# Patient Record
Sex: Male | Born: 1950 | Race: Black or African American | Hispanic: No | Smoking: Former smoker
Health system: Southern US, Community
[De-identification: ages and names within clinical notes are randomized; demographics above are authoritative.]

## PROBLEM LIST (undated history)

## (undated) DIAGNOSIS — M6282 Rhabdomyolysis: Secondary | ICD-10-CM

## (undated) DIAGNOSIS — S069X9A Unspecified intracranial injury with loss of consciousness of unspecified duration, initial encounter: Secondary | ICD-10-CM

## (undated) DIAGNOSIS — R55 Syncope and collapse: Secondary | ICD-10-CM

## (undated) DIAGNOSIS — I5189 Other ill-defined heart diseases: Secondary | ICD-10-CM

## (undated) DIAGNOSIS — I214 Non-ST elevation (NSTEMI) myocardial infarction: Secondary | ICD-10-CM

## (undated) DIAGNOSIS — S069XAA Unspecified intracranial injury with loss of consciousness status unknown, initial encounter: Secondary | ICD-10-CM

## (undated) DIAGNOSIS — E119 Type 2 diabetes mellitus without complications: Secondary | ICD-10-CM

## (undated) DIAGNOSIS — R569 Unspecified convulsions: Secondary | ICD-10-CM

## (undated) HISTORY — PX: CRANIOTOMY: SHX93

---

## 1998-06-08 ENCOUNTER — Emergency Department (HOSPITAL_COMMUNITY): Admission: EM | Admit: 1998-06-08 | Discharge: 1998-06-08 | Payer: Self-pay | Admitting: Emergency Medicine

## 1998-06-08 ENCOUNTER — Encounter: Payer: Self-pay | Admitting: Emergency Medicine

## 1998-08-02 ENCOUNTER — Encounter: Payer: Self-pay | Admitting: Emergency Medicine

## 1998-08-02 ENCOUNTER — Emergency Department (HOSPITAL_COMMUNITY): Admission: EM | Admit: 1998-08-02 | Discharge: 1998-08-02 | Payer: Self-pay | Admitting: Emergency Medicine

## 1998-09-12 ENCOUNTER — Emergency Department (HOSPITAL_COMMUNITY): Admission: EM | Admit: 1998-09-12 | Discharge: 1998-09-12 | Payer: Self-pay | Admitting: Emergency Medicine

## 1998-11-22 ENCOUNTER — Inpatient Hospital Stay (HOSPITAL_COMMUNITY): Admission: EM | Admit: 1998-11-22 | Discharge: 1999-01-15 | Payer: Self-pay | Admitting: Emergency Medicine

## 1998-11-22 ENCOUNTER — Encounter: Payer: Self-pay | Admitting: Emergency Medicine

## 1998-11-23 ENCOUNTER — Encounter: Payer: Self-pay | Admitting: Neurosurgery

## 1998-11-24 ENCOUNTER — Encounter: Payer: Self-pay | Admitting: Pulmonary Disease

## 1998-11-24 ENCOUNTER — Encounter: Payer: Self-pay | Admitting: Neurosurgery

## 1998-11-25 ENCOUNTER — Encounter: Payer: Self-pay | Admitting: Pulmonary Disease

## 1998-11-26 ENCOUNTER — Encounter: Payer: Self-pay | Admitting: Neurosurgery

## 1998-11-28 ENCOUNTER — Encounter: Payer: Self-pay | Admitting: Neurosurgery

## 1998-12-02 ENCOUNTER — Encounter: Payer: Self-pay | Admitting: Neurosurgery

## 1998-12-04 ENCOUNTER — Encounter: Payer: Self-pay | Admitting: Neurosurgery

## 1998-12-04 ENCOUNTER — Encounter: Payer: Self-pay | Admitting: Neurological Surgery

## 1998-12-04 ENCOUNTER — Encounter: Payer: Self-pay | Admitting: Pulmonary Disease

## 1998-12-05 ENCOUNTER — Encounter: Payer: Self-pay | Admitting: Pulmonary Disease

## 1998-12-05 ENCOUNTER — Encounter: Payer: Self-pay | Admitting: Neurosurgery

## 1998-12-06 ENCOUNTER — Encounter: Payer: Self-pay | Admitting: Critical Care Medicine

## 1998-12-07 ENCOUNTER — Encounter: Payer: Self-pay | Admitting: Critical Care Medicine

## 1998-12-08 ENCOUNTER — Encounter: Payer: Self-pay | Admitting: Neurosurgery

## 1998-12-09 ENCOUNTER — Encounter: Payer: Self-pay | Admitting: Pulmonary Disease

## 1998-12-11 ENCOUNTER — Encounter: Payer: Self-pay | Admitting: Critical Care Medicine

## 1998-12-17 ENCOUNTER — Encounter: Payer: Self-pay | Admitting: Neurosurgery

## 1998-12-25 ENCOUNTER — Encounter: Payer: Self-pay | Admitting: General Surgery

## 1999-01-04 ENCOUNTER — Encounter: Payer: Self-pay | Admitting: General Surgery

## 1999-01-08 ENCOUNTER — Encounter: Payer: Self-pay | Admitting: Neurosurgery

## 1999-01-10 ENCOUNTER — Encounter: Payer: Self-pay | Admitting: Neurosurgery

## 1999-01-15 ENCOUNTER — Inpatient Hospital Stay (HOSPITAL_COMMUNITY)
Admission: RE | Admit: 1999-01-15 | Discharge: 1999-03-05 | Payer: Self-pay | Admitting: Physical Medicine and Rehabilitation

## 1999-01-16 ENCOUNTER — Encounter: Payer: Self-pay | Admitting: Physical Medicine and Rehabilitation

## 1999-02-10 ENCOUNTER — Encounter: Payer: Self-pay | Admitting: Physical Medicine and Rehabilitation

## 1999-03-06 ENCOUNTER — Encounter
Admission: RE | Admit: 1999-03-06 | Discharge: 1999-05-02 | Payer: Self-pay | Admitting: Physical Medicine and Rehabilitation

## 1999-03-25 ENCOUNTER — Ambulatory Visit (HOSPITAL_COMMUNITY): Admission: RE | Admit: 1999-03-25 | Discharge: 1999-03-25 | Payer: Self-pay | Admitting: Family Medicine

## 1999-04-12 ENCOUNTER — Emergency Department (HOSPITAL_COMMUNITY): Admission: EM | Admit: 1999-04-12 | Discharge: 1999-04-12 | Payer: Self-pay | Admitting: Emergency Medicine

## 1999-05-01 ENCOUNTER — Encounter: Admission: RE | Admit: 1999-05-01 | Discharge: 1999-05-01 | Payer: Self-pay | Admitting: Neurosurgery

## 1999-05-11 ENCOUNTER — Encounter: Payer: Self-pay | Admitting: Emergency Medicine

## 1999-05-11 ENCOUNTER — Inpatient Hospital Stay (HOSPITAL_COMMUNITY): Admission: EM | Admit: 1999-05-11 | Discharge: 1999-05-14 | Payer: Self-pay | Admitting: Emergency Medicine

## 1999-05-12 ENCOUNTER — Encounter: Payer: Self-pay | Admitting: Family Medicine

## 1999-05-15 ENCOUNTER — Encounter: Admission: RE | Admit: 1999-05-15 | Discharge: 1999-05-15 | Payer: Self-pay | Admitting: Family Medicine

## 1999-06-15 ENCOUNTER — Encounter: Payer: Self-pay | Admitting: Emergency Medicine

## 1999-06-15 ENCOUNTER — Observation Stay (HOSPITAL_COMMUNITY): Admission: EM | Admit: 1999-06-15 | Discharge: 1999-06-16 | Payer: Self-pay | Admitting: Emergency Medicine

## 1999-07-02 ENCOUNTER — Encounter: Payer: Self-pay | Admitting: Family Medicine

## 1999-07-02 ENCOUNTER — Ambulatory Visit (HOSPITAL_COMMUNITY): Admission: RE | Admit: 1999-07-02 | Discharge: 1999-07-02 | Payer: Self-pay | Admitting: Family Medicine

## 2000-03-01 ENCOUNTER — Emergency Department (HOSPITAL_COMMUNITY): Admission: EM | Admit: 2000-03-01 | Discharge: 2000-03-01 | Payer: Self-pay | Admitting: Emergency Medicine

## 2000-03-01 ENCOUNTER — Encounter: Payer: Self-pay | Admitting: Emergency Medicine

## 2000-12-09 ENCOUNTER — Encounter: Payer: Self-pay | Admitting: Emergency Medicine

## 2000-12-09 ENCOUNTER — Emergency Department (HOSPITAL_COMMUNITY): Admission: EM | Admit: 2000-12-09 | Discharge: 2000-12-09 | Payer: Self-pay | Admitting: Emergency Medicine

## 2001-02-26 ENCOUNTER — Inpatient Hospital Stay (HOSPITAL_COMMUNITY): Admission: EM | Admit: 2001-02-26 | Discharge: 2001-03-09 | Payer: Self-pay | Admitting: Emergency Medicine

## 2001-03-03 ENCOUNTER — Encounter: Payer: Self-pay | Admitting: Nephrology

## 2001-03-07 ENCOUNTER — Encounter: Payer: Self-pay | Admitting: Nephrology

## 2001-05-18 ENCOUNTER — Inpatient Hospital Stay (HOSPITAL_COMMUNITY): Admission: EM | Admit: 2001-05-18 | Discharge: 2001-05-21 | Payer: Self-pay | Admitting: Emergency Medicine

## 2001-05-18 ENCOUNTER — Encounter: Payer: Self-pay | Admitting: Pediatrics

## 2001-05-18 ENCOUNTER — Encounter: Payer: Self-pay | Admitting: Emergency Medicine

## 2001-06-21 ENCOUNTER — Emergency Department (HOSPITAL_COMMUNITY): Admission: EM | Admit: 2001-06-21 | Discharge: 2001-06-21 | Payer: Self-pay

## 2001-08-03 ENCOUNTER — Inpatient Hospital Stay (HOSPITAL_COMMUNITY): Admission: EM | Admit: 2001-08-03 | Discharge: 2001-08-11 | Payer: Self-pay | Admitting: *Deleted

## 2001-08-03 ENCOUNTER — Encounter: Payer: Self-pay | Admitting: *Deleted

## 2001-08-04 ENCOUNTER — Encounter: Payer: Self-pay | Admitting: Neurology

## 2001-08-05 ENCOUNTER — Encounter: Payer: Self-pay | Admitting: Neurology

## 2001-08-09 ENCOUNTER — Encounter: Payer: Self-pay | Admitting: Nephrology

## 2001-09-20 ENCOUNTER — Emergency Department (HOSPITAL_COMMUNITY): Admission: EM | Admit: 2001-09-20 | Discharge: 2001-09-21 | Payer: Self-pay | Admitting: Emergency Medicine

## 2001-09-27 ENCOUNTER — Encounter: Payer: Self-pay | Admitting: Emergency Medicine

## 2001-09-27 ENCOUNTER — Emergency Department (HOSPITAL_COMMUNITY): Admission: EM | Admit: 2001-09-27 | Discharge: 2001-09-27 | Payer: Self-pay | Admitting: Emergency Medicine

## 2002-04-05 ENCOUNTER — Inpatient Hospital Stay (HOSPITAL_COMMUNITY): Admission: EM | Admit: 2002-04-05 | Discharge: 2002-04-12 | Payer: Self-pay | Admitting: Emergency Medicine

## 2002-04-05 ENCOUNTER — Encounter: Payer: Self-pay | Admitting: Emergency Medicine

## 2002-04-06 ENCOUNTER — Encounter: Payer: Self-pay | Admitting: Nephrology

## 2003-02-18 ENCOUNTER — Emergency Department (HOSPITAL_COMMUNITY): Admission: EM | Admit: 2003-02-18 | Discharge: 2003-02-18 | Payer: Self-pay | Admitting: Emergency Medicine

## 2003-02-18 ENCOUNTER — Encounter: Payer: Self-pay | Admitting: Emergency Medicine

## 2005-10-09 ENCOUNTER — Emergency Department (HOSPITAL_COMMUNITY): Admission: EM | Admit: 2005-10-09 | Discharge: 2005-10-09 | Payer: Self-pay | Admitting: Emergency Medicine

## 2005-12-31 ENCOUNTER — Emergency Department (HOSPITAL_COMMUNITY): Admission: EM | Admit: 2005-12-31 | Discharge: 2005-12-31 | Payer: Self-pay | Admitting: Emergency Medicine

## 2006-06-23 ENCOUNTER — Emergency Department (HOSPITAL_COMMUNITY): Admission: EM | Admit: 2006-06-23 | Discharge: 2006-06-24 | Payer: Self-pay | Admitting: Emergency Medicine

## 2006-07-01 ENCOUNTER — Ambulatory Visit (HOSPITAL_BASED_OUTPATIENT_CLINIC_OR_DEPARTMENT_OTHER): Admission: RE | Admit: 2006-07-01 | Discharge: 2006-07-01 | Payer: Self-pay | Admitting: Orthopedic Surgery

## 2006-09-30 ENCOUNTER — Encounter: Admission: RE | Admit: 2006-09-30 | Discharge: 2006-12-16 | Payer: Self-pay | Admitting: Orthopedic Surgery

## 2007-03-04 ENCOUNTER — Inpatient Hospital Stay (HOSPITAL_COMMUNITY): Admission: EM | Admit: 2007-03-04 | Discharge: 2007-03-07 | Payer: Self-pay | Admitting: Emergency Medicine

## 2007-03-04 ENCOUNTER — Ambulatory Visit: Payer: Self-pay | Admitting: Family Medicine

## 2007-07-05 ENCOUNTER — Ambulatory Visit (HOSPITAL_COMMUNITY): Admission: RE | Admit: 2007-07-05 | Discharge: 2007-07-05 | Payer: Self-pay | Admitting: Oral Surgery

## 2008-03-01 ENCOUNTER — Emergency Department (HOSPITAL_COMMUNITY): Admission: EM | Admit: 2008-03-01 | Discharge: 2008-03-01 | Payer: Self-pay | Admitting: Family Medicine

## 2008-06-04 ENCOUNTER — Ambulatory Visit: Payer: Self-pay | Admitting: Internal Medicine

## 2008-06-04 ENCOUNTER — Inpatient Hospital Stay (HOSPITAL_COMMUNITY): Admission: EM | Admit: 2008-06-04 | Discharge: 2008-06-06 | Payer: Self-pay | Admitting: Emergency Medicine

## 2008-06-10 ENCOUNTER — Observation Stay (HOSPITAL_COMMUNITY): Admission: EM | Admit: 2008-06-10 | Discharge: 2008-06-11 | Payer: Self-pay | Admitting: Emergency Medicine

## 2008-06-10 ENCOUNTER — Ambulatory Visit: Payer: Self-pay | Admitting: Internal Medicine

## 2008-06-26 ENCOUNTER — Inpatient Hospital Stay (HOSPITAL_COMMUNITY): Admission: EM | Admit: 2008-06-26 | Discharge: 2008-07-03 | Payer: Self-pay | Admitting: Emergency Medicine

## 2008-12-06 ENCOUNTER — Emergency Department (HOSPITAL_COMMUNITY): Admission: EM | Admit: 2008-12-06 | Discharge: 2008-12-06 | Payer: Self-pay | Admitting: Emergency Medicine

## 2009-05-11 IMAGING — CR DG CHEST 1V PORT
1 series · 1 of 1 positions shown · non-contrast
Comparison: 06/27/2008

CLINICAL DATA: PICC placement

PORTABLE CHEST - 1 VIEW

[view not recorded]
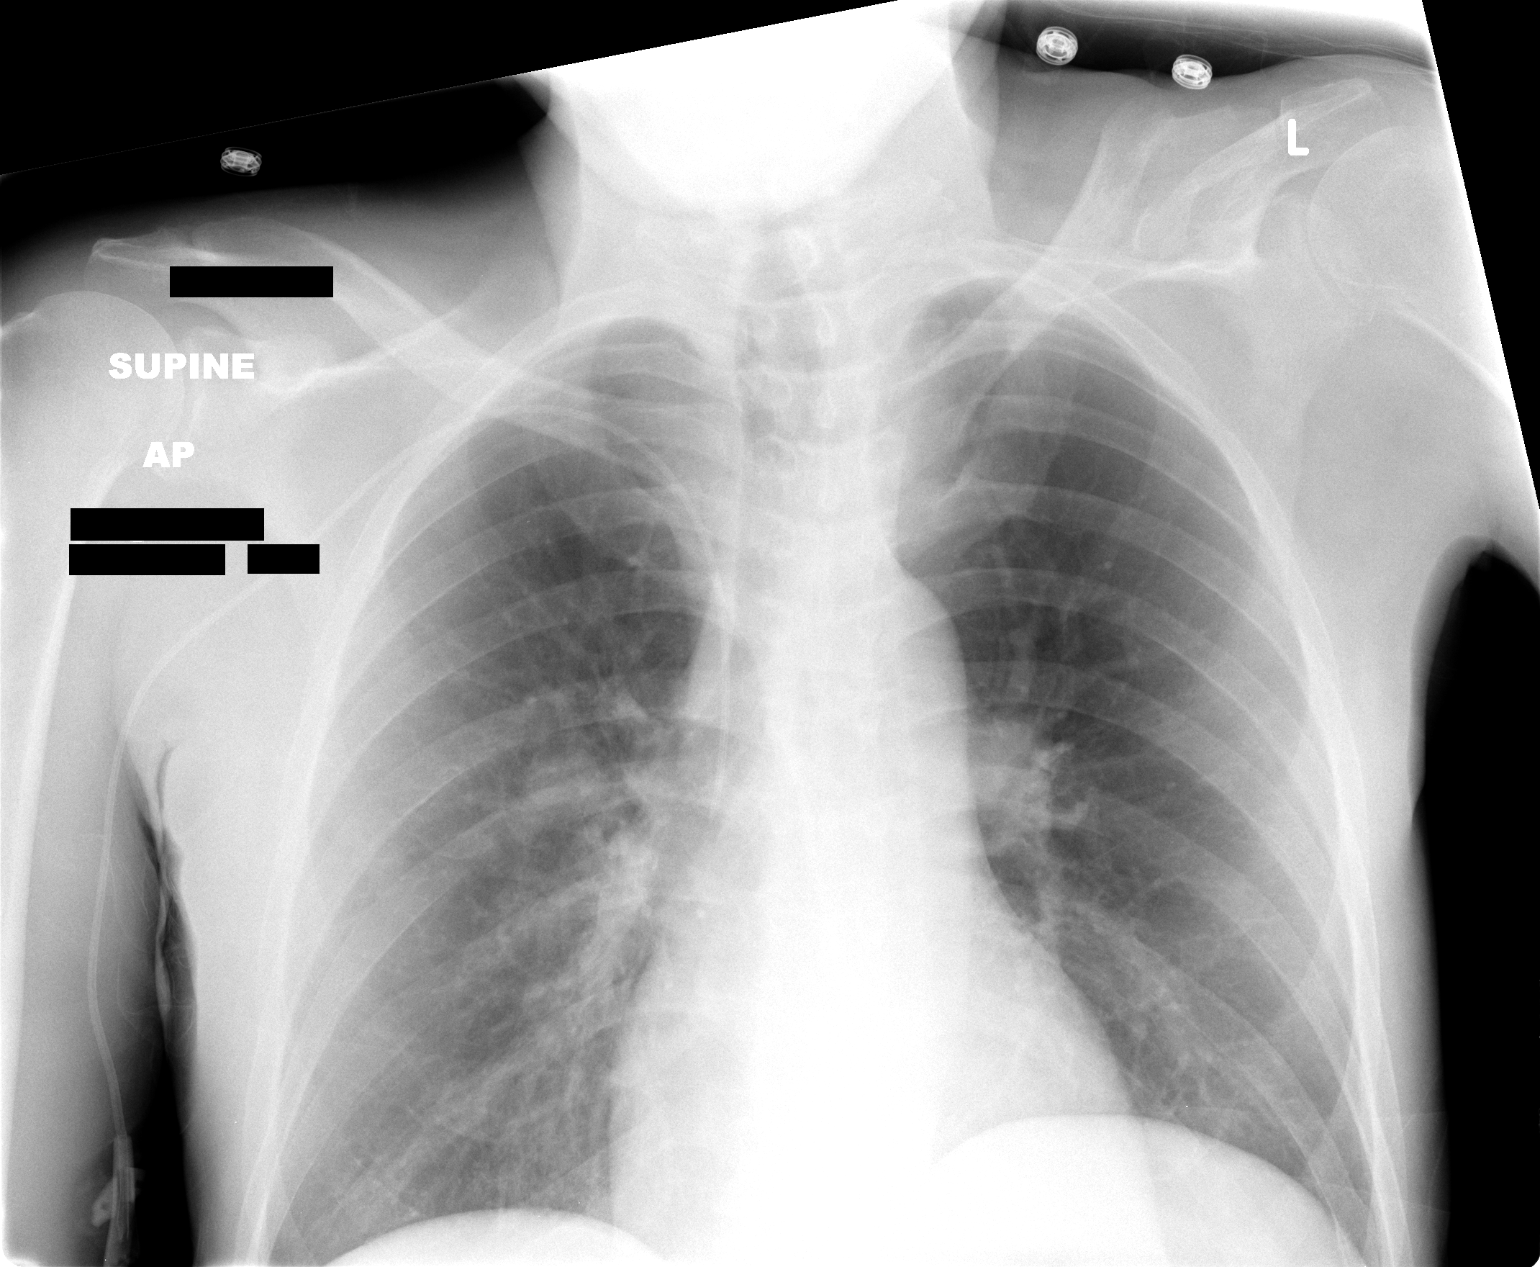

[1 of 1 positions shown; findings below may reference images not displayed]

FINDINGS: The right extremity PICC tip is in the SVC in its
midportion.  Lungs are clear.  Heart is normal in size.
IMPRESSION: Right PICC tip in the SVC.

## 2009-05-11 IMAGING — RF DG FLUORO GUIDE NDL PLC/BX
1 series · 1 of 1 positions shown · non-contrast
Comparison: none

CLINICAL DATA: Seizures

DIAGNOSTIC LUMBAR PUNCTURE UNDER FLUOROSCOPIC GUIDANCE
TECHNIQUE: Informed consent was obtained from the patient prior to
the procedure, including potential complications of headache,
allergy, and pain.   With the patient prone, the lower back was
prepped with Betadine.  1% Lidocaine was used for local anesthesia.
Lumbar puncture was performed at the L3-L4 level using a 20 gauge
needle with return of clear CSF with an opening pressure of 15 cm
water.   9 cc of CSF were obtained for laboratory studies.  The
patient tolerated the procedure well and there were no apparent
complications. Fluoro time equal 0.7 minutes

[Series 1: run · 1 of 1 slices shown]
[im 1/1]
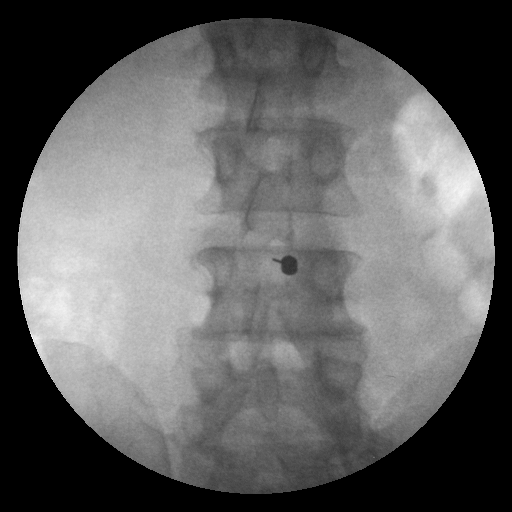

[1 of 1 positions shown; findings below may reference images not displayed]

IMPRESSION: Successful lumbar puncture.

## 2009-05-12 IMAGING — CR DG CHEST 2V
2 series · 2 of 2 positions shown · non-contrast
Comparison: 06/28/2008

CLINICAL DATA: 56-year-old male seizure activity, abnormal physical
exam, evaluate for pneumonia

CHEST - 2 VIEW

[w chest lat]
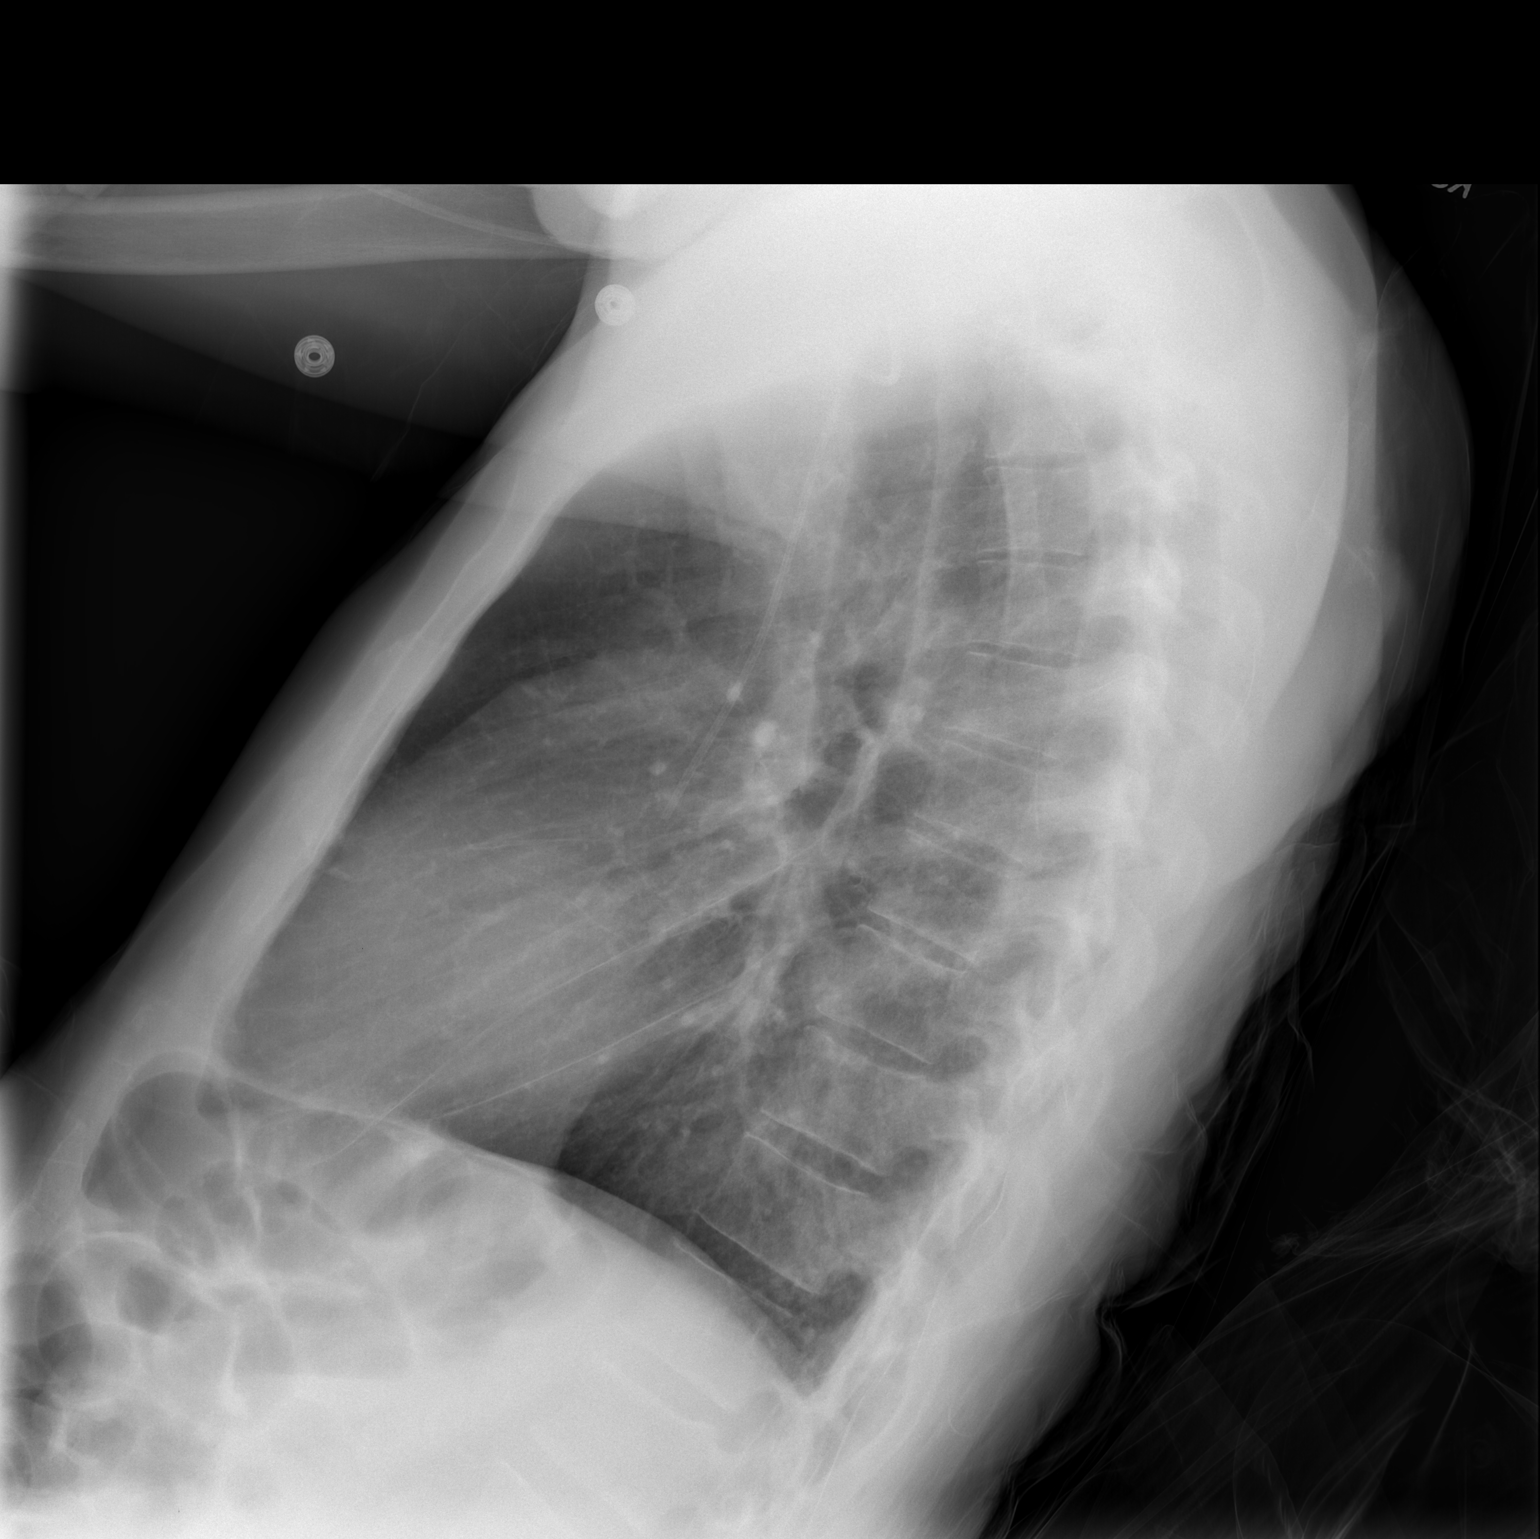

[view not recorded]
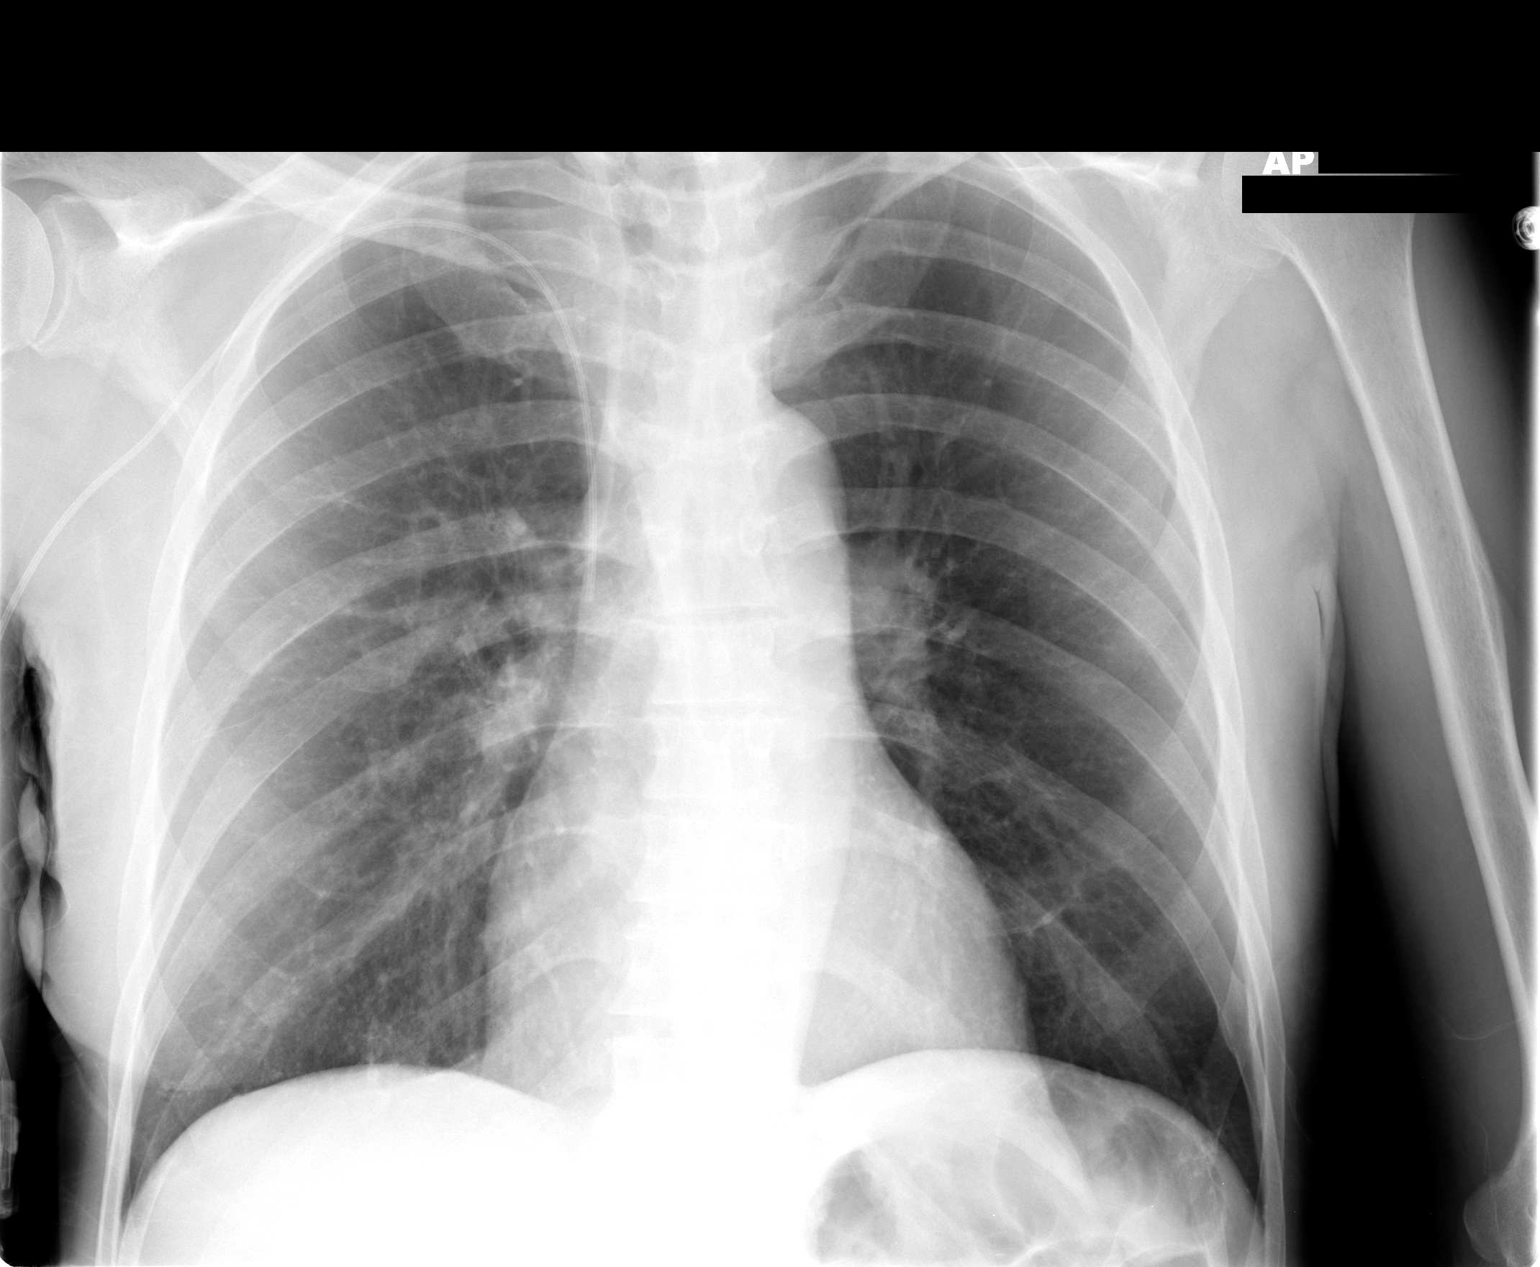

[2 of 2 positions shown; findings below may reference images not displayed]

FINDINGS: Right PICC line tip in the SVC.  Normal heart size and
vascularity.  Negative for focal pneumonia, edema, effusion or
pneumothorax.  Trachea is midline.  Healed left clavicle fracture
with deformity.
IMPRESSION: Stable exam.  No acute chest process.

## 2009-06-01 ENCOUNTER — Inpatient Hospital Stay (HOSPITAL_COMMUNITY): Admission: EM | Admit: 2009-06-01 | Discharge: 2009-06-03 | Payer: Self-pay | Admitting: Emergency Medicine

## 2009-06-05 ENCOUNTER — Inpatient Hospital Stay (HOSPITAL_COMMUNITY): Admission: AD | Admit: 2009-06-05 | Discharge: 2009-06-07 | Payer: Self-pay | Admitting: Internal Medicine

## 2009-06-05 ENCOUNTER — Encounter (HOSPITAL_COMMUNITY): Payer: Self-pay | Admitting: Emergency Medicine

## 2009-06-06 ENCOUNTER — Encounter (INDEPENDENT_AMBULATORY_CARE_PROVIDER_SITE_OTHER): Payer: Self-pay | Admitting: Family Medicine

## 2009-09-22 ENCOUNTER — Inpatient Hospital Stay (HOSPITAL_COMMUNITY): Admission: EM | Admit: 2009-09-22 | Discharge: 2009-09-27 | Payer: Self-pay | Admitting: Emergency Medicine

## 2009-11-16 ENCOUNTER — Inpatient Hospital Stay (HOSPITAL_COMMUNITY): Admission: EM | Admit: 2009-11-16 | Discharge: 2009-11-19 | Payer: Self-pay | Admitting: Emergency Medicine

## 2010-04-20 ENCOUNTER — Inpatient Hospital Stay (HOSPITAL_COMMUNITY): Admission: EM | Admit: 2010-04-20 | Discharge: 2010-04-23 | Payer: Self-pay | Admitting: Emergency Medicine

## 2010-10-02 LAB — URINE MICROSCOPIC-ADD ON

## 2010-10-02 LAB — POCT CARDIAC MARKERS
Myoglobin, poc: 32.1 ng/mL (ref 12–200)
Troponin i, poc: <0.05 ng/mL (ref 0.00–0.09)

## 2010-10-02 LAB — LIPID PANEL
Cholesterol: 102 mg/dL (ref 0–200)
HDL: 39 mg/dL — ABNORMAL LOW (ref >39)
LDL Cholesterol: 48 mg/dL (ref 0–99)
Total CHOL/HDL Ratio: 2.6 RATIO
Triglycerides: 74 mg/dL (ref <150)

## 2010-10-02 LAB — GLUCOSE, CAPILLARY
Glucose-Capillary: 102 mg/dL — ABNORMAL HIGH (ref 70–99)
Glucose-Capillary: 113 mg/dL — ABNORMAL HIGH (ref 70–99)
Glucose-Capillary: 131 mg/dL — ABNORMAL HIGH (ref 70–99)
Glucose-Capillary: 137 mg/dL — ABNORMAL HIGH (ref 70–99)
Glucose-Capillary: 139 mg/dL — ABNORMAL HIGH (ref 70–99)
Glucose-Capillary: 171 mg/dL — ABNORMAL HIGH (ref 70–99)
Glucose-Capillary: 177 mg/dL — ABNORMAL HIGH (ref 70–99)
Glucose-Capillary: 41 mg/dL — CL (ref 70–99)

## 2010-10-02 LAB — BASIC METABOLIC PANEL
BUN: 8 mg/dL (ref 6–23)
BUN: 9 mg/dL (ref 6–23)
CO2: 24 mEq/L (ref 19–32)
CO2: 27 mEq/L (ref 19–32)
CO2: 27 mEq/L (ref 19–32)
Calcium: 8.5 mg/dL (ref 8.4–10.5)
Calcium: 8.6 mg/dL (ref 8.4–10.5)
Chloride: 107 mEq/L (ref 96–112)
Chloride: 109 mEq/L (ref 96–112)
Creatinine, Ser: 0.77 mg/dL (ref 0.4–1.5)
GFR calc Af Amer: >60 mL/min (ref >60)
GFR calc non Af Amer: >60 mL/min (ref >60)
Glucose, Bld: 134 mg/dL — ABNORMAL HIGH (ref 70–99)
Glucose, Bld: 169 mg/dL — ABNORMAL HIGH (ref 70–99)
Glucose, Bld: 177 mg/dL — ABNORMAL HIGH (ref 70–99)
Sodium: 139 mEq/L (ref 135–145)

## 2010-10-02 LAB — CBC
HCT: 30.5 % — ABNORMAL LOW (ref 39.0–52.0)
HCT: 32.2 % — ABNORMAL LOW (ref 39.0–52.0)
HCT: 38.1 % — ABNORMAL LOW (ref 39.0–52.0)
Hemoglobin: 10.4 g/dL — ABNORMAL LOW (ref 13.0–17.0)
MCH: 29.5 pg (ref 26.0–34.0)
MCH: 29.7 pg (ref 26.0–34.0)
MCH: 29.8 pg (ref 26.0–34.0)
MCH: 29.8 pg (ref 26.0–34.0)
MCHC: 32.4 g/dL (ref 30.0–36.0)
MCHC: 33.4 g/dL (ref 30.0–36.0)
MCHC: 33.5 g/dL (ref 30.0–36.0)
MCV: 88.4 fL (ref 78.0–100.0)
MCV: 89.1 fL (ref 78.0–100.0)
MCV: 91.2 fL (ref 78.0–100.0)
Platelets: 468 10*3/uL — ABNORMAL HIGH (ref 150–400)
Platelets: 540 10*3/uL — ABNORMAL HIGH (ref 150–400)
RBC: 3.53 MIL/uL — ABNORMAL LOW (ref 4.22–5.81)
RDW: 13.9 % (ref 11.5–15.5)
RDW: 14.5 % (ref 11.5–15.5)
RDW: 14.5 % (ref 11.5–15.5)
WBC: 12.9 10*3/uL — ABNORMAL HIGH (ref 4.0–10.5)

## 2010-10-02 LAB — URINALYSIS, ROUTINE W REFLEX MICROSCOPIC
Bilirubin Urine: NEGATIVE
Ketones, ur: NEGATIVE mg/dL
Nitrite: NEGATIVE
Specific Gravity, Urine: 1.005 (ref 1.005–1.030)
pH: 6 (ref 5.0–8.0)

## 2010-10-02 LAB — CARBAMAZEPINE LEVEL, TOTAL: Carbamazepine Lvl: <2 ug/mL — ABNORMAL LOW (ref 4.0–12.0)

## 2010-10-02 LAB — TROPONIN I
Troponin I: 0.01 ng/mL (ref 0.00–0.06)
Troponin I: 0.01 ng/mL (ref 0.00–0.06)

## 2010-10-02 LAB — PHENYTOIN LEVEL, TOTAL: Phenytoin Lvl: 31.4 ug/mL (ref 10.0–20.0)

## 2010-10-02 LAB — RAPID URINE DRUG SCREEN, HOSP PERFORMED
Barbiturates: NOT DETECTED
Benzodiazepines: NOT DETECTED
Cocaine: NOT DETECTED
Opiates: NOT DETECTED

## 2010-10-02 LAB — CK TOTAL AND CKMB (NOT AT ARMC)
CK, MB: 0.7 ng/mL (ref 0.3–4.0)
CK, MB: 0.7 ng/mL (ref 0.3–4.0)
Total CK: 23 U/L (ref 7–232)
Total CK: 36 U/L (ref 7–232)

## 2010-10-02 LAB — COMPREHENSIVE METABOLIC PANEL
Albumin: 3.6 g/dL (ref 3.5–5.2)
Alkaline Phosphatase: 116 U/L (ref 39–117)
BUN: 6 mg/dL (ref 6–23)
Chloride: 104 mEq/L (ref 96–112)
Creatinine, Ser: 0.72 mg/dL (ref 0.4–1.5)
Glucose, Bld: 46 mg/dL — ABNORMAL LOW (ref 70–99)
Total Bilirubin: 0.3 mg/dL (ref 0.3–1.2)
Total Protein: 8.7 g/dL — ABNORMAL HIGH (ref 6.0–8.3)

## 2010-10-02 LAB — URINE CULTURE: Colony Count: >=100000

## 2010-10-02 LAB — PROTIME-INR: Prothrombin Time: 13.7 seconds (ref 11.6–15.2)

## 2010-10-02 LAB — CORTISOL: Cortisol, Plasma: 8.1 ug/dL

## 2010-10-07 LAB — BASIC METABOLIC PANEL
CO2: 31 mEq/L (ref 19–32)
Chloride: 106 mEq/L (ref 96–112)
GFR calc Af Amer: >60 mL/min (ref >60)
Glucose, Bld: 92 mg/dL (ref 70–99)
Potassium: 3.5 mEq/L (ref 3.5–5.1)
Sodium: 141 mEq/L (ref 135–145)

## 2010-10-07 LAB — DIFFERENTIAL
Basophils Absolute: 0 10*3/uL (ref 0.0–0.1)
Basophils Absolute: 0 10*3/uL (ref 0.0–0.1)
Basophils Relative: 1 % (ref 0–1)
Eosinophils Absolute: 0.3 10*3/uL (ref 0.0–0.7)
Eosinophils Relative: 3 % (ref 0–5)
Lymphocytes Relative: 30 % (ref 12–46)
Lymphs Abs: 1.8 10*3/uL (ref 0.7–4.0)
Monocytes Absolute: 0.3 10*3/uL (ref 0.1–1.0)
Monocytes Relative: 8 % (ref 3–12)
Neutro Abs: 2.2 10*3/uL (ref 1.7–7.7)
Neutrophils Relative %: 48 % (ref 43–77)

## 2010-10-07 LAB — URINALYSIS, ROUTINE W REFLEX MICROSCOPIC
Bilirubin Urine: NEGATIVE
Nitrite: NEGATIVE
Specific Gravity, Urine: 1.016 (ref 1.005–1.030)
Urobilinogen, UA: 0.2 mg/dL (ref 0.0–1.0)

## 2010-10-07 LAB — COMPREHENSIVE METABOLIC PANEL
ALT: 68 U/L — ABNORMAL HIGH (ref 0–53)
AST: 82 U/L — ABNORMAL HIGH (ref 0–37)
Albumin: 3 g/dL — ABNORMAL LOW (ref 3.5–5.2)
Alkaline Phosphatase: 108 U/L (ref 39–117)
BUN: 7 mg/dL (ref 6–23)
BUN: 8 mg/dL (ref 6–23)
CO2: 31 mEq/L (ref 19–32)
Calcium: 8.7 mg/dL (ref 8.4–10.5)
Chloride: 104 mEq/L (ref 96–112)
Creatinine, Ser: 0.64 mg/dL (ref 0.4–1.5)
GFR calc Af Amer: >60 mL/min (ref >60)
Glucose, Bld: 187 mg/dL — ABNORMAL HIGH (ref 70–99)
Potassium: 3.9 mEq/L (ref 3.5–5.1)
Sodium: 142 mEq/L (ref 135–145)
Total Bilirubin: 0.3 mg/dL (ref 0.3–1.2)
Total Protein: 6.2 g/dL (ref 6.0–8.3)
Total Protein: 7.1 g/dL (ref 6.0–8.3)

## 2010-10-07 LAB — POCT I-STAT, CHEM 8
BUN: 14 mg/dL (ref 6–23)
Chloride: 107 mEq/L (ref 96–112)
Glucose, Bld: 115 mg/dL — ABNORMAL HIGH (ref 70–99)
HCT: 45 % (ref 39.0–52.0)
Potassium: 4.3 mEq/L (ref 3.5–5.1)

## 2010-10-07 LAB — CBC
HCT: 35.7 % — ABNORMAL LOW (ref 39.0–52.0)
HCT: 39.6 % (ref 39.0–52.0)
HCT: 37.2 % — ABNORMAL LOW (ref 39.0–52.0)
Hemoglobin: 12.9 g/dL — ABNORMAL LOW (ref 13.0–17.0)
Hemoglobin: 12.1 g/dL — ABNORMAL LOW (ref 13.0–17.0)
MCV: 91.1 fL (ref 78.0–100.0)
RBC: 4.33 MIL/uL (ref 4.22–5.81)
RDW: 13.9 % (ref 11.5–15.5)
RDW: 14 % (ref 11.5–15.5)
RDW: 14 % (ref 11.5–15.5)
WBC: 4.6 10*3/uL (ref 4.0–10.5)

## 2010-10-07 LAB — GLUCOSE, CAPILLARY
Glucose-Capillary: 104 mg/dL — ABNORMAL HIGH (ref 70–99)
Glucose-Capillary: 106 mg/dL — ABNORMAL HIGH (ref 70–99)
Glucose-Capillary: 107 mg/dL — ABNORMAL HIGH (ref 70–99)
Glucose-Capillary: 110 mg/dL — ABNORMAL HIGH (ref 70–99)
Glucose-Capillary: 113 mg/dL — ABNORMAL HIGH (ref 70–99)
Glucose-Capillary: 122 mg/dL — ABNORMAL HIGH (ref 70–99)
Glucose-Capillary: 122 mg/dL — ABNORMAL HIGH (ref 70–99)
Glucose-Capillary: 89 mg/dL (ref 70–99)
Glucose-Capillary: 92 mg/dL (ref 70–99)
Glucose-Capillary: 143 mg/dL — ABNORMAL HIGH (ref 70–99)
Glucose-Capillary: 185 mg/dL — ABNORMAL HIGH (ref 70–99)

## 2010-10-07 LAB — RAPID URINE DRUG SCREEN, HOSP PERFORMED
Barbiturates: NOT DETECTED
Opiates: NOT DETECTED
Tetrahydrocannabinol: NOT DETECTED

## 2010-10-07 LAB — TSH: TSH: 1.66 u[IU]/mL (ref 0.350–4.500)

## 2010-10-07 LAB — HEMOGLOBIN A1C: Hgb A1c MFr Bld: 5.5 % (ref <5.7)

## 2010-10-07 LAB — MAGNESIUM: Magnesium: 2.2 mg/dL (ref 1.5–2.5)

## 2010-10-07 LAB — PHOSPHORUS: Phosphorus: 2.6 mg/dL (ref 2.3–4.6)

## 2010-10-13 LAB — GLUCOSE, CAPILLARY
Glucose-Capillary: 115 mg/dL — ABNORMAL HIGH (ref 70–99)
Glucose-Capillary: 119 mg/dL — ABNORMAL HIGH (ref 70–99)
Glucose-Capillary: 153 mg/dL — ABNORMAL HIGH (ref 70–99)
Glucose-Capillary: 187 mg/dL — ABNORMAL HIGH (ref 70–99)
Glucose-Capillary: 75 mg/dL (ref 70–99)
Glucose-Capillary: 78 mg/dL (ref 70–99)
Glucose-Capillary: 85 mg/dL (ref 70–99)
Glucose-Capillary: 96 mg/dL (ref 70–99)
Glucose-Capillary: 97 mg/dL (ref 70–99)
Glucose-Capillary: 99 mg/dL (ref 70–99)

## 2010-10-13 LAB — BASIC METABOLIC PANEL
CO2: 31 mEq/L (ref 19–32)
Calcium: 9.1 mg/dL (ref 8.4–10.5)
Creatinine, Ser: 0.61 mg/dL (ref 0.4–1.5)
GFR calc Af Amer: >60 mL/min (ref >60)

## 2010-10-13 LAB — CBC
Platelets: 140 10*3/uL — ABNORMAL LOW (ref 150–400)
RDW: 13.7 % (ref 11.5–15.5)

## 2010-10-13 LAB — POCT I-STAT, CHEM 8
Chloride: 105 mEq/L (ref 96–112)
HCT: 40 % (ref 39.0–52.0)
Potassium: 3.6 mEq/L (ref 3.5–5.1)

## 2010-10-22 LAB — COMPREHENSIVE METABOLIC PANEL
ALT: 89 U/L — ABNORMAL HIGH (ref 0–53)
ALT: 59 U/L — ABNORMAL HIGH (ref 0–53)
ALT: 77 U/L — ABNORMAL HIGH (ref 0–53)
AST: 80 U/L — ABNORMAL HIGH (ref 0–37)
AST: 44 U/L — ABNORMAL HIGH (ref 0–37)
Albumin: 3.4 g/dL — ABNORMAL LOW (ref 3.5–5.2)
Albumin: 3.5 g/dL (ref 3.5–5.2)
Albumin: 3.9 g/dL (ref 3.5–5.2)
Alkaline Phosphatase: 46 U/L (ref 39–117)
Alkaline Phosphatase: 46 U/L (ref 39–117)
Alkaline Phosphatase: 56 U/L (ref 39–117)
BUN: 7 mg/dL (ref 6–23)
BUN: 8 mg/dL (ref 6–23)
CO2: 18 mEq/L — ABNORMAL LOW (ref 19–32)
Calcium: 10.1 mg/dL (ref 8.4–10.5)
Chloride: 104 mEq/L (ref 96–112)
Chloride: 94 mEq/L — ABNORMAL LOW (ref 96–112)
Creatinine, Ser: 0.96 mg/dL (ref 0.4–1.5)
GFR calc Af Amer: >60 mL/min (ref >60)
GFR calc Af Amer: >60 mL/min (ref >60)
GFR calc non Af Amer: >60 mL/min (ref >60)
Potassium: 3.5 mEq/L (ref 3.5–5.1)
Potassium: 3.8 mEq/L (ref 3.5–5.1)
Potassium: 4.4 mEq/L (ref 3.5–5.1)
Sodium: 132 mEq/L — ABNORMAL LOW (ref 135–145)
Sodium: 136 mEq/L (ref 135–145)
Sodium: 127 mEq/L — ABNORMAL LOW (ref 135–145)
Total Bilirubin: 0.7 mg/dL (ref 0.3–1.2)
Total Bilirubin: 0.7 mg/dL (ref 0.3–1.2)
Total Protein: 8.3 g/dL (ref 6.0–8.3)
Total Protein: 6.8 g/dL (ref 6.0–8.3)

## 2010-10-22 LAB — DIFFERENTIAL
Eosinophils Absolute: 0 10*3/uL (ref 0.0–0.7)
Eosinophils Relative: 0 % (ref 0–5)
Eosinophils Relative: 7 % — ABNORMAL HIGH (ref 0–5)
Lymphocytes Relative: 7 % — ABNORMAL LOW (ref 12–46)
Lymphocytes Relative: 28 % (ref 12–46)
Lymphs Abs: 1.1 10*3/uL (ref 0.7–4.0)
Lymphs Abs: 1.7 10*3/uL (ref 0.7–4.0)
Monocytes Relative: 4 % (ref 3–12)
Monocytes Relative: 7 % (ref 3–12)
Neutro Abs: 3.6 10*3/uL (ref 1.7–7.7)
Neutrophils Relative %: 89 % — ABNORMAL HIGH (ref 43–77)

## 2010-10-22 LAB — URINALYSIS, ROUTINE W REFLEX MICROSCOPIC
Bilirubin Urine: NEGATIVE
Bilirubin Urine: NEGATIVE
Glucose, UA: NEGATIVE mg/dL
Hgb urine dipstick: NEGATIVE
Nitrite: NEGATIVE
Specific Gravity, Urine: 1.014 (ref 1.005–1.030)
Specific Gravity, Urine: 1.009 (ref 1.005–1.030)
Urobilinogen, UA: 1 mg/dL (ref 0.0–1.0)
pH: 7.5 (ref 5.0–8.0)
pH: 7 (ref 5.0–8.0)

## 2010-10-22 LAB — CBC
HCT: 38.3 % — ABNORMAL LOW (ref 39.0–52.0)
Hemoglobin: 11.4 g/dL — ABNORMAL LOW (ref 13.0–17.0)
Hemoglobin: 12.9 g/dL — ABNORMAL LOW (ref 13.0–17.0)
Platelets: 166 10*3/uL (ref 150–400)
Platelets: 145 10*3/uL — ABNORMAL LOW (ref 150–400)
RBC: 4.4 MIL/uL (ref 4.22–5.81)
RDW: 13.7 % (ref 11.5–15.5)
WBC: 14.7 10*3/uL — ABNORMAL HIGH (ref 4.0–10.5)
WBC: 6.2 10*3/uL (ref 4.0–10.5)

## 2010-10-22 LAB — GLUCOSE, CAPILLARY
Glucose-Capillary: 87 mg/dL (ref 70–99)
Glucose-Capillary: 95 mg/dL (ref 70–99)
Glucose-Capillary: 126 mg/dL — ABNORMAL HIGH (ref 70–99)
Glucose-Capillary: 143 mg/dL — ABNORMAL HIGH (ref 70–99)
Glucose-Capillary: 158 mg/dL — ABNORMAL HIGH (ref 70–99)
Glucose-Capillary: 182 mg/dL — ABNORMAL HIGH (ref 70–99)
Glucose-Capillary: 87 mg/dL (ref 70–99)
Glucose-Capillary: 92 mg/dL (ref 70–99)
Glucose-Capillary: 96 mg/dL (ref 70–99)

## 2010-10-22 LAB — TROPONIN I
Troponin I: 0.06 ng/mL (ref 0.00–0.06)
Troponin I: 0.04 ng/mL (ref 0.00–0.06)

## 2010-10-22 LAB — POCT I-STAT, CHEM 8
HCT: 43 % (ref 39.0–52.0)
Hemoglobin: 14.6 g/dL (ref 13.0–17.0)
Potassium: 4.7 mEq/L (ref 3.5–5.1)
Sodium: 123 mEq/L — ABNORMAL LOW (ref 135–145)
TCO2: 22 mmol/L (ref 0–100)

## 2010-10-22 LAB — BASIC METABOLIC PANEL
BUN: 6 mg/dL (ref 6–23)
BUN: 7 mg/dL (ref 6–23)
CO2: 26 mEq/L (ref 19–32)
Calcium: 9.1 mg/dL (ref 8.4–10.5)
Calcium: 9.1 mg/dL (ref 8.4–10.5)
Calcium: 9.3 mg/dL (ref 8.4–10.5)
Chloride: 100 mEq/L (ref 96–112)
Creatinine, Ser: 0.69 mg/dL (ref 0.4–1.5)
Creatinine, Ser: 0.75 mg/dL (ref 0.4–1.5)
GFR calc Af Amer: >60 mL/min (ref >60)
GFR calc non Af Amer: >60 mL/min (ref >60)
GFR calc non Af Amer: >60 mL/min (ref >60)
Glucose, Bld: 146 mg/dL — ABNORMAL HIGH (ref 70–99)
Glucose, Bld: 75 mg/dL (ref 70–99)
Potassium: 4.8 mEq/L (ref 3.5–5.1)
Sodium: 131 mEq/L — ABNORMAL LOW (ref 135–145)

## 2010-10-22 LAB — CK TOTAL AND CKMB (NOT AT ARMC)
CK, MB: 0.9 ng/mL (ref 0.3–4.0)
CK, MB: 0.7 ng/mL (ref 0.3–4.0)
CK, MB: 0.8 ng/mL (ref 0.3–4.0)
CK, MB: 1.5 ng/mL (ref 0.3–4.0)
Relative Index: 0.3 (ref 0.0–2.5)
Relative Index: 0.4 (ref 0.0–2.5)
Total CK: 107 U/L (ref 7–232)
Total CK: 209 U/L (ref 7–232)
Total CK: 144 U/L (ref 7–232)

## 2010-10-22 LAB — PHENYTOIN LEVEL, TOTAL: Phenytoin Lvl: 3.4 ug/mL — ABNORMAL LOW (ref 10.0–20.0)

## 2010-10-22 LAB — CARDIAC PANEL(CRET KIN+CKTOT+MB+TROPI)
CK, MB: 1.5 ng/mL (ref 0.3–4.0)
CK, MB: 2.7 ng/mL (ref 0.3–4.0)
Relative Index: 0.6 (ref 0.0–2.5)
Relative Index: 0.9 (ref 0.0–2.5)
Total CK: 260 U/L — ABNORMAL HIGH (ref 7–232)
Total CK: 305 U/L — ABNORMAL HIGH (ref 7–232)

## 2010-10-22 LAB — PHENYTOIN LEVEL, FREE AND TOTAL
Phenytoin, Free: 1.75 ug/mL (ref 1.00–2.00)
Phenytoin, Free: 1.65 ug/mL (ref 1.00–2.00)
Phenytoin, Total: 21 ug/mL — ABNORMAL HIGH (ref 10.0–20.0)

## 2010-10-22 LAB — RAPID URINE DRUG SCREEN, HOSP PERFORMED
Amphetamines: NOT DETECTED
Opiates: NOT DETECTED
Tetrahydrocannabinol: NOT DETECTED

## 2010-10-22 LAB — URINE CULTURE: Colony Count: 80000

## 2010-10-22 LAB — ETHANOL: Alcohol, Ethyl (B): <5 mg/dL (ref 0–10)

## 2010-10-22 LAB — HEMOGLOBIN A1C: Hgb A1c MFr Bld: 5.9 % (ref 4.6–6.1)

## 2010-10-22 LAB — CARBAMAZEPINE LEVEL, TOTAL: Carbamazepine Lvl: <2 ug/mL — ABNORMAL LOW (ref 4.0–12.0)

## 2010-10-28 LAB — COMPREHENSIVE METABOLIC PANEL
Alkaline Phosphatase: 53 U/L (ref 39–117)
BUN: 8 mg/dL (ref 6–23)
Calcium: 10.1 mg/dL (ref 8.4–10.5)
Creatinine, Ser: 0.84 mg/dL (ref 0.4–1.5)
Glucose, Bld: 92 mg/dL (ref 70–99)
Potassium: 3.9 mEq/L (ref 3.5–5.1)
Total Protein: 8.5 g/dL — ABNORMAL HIGH (ref 6.0–8.3)

## 2010-10-28 LAB — URINALYSIS, ROUTINE W REFLEX MICROSCOPIC
Glucose, UA: NEGATIVE mg/dL
Hgb urine dipstick: NEGATIVE
Ketones, ur: NEGATIVE mg/dL
Protein, ur: NEGATIVE mg/dL

## 2010-10-28 LAB — DIFFERENTIAL
Basophils Absolute: 0 10*3/uL (ref 0.0–0.1)
Basophils Relative: 0 % (ref 0–1)
Lymphocytes Relative: 20 % (ref 12–46)
Monocytes Relative: 4 % (ref 3–12)
Neutro Abs: 5.9 10*3/uL (ref 1.7–7.7)
Neutrophils Relative %: 74 % (ref 43–77)

## 2010-10-28 LAB — CBC
HCT: 44 % (ref 39.0–52.0)
Hemoglobin: 14.3 g/dL (ref 13.0–17.0)
MCHC: 32.6 g/dL (ref 30.0–36.0)
MCV: 87.3 fL (ref 78.0–100.0)
Platelets: 142 10*3/uL — ABNORMAL LOW (ref 150–400)
RDW: 13.2 % (ref 11.5–15.5)

## 2010-10-28 LAB — POCT CARDIAC MARKERS
CKMB, poc: <1 ng/mL — ABNORMAL LOW (ref 1.0–8.0)
Myoglobin, poc: 54.1 ng/mL (ref 12–200)

## 2010-12-02 NOTE — Discharge Summary (Signed)
NAME:  Wyatt Salas, Wyatt Salas             ACCOUNT NO.:  [id number]   MEDICAL RECORD NO.:  [id number]          PATIENT TYPE:  INP   LOCATION:  1403                         FACILITY:  [facility]   PHYSICIAN:  Hillery Aldo, M.D.   DATE OF BIRTH:  08/23/1951   DATE OF ADMISSION:  06/26/2008  DATE OF DISCHARGE:  07/03/2008                               DISCHARGE SUMMARY   [facility]YS[facility]   DISCHARGE DIAGNOSES:  1. Altered mental status secondary to seizure.  2. Breakthrough seizure.  3. Headache.  4. Hypertensive urgency.  5. Diabetes mellitus.  6. Gastroesophageal reflux disease.  7. History of hepatitis C.  8. Fever.  9. Enterococcal urinary tract infection.  10.Dyslipidemia  11.History of subdural hematoma.   MEDICATIONS:  1. Multivitamin daily.  2. Galantamine 16 mg daily.  3. Propranolol 20 mg b.i.d.  4. Colace 100 mg b.i.d.  5. Detrol LA 4 mg daily.  6. Simvastatin 20 mg daily.  7. Keppra 750 mg b.i.d.  8. Hydrochlorothiazide 25 mg daily.  9. Glucophage 500 mg b.i.d.   CONSULTATIONS:  Levert Feinstein, MD of Neurology.   BRIEF ADMISSION HISTORY OF PRESENT ILLNESS:  The patient is a 60-year-  old male with a past medical history of seizure disorder who was brought  to the hospital for evaluation of altered mental status after having  seizure episodes.  For the full details, please see the dictated report  done by Dr. Francene Boyers.   PROCEDURES/DIAGNOSTIC STUDIES:  1. Placement of an upper extremity peripherally inserted central      catheter by ultrasound completed on June 26, 2008.  2. CT scan of the head on June 26, 2008 showed no acute      intracranial abnormality.  Small air-fluid level with hyperdense      material in the right maxillary sinus, possibly representative of      blood products from trauma.  No definite fracture.  May also be      inspissated secretions.  Minimal secretions in left maxillary      sinus.  3. Chest x-ray on June 27, 2008 showed the PICC in place with tip      in the superior vena cava.  No pneumothorax.  No active or acute      cardiopulmonary process.  4. Lumbar puncture under fluoroscopy completed on June 28, 2008.      Opening pressure was 15 cm of water.  5. Chest x-ray on June 28, 2008 showed the right PICC in the SVC.      Lungs were clear.  6. Chest x-ray on June 29, 2008 was stable exam.  No acute chest      process.  7. EEG on June 27, 2008 showed an abnormal awake and sleep EEG.      Focal slowing involving the right hemispheric with sharp transients      at T4-F8 indicating the patient is prone to develop partial      seizure.   LABORATORY DATA:  Discharge laboratory values:  Blood cultures were  negative.  Sodium was 131, potassium 3.8, chloride 97, bicarb  30,  glucose 182, BUN 8, creatinine 0.73, total bilirubin 0.4, alkaline  phosphatase 58, AST 42, ALT 53, total protein 6.3, albumin 2.9, calcium  9.2.  White blood cell count was 7.2, hemoglobin 11.6, hematocrit 35.2,  platelets 240.  CSF cultures were negative.  CSF, VDRL and herpes  simplex viral studies were negative.   HOSPITAL COURSE:  1. Altered mental status secondary to breakthrough seizures:  The      patient has not had any active seizures during the course of his      hospital stay.  There was documented question of medical      noncompliance raised by the patient's home health nurse.  The      patient denied missing doses of his medicine as did his sister.      There was some misinformation communicated on admission in that the      patient indicated that he was taking Tegretol and Keppra for      seizure control.  In speaking with the patient's sister, however,      the patient indicated that he was taken off the Tegretol due to      rising LFTs and side effects.  The initial consultation with the      neurologist did recommend increasing the dose of Tegretol.      Accordingly, during the early part of  the patient's      hospitalization, he was on Tegretol mono therapy.  His Tegretol      level was checked and found to be supratherapeutic on June 29, 2008.  His dosage was subsequently adjusted and when plans were      made to discharge the patient over the weekend, the information      about his being taken off Tegretol and put on Keppra was      ascertained by the patient's sister.  The patient was put back on      Keppra and this was titrated up to a dose of 750 mg b.i.d.  At this      point, he has remained seizure-free throughout his hospital stay      and will be discharged on Keppra at 750 mg b.i.d.  If he      experiences further problems with breakthrough seizures on this      dosage, it can be further increased to 1000 mg b.i.d. as long as      the patient's renal function remained stable.  He should follow up      with the [facility] for ongoing dose titration and  followup.  1. Headaches:  The patient did have a lumbar puncture performed due to      his concurrent fever to rule out meningitis.  The patient's CSF      studies were negative and he had no evidence of meningitis or      meningismus on clinical exam.  His headaches were treated with      analgesics p.r.n.  2. Hypertensive urgency:  The patient did have hypertensive urgency on      initial presentation with blood pressures documented at 171/106 in      the emergency department.  The patient's blood pressure was      monitored closely and he was put on his usual medications in      addition to hydrochlorothiazide for blood pressure control.  His      blood pressure has remained well controlled on  this regimen.  3. Diabetes mellitus:  The patient has been diet controlled in the      outpatient setting.  His hemoglobin A1c is good at 5.7.  He did      have some documented blood glucose levels of the low-mid 200s and      therefore we elected to start him on metformin 500 mg b.i.d.  4. Gastroesophageal  reflux disease:  The patient was maintained on      proton pump inhibitor therapy and has been without symptoms.  5. History of hepatitis C:  The patient has no significant elevation      of his liver function studies.  6. Fever/enterococcal urinary tract infection:  The patient's fever      was felt to be secondary to his recent seizure in the setting of an      enterococcal urinary tract infection.  Because of initial concerns      of meningitis, he was empirically started on vancomycin and      Rocephin.  Once his urine cultures grew out enterococcal bacteria,      he had his antibiotics narrowed  based on the culture data.  His      CSF cultures were negative and vancomycin was subsequently      discontinued.  He has remained afebrile since treating his      enterococcal  urinary tract infection.  7. Dyslipidemia:  The patient was maintained on statin therapy.   DISPOSITION:  The patient is medically stable and will be discharged  home.  He is refusing skilled nursing home placement and his sister  indicates that she can provide 24-hour care.  We will set him up with  home health services at discharge.   Time spent coordinating care for discharge and discharge instructions  equal 35 minutes.      Hillery Aldo, M.D.  Electronically Signed     CR/MEDQ  D:  07/03/2008  T:  07/03/2008  Job:  [id number]

## 2010-12-02 NOTE — H&P (Signed)
NAME:  [name], [name]             ACCOUNT NO.:  [id number]   MEDICAL RECORD NO.:  [id number]          PATIENT TYPE:  EMS   LOCATION:  ED                           FACILITY[facility]  [facility]   PHYSICIAN:  Estelle Grumbles, MDDATE OF BIRTH:  08/23/1951   DATE OF ADMISSION:  06/26/2008  DATE OF DISCHARGE:                              HISTORY & PHYSICAL   REFERRING PHYSICIAN:  [facility].   CHIEF COMPLAINT:  Unresponsiveness with likely a seizure.   HISTORY OF PRESENT ILLNESS:  [name] is a 60 year old African  American male with a prior history of seizure, brought into the  emergency room for evaluation of altered mental status and seizure  episodes.  According to the EMS report, the patient was found at home  lying on a couch __________ next to the patient.  Patient had an episode  of seizure lasting approximately a few minutes and had some blood coming  from the patient's mouth.  The patient was initially completely  confused, combative.  During the transportation, his mental status  improved, and he became more cooperative.   In the emergency room, patient received 2 mg of IM Ativan.  Currently,  the patient is very sleepy, easily awakable, mostly mumbling the words.  He is able to answer simple questions but very difficult to obtain  history secondary to his sleepiness.  Patient states that he woke up  this morning and had headache.  Afterwards, he does not know what  happened.  The next thing he remembers was waking up in the emergency  room.  He denies having fever or having any falls or injuries.  History  is incomplete to all of his medications.  When asking him more questions  to get the complete history, he is getting agitated, became not  cooperative.   REVIEW OF SYSTEMS:  Patient denies having any pain, just wants to sleep.  Complaining of feeling cold and some headache, otherwise unable to  obtain complete review of systems.   PAST MEDICAL HISTORY:  According to the  previous reports, patient has  history of hypertension, diabetes.  Had a subdural hematoma secondary to  the fall.  He had gastroesophageal reflux disease, constipation,  hepatitis C, seizure disorder.   SOCIAL HISTORY:  He smokes 1-1/2 to 2 cigarettes per day for many years.  Unsure about the alcohol and drugs.   FAMILY HISTORY:  Mother deceased with coronary arterial disease.   HOME MEDICATIONS:  According to the previous ED record, patient was on:  1. Tegretol 200 mg 2 tablets 3 times a day.  2. Multivitamin 1 tablet daily.  3. Galantamine 60 mg daily.  4. Propranolol 20 mg twice daily.  5. Colace 100 mg twice daily.  6. __________ 12 mg daily.  7. Simvastatin 20 mg daily.   ALLERGIES:  None.   PHYSICAL EXAMINATION:  Patient is somnolent.  Not in any acute distress.  VITALS:  Temperature 98.7, blood pressure 129/78, pulse 36, respirations  18, O2 saturation 97% on oxygen.  Head is normocephalic and atraumatic.  Pupils are equal and reactive to  light and accommodation.  Oral  cavity:  Very dry.  NECK:  Supple.  No JVD.  CHEST:  Bilateral air entry.  No crackles, rales.  HEART:  S1 and S2.  Regular rate and rhythm.  No murmurs were noted.  ABDOMEN:  Soft.  Bowel sounds positive.  Nontender, nondistended.  No  guarding or rigidity.  EXTREMITIES:  No edema.  CNS:  No focal motor or neuro deficits.   LABS:  Sodium 134, potassium 3.7, chloride 102, bicarb 24, glucose 134,  BUN 3, creatinine 0.59, calcium 9.3, total protein 6.7, albumin 3.1, AST  39.  hemoglobin 12.5, hematocrit 38.1.  Carbamazepine level less than 2.   EKG:  Not available at this time.   IMPRESSION:  1. Breakthrough seizure with a history of prior seizure disorder.  2. History of non-insulin-dependent diabetes mellitus.  3. History of hypertension.  4. Hypercholesterolemia.  5. History of prior intracranial bleed.   PLAN:  It is difficult to say whether the breakthrough seizure is  secondary to medication  noncompliance versus other etiology.  We will  obtain the CT head.  We will obtain the magnesium and phosphorus levels,  urinalysis and chest x-ray to rule out infection.  His home medications  also need to be obtained.  I am not sure whether the patient was on  Keppra.  Patient had multiple recent ED evaluations; however, no  inpatient admissions were noted.  His home medications have to be  verified either with the patient or with his pharmacy.  A complete  history also needed to be obtained once the patient is completely awake.  We will continue the Ativan as needed for seizure episodes.  Will obtain  an EEG today.  Continue the Tegretol.  IV fluid hydration with thiamine  and folate.  Will obtain a urine drug screen and a serum alcohol level.  Patient might need neurological evaluation. Will ask for neuro watch  q.4h.  Follow up the Glucometer.  Will keep the patient n.p.o. at this  time, as the patient seems very somnolent, at risk for aspiration.  Will  resume the diet once the patient is completely awake.      Estelle Grumbles, MD  Electronically Signed     TP/MEDQ  D:  06/26/2008  T:  06/26/2008  Job:  [id number]

## 2010-12-02 NOTE — Discharge Summary (Signed)
NAME:  Wyatt Salas, Wyatt Salas             ACCOUNT NO.:  [id number]   MEDICAL RECORD NO.:  [id number]          PATIENT TYPE:  INP   LOCATION:  3729                         FACILITY:  [facility]   PHYSICIAN:  Pearlean Brownie, M.D.DATE OF BIRTH:  08/23/1951   DATE OF ADMISSION:  03/04/2007  DATE OF DISCHARGE:  03/07/2007                               DISCHARGE SUMMARY   [facility]   CONSULTATIONS:  None.   PROCEDURE:  None.   REASON FOR ADMISSION:  The patient is a 60 year old male with a history  of CVA with, according to past records, either a subdural hematoma or a  hemorrhagic CVA, not clear from the records. After this CVA had begun to  have seizures. The reason he was admitted on the 15th was a witness  tonic-clonic seizure. The patient was in a postictal state and it was  not clear if his condition after his postictal state was his baseline  mental status or if he was having altered mental status.   DISCHARGE DIAGNOSES:  Primary tonic-clonic seizure.   SECONDARY DIAGNOSES:  1. History of cerebrovascular accident.  2. Seizure disorder.  3. Hypertension.  4. Chronic headaches.  5. Diabetes.  6. Constipation.  7. History of hepatitis C.  8. Multiple fractures in the past.  9. History of alcohol and cocaine use.   DISCHARGE MEDICATIONS:  The patient was discharged on the following  medications:  1. Tegretol 200 mg 2-1/2 tablets t.i.d.  2. Docusate sodium 100 mg p.o. b.i.d.  3. Prozac 20 mg p.o. q.a.m.  4. Galantamine 16 mg p.o. q.a.m.  5. Glyburide 5 mg p.o. daily.  6. Lisinopril 20 mg 1/2-tablet p.o. daily.  7. Multivitamin p.o. daily.  8. Avandia 4 mg p.o. daily.  9. Detrol LA 4 mg p.o. daily.   LABORATORY DATA:  Laboratories for this hospitalization are included in  the hospital course by problem.   HOSPITAL COURSE:  #1.  Questionable altered mental status. The patient  came to the ER and was confused, not able to state the year  although he  was oriented to person and place. It was thought that he was most likely  experiencing a postictal state. The patient states he has memory  deficits at baseline. Throughout the course of his hospitalization, the  patient's mentation improved. He did become oriented to date. He now  feels that he is at his baseline status. A CT of the head was done and  was negative for any acute findings. The patient was knowledgeable about  his medications. He recognized some of the names of medicines but he did  not know all of his medicines or his dosages. A pharmacy student called  the [facility] hospital and got records of his medications and dosages that he  receives from them. The patient does have a home health nurse who comes  out 3 times a week who helps him with his medicines. We attempted to  contact her multiple times but these attempts were futile and we were  never able to contact her. We will encourage the patient [facility] or two.  #2.  Seizures. We placed the patient on Tegretol 400 t.i.d. during his  hospital stay. We discovered this morning that his dose is actually 500  t.i.d. His Tegretol level was therapeutic at 11.2 on admission. It is  not clear the reason for this breakthrough seizure. It appears that the  patient has been taking his Tegretol. The patient was hyponatremic when  he came to the hospital, perhaps this contributed to his seizure.  However past records indicate that this may be a baseline for him if he  has been hyponatremic in the past. Again, we asked the patient to have  [facility] to [facility] his seizure control.  #3.  Hyponatremia. On admission, the patient was hyponatremic with a  sodium of 126. 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 He received 1 liter of  normal saline in the ER and we continued to gently hydrate him. His  sodium  improved into the 130s, on the day of discharge it is 129. This  is another issue that should be followed up with his primary care  physician at the [place].  #4.  Diabetes. The patient we believe is on Avandia, Glyburide and  lisinopril at home. In the hospital, we left him on his lisinopril 10 mg  and put him on sensitive sliding scale insulin. His blood sugars were  well controlled with this regimen. We will discharge him home on what we  believe to be his home medicines of Avandia and Glyburide.  #5.  PT/OT. The patient does seem to be deconditioned. We had the  patient evaluated by physical therapy and occupational therapy, they  recommended home health PT and OT which has been ordered.  #6.  Headache. The patient has a history of chronic headaches. He was  given Tylenol and Motrin for these while in house.   CONDITION ON DISCHARGE:  The patient's condition at the time of  discharge stable pending test results at the time of discharge. In order  to evaluate his hyponatremia, we ordered a urine sodium, urine  osmolality, serum osmolality, serum sodium and a UA specific gravity.  These results are not available at the time of this dictation. However,  we will attempt to fax these results to the [place] when they come in.   DISPOSITION:  The patient is discharged home with his home health  regimen with our order for home health evaluation by PT/OT.   FOLLOWUP:  The patient is instructed to [facility].   FOLLOWUP ISSUES:  As mentioned previously, workup for hyponatremia  including urine sodium, urine osmolality, serum osmolality, serum sodium  and urine specific gravity. We will attempt to fax these results to the  [place] when we receive them. Otherwise his PCP should followup on the  hyponatremia workup.      Asher Muir, MD  Electronically Signed      Pearlean Brownie, M.D.  Electronically Signed    SO/MEDQ  D:  03/07/2007  T:   03/07/2007  Job:  [id number]   cc:   [facility] [place]

## 2010-12-02 NOTE — Procedures (Signed)
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  Tegretol level less than 2.   CURRENT MEDICATIONS:  Tegretol 400 mg t.i.d., Rocephin, Remeron,  NovoLog, nicotine patch, Protonix, Inderal, Zocor, Atrovent, vancomycin,  Tylenol, Ativan, Reglan, Zofran, OxyContin, and Ambien.   TECHNICAL COMMENT:  An 18-channel EEG was performed based on 10-20  international system, total recording time 23.4 minutes, the 17-channel  dedicated showed normal sinus rhythm of 72 beats per minute.   Upon awakening, the posterior background activity was asymmetric, right  side posterior background activity was mixed alpha and theta range  activity, left side is slower in the theta range 5-6 Hz.  Left side  anterior background activity in the 7-8 Hz, right side has a  dysarrhythmic mixed theta and delta activity.  There was no epileptiform  discharge recorded, however during the drowsiness, the patient was  noticed to have T4 and sharp transient spreading to F8.   Photic stimulation and hyperventilation was not performed.   The patient was able to achieve stage II sleep during the recording as  evident by drop of eye blinking motion artifact, sleep spindles.   CONCLUSION:  This is an abnormal awake and sleep EEG.  There is focal  slowing involving the right hemisphere with sharp transient at T4, F8  indicating that the patient prone to develop partial seizure.      Levert Feinstein, MD  Electronically Signed     YB:OFBP  D:  06/27/2008 21:52:50  T:  06/28/2008 01:35:00  Job #:  [id number]

## 2010-12-02 NOTE — H&P (Signed)
NAME:  Wyatt Salas, Wyatt Salas             ACCOUNT NO.:  [id number]   MEDICAL RECORD NO.:  [id number]          PATIENT TYPE:  INP   LOCATION:  3729                         FACILITY:  [facility]   PHYSICIAN:  Leighton Roach McDiarmid, M.D.DATE OF BIRTH:  08/23/1951   DATE OF ADMISSION:  03/04/2007  DATE OF DISCHARGE:                              HISTORY & PHYSICAL   [facility] [place]   CHIEF COMPLAINT:  Seizure and questionable altered mental status.   HISTORY OF PRESENTING ILLNESS:  The patient is a 60 year old male with a  past medical history of CVA with questionable hemorrhage or possible  subdural hematoma not clear from the records and seizures who presented  to [facility] for evaluation after a witnessed seizure.  The  patient reports that he was at home this morning.  He recalls some chest  pain that he thought was indigestion and when he took Rolaids, that  relieved the pain.  He states he was preparing to go to [facility]  where he receives vocational rehab.  He does not seem to remember much  after that other than that he fell.  Apparently the SCAT driver  witnessed the patient having a seizure and called 911.  The patient  recalls waking up in the ambulance and coming to the emergency room.  He  also states that during this episode, he urinated on himself.   REVIEW OF SYSTEMS:  Positive for heartburn, some baseline memory  problems, negative for fever, chills, alcohol use, nausea, vomiting,  diarrhea, sores, rashes or other skin problems.   PAST MEDICAL HISTORY:  Patient with a history of a CVA.  I am not clear  if it was a hemorrhagic CVA or a CVA plus a subdural hematoma.  Seizure  disorder.  Patient reports low blood pressures, chronic headaches,  diabetes, constipation, hepatitis C, multiple fractures in the past and  history of alcohol and cocaine use.   MEDICATIONS:  The list was obtained from the ER but the patient is not  able to name or  give doses for his medicines.  1. Tegretol.  2. Colace.  3. Prozac.  4. Reminyl.  5. Glyburide.  6. Lisinopril.  7. Avandia.  8. Detrol.  9. Multivitamins.   PAST SURGICAL HISTORY:  1. Open reduction and internal fixation of left ankle.  2. Craniotomy for past cerebrovascular accident that was either      hemorrhagic or accompanied by a subdural hematoma.   FAMILY MEDICAL HISTORY:  Mother bled to death.  Based on the patient's  description, it sounds like she had a GI bleed.  Father no significant  medical history.  The patient believes that his siblings are healthy and  that his kids are healthy, although he does not keep in touch with them.   SOCIAL HISTORY:  He lives alone in an apartment.  He has a home health  nurse 3 times a week.  Her name is Jasmine December, phone number [phone].  Home  health nurse helps him with his meds.  The patient reports that he has a  few friends  from the [facility] that check on him periodically.  He  reports that he quit alcohol approximately 10 years ago, quit tobacco  products only 10 years ago, has remote history of using elicit drugs.  The patient uses a wheelchair and a cane to ambulate.  He is single.   PHYSICAL EXAMINATION:  VITAL SIGNS:  Temperature 98.6, pulse 90,  respirations 16, blood pressure 160/92, oxygen saturation 97% on room  air.  GENERAL:  The patient is in no acute distress.  He is somewhat sleepy.  HEENT:  The patient is normocephalic, atraumatic.  Pupils are equally  round and reactive to light.  Extraocular movements are intact.  Oropharynx is clear.  Moist mucous membranes.  A mild submandibular  lymphadenopathy.  CARDIOVASCULAR:  Regular rate and rhythm, no murmurs, rubs or gallops.  PULMONARY:  The patient is clear to auscultation bilaterally.  No  increased work of breathing, no wheezes, rhonchi or rales.  ABDOMEN:  Soft, nontender, nondistended.  No organomegaly.  Positive  bowel sounds.  EXTREMITIES:  Good pulses  in the extremities.  Toenails are extremely  hypertrophic consistent with a fungal infection.  NEUROLOGIC:  The patient is cooperative.  He is oriented to person and  place but not to time.  He thinks either that it is either the 1950s or  1985.  Can recall only 1 out 3 objects.  When asked to spell world  backwards spelled blow.  Cranial nerves II through XII grossly intact.  Strength is 4 out of 5 on the right side, 3 out of 5 on the left.  The  patient states that this is his baseline strength ever since he had the  stroke.  No dysdiadochokinesis.  No asterixis.   LABORATORY:  Electrolytes:  Sodium 126, potassium 4.0, chloride 92,  bicarb 22, BUN 2, creatinine 0.66, glucose 139.  CBC:  White blood cells  8.2, hemoglobin 13.7, hematocrit 41.7, platelets 208,000.  Urine drug  screen was negative.  Alcohol level was less than 5.  Tegretol level was  11.2 which is within the normal range of 4.0 to 12.0.  Total bilirubin  was 0.8, alkaline phosphatase was 66, ALT was 56, AST was 59, total  protein 6.8, albumin 3.6, calcium 8.9.  one set of cardiac enzymes was  checked which showed a CK of 153, CK-MB of 1.4 and troponin of 0.03.   CT of the head showed a right MCA distribution encephalomalacia  consistent with previously described right MCA infarct.  No acute  intracranial findings.   ASSESSMENT:  The patient is a 60 year old male with history of  cerebrovascular accident and seizures admitted after having a witnessed  seizure, now with questionable altered mental status.   PLAN:  1. Possible altered mental status:  The patient is not able to      remember seizures or events immediately proceeding it.      Additionally he is not able to state the year.  Most likely, he is      experiencing his postictal state after having a witnessed tonic-      clonic seizure.  He also probably has some memory deficits at      baseline.  He states that he does have memory problems and this is       mentioned in his past medical record as well.  The patient reports      that he feels like he is close to his baseline status when he is  interviewed.  A new CVA is unlikely as the CT is negative and find      the left sided weakness on exam is apparently not new.  We will      continue to monitor the patient's mental status.  We attempted to      contact his home health nurse to obtain information about the      patient's baseline and his medications but were unable to get an      answer on her telephone.  We will try again tomorrow morning.  2. Seizures:  The patient is on Tegretol.  His level is therapeutic.      He does not know what his home dose is.  We started him on 400 mg      t.i.d. for now.  We will attempt to contact the home health nurse      for his dose.  3. Hyponatremia:  The patient's sodium is at 126.  Past records      indicate low sodium previously.  It is possible that he may have      some cirrhosis from his hepatitis C and past alcohol use and this      may be evident of that liver damage.  He received 1 liter of normal      saline in the ER.  We will give normal saline at 50 mL/hour tonight      and will recheck a basic metabolic rate in the morning.  It is      unlikely that the hyponatremia warrants a full workup at this      hospitalization.  4. Diabetes:  The patient is on Avandia and Glyburide and lisinopril      at home.  In the hospital, we will do sensitive sliding scale      insulin and continue lisinopril at 10 mg.  5. Chest pain:  The patient mentioned that he had 1 episode of brief      chest pain in the morning before he came to the hospital.  He      describes it as indigestion and states that it was completely      relieved by Rolaids.  But because of this, we did check 1 set of      cardiac enzymes.  CK, CK-MB and troponin were all negative.  No      further action is needed at this time.  6. Social:  It is not clear if the home situation is  completely safe      for this patient.  We ordered a social work consult to assess this.  7. Other:  We also ordered a PT and OT consult as the patient seemed      deconditioned.  8. Disposition:  The patient may be able to discharge home in the      morning if his mental status improves to what we believe to be his      baseline.      Asher Muir, MD  Electronically Signed      Leighton Roach McDiarmid, M.D.  Electronically Signed    SO/MEDQ  D:  03/05/2007  T:  03/05/2007  Job:  [id number]

## 2010-12-05 NOTE — Discharge Summary (Signed)
[place]  Patient:    Wyatt Salas                     MRN: [id number] Adm. Date:  [id number] Disc. Date: 06/16/99 Attending:  Tobin Chad Dictator:   Creed Copper. Cornelius Moras, M.D. CC:         Dr. Leveda Anna             Dr. Luciana Axe, [facility]                           Discharge Summary  DISCHARGE DIAGNOSES:  1. Seizure secondary to not taking antiseizure medication.  2. Chronic prolonged history of seizure disorder.  3. History of subdural hematoma, status post craniotomy May 2000.  4. History of [facility] spotted fever.  5. Alcohol and drug abuse.  6. Questionable psychiatric problems in the past.  7. History of tobacco abuse.  8. Gastritis diagnosed by esophagogastroduodenoscopy.  9. History of anemia with heme-positive stool. 10. History of hyperglycemia. 11. History of migraines. 12. History of gonorrhea, post treatment. 13. Disabled secondary to cognitive disabilities.  DISCHARGE MEDICATIONS:  1. Ultram 100 mg every six hours as needed for pain.  2. Tegretol 200 mg every morning, 300 mg every evening.  DISCHARGE INSTRUCTIONS:  No specific discharge instructions, except to return to the emergency room or call regular M.D. for any increase in seizure activity.  HISTORY OF PRESENT ILLNESS:  This is a 60 year old black male with a history of  seizure disorder, on Tegretol.  The patient reports that he skipped a few days f doses of Tegretol because he wanted to drink alcohol over the holiday weekend. He was at home in the kitchen when he began seizing.  He was brought to the emergency department and given Valium and loaded with Dilantin.  He was postictal for a prolonged period of time, and he briefly became hypotensive.  Head CT with no apparent disease.  Patient observed in the emergency department with nystagmus nd eye deviation to the right during seizure.  LABORATORY DATA:  At the time of admission, laboratories were  sodium 140, potassium 3.4, chloride 104, bicarbonate 27, BUN 8, creatinine 0.8, glucose 139. Hemoglobin 16.  Tegretol less than 5, ETOH less than 10.  Urine drug screen was negative.   CT scan showed old right chronic changes.  No acute disease.  HOSPITAL COURSE:  The patient was admitted for observation, with seizure precautions.  Did well overnight.  Was begun on his regular outpatient regimen f 200 mg in the morning, 300 mg at night of Tegretol.  After some discussion of whether or not we should start it lower dose, it was decided that since the patient had not been off his Tegretol very long, probably restarting at the regular dose would be fine as his liver enzymes would have increased activity and would likely metabolize the Tegretol at the same rate that it had been before.  The patient id not have any further seizure activity during hospitalization.  At the time of discharge, he is complaining of headache, which is a chronic problem for him. e does have his headache medications at home.  He is ready to leave.  The patient  states that he has refills available for all of his medications.  FOLLOW-UP:  Was attempted to be scheduled with [facility].  Unfortunately, they were not answering their telephone.  The patient is instructed to call  [facility] for an appointment later this week to check his Tegretol level. DD:  06/16/99 TD:  06/16/99 Job: [id number]

## 2010-12-05 NOTE — Discharge Summary (Signed)
NAME:  Wyatt Salas, Wyatt Salas NO.:  [id number]   MEDICAL RECORD NO.:  [id number]          PATIENT TYPE:  INP   LOCATION:  5501                         FACILITY:  [facility]   PHYSICIAN:  Manning Charity, MD     DATE OF BIRTH:  08/23/1951   DATE OF ADMISSION:  06/09/2008  DATE OF DISCHARGE:  06/11/2008                               DISCHARGE SUMMARY   ATTENDING PHYSICIAN:  Manning Charity, MD   DISCHARGE DIAGNOSES:  1. Recurrent seizure secondary to medication noncompliance.  2. Hyponatremia secondary to ADR, resolved.  3. Diabetes mellitus, type 2 with hemoglobin A1c of 5.4.  4. Hepatitis C.  5. Hypertension.  6. Hyperlipidemia.  7. Gastroesophageal reflux disease.  8. History of subdural hematoma in May 2009 secondary to traumatic      injury resulting in encephalomalacia.  9. History of cocaine abuse.  10.History of [facility] spotted fever.  11.History of tobacco abuse.  12.Gastritis diagnosed via esophagogastroduodenoscopy.  13.History of gonorrhea, status post treatment.   DISCHARGE MEDICATIONS:  1. Prozac 20 mg p.o. daily.  2. Lisinopril 10 mg p.o. daily.  3. Detrol 4 mg p.o. daily.  4. Galantamine 15 mg p.o. daily.  5. Keppra 500 mg p.o. b.i.d.  6. Simvastatin 40 mg p.o. daily.   The patient was instructed to discontinue his glyburide as well as his  Avandia upon discharge and told to follow up with his primary care  physician prior to restarting these medications.   DISPOSITION/FOLLOWUP:  The patient was discharged home in stable  condition with no more episodes of seizures.  He is to follow up with  his primary care physician, Dr. Jules Husbands on June 18, 2008, at 10 a.m.  At this time, the patient should be assessed for med compliance  especially his antiseizure medications.  He should have his blood sugars  followed up on and based on these, his Avandia and glyburide should be  restarted for his diabetes.  A BMET should also be check to assure  continued resolution of his hyponatremia.   PROCEDURE PERFORMED:  1. A CT of the head without contrast performed on June 09, 2008,      revealed a stable right-sided encephalomalacia and cerebellar      volume loss.  No evidence of acute intracranial abnormality was      noted.  2. A plain film of the left wrist performed on June 10, 2008,      revealed osteopenia without acute findings of fracture or      dislocation.   CONSULTATIONS:  None.   BRIEF ADMITTING HISTORY AND PHYSICAL:  Mr. Dudenhoeffer is a 60 year old male  with past medical history significant for seizure disorder, diabetes  mellitus, hypertension, and other chronic medical issues who was  recently discharged 3 days prior to current admission for seizure and  hyponatremia.  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  Therefore, he was switched over to  Keppra upon his previous discharge.  The patient was not taking his  Keppra at home because he had trouble with transportation and  financial  resources, although he states that he does have Medicaid.  His seizures  at that time were also thought to be secondary to his low-sodium level.  On this admission per report, the patient was witnessed to have 3  seizures by his caretaker during which he had bladder incontinence and  his eyes rolled to the back of his head.  Per patient, he reports no  fall or trauma to the head and he denies any headache, vision changes,  chest pain, shortness of breath, abdominal pain, fevers, chills, tongue  biting, dysuria, or cough.  The patient does, however, admit to some  left wrist pain after his seizure disorder.   PHYSICAL EXAMINATION:  VITAL SIGNS:  The patient had a temperature of  99.0 degrees, blood pressure 114/65, pulse of 84, respiratory rate of  14, oxygen saturation of 98% on room air.  GENERAL:  He was in no acute distress, drowsy, rough in the voice,  answers questions  appropriately, not very cooperative to physical exam.  HEENT:  Eye exam revealed pupils equal, round, react to light.  Extraocular motions intact, arcus.  ENT exam revealed moist mucous  membranes with oropharynx and oral cavity is clear.  NECK:  Supple with no lymphadenopathy or carotid bruits.  RESPIRATORY:  Decreased breath sounds bilaterally, but was clear to  auscultation in both lung fields.  CARDIOVASCULAR:  Regular rate and rhythm with no murmurs, rubs, or  gallops.  GI:  Positive bowel sounds and a soft and nondistended abdomen.  There  was some mild diffuse tenderness to palpation in the abdomen without any  rebound or guarding.  EXTREMITIES:  No clubbing, cyanosis, or edema.  The left wrist was  tender to palpation, but had full range of motion and good radial pulse.  SKIN:  Dry scaly skin with residual hyperpigmented spots over the palms.  MUSCULOSKELETAL:  The patient to be moving all four extremities, but  4+/5 strength throughout.  NEUROLOGIC:  The patient to be drowsy and oriented to person, state, and  hospital, but not year or age.  Cranial nerves II through XII were  intact except for he has slight left with lip droop.  Sensation was  intact throughout and finger-to-nose was intact bilaterally but labored.   LABORATORY DATA:  On admission, the patient had a white blood cell count  of 10.3, hemoglobin of 40.0, platelets 233, ANC of 6.7, MCV of 89.1.  Sodium 127, potassium 3.7, chloride 96, bicarb 22, BUN 11, creatinine  0.83, glucose 98, total bilirubin 0.7, alk phos 79, AST 67, ALT 90,  total protein 7.7, albumin 4.1, calcium 9.5.  The patient's CBG by EMS  was 86.  Serum Dilantin level was less than 2.5. serum Tegretol level  was less than 2.0, INR was 1.0.  PT was 13.6, PTT was 28.  Serum alcohol  was less than 5.  Creatinine kinase level was 107.  Serum osmolality was  268.   HOSPITAL COURSE BY PROBLEM:  1. Recurrent seizures in the setting of missed  medications.  On      admission, the patient was found to have been noncompliant with his      recent switch from carbamazepine to Keppra.  The patient had no      signs of infection, hypoglycemia, hypoxemia, or alcohol withdrawal      on admission.  The foci of seizures was most likely secondary to      his encephalomalacia and other causes including uremia, liver  failure, central nervous system tumor, encephalopathy, new      cerebrovascular accidents were all considered, but less likely      etiologies.  A CT of the head was performed, which showed no new      abnormalities.  The patient's cardiac enzymes were cycled and the      patient was found to not have any significant changes on EKG on      admission.  He was admitted to a regular bed and restarted on his      Keppra.  The patient did not have any further issues with seizure      activity throughout the rest of his hospital course and the day of      discharge, he was confirmed by his caretaker that the patient did      not receive his new Keppra medication as this was only recently      faxed over to the [facility] where he gets his primary medical      care from and it would take at least 10 days for the medication to      arrive at the patient's home.  Therefore, a Child psychotherapist consult      was obtained to assess the patient's options for requiring a short-      term course of the Keppra until he can receive them from the [facility] and home health nurse was arranged to help the patient      manage his medications at home prior to discharge as well.  2. Hyponatremia.  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  At that time, carbamazepine was discontinued and his      discharge sodium was 131 and during this admission, his sodium was      only found to be  slightly decreased at 127.  His low sodium was      thought unlikely to be the cause of his recurrent seizure, however.      The patient was fluid restricted for the remainder of his      hospitalization and by day of discharge, his sodium level had again      come back up to 131.  The patient should have a followup basic      metabolic profile performed by his primary care physician to assure      continued resolution of his hyponatremia.  3. Left wrist pain.  During admission, the patient was complaining of      some focal left wrist pain on exam and because of his reported      seizure activity, there was concern that there could be a fracture      or dislocation in the area.  However, a plain film was obtained and      no acute abnormalities were noted.  The patient had his pain      controlled with oral pain medications during his admission and by      the day of discharge, he had no further complaints of wrist pain.  4. Diabetes mellitus, type 2.  The patient's hemoglobin A1c was found      to be 5.4 on most recent admission.  Therefore, a repeat A1c  level      was not checked.  The patient was started on sliding scale insulin      and continued with his home Avandia and glyburide during this      admission.  The patient had CBGs ranging between 72 and 100 and 100      to 125.  However, on hospital day #2, the patient experienced a      hypoglycemic event with CBG of 42, which resolved with oral      glucose.  Therefore, upon discharge, the patient was instructed to      hold both of his glyburide and Avandia until he could follow up      with his primary care Diantha Paxson.  The patient was instructed to      check his blood sugars regularly and provide a copy of this      information to his PCP upon his followup appointment.  Furthermore,      he was instructed that if his blood sugars were consistently more      than 200, he should automatically start his Avandia 4 mg p.o. daily       prior to his followup appointment.   DISCHARGE LABORATORIES AND VITALS:  On the day of discharge, the patient  had temperature of 97.9, blood pressure of 115/70, pulse of 65,  respiratory rate of 12, sating 98% on room air.  His white blood cell  count was 7.2, hemoglobin 14.9, platelets 202.  Sodium 131, potassium  4.4, chloride 94, bicarb 26, glucose 72, BUN 5, creatinine 0.6, total  bilirubin 0.9, alk phos 69, AST 81, ALT 99, total protein 8.4, albumin  4.3, calcium 10.2.  Acute hepatitis panel showed hepatitis B surface  antigen to be negative, hepatitis B core antibody IgM to be  indeterminate, hepatitis A antibody IgM to be negative, and hepatitis C  antibody to be reactive.      Lucy Antigua, MD  Electronically Signed      Manning Charity, MD  Electronically Signed   RK/MEDQ  D:  08/29/2008  T:  08/30/2008  Job:  [id number]   cc:   Floyde Parkins
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
## 2010-12-05 NOTE — Discharge Summary (Signed)
[facility]  Patient:    Wyatt, Salas Visit Number: [id number] MRN: [id number]          Service Type: MED Location: [id number] Attending Physician:  Lynann Bologna Dictated by:   Jarome Matin, M.D. Admit Date:  08/03/2001 Discharge Date: 08/11/2001                             Discharge Summary  ADMITTING DIAGNOSIS:  Multiple seizure activity in a patient who is on medication.  HISTORY OF PRESENT ILLNESS:  [name] is a 60 year old patient who resides at [name]. [facility].  He has a history of seizure activity, and he had a seizure on the morning of admission.  He had up until that time been stable on Tegretol 250 mg three times a day.  The patient is a diabetic and takes Amaryl 4 mg a day, and the patient also has a diagnosis of hepatitis C.  He was in the house last year with seizure activity.  He also had subarachnoid bleed, and that is when he was started on Tegretol.  The patient had been on Lantus and Actos and Humulin R.  However, he has been fairly well-controlled on Amaryl.  He is also on Altace.  The patient was admitted.  PHYSICAL EXAMINATION:  VITAL SIGNS:  Physical exam revealed a blood pressure of 150/105, a pulse of 92, respirations 20.  GENERAL:  He was somewhat postictal when I first saw him in the emergency room, and he also had a seizure while he was being examined and was given IV Ativan.  The Tegretol was continued.  He gradually cleared and was conversant.  CHEST:  Clear to auscultation and percussion.  HEART:  He had a regular sinus rhythm.  ABDOMEN:  Soft, no masses, no organomegaly.  EXTREMITIES:  He could move all his extremities.  NEUROLOGIC:  Reflexes were bilateral and equal.  HOSPITAL COURSE:  Dr. Sandria Manly saw him in consultation for neurology, and Keppra 500 mg twice a day was added.  His Tegretol level was found to be very high, 15.6, and the Tegretol was held for a couple of  days.  The patient was found to have an old left clavicular fracture.  He also had an old fracture of his ribs, sometimes after a seizure he had fallen, and he had some evidence of left foot drop.  Because of the old fracture that was found, I asked Dr. Darrelyn Hillock to see him.  He saw him and stated that he would like to follow him up as an outpatient, and we got the mobility team to help with ambulation and will continue some PT after discharge at [facility].  We will also continue on a diabetic diet and will put him on the Tegretol 250 mg twice a day instead of three times a day.  Continue also on Keppra 500 mg twice a day. He was taking some Tylenol With Codeine for pain and Vioxx and Mobic 7.5 mg. He can use Mobic as an outpatient 7.5 mg daily.  Sorbitol is being used to help to keep his bowels regular.  DISCHARGE DISPOSITION:  We can go ahead and discharge him back to [facility] today, and he can see Dr. Darrelyn Hillock in about three weeks after discharge and I can see him in two weeks after discharge in the office.  ADDENDUM:  As far as his diabetes  is concerned, right now we are going to continue him on Amaryl 4 mg daily, and then we will take CBGs and see how his blood sugar is running, and we can decide to add another medication. Dictated by:   Jarome Matin, M.D. Attending Physician:  Lynann Bologna DD:  08/10/01 TD:  08/10/01 Job: [phone] [id number]

## 2010-12-05 NOTE — Consult Note (Signed)
[place]  Patient:    Wyatt Salas, Wyatt Salas Visit Number: [id number] MRN: [id number]          Service Type: MED Location: [phone] Attending Physician:  Wyatt Salas Dictated by:   Wyatt Salas, M.D. Admit Date:  08/03/2001   CC:         Wyatt Salas, M.D.   Consultation Report  PATIENT ADDRESS:  [address] [place], [place], [address].  DATE OF BIRTH:  August 23, 1949  CHIEF COMPLAINT:  This is one of multiple [facility] admissions for this 60 year old right-handed black divorced male who currently lives in assisted living at [place] [place].  He was admitted on August 03, 2001 from the emergency room following two seizures.  HISTORY OF PRESENT ILLNESS:  Wyatt Salas has a known history of seizures but exactly when they began is unclear.  He states "after he got out of the [employer]." He states that while living in other states - specifically [place] and [place] - he did have a history of seizures.  He had been on Dilantin, phenobarbital, and Tegretol in the past . In 2000 he had closed head injury, falling down stairs, with an associated seizure.  He had a right-sided subdural requiring craniotomy and prolonged hospitalization including PEG tube placement and tracheostomy.  He subsequently was rehabilitated at [facility] [place].  At that time he had a left hemiparesis and was able subsequently to walk with a walker and then a cane.  At his assisted living facility he has had admissions to [facility] in May 19, 2001 for re-evaluation of seizures.  At that time he had four generalized major motor seizures and a CT scan showed evidence of right temporal parietal encephalomalacia.  His carbamazepine level was low and it was presumed that his seizures were secondary to noncompliance with medication.  In the  past, in May 2002, he had been on Keppra as well as Tegretol.  He states that in the nursing home facility he requires some assistance in bathing, dressing, and taking care of his toilet needs.  He also requires assistance in cutting the meat in his tray.  He has not had any new neurologic symptoms but has a continued problem with migraine headaches.  He was in his usual state of health until the morning of admission when, without warning, he had a generalized major motor seizure lasting one to two minutes with capillary blood glucose of 125 on scene by the EMT service.  He had a second generalized major motor seizure at [facility] following his CT scan evaluation. He was admitted by Dr. Bascom Levels for further evaluation.  Laboratory studies revealed a sodium 135, potassium 4.4, chloride 99, CO2 content not obtained, BUN 9, creatinine 0.8, glucose 132.  Hemoglobin 17 and hematocrit 49.  His carbamazepine level was 7.0.  PHYSICAL EXAMINATION TODAY:  GENERAL:  Reveals a well-developed black male who is a very poor historian and gives vague, nondescript answers.  He indicates that he has never had a craniotomy in the past but it is noted that he has previous craniotomy to the right side.  VITAL SIGNS:  Blood pressure sitting in the right and left arm 120/80 without change going from the sitting to the standing position.  Heart rate was 64 and regular.  There were no bruits.  He was status post right frontotemporal surgery and  had a previous scar from presumed decubitus in his right parietal region.  NEUROLOGIC:  Mental status reveals he was alert.  He was oriented to person, place, year, and month.  He did not know the Economist, Warehouse manager, or [employer].  He did know the number of nickels in $1, $1.25, and #1.35.  He could remember 2/3 objects at five minutes.  He could follow commands.  He had no denial of the left side of his body or illness.  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  He had a left seventh dysarthria, tongue midline, uvula midline, gags present.  His extraocular movements were full and corneals were present. His disks were both seen and flat.  He had decreased left shoulder shrug.  He had evidence of a left hemiparesis with motor function at 5/5 on the right but 3-4/5 on the left.  He could open and close his left hand.  He could touch his thumb to his index finger.  He could stand on his toes, he could stand on his heels.  His sensory examination revealed altered sensation on the left as compared to the right with stocking distribution decreased pinprick, poor two-point discrimination in his hands.  He had increased deep tendon reflexes on the left as compared to the right, and upgoing plantar response on the left and downgoing on the right.  He had an unsteady gait with left hemiparesis and flexed tone of his left arm at the shoulder and at the elbow.  Further information reveals that the patient has complained of left shoulder pain.  IMPRESSION:  1. 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  2. History of seizures, code 345.10.  3. 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  4. History of cocaine use, code 305.62.  5. History of alcohol use, code 303.9.  6. Diabetes mellitus, code 250.60, with diabetic neuropathy, code 357.2.  7. Hypertension, code 796.2.  8. Hyperlipidemia, code 272.4.  9. Left hemiparesis, code 342.10. 10. Migraine headaches, code 346.10. 11. Hepatitis C, code 577.3.  PLAN:  The plan at this time is to obtain CBC, liver function tests, drug screen, MRI study, and EEG, and consider the possibility of addition of a second drug in view of his therapeutic carbamazepine level. Dictated by:   Wyatt Salas, M.D. Attending Physician:  Wyatt Salas DD:  08/04/01 TD:   08/06/01 Job: [id number]

## 2010-12-05 NOTE — Discharge Summary (Signed)
[place]. [facility]  Patient:    Wyatt Salas, Wyatt Salas Visit Number: [id number] MRN: [id number]          Service Type: MED Location: [phone] Attending Physician:  Mick Sell Dictated by:   Kelli Hope, M.D. Admit Date:  05/18/2001 Discharge Date: 05/21/2001                             Discharge Summary  DATE OF BIRTH:  [birth year]  ADMISSION DIAGNOSES: 1. Status epilepticus with history of seizures. 2. Suspected aspiration. 3. History of a closed head injury. 4. History of substance abuse. 5. Diabetes mellitus.  DISCHARGE DIAGNOSES: 1. Status epilepticus due to medication noncompliance, resolved. 2. History of previous head injury with resultant left hemiparesis and mild    gait disorder. 3. History of substance abuse. 4. Diabetes mellitus.  CONDITION ON DISCHARGE:  Good.  DIET:  Regular.  ACTIVITY:  Ambulate with walker.  DISCHARGE MEDICATIONS: 1. Carbamazepine 400 mg p.o. t.i.d. 2. Amaryl 4 mg p.o. q.d. 3. Altace 1.25 mg p.o. q.d. 4. Multivitamin p.o. q.d. 5. Colace 100 mg b.i.d. 6. Chlor-Con 20 mEq p.o. t.i.d. 7. Sorbitol 70% 15 cc q.d. 8. Insulin sliding scale.  STUDIES: 1. Chest x-ray was performed on 10/30 and 05/19/01, revealing good ET tube placement and mild bibasilar atelectasis. 2. Laboratory review:  CBC on admission showed white blood cell count 13.4, hemoglobin 12.7, hematocrit 39.4, with normal shift.  ______ level unobtainable.  ISTAT 8 remarkable for a decreased bicarbonate of 15, elevated glucose of 189.  Vitamin B12 level and folate on 05/20/01, normal.  HOSPITAL COURSE:  Please see admission H&P for full admission details.  Briefly, this is a 60 year old man who resides at [facility] who while at a church function suffered serial seizures with altered mental status upon presentation to the emergency room.  His medical history is remarkable for epilepsy and previous  subdural hematoma, and temporal lobe contusion.  He was brought to the emergency room severely encephalopathic, and was intubated for airway protection.  His seizures were controlled acutely with Dilantin, Ativan, and Versed.  He had no further seizures during his hospitalization.  He was initially admitted to the intensive care unit.  On 05/19/01, he was extubated, and was doing well afterwards.  Neurology consultation was obtained on 05/19/01, and the patient was transferred to the neurology service.  He was brought to the floor on 05/20/01, and continued to do well.  He ambulated without difficulty.  On the day of admission he gave a history that he normally obtains his medications from the [facility] and he had ordered his medications a couple of weeks ago, but they had not arrived yet.  Therefore, he had not had any Tegretol for about 5 days.  It was felt appropriate to place him back on his previous medication regimen.  Of note, he was initially on 450 mg of Tegretol t.i.d., but he was discharged on 400 mg of Tegretol t.i.d.  He was provided some extra medications through an indigent fund with the assistance of a Child psychotherapist.  He was felt stable for return to his assisted living facility, and was discharged on the afternoon of 05/21/01.  He was advised to call on Monday, 05/23/01, the [facility] to check on his medications and to follow up with them as scheduled. Dictated by:   Kelli Hope, M.D. Attending Physician:  Mick Sell  DD:  05/21/01 TD:  05/23/01 Job: [id number]

## 2010-12-05 NOTE — Op Note (Signed)
NAME:  Wyatt Salas, Wyatt Salas             ACCOUNT NO.:  [id number]   MEDICAL RECORD NO.:  [id number]          PATIENT TYPE:  AMB   LOCATION:  DSC                          FACILITY:  [facility]   PHYSICIAN:  Rodney A. Mortenson, M.D.DATE OF BIRTH:  08/23/1951   DATE OF PROCEDURE:  07/01/2006  DATE OF DISCHARGE:                               OPERATIVE REPORT   JUSTIFICATION:  A 60 year old male who fell three nights ago, injuring  his left ankle.  He was seen at [facility] and sustained a fracture  of the calcaneus.  He was placed in a posterior splint and noted to have  a fracture of the calcaneus with a total avulsion of the Achilles tendon  with proximal retraction.  He is now admitted for open reduction and  internal fixation.  Complications discussed preoperatively.  Questions  answered and encouraged.   JUSTIFICATION FOR OUTPATIENT SURGERY:  Minimal morbidity.   PREOPERATIVE DIAGNOSIS:  Fracture, left calcaneus with the proximal  retraction of Achilles tendon.   POSTOPERATIVE DIAGNOSIS:  Fracture, left calcaneus with the proximal  retraction of Achilles tendon.   OPERATION:  Open reduction, internal fixation using 6.5 screw to repair  the fracture of the calcaneus with an anatomic reduction.   SURGEON:  Lenard Galloway. Chaney Malling, M.D.   ANESTHESIA:  General.   PROCEDURE:  The patient placed on the operating table in supine position  with a pneumatic tourniquet about the left upper thigh.  The entire left  lower extremity prepped with DuraPrep and draped out in the usual  manner.  The leg was wrapped out with an Esmarch tourniquet, the  tourniquet was elevated.  The patient was done in a prone position.  An  incision made over the posterior aspect of the calcaneus, dissection  carried down to the calcaneus and the tendon sheath to the Achilles was  opened.  Blunt dissection was carried down to the main body of the  calcaneus.  A large avulsion fracture of the calcaneus was seen.   Loose  fragments of bone were removed.  The wound was irrigated with saline  solution.  Large blood clots were removed.  The large posterior superior  margin of the calcaneus could be put down in anatomic position quite  easily with the foot plantar flexed.  This got the Achilles tendon out  to an anatomical length.  A drill hole was placed through the large bony  fragment and a drill was placed in the calcaneus, and this was measured.  A 50 mm 6.5 mm screw and washer was then passed through a large  calcaneal fragment into the body of the calcaneus.  This brought the  large fragment into an anatomic position with excellent fixation.  The  Achilles tendon out to length and under normal tension.  X-ray was used  and an anatomic reduction was achieved and this screw extended from one  cortex to the distal cortex.  Excellent fixation was achieved.  The skin  was then closed with stainless steel staples.  A large bulky pressure  dressing and an anterior plaster splint with the foot in equinus was  applied and the patient returned to the recovery room in excellent  condition.  Technically his procedure went extremely well.   DRAINS:  None.   COMPLICATIONS:  None.   DISPOSITION:  1. Percocet for pain.  2. Totally nonweightbearing with walker.  3. Return to my office on Wednesday,           ______________________________  Lenard Galloway. Chaney Malling, M.D.     RAM/MEDQ  D:  07/01/2006  T:  07/01/2006  Job:  [id number]

## 2010-12-05 NOTE — Consult Note (Signed)
[place]. [facility]  Patient:    Wyatt Salas, Wyatt Salas Visit Number: [id number] MRN: [id number]          Service Type: MED Location: [phone] Attending Physician:  Lynann Bologna Dictated by:   Georges Lynch Darrelyn Hillock, M.D. Proc. Date: 08/09/01 Admit Date:  08/03/2001 Discharge Date: 08/11/2001   CC:         Jarome Matin, M.D.   Consultation Report  I was called to see Burhanuddin today.  He is 60 years old.  He has a seizure disorder.  He was seen by me today on August 09, 2001.  His chief complaint was pain in his left shoulder and pain in his left elbow.  He said he has fallen several times during the seizures that he has.  He had no other complaints from the orthopedic standpoint.  His past history is thoroughly documented on the chart by Dr. Bascom Levels and he does have a seizure disorder.  PHYSICAL EXAMINATION  EXTREMITIES:  Pain over the supraclavicular area on the left shoulder.  There are no masses, no tumors.  He had some pain with shoulder motion but his motion is complete.  In regards to his elbow he has pain over the olecranon. He is reluctant to completely flex and extend his elbow.  He has no signs of any skin breaks, no infections, etcetera.  LABORATORIES:  X-rays of the left elbow were normal.  There were no fractures. X-ray of his left shoulder shows an old fracture of his clavicle with a questionable non-union.  He had an MRI of that shoulder while in the hospital.  IMPRESSION: 1. 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 2. Contusion, left elbow.  TREATMENT:  He can have symptomatic treatment alone at this time and I will see him in the office as we discussed. Dictated by:   Georges Lynch Darrelyn Hillock, M.D. Attending Physician:  Lynann Bologna DD:  08/10/01 TD:  08/10/01 Job: [id number]

## 2010-12-05 NOTE — Discharge Summary (Signed)
[place]. [facility]  Patient:    LEBRON, NAUERT Visit Number: [id number] MRN: [id number]          Service Type: MED Location: [phone] Attending Physician:  Lynann Bologna Adm. Date:  02/25/2001 Disc. Date: 03/09/01                             Discharge Summary  ADMISSION DIAGNOSES:  Uncontrolled elevated blood sugar.  DISCHARGE DIAGNOSES:  New onset diabetic, status post subdural hematoma with craniotomy surgery, seizure disorder, hepatitis C.  HISTORY AND PHYSICAL:  This was an admission of this 60 year old African-American male.  He presented to the office earlier the month of admission and he had an increased blood sugar at that time.  Workup was being started for possible diabetes.  He has never been told before that he was diabetic and he was never treated for it.  Patient has a history of seizures that started after an accident in which he had a hematoma and had a craniotomy to evacuate.  Patient has been on Tegretol 200 mg two and a half b.i.d. and Keppra 500 mg b.i.d.  Patient is also on Pepcid 20 mg b.i.d. for some gastritis.  Patient was supposed to come back to the office and he showed up in the emergency room.  He had a blood sugar of 633.  He also was ketotic.  He was in diabetic ketoacidosis.  Patient was admitted.  PHYSICAL EXAMINATION  VITAL SIGNS:  Temperature 97.7, pulse 117, respirations 20, blood pressure 123/84.  GENERAL:  Patient was alert but he was obviously feeling sick and he stated his appetite was very poor.  HEENT:  Negative.  Sclera was muddy, but not yellow.  NECK:  Veins were flat.  CHEST:  Clear.  HEART:  Regular sinus rhythm to a sinus tachycardia.  ABDOMEN:  Soft.  No masses.  No organomegaly.  EXTREMITIES:  Moved all extremities.  Reflexes equal bilaterally.  Remainder of the examination was negative.  LABORATORIES:  Patient initially had a creatinine of 1.4 and his laboratories were  repeated and was 2.4.  His initial sodium was 131, potassium 5.2, chloride 98, glucose 633 (patient was acidotic), BUN 20, creatinine 2.5, calcium 9.3.  Moderate amount of acetone in his blood.  Patients urine had many bacteria.  Also had a moderate amount of bilirubin.  Glucose was greater than 1000.  He was put on some antibiotics after his urine was cultured; however, it came back insignificant growth.  Patients initial bilirubin was 10.  I am not sure whether that was because of his ketoacidosis or what.  His hemoglobin A1C was 11.5.  Cholesterol was 268.  ALT 48.  Amylase was normal. His initial total bilirubin was 10.2.  Gradually it came down to normal; 0.2 was the last one.  Potassium was 5.2, but came down as he was hydrated to 2.4. We gave him potassium and it came back up to normal.  Patient was given hepatitis workup and it came back positive for hepatitis C antibody.  HIV was done which was negative.  ID was asked to see the patient and it was decided he probably did not have active disease.  There seems to be some controversy how to approach the treatment of hepatitis C and since his HIV was negative, as his blood sugar comes under better control, decided we would probably discharge the patient back to [facility].  Sugars have been running in the 150-160 range.  Will probably have to fine tune his glucoses as an outpatient but we can do that.  He is on insulin.  Patient was put on Actos 15 mg tablet q.d. and Amaryl 4 mg q.d.  Also started him on Lantus insulin 10 units at night. His potassium kept running low so we put him on 20 mEq potassium t.i.d. and sorbitol one tablespoon q.d.  Patient was given some Dulcolax suppositories and his Tegretol was increased to t.i.d.  He is on Keppra 500 mg b.i.d. Patient was put on a Regular insulin sliding scale and used Darvocet for his headaches.  He periodically has headaches and while in the hospital one night he did have  some seizures and that is when we increased his Tegretol to 250 t.i.d.  He had some blood in his stool.  Barium enema was negative.  For several days we increased the potassium chloride to 20 mEq q.i.d. and then back down to t.i.d.  Patient started on diabetic teaching and will probably return on this regimen to [facility] and we will see him in two or three weeks in my office. Attending Physician:  Lynann Bologna DD:  03/08/01 TD:  03/08/01 Job: [id number]

## 2010-12-05 NOTE — Consult Note (Signed)
NAME:  Wyatt Salas, Wyatt Salas                       ACCOUNT NO.:  [id number]   MEDICAL RECORD NO.:  [id number]                   PATIENT TYPE:  INP   LOCATION:  4715                                 FACILITY:  [facility]   PHYSICIAN:  Deanna Artis. Sharene Skeans, M.D.           DATE OF BIRTH:  08/23/1951   DATE OF CONSULTATION:  DATE OF DISCHARGE:                                   CONSULTATION   CHIEF COMPLAINT:  Recurrent seizure.   HISTORY OF PRESENT ILLNESS:  The patient is an unfortunate 60 year old right-  handed divorced male who lives in assisted living at [facility] [place] in  [place], [place].  The patient was last seen by our service in  January 2003 when he was admitted to [facility] and seen  by Dr. Genene Churn. Love who noted that he had uncontrolled generalized  seizures.   Dr. Genene Churn. Love noted that the patient had evidence of left hemiparesis  with three to four over five strength on the left side and he could open and  close his left hand, touch his thumb with his index finger, stand on his  toes, and stand on his heels.  He had altered sensation on the left side in  comparison with the right and had evidence of a peripheral polyneuropathy.  He had a left reflex predominance and a left extensor plantar response.  His  gait was unsteady and revealed his left hemiparesis.   Workup at that time showed CT scan of the brain with right temporoparietal  encephalomalacia, advanced atrophy for the patient's age, MI of the brain,  showed chronic and remote encephalomalacia and atrophy involving the right  hemisphere, mainly the right temporal lobe without acute intracranial  changes.  MRA in the three shoulder view showed a remote fracture in the  distal left aspect of the left clavicle with osteoporotic changes.  Question  of a pathologic fracture in the distal clavicle.  Recommendations were made  to carry out MRI of the left shoulder which was not done.   The patient  also had lipid panel which showed an elevated cholesterol of  201, elevated LDL of 136.  Otherwise, subcategories and triglycerides were  normal.  Hemoglobin A1C was 6.4, also in the normal range.  Carbamazepine  rose from 7 on August 03, 2001 to 15.6 on August 08, 2001.  It got back on  down to 8.5 on August 10, 2001.   The patient was discharged on 1200 mg of Tegretol per day.  I do not know  that any further MRI scans were carried out.  The patient also had an EEG  ordered but I cannot find results in the chart.   The patient had also been on Keppra.  I cannot find evidence that Keppra was  actually restarted, although I know that it has been based on speaking with  the [facility].   The patient  was over at [facility] having a GI procedure when he had a  generalized tonoclonic seizure.  This lasted two to three minutes.  He was  transported back to [facility] and had a second witnessed  seizure at midnight that began with staring, deviated to the left side.  He  was not able to answer.  After a few seconds, he had a generalized  tonoclonic seizure lasting three minutes.   The patient was treated with Ativan.  He had significant hypertension  following the episode with blood pressure up to 199/113 and this gradually  dropped through the night.   In the morning, the patient had a visual hallucination of another woman in  the bed next to him in the room.  He is in a single bedroom.   The patient's Tegretol level on admission was 17.1.  This may very well have  been a peak level.  His level this morning was 10.6 which presents a top  level.  The patient was noted to have a sodium of 123, which has risen to  127.  This was a Tegretol effect that causes mild inappropriate secretion of  antidiuretic hormone.  The patient had mild elevation of his liver function  with an SGPT of 50.  He has a mild anemia but is stable.  Basically a normal  white count.   A  head CT scan showed no changes since January 2003.  He again showed  encephalomalacia in the right temporoparietal regions.   I was asked to see him to comment on the seizures and to make  recommendations for further workup and treatment.  Today, the patient  complains of sharp pains shooting from his left toe up to his left knee and  also pain that extends from his left shoulder down to his arm.  He says that  he is weaker on the left side than he has been before.  We know that he fell  because he has a chest wound on his right knee.  Whether or not he strained  himself during this time otherwise is unclear.  The patient said that he had  blurred vision and a headache before he had the seizure yesterday.  He think  that he hit the floor.   PAST MEDICAL HISTORY:  Past medical history is remarkable for noninsulin-  dependent diabetes mellitus, hepatitis C.  The subdural hematoma happened in  2000 when he had a closed head injury falling down the stairs.  He had a  prolonged hospitalization requiring percutaneous gastrostomy placement as  well as tracheostomy.  He was rehabilitated at the time with the  rehabilitation unit in [place], [place].   The patient had been on Keppra and Tegretol in May 2002.  The patient  required assistance in cutting meat in his tray.  He is able to bare weight  with some assistance on his legs.  He has been fairly independent at [facility] but receives assistance from the nurses in terms of getting him his  medication.  Hence, it is hard to understand why he became Tegretol toxic.  In addition to type 2 diabetes mellitus, it appears that the patient also  has gastroesophageal reflux disease, hypertension, headaches, and  constipation.   CURRENT MEDICATIONS:  1. According to [facility] include sliding scale insulin.  2. Ranitidine 150 mg b.i.d.  3. Togrems 1 q.d. 4. Hydrochlorothiazide 25 mg q.d.  5. Claritin 10 mg q.d.  6. Carbamazepine 300  mg  2-1/2 p.o. t.i.d.  7. Colace 100 mg b.i.d.  8. Glyburide 5 mg q.d.  9. Lisinopril 5 mg q.d.  10.      Generic Butalbital 1 q.i.d. p.r.n.  11.      Keppra 500 mg b.i.d.  The people at [facility] that Keppra had been     discontinued see above.  12.      Darvocet-N 100 with 1 q.4-6h. p.r.n.   REVIEW OF SYSTEMS:  Review of systems is otherwise negative except as noted  above.   FAMILY HISTORY:  There is no history of seizures.  Mother died from an  artery rupture to her heart in her 74s.  Father died in his 12s of unknown  causes.   SOCIAL HISTORY:  The patient lives at [facility].  He does not use  tobacco but smoked two packs per day for a number of years.  He says he  previously drank a case of beer per day and used cocaine a few years back  for five years.   The patient does not drive.  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   PHYSICAL EXAMINATION:  GENERAL:  Lethargic African American gentleman, right-  handed in no acute distress.  VITAL SIGNS:  Temperature 99.1, pulse 103, blood pressure 106/52,  respirations 18.  Pulse oximetry 94% on room air.  HEENT:  No signs of infection.  There is cerumen occluding his external  auditory canals.  Oropharynx is unremarkable.  Conjunctivae are negative.  NECK:  Supple.  Fairly full range of motion.  No bruits were noted.  The  patient had bilateral TMJ pain.  LUNGS:  Clear to auscultation.  HEART:  Tachycardia.  No murmur.  PULSES:  Normal.  ABDOMEN:  Soft and nontender.  Bowel sounds normal.  EXTREMITIES:  Well formed with what appears to be decreased tone on the left  side.  The patient says that it hurts when I squeeze the muscles in his left  arm and leg.  SKIN:  No lesions.  NEUROLOGIC:  Mental status shows the patient was awake.  He kept talking  about the medicine being here.  I think that he may be confused of the  location.  It was very hard to probe that because he has very little say.  He was able to name objects and  follow commands.  He was oriented to name,  year, and month.  He thought that he might be at [facility].  I reminded him  that he was at [facility].  Cranial nerves show round,m reactive pupils.  Normal fundi.  Visual fields  are full to double simultaneous stimuli and to counting fingers.  Extraocular movements full and conjugate.  OKN responses were poor.  The  patient has a left central 7th, midline tongue, and uvula.  Air conduction  greater than bone conduction bilaterally.  Motor examination shows normal  strength on the right.  On the left side, he can barely lift the left arm at  the level of the shoulder and can move his arm through the air from the  outstretched position to his nose and opposed his thumb to his index finger.  He would not formerly test her biceps or triceps, although he did slowly formerly when he reached from his nose to my fingers.  The right side  appears to be entirely normal though the right leg is also entirely normal.  The left leg has  three to four over five strength in the proximal and a  definite foot drop on the left distally with a tight heel cord.  Sensation  seems to be somewhat altered on the left side but it is not consistent.  The  patient had good stereognosis on the right hand, astereognosis on the left.  Deep tendon reflexes show a left-sided reflex predominance.  The patient had  right flexor and a left extensor plantar response.  I could not check his  gait.   IMPRESSION:  Recurrent partial seizures with secondary generalizations,  345.40, 345.10.  The last seizure that he can recall was back in January.  I  do not know if he truly recalls this with the problems that he has memory  and his mentation.  As a matter of fact, I determined that 1500 mg a day may  be too high a dose for him to tolerate. We are going to try 400 mg t.i.d.  and see what happens with his Tegretol levels.  We will also try restart his  Keppra.   The  patient needs a MRI scan in order to be certain that there no acute  process has occurred causing pain in the left side and also increased  weakness.  This is certainly not revealed on the CT scan and I suspect that  it will not on the MRI either.  I appreciate the opportunity to see him.  We  will watch the Tegretol level and give further advice after he sees the MRI  scan.  Dr. Porfirio Mylar Dohmeier is on call for the group this weekend.                                               Deanna Artis. Sharene Skeans, M.D.    [facility]  D:  04/06/2002  T:  04/09/2002  Job:  [id number]   cc:   Jarome Matin, M.D.  [address] [place]  [place] [address]  Fax: [phone]

## 2010-12-05 NOTE — Discharge Summary (Signed)
NAME:  Wyatt Salas, Wyatt Salas                       ACCOUNT NO.:  [id number]   MEDICAL RECORD NO.:  [id number]                   PATIENT TYPE:  INP   LOCATION:  4715                                 FACILITY:  [facility]   PHYSICIAN:  Jarome Matin, M.D.            DATE OF BIRTH:  08/23/1951   DATE OF ADMISSION:  04/05/2002  DATE OF DISCHARGE:  04/12/2002                                 DISCHARGE SUMMARY   ADMISSION DIAGNOSIS:  Seizure and headaches.   DISCHARGE DIAGNOSES:  1. Drug interaction and toxicity of mixing Darvon and Tegretol.  2. Seizure activity.  3. Hepatitis C.  4. Diabetes.  5. Tegretol toxicity secondary to Darvon interaction.   HISTORY AND PHYSICAL:  The patient is a 60 year old African-American male  who lives at [facility]. The patient apparently fell down and had  a seizure and EMT was called. He was brought into the emergency room at  [facility] where he was observed to have a seizure. The patient has had a  seizure in the past. He had also had a history of a subdural hematoma in the  past from trauma to his head. He had been treated with Tegretol and Keppra.  When brought into the emergency room they found that he had an elevated  Tegretol level of 17.1 mg/ml and the upper limits of therapeutic range is  12.0 mg/ml. The patient, as stated before, had a history of a CVA and  subdural hematoma and also had a history of a left clavicular fracture. He  is a hepatitis C patient. The patient has used cocaine in the past, however,  there is no evidence of him using it now.   PHYSICAL EXAMINATION:   VITAL SIGNS:  Temperature 98.7, pulse 106, respiratory rate 16, blood  pressure 141/93.   GENERAL:  Was very sluggish, but then he would talk once you aroused him. He  was not observed having seizures by myself, but he was observed by the EMT.   HEENT:  Pupils were sluggishly reactive.   CHEST:  Clear to auscultation and percussion. Regular sinus rhythm.   ABDOMEN:  Soft. No masses, no organomegaly, active bowel sounds.   GENITOURINARY:  Normal male genitalia.    HOSPITAL COURSE:  The patient's CT of the brain was performed. There were no  acute changes. The patient was checked and found to have a very high level  of the breakdown product of Tegretol. We subsequently found that Darvon  caused increased levels of Tegretol. That was not known and he had been  using Darvon for pain. This was found out by the neurology consult that we  did that.  Jarome Matin, M.D.    CEF/MEDQ  D:  04/27/2002  T:  04/27/2002  Job:  [id number]

## 2010-12-05 NOTE — Discharge Summary (Signed)
NAME:  Wyatt Salas, Wyatt Salas             ACCOUNT NO.:  [id number]   MEDICAL RECORD NO.:  [id number]          PATIENT TYPE:  INP   LOCATION:  3016                         FACILITY:  [facility]   PHYSICIAN:  Alvester Morin, M.D.  DATE OF BIRTH:  08/23/1951   DATE OF ADMISSION:  06/04/2008  DATE OF DISCHARGE:  06/06/2008                               DISCHARGE SUMMARY   DISCHARGE DIAGNOSES:  1. Recurrent seizures.  2. Hyponatremia.  3. Diabetes.  4. Hypertension.  5. History of hepatitis C.  6. Gastroesophageal reflux disease.  7. History of enterococcal urinary tract infection.  8. Hyperlipidemia.  9. History of subdural hematoma.  10.History of traumatic brain injury.   DISCHARGE MEDICATIONS:  1. Glyburide 5 mg 1 tablet p.o. daily.  2. Prozac 20 mg 1 tablet p.o. daily.  3. Lisinopril 10 mg 1 tablet p.o. daily.  4. Avandia 4 mg 1 tablet p.o. daily.  5. Detrol  4 mg 1 tablet p.o. daily.  6. Galantamine 16 mg 1 tablet p.o. daily.  7. Keppra 500 mg 1 tablet p.o. q.12 h.   CONDITION AT DISCHARGE:  The patient has responded well to treatment.   The patient will follow up with Dr. Jules Husbands at [facility] on June 18, 2008, at  10 a.m.   PROCEDURES:  1. CT of the head without contrast.  Impression:  A.  Status post right craniotomy.  B.  Stable encephalomalacia on the right upper lobe.  C.  Stable chronic microvascular white matter changes.  D.  No acute findings or interval change.  1. Chest X-Ray.  Impression:  No active disease.  1. MRI of the brain with and without contrast.  Impression:  No acute reversible process.  Extensive atrophy and  gliomatosis within the right hemisphere as described above.  Small  vessel changes elsewhere bilaterally.   HISTORY AND PHYSICAL/HISTORY OF PRESENT ILLNESS:  A 60 year old man with  past medical history of underlying traumatic brain injury and seizure  foci and multiple hospitalizations for seizures, and multiple injuries  to fall secondary to  seizures, comes to the hospital because of altered  mental status.  The patient was found by a friend at 6 a.m.  EMS was  called and reported the patient to be postictal.  Friend witnessed  seizure activity that was described as appearing to be tonic clonic.  The patient also experienced urinary incontinence and was obtunded by  protected airway.  There were no injuries or trauma.  No meds were given  in ED and there was no additional seizure activity.  The patient  complains of urinary frequency, but denies nausea, vomiting, diarrhea,  dysuria, weakness, dizziness, or fever.  The patient states he wakes up  2 times during the night to urinate.  The patient states he takes all  his medication as indicated.   PHYSICAL EXAMINATION:  VITAL SIGNS:  Temperature 97.9, blood pressure  188/116, pulse 81, respiratory rate 22, and oxygen saturation 100% on  room air.  GENERAL:  NAD, stuporous, drowsy, wakes to loud noise, gentle  stimulation, and shaking.  EYES:  PERRLA, cannot cooperate with EOMI.  ENT: Poor dentition.  HEAD:  Normocephalic and atraumatic.  NECK:  Supple, no JVD.  RESPIRATORY:  Scattered rhonchi.  Good air movement.  CARDIOVASCULAR:  3-6 systolic ejection murmur heard over apex.  S1 and  S2 regular.  GI:  Slight tender to palpation over epigastric/upper quadrants.  No  distention.  Bowel sounds positive.  No rebound.  EXTREMITIES:  No edema.  SKIN:  Very dry, scaly/poor grooming, no rashes.  LYMPH:  No lymphadenopathy.  MUSCULOSKELETAL:  Limited range of motion in left leg.  NEUROLOGICAL:  4/5 strength in all extremities, light touch sensation  altered on left side, left-sided facial droop noticed when asked to  smile, cannot focus, left hand with intrinsic muscle atrophy and left  arm with deformity.   ADMISSION LABORATORY STUDIES:  Sodium 120, potassium 4.6, chloride 87,  bicarb 24, BUN 6, creatinine 0.5, and glucose 131.  White blood cells  5.2, hemoglobin 13.9,  hematocrit 43, and platelets 206.  Bilirubin 0.5,  alkaline phosphatase 63, AST 59, ALT 63, protein 6.8, albumin 3.8, and  calcium 9.4.   HOSPITAL COURSE:  1. Recurrent seizures.  Most likely, this episode is secondary to      hyponatremia which was probably caused by supratherapeutic      Tegretol.  Tegretol was changed to Keppra because of increased      liver function tests.  The patient did not experience any further      seizure activity while hospitalized.  No signs of MI or injury in      EKG.  Cardiac enzymes were negative.  CT and MRI did not show any      acute abnormalities which ruled out CVA.  No signs of infection on      urinalysis or chest x-ray.  No leukocytosis and the patient was      afebrile throughout hospitalization.  UDS was negative.  HIV was      nonreactive.  Thyroid-stimulating hormone was within normal range.      Hemoglobin A1c was 5.4 which suggested the patient properly      controlling blood glucose levels.  The patient will be discharged      on Keppra.  The patient upon discharge was at baseline mental      status.  The patient will follow up with Dr. Jules Husbands at the [facility].  2. 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  The      patient was put on free water restriction and sodium increased      slowly over 24 hours from 120-130.  Due to adverse effects of      Tegretol, the patient will be changed to Keppra and will continue      this medication on an outpatient basis as well.  The patient's      sodium at discharge was 131.  The patient will follow up with Dr.      Jules Husbands for any further management.  3. Hypertension.  Initially upon admission, the patient's blood      pressure had been elevated.  On second day of admission, blood      pressure came down to 121/73 and remained stable throughout      hospitalization.  The patient will continue with his home dose of      medications on an outpatient basis and will follow up with primary       care Ravon Mcilhenny for further management.  4. Diabetes.  Throughout hospitalization, there was good  control of      blood glucose levels.  The patient will continue with home dose      medications upon discharge.  The patient will follow up with      primary care Myran Arcia for further management.   DISCHARGE VITAL SIGNS:  Temperature 97.9, blood pressure 125/83, pulse  63, respiratory rate 18, and oxygen saturation 98% on room air.   DISCHARGE LABORATORY STUDIES:  White blood cells 6.5, hemoglobin 14.6,  hematocrit 45.0, and platelets 198.  Sodium 131, potassium 4.4, chloride  96, bicarb 27, BUN 3, creatinine 0.73, and glucose 122.      Danne Harbor, MD  Electronically Signed      Alvester Morin, M.D.  Electronically Signed    RV/MEDQ  D:  08/09/2008  T:  08/10/2008  Job:  [id number]

## 2011-04-22 LAB — PROTIME-INR
INR: 1
Prothrombin Time: 13.6

## 2011-04-22 LAB — CBC
HCT: 43
HCT: 43.2
Hemoglobin: 13.9
Hemoglobin: 14.9
MCHC: 32.5
MCHC: 32.5
MCHC: 32.7
MCHC: 33.8
MCV: 88.1
MCV: 88.4
MCV: 89.9
Platelets: 202
Platelets: 233
Platelets: 264
RBC: 4.39
RBC: 4.74
RBC: 4.83
RDW: 13.3
RDW: 13.5
RDW: 13.6
RDW: 14
WBC: 5.2

## 2011-04-22 LAB — URINALYSIS, MICROSCOPIC ONLY
Glucose, UA: NEGATIVE
Leukocytes, UA: NEGATIVE
Protein, ur: NEGATIVE
pH: 7

## 2011-04-22 LAB — HEPATITIS PANEL, ACUTE
HCV Ab: REACTIVE — AB
Hep A IgM: NEGATIVE

## 2011-04-22 LAB — BASIC METABOLIC PANEL
BUN: 3 — ABNORMAL LOW
BUN: 3 — ABNORMAL LOW
CO2: 23
CO2: 27
CO2: 27
Calcium: 10.1
Chloride: 94 — ABNORMAL LOW
Creatinine, Ser: 0.73
GFR calc Af Amer: >60
GFR calc non Af Amer: >60
Glucose, Bld: 118 — ABNORMAL HIGH
Glucose, Bld: 122 — ABNORMAL HIGH
Potassium: 4.4
Sodium: 126 — ABNORMAL LOW

## 2011-04-22 LAB — COMPREHENSIVE METABOLIC PANEL
ALT: 90 — ABNORMAL HIGH
ALT: 90 — ABNORMAL HIGH
AST: 45 — ABNORMAL HIGH
AST: 69 — ABNORMAL HIGH
Albumin: 3.9
Albumin: 4.1
Alkaline Phosphatase: 63
Alkaline Phosphatase: 69
Alkaline Phosphatase: 79
BUN: 11
BUN: 5 — ABNORMAL LOW
BUN: 6
CO2: 24
CO2: 25
CO2: 26
Calcium: 9.5
Calcium: 9.5
Calcium: 9.5
Chloride: 87 — ABNORMAL LOW
Chloride: 94 — ABNORMAL LOW
Creatinine, Ser: 0.6
Creatinine, Ser: 0.61
Creatinine, Ser: 0.92
GFR calc Af Amer: >60
GFR calc Af Amer: >60
GFR calc non Af Amer: >60
GFR calc non Af Amer: >60
Glucose, Bld: 131 — ABNORMAL HIGH
Glucose, Bld: 72
Potassium: 3.7
Potassium: 4.4
Potassium: 4.6
Sodium: 127 — ABNORMAL LOW
Sodium: 128 — ABNORMAL LOW
Total Bilirubin: 0.5
Total Bilirubin: 0.9
Total Protein: 6.9
Total Protein: 7.7
Total Protein: 8.4 — ABNORMAL HIGH

## 2011-04-22 LAB — GLUCOSE, CAPILLARY
Glucose-Capillary: 109 — ABNORMAL HIGH
Glucose-Capillary: 113 — ABNORMAL HIGH
Glucose-Capillary: 119 — ABNORMAL HIGH
Glucose-Capillary: 119 — ABNORMAL HIGH
Glucose-Capillary: 123 — ABNORMAL HIGH
Glucose-Capillary: 125 — ABNORMAL HIGH
Glucose-Capillary: 139 — ABNORMAL HIGH
Glucose-Capillary: 42 — ABNORMAL LOW
Glucose-Capillary: 47 — ABNORMAL LOW
Glucose-Capillary: 60 — ABNORMAL LOW

## 2011-04-22 LAB — CARBAMAZEPINE LEVEL, TOTAL
Carbamazepine Lvl: 13.5 — ABNORMAL HIGH
Carbamazepine Lvl: <2 — ABNORMAL LOW

## 2011-04-22 LAB — TROPONIN I: Troponin I: 0.02

## 2011-04-22 LAB — LIPID PANEL: Cholesterol: 160

## 2011-04-22 LAB — DIFFERENTIAL
Basophils Absolute: 0
Basophils Relative: 0
Basophils Relative: 1
Eosinophils Absolute: 0.2
Lymphs Abs: 2.5
Monocytes Absolute: 0.9
Monocytes Relative: 9
Neutro Abs: 2.8
Neutro Abs: 6.7
Neutrophils Relative %: 60

## 2011-04-22 LAB — PHENOBARBITAL LEVEL: Phenobarbital: <5 — ABNORMAL LOW

## 2011-04-22 LAB — ETHANOL
Alcohol, Ethyl (B): <5
Alcohol, Ethyl (B): <5

## 2011-04-22 LAB — CREATININE, URINE, RANDOM: Creatinine, Urine: 22.8

## 2011-04-22 LAB — RAPID URINE DRUG SCREEN, HOSP PERFORMED
Amphetamines: NOT DETECTED
Benzodiazepines: NOT DETECTED
Cocaine: NOT DETECTED
Tetrahydrocannabinol: NOT DETECTED

## 2011-04-22 LAB — AMMONIA: Ammonia: 26

## 2011-04-22 LAB — POCT I-STAT, CHEM 8
BUN: 4 — ABNORMAL LOW
Calcium, Ion: 1.2
Glucose, Bld: 132 — ABNORMAL HIGH
TCO2: 25

## 2011-04-22 LAB — URINALYSIS, ROUTINE W REFLEX MICROSCOPIC
Bilirubin Urine: NEGATIVE
Glucose, UA: NEGATIVE
Ketones, ur: NEGATIVE
pH: 7

## 2011-04-22 LAB — SODIUM, URINE, RANDOM: Sodium, Ur: 41

## 2011-04-22 LAB — CK TOTAL AND CKMB (NOT AT ARMC)
CK, MB: 0.8
Relative Index: INVALID

## 2011-04-22 LAB — HEMOGLOBIN A1C: Hgb A1c MFr Bld: 5.4

## 2011-04-22 LAB — APTT
aPTT: 28
aPTT: 28

## 2011-04-24 LAB — DIFFERENTIAL
Basophils Absolute: 0 10*3/uL (ref 0.0–0.1)
Basophils Absolute: 0.1 10*3/uL (ref 0.0–0.1)
Eosinophils Absolute: 0.3 10*3/uL (ref 0.0–0.7)
Eosinophils Relative: 2 % (ref 0–5)
Lymphocytes Relative: 17 % (ref 12–46)
Monocytes Absolute: 0.5 10*3/uL (ref 0.1–1.0)
Neutro Abs: 7 10*3/uL (ref 1.7–7.7)

## 2011-04-24 LAB — BASIC METABOLIC PANEL
BUN: 2 mg/dL — ABNORMAL LOW (ref 6–23)
BUN: 5 mg/dL — ABNORMAL LOW (ref 6–23)
CO2: 27 mEq/L (ref 19–32)
CO2: 28 mEq/L (ref 19–32)
Calcium: 8.4 mg/dL (ref 8.4–10.5)
Calcium: 9 mg/dL (ref 8.4–10.5)
Chloride: 93 mEq/L — ABNORMAL LOW (ref 96–112)
Chloride: 97 mEq/L (ref 96–112)
Creatinine, Ser: 0.58 mg/dL (ref 0.4–1.5)
Creatinine, Ser: 0.59 mg/dL (ref 0.4–1.5)
GFR calc Af Amer: >60 mL/min (ref >60)
GFR calc Af Amer: >60 mL/min (ref >60)
GFR calc non Af Amer: >60 mL/min (ref >60)
GFR calc non Af Amer: >60 mL/min (ref >60)
Glucose, Bld: 183 mg/dL — ABNORMAL HIGH (ref 70–99)
Glucose, Bld: 227 mg/dL — ABNORMAL HIGH (ref 70–99)
Potassium: 3.7 mEq/L (ref 3.5–5.1)
Sodium: 127 mEq/L — ABNORMAL LOW (ref 135–145)
Sodium: 135 mEq/L (ref 135–145)

## 2011-04-24 LAB — CBC
HCT: 35.8 % — ABNORMAL LOW (ref 39.0–52.0)
HCT: 36 % — ABNORMAL LOW (ref 39.0–52.0)
HCT: 39.6 % (ref 39.0–52.0)
Hemoglobin: 11.2 g/dL — ABNORMAL LOW (ref 13.0–17.0)
Hemoglobin: 11.6 g/dL — ABNORMAL LOW (ref 13.0–17.0)
Hemoglobin: 11.6 g/dL — ABNORMAL LOW (ref 13.0–17.0)
MCHC: 32.2 g/dL (ref 30.0–36.0)
MCHC: 32.8 g/dL (ref 30.0–36.0)
MCHC: 33 g/dL (ref 30.0–36.0)
MCV: 88.3 fL (ref 78.0–100.0)
MCV: 88.5 fL (ref 78.0–100.0)
MCV: 88.9 fL (ref 78.0–100.0)
MCV: 89.1 fL (ref 78.0–100.0)
MCV: 89.8 fL (ref 78.0–100.0)
Platelets: 231 10*3/uL (ref 150–400)
Platelets: 257 10*3/uL (ref 150–400)
RBC: 3.81 MIL/uL — ABNORMAL LOW (ref 4.22–5.81)
RBC: 3.95 MIL/uL — ABNORMAL LOW (ref 4.22–5.81)
RBC: 4 MIL/uL — ABNORMAL LOW (ref 4.22–5.81)
RBC: 4.06 MIL/uL — ABNORMAL LOW (ref 4.22–5.81)
RBC: 4.31 MIL/uL (ref 4.22–5.81)
RDW: 12.8 % (ref 11.5–15.5)
RDW: 13 % (ref 11.5–15.5)
RDW: 13.1 % (ref 11.5–15.5)
RDW: 13.1 % (ref 11.5–15.5)
RDW: 13.2 % (ref 11.5–15.5)
WBC: 7.2 10*3/uL (ref 4.0–10.5)
WBC: 9.7 10*3/uL (ref 4.0–10.5)

## 2011-04-24 LAB — CSF CELL COUNT WITH DIFFERENTIAL
RBC Count, CSF: 1 /mm3 — ABNORMAL HIGH
RBC Count, CSF: 7 /mm3 — ABNORMAL HIGH
Tube #: 1
Tube #: 4

## 2011-04-24 LAB — POTASSIUM: Potassium: 3.3 mEq/L — ABNORMAL LOW (ref 3.5–5.1)

## 2011-04-24 LAB — TSH: TSH: 1.44 u[IU]/mL (ref 0.350–4.500)

## 2011-04-24 LAB — HEMOGLOBIN A1C: Mean Plasma Glucose: 117 mg/dL

## 2011-04-24 LAB — COMPREHENSIVE METABOLIC PANEL
ALT: 53 U/L (ref 0–53)
ALT: 53 U/L (ref 0–53)
AST: 39 U/L — ABNORMAL HIGH (ref 0–37)
AST: 42 U/L — ABNORMAL HIGH (ref 0–37)
Albumin: 2.9 g/dL — ABNORMAL LOW (ref 3.5–5.2)
Albumin: 3.1 g/dL — ABNORMAL LOW (ref 3.5–5.2)
Alkaline Phosphatase: 58 U/L (ref 39–117)
BUN: 4 mg/dL — ABNORMAL LOW (ref 6–23)
CO2: 30 mEq/L (ref 19–32)
Calcium: 9.2 mg/dL (ref 8.4–10.5)
Chloride: 102 mEq/L (ref 96–112)
Chloride: 104 mEq/L (ref 96–112)
Chloride: 97 mEq/L (ref 96–112)
Creatinine, Ser: 0.59 mg/dL (ref 0.4–1.5)
Creatinine, Ser: 0.67 mg/dL (ref 0.4–1.5)
Creatinine, Ser: 0.73 mg/dL (ref 0.4–1.5)
GFR calc Af Amer: >60 mL/min (ref >60)
GFR calc Af Amer: >60 mL/min (ref >60)
GFR calc non Af Amer: >60 mL/min (ref >60)
GFR calc non Af Amer: >60 mL/min (ref >60)
Glucose, Bld: 182 mg/dL — ABNORMAL HIGH (ref 70–99)
Potassium: 3.7 mEq/L (ref 3.5–5.1)
Potassium: 3.8 mEq/L (ref 3.5–5.1)
Sodium: 131 mEq/L — ABNORMAL LOW (ref 135–145)
Sodium: 134 mEq/L — ABNORMAL LOW (ref 135–145)
Total Bilirubin: 0.8 mg/dL (ref 0.3–1.2)
Total Bilirubin: 0.9 mg/dL (ref 0.3–1.2)
Total Protein: 6.3 g/dL (ref 6.0–8.3)
Total Protein: 6.5 g/dL (ref 6.0–8.3)

## 2011-04-24 LAB — GLUCOSE, CAPILLARY
Glucose-Capillary: 107 mg/dL — ABNORMAL HIGH (ref 70–99)
Glucose-Capillary: 115 mg/dL — ABNORMAL HIGH (ref 70–99)
Glucose-Capillary: 120 mg/dL — ABNORMAL HIGH (ref 70–99)
Glucose-Capillary: 130 mg/dL — ABNORMAL HIGH (ref 70–99)
Glucose-Capillary: 137 mg/dL — ABNORMAL HIGH (ref 70–99)
Glucose-Capillary: 144 mg/dL — ABNORMAL HIGH (ref 70–99)
Glucose-Capillary: 149 mg/dL — ABNORMAL HIGH (ref 70–99)
Glucose-Capillary: 152 mg/dL — ABNORMAL HIGH (ref 70–99)
Glucose-Capillary: 154 mg/dL — ABNORMAL HIGH (ref 70–99)
Glucose-Capillary: 160 mg/dL — ABNORMAL HIGH (ref 70–99)
Glucose-Capillary: 165 mg/dL — ABNORMAL HIGH (ref 70–99)
Glucose-Capillary: 170 mg/dL — ABNORMAL HIGH (ref 70–99)
Glucose-Capillary: 178 mg/dL — ABNORMAL HIGH (ref 70–99)
Glucose-Capillary: 210 mg/dL — ABNORMAL HIGH (ref 70–99)
Glucose-Capillary: 218 mg/dL — ABNORMAL HIGH (ref 70–99)
Glucose-Capillary: 221 mg/dL — ABNORMAL HIGH (ref 70–99)
Glucose-Capillary: 242 mg/dL — ABNORMAL HIGH (ref 70–99)
Glucose-Capillary: 252 mg/dL — ABNORMAL HIGH (ref 70–99)
Glucose-Capillary: 86 mg/dL (ref 70–99)
Glucose-Capillary: 95 mg/dL (ref 70–99)

## 2011-04-24 LAB — CULTURE, BLOOD (ROUTINE X 2): Culture: NO GROWTH

## 2011-04-24 LAB — VANCOMYCIN, TROUGH: Vancomycin Tr: 14.3 ug/mL (ref 10.0–20.0)

## 2011-04-24 LAB — CSF CULTURE W GRAM STAIN

## 2011-04-24 LAB — RPR: RPR Ser Ql: NONREACTIVE

## 2011-04-24 LAB — URINE CULTURE: Colony Count: 90000

## 2011-04-24 LAB — ETHANOL
Alcohol, Ethyl (B): <5 mg/dL (ref 0–10)
Alcohol, Ethyl (B): <5 mg/dL (ref 0–10)

## 2011-04-24 LAB — URINALYSIS, ROUTINE W REFLEX MICROSCOPIC
Glucose, UA: NEGATIVE mg/dL
Hgb urine dipstick: NEGATIVE
pH: 7.5 (ref 5.0–8.0)

## 2011-04-24 LAB — VITAMIN B12: Vitamin B-12: 362 pg/mL (ref 211–911)

## 2011-04-24 LAB — PHOSPHORUS: Phosphorus: 2.8 mg/dL (ref 2.3–4.6)

## 2011-04-24 LAB — VDRL, CSF: VDRL Quant, CSF: NONREACTIVE

## 2011-04-24 LAB — HSV PCR
HSV 2 , PCR: NOT DETECTED
HSV, PCR: NOT DETECTED

## 2011-04-24 LAB — URINE MICROSCOPIC-ADD ON

## 2011-04-24 LAB — CK TOTAL AND CKMB (NOT AT ARMC): Relative Index: INVALID (ref 0.0–2.5)

## 2011-04-24 LAB — RAPID URINE DRUG SCREEN, HOSP PERFORMED
Barbiturates: NOT DETECTED
Opiates: NOT DETECTED

## 2011-04-24 LAB — MAGNESIUM: Magnesium: 1.8 mg/dL (ref 1.5–2.5)

## 2011-04-24 LAB — PROTEIN AND GLUCOSE, CSF: Glucose, CSF: 79 mg/dL — ABNORMAL HIGH (ref 43–76)

## 2011-05-01 LAB — URINE MICROSCOPIC-ADD ON

## 2011-05-01 LAB — RAPID URINE DRUG SCREEN, HOSP PERFORMED
Barbiturates: NOT DETECTED
Cocaine: NOT DETECTED
Opiates: NOT DETECTED

## 2011-05-01 LAB — URINALYSIS, ROUTINE W REFLEX MICROSCOPIC
Bilirubin Urine: NEGATIVE
Glucose, UA: 100 — AB
Specific Gravity, Urine: 1.018
pH: 7.5

## 2011-05-01 LAB — BASIC METABOLIC PANEL
BUN: 3 — ABNORMAL LOW
BUN: 5 — ABNORMAL LOW
CO2: 23
CO2: 27
Calcium: 8.7
Calcium: 9.5
Chloride: 93 — ABNORMAL LOW
Chloride: 95 — ABNORMAL LOW
Chloride: 97
Creatinine, Ser: 0.64
Creatinine, Ser: 0.65
Creatinine, Ser: 0.68
GFR calc Af Amer: >60
GFR calc non Af Amer: >60
Glucose, Bld: 109 — ABNORMAL HIGH
Glucose, Bld: 153 — ABNORMAL HIGH
Glucose, Bld: 156 — ABNORMAL HIGH
Potassium: 4.4
Potassium: 5.1
Sodium: 130 — ABNORMAL LOW

## 2011-05-01 LAB — CBC
HCT: 41.7
Hemoglobin: 13.7
MCV: 86.6
MCV: 86.8
RBC: 4.9
WBC: 7.2
WBC: 8.2

## 2011-05-01 LAB — COMPREHENSIVE METABOLIC PANEL
Alkaline Phosphatase: 66
BUN: 2 — ABNORMAL LOW
CO2: 22
Chloride: 92 — ABNORMAL LOW
GFR calc non Af Amer: >60
Glucose, Bld: 139 — ABNORMAL HIGH
Potassium: 4
Total Bilirubin: 0.8

## 2011-05-01 LAB — DIFFERENTIAL
Basophils Absolute: 0
Basophils Relative: 0
Monocytes Absolute: 0.4
Neutro Abs: 6.6
Neutrophils Relative %: 80 — ABNORMAL HIGH

## 2011-05-01 LAB — TROPONIN I: Troponin I: 0.03

## 2011-05-01 LAB — OSMOLALITY, URINE: Osmolality, Ur: 551

## 2011-05-01 LAB — ETHANOL: Alcohol, Ethyl (B): <5

## 2011-05-01 LAB — CARBAMAZEPINE LEVEL, TOTAL: Carbamazepine Lvl: 7

## 2011-05-01 LAB — SODIUM: Sodium: 126 — ABNORMAL LOW

## 2011-05-01 LAB — SODIUM, URINE, RANDOM: Sodium, Ur: 110

## 2011-05-01 LAB — CK TOTAL AND CKMB (NOT AT ARMC): Total CK: 153

## 2013-05-19 ENCOUNTER — Emergency Department (HOSPITAL_COMMUNITY)
Admission: EM | Admit: 2013-05-19 | Discharge: 2013-05-19 | Disposition: A | Discharge status: Home / Self Care | Payer: Medicare Other | Attending: Emergency Medicine | Admitting: Emergency Medicine

## 2013-05-19 ENCOUNTER — Emergency Department (HOSPITAL_COMMUNITY): Payer: Medicare Other

## 2013-05-19 ENCOUNTER — Encounter (HOSPITAL_COMMUNITY): Payer: Self-pay | Admitting: Emergency Medicine

## 2013-05-19 DIAGNOSIS — Z87820 Personal history of traumatic brain injury: Secondary | ICD-10-CM | POA: Insufficient documentation

## 2013-05-19 DIAGNOSIS — R0789 Other chest pain: Secondary | ICD-10-CM

## 2013-05-19 DIAGNOSIS — G8190 Hemiplegia, unspecified affecting unspecified side: Secondary | ICD-10-CM | POA: Insufficient documentation

## 2013-05-19 DIAGNOSIS — E119 Type 2 diabetes mellitus without complications: Secondary | ICD-10-CM | POA: Insufficient documentation

## 2013-05-19 DIAGNOSIS — X58XXXA Exposure to other specified factors, initial encounter: Secondary | ICD-10-CM | POA: Insufficient documentation

## 2013-05-19 DIAGNOSIS — S40011A Contusion of right shoulder, initial encounter: Secondary | ICD-10-CM

## 2013-05-19 DIAGNOSIS — Z8669 Personal history of other diseases of the nervous system and sense organs: Secondary | ICD-10-CM | POA: Insufficient documentation

## 2013-05-19 DIAGNOSIS — S40019A Contusion of unspecified shoulder, initial encounter: Secondary | ICD-10-CM | POA: Insufficient documentation

## 2013-05-19 DIAGNOSIS — R079 Chest pain, unspecified: Secondary | ICD-10-CM | POA: Insufficient documentation

## 2013-05-19 DIAGNOSIS — G8911 Acute pain due to trauma: Secondary | ICD-10-CM | POA: Insufficient documentation

## 2013-05-19 DIAGNOSIS — S298XXA Other specified injuries of thorax, initial encounter: Secondary | ICD-10-CM | POA: Insufficient documentation

## 2013-05-19 DIAGNOSIS — S0003XA Contusion of scalp, initial encounter: Secondary | ICD-10-CM | POA: Insufficient documentation

## 2013-05-19 DIAGNOSIS — Y929 Unspecified place or not applicable: Secondary | ICD-10-CM | POA: Insufficient documentation

## 2013-05-19 DIAGNOSIS — F17200 Nicotine dependence, unspecified, uncomplicated: Secondary | ICD-10-CM | POA: Insufficient documentation

## 2013-05-19 DIAGNOSIS — Y939 Activity, unspecified: Secondary | ICD-10-CM | POA: Insufficient documentation

## 2013-05-19 HISTORY — DX: Type 2 diabetes mellitus without complications: E11.9

## 2013-05-19 HISTORY — DX: Unspecified intracranial injury with loss of consciousness status unknown, initial encounter: S06.9XAA

## 2013-05-19 HISTORY — DX: Unspecified convulsions: R56.9

## 2013-05-19 HISTORY — DX: Unspecified intracranial injury with loss of consciousness of unspecified duration, initial encounter: S06.9X9A

## 2013-05-19 LAB — CBC WITH DIFFERENTIAL/PLATELET
Basophils Absolute: 0 10*3/uL (ref 0.0–0.1)
HCT: 45.4 % (ref 39.0–52.0)
Lymphs Abs: 2.2 10*3/uL (ref 0.7–4.0)
MCH: 29.3 pg (ref 26.0–34.0)
MCV: 88.2 fL (ref 78.0–100.0)
Monocytes Absolute: 0.7 10*3/uL (ref 0.1–1.0)
Monocytes Relative: 8 % (ref 3–12)
Neutro Abs: 6 10*3/uL (ref 1.7–7.7)
Platelets: ADEQUATE 10*3/uL (ref 150–400)
RDW: 13.3 % (ref 11.5–15.5)
WBC: 9.2 10*3/uL (ref 4.0–10.5)

## 2013-05-19 LAB — URINALYSIS, ROUTINE W REFLEX MICROSCOPIC
Hgb urine dipstick: NEGATIVE
Specific Gravity, Urine: 1.038 — ABNORMAL HIGH (ref 1.005–1.030)
pH: 6 (ref 5.0–8.0)

## 2013-05-19 LAB — POCT I-STAT, CHEM 8
BUN: 27 mg/dL — ABNORMAL HIGH (ref 6–23)
Calcium, Ion: 1.26 mmol/L (ref 1.13–1.30)
Chloride: 109 meq/L (ref 96–112)
Creatinine, Ser: 1 mg/dL (ref 0.50–1.35)
Glucose, Bld: 89 mg/dL (ref 70–99)
HCT: 49 % (ref 39.0–52.0)
Hemoglobin: 16.7 g/dL (ref 13.0–17.0)
Potassium: 6.4 meq/L (ref 3.5–5.1)
Sodium: 143 meq/L (ref 135–145)
TCO2: 29 mmol/L (ref 0–100)

## 2013-05-19 LAB — URINE MICROSCOPIC-ADD ON

## 2013-05-19 LAB — GLUCOSE, CAPILLARY: Glucose-Capillary: 95 mg/dL (ref 70–99)

## 2013-05-19 LAB — POCT I-STAT TROPONIN I

## 2013-05-19 MED ORDER — DEXTROSE 50 % IV SOLN
1.0000 | Freq: Once | INTRAVENOUS | Status: DC
  Filled 2013-05-19: qty 50

## 2013-05-19 MED ORDER — INSULIN ASPART 100 UNIT/ML IV SOLN
10.0000 [IU] | Freq: Once | INTRAVENOUS | Status: DC
  Filled 2013-05-19: qty 0.1

## 2013-05-19 MED ORDER — SODIUM BICARBONATE 8.4 % IV SOLN
50.0000 meq | Freq: Once | INTRAVENOUS | Status: DC
  Filled 2013-05-19: qty 50

## 2013-05-19 MED ORDER — SODIUM POLYSTYRENE SULFONATE 15 GM/60ML PO SUSP
30.0000 g | Freq: Once | ORAL | Status: AC
  Administered 2013-05-19: 30 g via ORAL
  Filled 2013-05-19: qty 120

## 2013-05-19 NOTE — ED Notes (Signed)
IV Team paged 
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
## 2013-05-19 NOTE — ED Notes (Signed)
Results of chem 8 given to Dr. Manus Gunning

## 2013-05-19 NOTE — ED Notes (Signed)
Spoke to IV team, communicated the need for IV due to pt high Potassium level.

## 2013-05-19 NOTE — ED Notes (Signed)
Seizure pads placed on pt bed as a precaution.
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
## 2013-05-20 NOTE — ED Provider Notes (Signed)
Followup call with patient:  He states that he feels "fine and too blessed to be stressed".  Denies chest pain or shortness of breath. I reminded patient of need to followup with cardiology regarding EKG changes. I also advised him of the remote possibility that T wave inversions could be due to pulmonary embolism.  I requested that he return to the hospital for a D-dimer blood test, but [facility] because he says [facility] fine. I explained to the patient that if he does in fact have a PE, this could be life threatening. He expressed understanding and still prefers to follow up with the [place]. I do suspect his T wave inversions are secondary to LVH. PE seems less likely as patient is essentially asymptomatic and has no chest pain, shortness of breath, or tachycardia or hypoxia at his visit yesterday. I encouraged patient to return to the ED if he develops chest pain, SOB, or any other concerning symptoms.  Glynn Octave, MD 05/20/13 1229

## 2013-07-26 ENCOUNTER — Encounter (HOSPITAL_COMMUNITY): Payer: Self-pay | Admitting: Internal Medicine

## 2013-07-26 ENCOUNTER — Inpatient Hospital Stay (HOSPITAL_COMMUNITY)
Admission: EM | Admit: 2013-07-26 | Discharge: 2013-07-31 | DRG: 100 | Disposition: A | Discharge status: Home Health | Payer: Medicare Other | Attending: Internal Medicine | Admitting: Internal Medicine

## 2013-07-26 ENCOUNTER — Emergency Department (HOSPITAL_COMMUNITY): Payer: Medicare Other

## 2013-07-26 DIAGNOSIS — Z8673 Personal history of transient ischemic attack (TIA), and cerebral infarction without residual deficits: Secondary | ICD-10-CM

## 2013-07-26 DIAGNOSIS — E876 Hypokalemia: Secondary | ICD-10-CM | POA: Diagnosis present

## 2013-07-26 DIAGNOSIS — G40909 Epilepsy, unspecified, not intractable, without status epilepticus: Secondary | ICD-10-CM

## 2013-07-26 DIAGNOSIS — R4182 Altered mental status, unspecified: Secondary | ICD-10-CM

## 2013-07-26 DIAGNOSIS — L8990 Pressure ulcer of unspecified site, unspecified stage: Secondary | ICD-10-CM | POA: Diagnosis present

## 2013-07-26 DIAGNOSIS — J690 Pneumonitis due to inhalation of food and vomit: Secondary | ICD-10-CM

## 2013-07-26 DIAGNOSIS — L89899 Pressure ulcer of other site, unspecified stage: Secondary | ICD-10-CM | POA: Diagnosis present

## 2013-07-26 DIAGNOSIS — Z87820 Personal history of traumatic brain injury: Secondary | ICD-10-CM

## 2013-07-26 DIAGNOSIS — F17200 Nicotine dependence, unspecified, uncomplicated: Secondary | ICD-10-CM | POA: Diagnosis present

## 2013-07-26 DIAGNOSIS — R32 Unspecified urinary incontinence: Secondary | ICD-10-CM | POA: Diagnosis present

## 2013-07-26 DIAGNOSIS — E119 Type 2 diabetes mellitus without complications: Secondary | ICD-10-CM | POA: Diagnosis present

## 2013-07-26 DIAGNOSIS — R159 Full incontinence of feces: Secondary | ICD-10-CM | POA: Diagnosis present

## 2013-07-26 DIAGNOSIS — M6282 Rhabdomyolysis: Secondary | ICD-10-CM | POA: Diagnosis present

## 2013-07-26 DIAGNOSIS — Z993 Dependence on wheelchair: Secondary | ICD-10-CM

## 2013-07-26 DIAGNOSIS — E785 Hyperlipidemia, unspecified: Secondary | ICD-10-CM | POA: Diagnosis present

## 2013-07-26 DIAGNOSIS — I214 Non-ST elevation (NSTEMI) myocardial infarction: Secondary | ICD-10-CM

## 2013-07-26 DIAGNOSIS — I10 Essential (primary) hypertension: Secondary | ICD-10-CM | POA: Diagnosis present

## 2013-07-26 DIAGNOSIS — G9340 Encephalopathy, unspecified: Secondary | ICD-10-CM | POA: Diagnosis present

## 2013-07-26 DIAGNOSIS — E860 Dehydration: Secondary | ICD-10-CM | POA: Diagnosis present

## 2013-07-26 HISTORY — DX: Other ill-defined heart diseases: I51.89

## 2013-07-26 LAB — CBC WITH DIFFERENTIAL/PLATELET
Basophils Absolute: 0 10*3/uL (ref 0.0–0.1)
Basophils Relative: 0 % (ref 0–1)
EOS PCT: 0 % (ref 0–5)
Eosinophils Absolute: 0 10*3/uL (ref 0.0–0.7)
HCT: 34.9 % — ABNORMAL LOW (ref 39.0–52.0)
Hemoglobin: 11.9 g/dL — ABNORMAL LOW (ref 13.0–17.0)
LYMPHS PCT: 5 % — AB (ref 12–46)
Lymphs Abs: 1.1 10*3/uL (ref 0.7–4.0)
MCH: 29.5 pg (ref 26.0–34.0)
MCHC: 34.1 g/dL (ref 30.0–36.0)
MCV: 86.6 fL (ref 78.0–100.0)
Monocytes Absolute: 0.9 10*3/uL (ref 0.1–1.0)
Monocytes Relative: 4 % (ref 3–12)
NEUTROS PCT: 91 % — AB (ref 43–77)
Neutro Abs: 19.3 10*3/uL — ABNORMAL HIGH (ref 1.7–7.7)
PLATELETS: 196 10*3/uL (ref 150–400)
RBC: 4.03 MIL/uL — AB (ref 4.22–5.81)
RDW: 13.3 % (ref 11.5–15.5)
WBC: 21.3 10*3/uL — ABNORMAL HIGH (ref 4.0–10.5)

## 2013-07-26 LAB — COMPREHENSIVE METABOLIC PANEL
ALT: 52 U/L (ref 0–53)
AST: 95 U/L — AB (ref 0–37)
Albumin: 3.2 g/dL — ABNORMAL LOW (ref 3.5–5.2)
Alkaline Phosphatase: 61 U/L (ref 39–117)
BILIRUBIN TOTAL: 0.6 mg/dL (ref 0.3–1.2)
BUN: 7 mg/dL (ref 6–23)
CHLORIDE: 105 meq/L (ref 96–112)
CO2: 25 mEq/L (ref 19–32)
Calcium: 9.1 mg/dL (ref 8.4–10.5)
Creatinine, Ser: 0.65 mg/dL (ref 0.50–1.35)
GLUCOSE: 122 mg/dL — AB (ref 70–99)
Potassium: 3.7 mEq/L (ref 3.7–5.3)
Sodium: 142 mEq/L (ref 137–147)
Total Protein: 6.8 g/dL (ref 6.0–8.3)

## 2013-07-26 LAB — RAPID URINE DRUG SCREEN, HOSP PERFORMED
Amphetamines: NOT DETECTED
Barbiturates: NOT DETECTED
Benzodiazepines: NOT DETECTED
Cocaine: NOT DETECTED
Opiates: NOT DETECTED
Tetrahydrocannabinol: NOT DETECTED

## 2013-07-26 LAB — URINALYSIS, ROUTINE W REFLEX MICROSCOPIC
BILIRUBIN URINE: NEGATIVE
Glucose, UA: NEGATIVE mg/dL
Ketones, ur: NEGATIVE mg/dL
Leukocytes, UA: NEGATIVE
Nitrite: NEGATIVE
PROTEIN: NEGATIVE mg/dL
Specific Gravity, Urine: 1.009 (ref 1.005–1.030)
UROBILINOGEN UA: 1 mg/dL (ref 0.0–1.0)
pH: 7.5 (ref 5.0–8.0)

## 2013-07-26 LAB — PROTIME-INR
INR: 1.06 (ref 0.00–1.49)
Prothrombin Time: 13.6 s (ref 11.6–15.2)

## 2013-07-26 LAB — TROPONIN I: TROPONIN I: 1.12 ng/mL — AB (ref <0.30)

## 2013-07-26 LAB — URINE MICROSCOPIC-ADD ON

## 2013-07-26 LAB — CARBAMAZEPINE LEVEL, TOTAL: Carbamazepine Lvl: 3.3 ug/mL — ABNORMAL LOW (ref 4.0–12.0)

## 2013-07-26 LAB — CK: Total CK: 2989 U/L — ABNORMAL HIGH (ref 7–232)

## 2013-07-26 LAB — PHENYTOIN LEVEL, TOTAL: Phenytoin Lvl: <2.5 ug/mL — ABNORMAL LOW (ref 10.0–20.0)

## 2013-07-26 MED ORDER — ASPIRIN 81 MG PO CHEW: 324.0000 mg | CHEWABLE_TABLET | Freq: Once | ORAL | Status: DC

## 2013-07-26 MED ORDER — SODIUM CHLORIDE 0.9 % IV SOLN
750.0000 mg | Freq: Once | INTRAVENOUS | Status: AC
  Administered 2013-07-27: 750 mg via INTRAVENOUS
  Filled 2013-07-26: qty 15

## 2013-07-26 MED ORDER — SODIUM CHLORIDE 0.9 % IV SOLN
1000.0000 mg | Freq: Two times a day (BID) | INTRAVENOUS | Status: DC
  Administered 2013-07-27 – 2013-07-28 (×4): 1000 mg via INTRAVENOUS
  Filled 2013-07-26 (×4): qty 10

## 2013-07-26 MED ORDER — SODIUM CHLORIDE 0.9 % IV BOLUS (SEPSIS)
1000.0000 mL | Freq: Once | INTRAVENOUS | Status: AC
  Administered 2013-07-26: 1000 mL via INTRAVENOUS

## 2013-07-26 MED ORDER — PHENYTOIN SODIUM 50 MG/ML IJ SOLN
100.0000 mg | Freq: Two times a day (BID) | INTRAMUSCULAR | Status: DC
  Administered 2013-07-27 – 2013-07-28 (×4): 100 mg via INTRAVENOUS
  Filled 2013-07-26 (×5): qty 2

## 2013-07-26 MED ORDER — SODIUM CHLORIDE 0.9 % IV SOLN
INTRAVENOUS | Status: DC
  Administered 2013-07-26: 21:00:00 via INTRAVENOUS

## 2013-07-26 MED ORDER — LORAZEPAM 2 MG/ML IJ SOLN
1.0000 mg | Freq: Once | INTRAMUSCULAR | Status: AC
  Administered 2013-07-26: 1 mg via INTRAVENOUS

## 2013-07-26 MED ORDER — LORAZEPAM 2 MG/ML IJ SOLN: 1.0000 mg | Freq: Once | INTRAMUSCULAR | Status: DC

## 2013-07-26 MED ORDER — LORAZEPAM 2 MG/ML IJ SOLN
1.0000 mg | Freq: Once | INTRAMUSCULAR | Status: AC
  Administered 2013-07-26: 1 mg via INTRAMUSCULAR
  Filled 2013-07-26: qty 1

## 2013-07-26 MED ORDER — LORAZEPAM 2 MG/ML IJ SOLN
INTRAMUSCULAR | Status: AC
  Filled 2013-07-26: qty 1

## 2013-07-26 MED ORDER — LORAZEPAM 2 MG/ML IJ SOLN
1.0000 mg | Freq: Once | INTRAMUSCULAR | Status: AC
  Administered 2013-07-26: 1 mg via INTRAVENOUS
  Filled 2013-07-26: qty 1

## 2013-07-26 MED ORDER — CARBAMAZEPINE ER 200 MG PO TB12
300.0000 mg | ORAL_TABLET | Freq: Every day | ORAL | Status: DC
  Filled 2013-07-26 (×2): qty 1

## 2013-07-26 MED ORDER — HEPARIN SODIUM (PORCINE) 5000 UNIT/ML IJ SOLN: 4000.0000 [IU] | INTRAMUSCULAR | Status: DC

## 2013-07-26 MED ORDER — CLINDAMYCIN PHOSPHATE 900 MG/50ML IV SOLN
900.0000 mg | Freq: Once | INTRAVENOUS | Status: AC
  Administered 2013-07-26: 900 mg via INTRAVENOUS
  Filled 2013-07-26: qty 50

## 2013-07-26 NOTE — ED Notes (Signed)
LP Puncture performed by MD Leonel Ramsay. Pt tolerated well.

## 2013-07-26 NOTE — Procedures (Signed)
Indication: Altered Mental Status  Consent could not be obtained after multiple attempts to contact responsible parties was unsuccessful. Due to emergent nature of the procedure, it was performed on an emergent basis.    The patient was prepped and draped, and using sterile technique a 20 gauge quinke spinal needle was inserted in the L4-5 space. OP was not obtained, though did not appear to be elevated. Approximately 4 cc of CSF were obtained and sent for analysis.

## 2013-07-26 NOTE — Consult Note (Addendum)
Neurology Consultation Reason for Consult: Altered Mental Status  Referring Physician: Marin Comment, P  CC: ams  History is obtained from: chart, limited from patient.   HPI: Wyatt Salas is a 63 y.o. male who has a history of seizures, last seen well yesterday. He was found confused in his room today with it apparently "trashed." There was also some question of people coming and going from his apartment last night, though this is the third hand information.  He has been agitated and confused in teh ED and was found to have a leukocytosis, though no fever(99.5). He does complain of headache.   He has a history of a subdural hematoma which is felt to be the cause of his seizures. He has left-sided deficits secondary to the subdural hematoma.   LKW: yesterday tpa given?: no, out of window.     ROS: Unable to assess secondary to patient's altered mental status.    Past Medical History  Diagnosis Date  . Traumatic brain injury   . Diabetes mellitus without complication   . Seizures     Family History: Unable to assess secondary to patient's altered mental status.    Social History: Tob: Unable to assess secondary to patient's altered mental status.    Exam: Current vital signs: BP 116/78  Pulse 103  Temp(Src) 99.5 F (37.5 C) (Rectal)  Resp 11  Ht 6' 0.83" (1.85 m)  Wt 105.2 kg (231 lb 14.8 oz)  BMI 30.74 kg/m2  SpO2 98% Vital signs in last 24 hours: Temp:  [99.5 F (37.5 C)] 99.5 F (37.5 C) (01/07 1731) Pulse Rate:  [99-115] 103 (01/07 2315) Resp:  [11-25] 11 (01/07 2315) BP: (94-159)/(59-129) 116/78 mmHg (01/07 2315) SpO2:  [94 %-100 %] 98 % (01/07 2315) Weight:  [105.2 kg (231 lb 14.8 oz)] 105.2 kg (231 lb 14.8 oz) (01/07 2000)  General: in bed, appears mildly agitated.  CV: tachycardic Mental Status: Patient is awake, alert, not oriented to year or place.  Able to answer simple questions and follow simple commands. No signs of aphasia or neglect Cranial  Nerves: II: blinks to threat bilaterally Pupils are equal, round, and reactive to light.  Discs are difficult to visualize. III,IV, VI: EOMI without ptosis or diploplia.  V: Facial sensation is symmetric to temperature VII: Facial movement is mildly diminished on left.  VIII: hearing is intact to voice X: Uvula elevates symmetrically XI: Shoulder shrug is symmetric. XII: tongue is midline without atrophy or fasciculations.  Motor: Tone is normal. Bulk is normal. 5/5 strength was present on the right, does not cooperate, but appears to have mild weakness on left. Follows commands to give thumbs up bilaterally.  Sensory: respnods to stimuli bilaterally Deep Tendon Reflexes: 2+ and symmetric in the biceps and patellae.  Cerebellar: Unable to assess secondary to patient's altered mental status.   Gait: Unable to assess secondary to patient's altered mental status.          I have reviewed labs in epic and the results pertinent to this consultation are: Leukocytosis.   I have reviewed the images obtained:CT head - old right sided insult  Impression: 63 yo M with h/o right SDH and subsequent seizure disorder with leukocytosis and confusion. Though only low grade temp elevation, would LP given headache, leukocytosis and confusion. No signs of ongoing seizure activity, though this could represent a post-ictal state. Unclear if he missed med doses  Recommendations: 1) LP with cell count, protein, glucose, culture, HSV by PCR. If pleocytosis,  will start empiric coverage.  2) Continue home dilantin, keppra(change doses to IV). Will give dilantin load given low level.  3) if able to take PO, give tegretol at home dose 4) EEG, routine in the AM 5) If continues to be confused in the am, MRI brain 6) if possible, would favor holding off on anticoagulation for 24 hours post-LP as this would increase risk of epidural hematoma.   Roland Rack, MD [facility] [phone]  If 7pm- 7am, please page neurology on call at [phone].

## 2013-07-26 NOTE — ED Notes (Signed)
PT last seen normal on Tuesday  At 1500 by Friend. Pt's HHN came at 0900 today and Pt did not answer door. Then at 1600 Pt's Friend returned to home and Pt did not answer the door again. The Friend reoported finding PT on the floor. Pt was confused and alert to name only. Pt b/p 177/99 , HR 119, CBG 144. All info provided by [facility].

## 2013-07-26 NOTE — ED Notes (Signed)
Md and PA made aware that pt remains agitated and two nurses and IV team were unable to draw blood. Per MD, will continue to hydrate pt and attempt for blood draw later. WIll hold CT for now as well dt pt unable to hold still
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
## 2013-07-26 NOTE — ED Notes (Signed)
CRITICAL VALUE ALERT  Critical value received:  Troponin 1.12  Date of notification:  07/26/2013  Time of notification: 2036  Critical value read back:Yes   Nurse who received alert: Elyn Peers   MD notified (1st page): MD Rogene Houston

## 2013-07-26 NOTE — ED Notes (Signed)
Pt transported to CT with Primary RN and tech.

## 2013-07-26 NOTE — H&P (Signed)
[facility] History and Physical  JAVONTAE MARLETTE WCH:[id number] DOB: 08/23/1951    PCP:   No primary provider on file.   Chief Complaint: unable, admitted for altered mental status.  Information is gathered by chart, EDP, and staff.  HPI: Wyatt Salas is an 63 y.o. male with hx PMHx of occassional marijuna use, DM-Type 2, CVA, SDH, Gonorrhea, HCV, seizure DO, HTN, HLD and GERD, was in his usual state of health, reportedly ambulatory with walker and able to do ADL and conversing, found at home dis-shoveled and confused on the floor, having bowel and bladder incontinence.  In the ER, he was not responding at the beginning, confused, and later was able to answer some direct questions.  He was agitated and was given Ativan.   Work up included a head CT which showed no acute process, an EKG showed SR, with LVH, and troponin of 1.12.  His Tegretal and Dilantin level were both subtherapeutic.  He has a marked leukocytosis with WBC of 21K, with elevated CK to 3000, and Cr of 0.65.  Cardiology was consulted, did not feel his elevation of troponin was from an ACS, and recommended IV heparin when safe to do so, along with ASA, and to further obtain troponin and ECHO.  I consulted Dr Leonel Ramsay as well, and he performed an LP along with supplementing his ACD as the levels were low.  His CXR was suspicious for aspiration PNA, so Clindamycin was given.  Hospitalist was asked to admit him for further evaluation and Tx of altered mental status, supicious for seizure, with possible NSTEMI, and aspiration PNA.     Rewiew of Systems: Unable to obtain meaningful ROS.     Past Medical History  Diagnosis Date  . Traumatic brain injury   . Diabetes mellitus without complication   . Seizures     History reviewed. No pertinent past surgical history.  Medications:  HOME MEDS: Prior to Admission medications   Medication Sig Start Date End Date Taking? Authorizing Provider  acetaminophen (TYLENOL)  500 MG tablet Take 500 mg by mouth 2 (two) times daily.     Historical Provider, MD  carbamazepine (TEGRETOL) 200 MG tablet Take 300 mg by mouth at bedtime. Takes 1.5 tabs at bedtime    Historical Provider, MD  docusate sodium (COLACE) 100 MG capsule Take 200 mg by mouth 2 (two) times daily.    Historical Provider, MD  galantamine (RAZADYNE ER) 16 MG 24 hr capsule Take 16 mg by mouth daily with breakfast.    Historical Provider, MD  levETIRAcetam (KEPPRA) 500 MG tablet Take 1,000 mg by mouth every 12 (twelve) hours.    Historical Provider, MD  metFORMIN (GLUCOPHAGE) 500 MG tablet Take 500 mg by mouth daily with breakfast.    Historical Provider, MD  Multiple Vitamin (MULTIVITAMIN WITH MINERALS) TABS tablet Take 1 tablet by mouth daily.    Historical Provider, MD  naproxen (NAPROSYN) 500 MG tablet Take 500 mg by mouth 2 (two) times daily with a meal.     Historical Provider, MD  phenytoin (DILANTIN) 100 MG ER capsule Take 100 mg by mouth 2 (two) times daily.    Historical Provider, MD  simvastatin (ZOCOR) 80 MG tablet Take 40 mg by mouth at bedtime.    Historical Provider, MD     Allergies:  No Known Allergies  Social History:   reports that he has been smoking Cigarettes.  He has been smoking about 0.00 packs per day. He does not have any  smokeless tobacco history on file. He reports that he drinks alcohol. He reports that he does not use illicit drugs.  Family History: No family history on file.   Physical Exam: Filed Vitals:   07/26/13 1914 07/26/13 1936 07/26/13 2000 07/26/13 2045  BP: 145/62 94/59 132/68 130/77  Pulse: 111 104 107 112  Temp:      TempSrc:      Resp:   15 25  Height:   6' 0.83" (1.85 m)   Weight:   105.2 kg (231 lb 14.8 oz)   SpO2: 100% 97% 94% 100%   Blood pressure 130/77, pulse 112, temperature 99.5 F (37.5 C), temperature source Rectal, resp. rate 25, height 6' 0.84" (1.85 m), weight 105.2 kg (231 lb 14.8 oz), SpO2 100.00%.  GEN:  Very somnolent patient  lying in the stretcher in no acute distress; cooperative with exam if given simple verbal command. PSYCH: does not appear anxious or depressed; affect is appropriate. HEENT: Mucous membranes pink and anicteric; PERRLA; no cervical lymphadenopathy nor thyromegaly or carotid bruit; no JVD; There were no stridor. Neck is very supple. Breasts:: Not examined CHEST WALL: No tenderness CHEST: Normal respiration, clear to auscultation bilaterally.  HEART: Regular rate and rhythm.  There are no murmur, rub, or gallops.   BACK: No kyphosis or scoliosis; no CVA tenderness ABDOMEN: soft and non-tender; no masses, no organomegaly, normal abdominal bowel sounds; no pannus; no intertriginous candida. There is no rebound and no distention. Rectal Exam: Not done EXTREMITIES: No bone or joint deformity; age-appropriate arthropathy of the hands and knees; no edema; no ulcerations.  There is no calf tenderness. Genitalia: not examined PULSES: 2+ and symmetric SKIN: Normal hydration no rash or ulceration CNS: moves all four extremities, but I am unable to complete full neurological exam.  He did answer that he was at the hospital, but don't know where or why he is here.   Labs on Admission:  Basic Metabolic Panel:  Recent Labs Lab 07/26/13 1716  NA 142  K 3.7  CL 105  CO2 25  GLUCOSE 122*  BUN 7  CREATININE 0.65  CALCIUM 9.1   Liver Function Tests:  Recent Labs Lab 07/26/13 1716  AST 95*  ALT 52  ALKPHOS 61  BILITOT 0.6  PROT 6.8  ALBUMIN 3.2*   No results found for this basename: LIPASE, AMYLASE,  in the last 168 hours No results found for this basename: AMMONIA,  in the last 168 hours CBC:  Recent Labs Lab 07/26/13 1716 07/26/13 2019  WBC PENDING 21.3*  NEUTROABS  --  19.3*  HGB 12.8* 11.9*  HCT 37.9* 34.9*  MCV 88.3 86.6  PLT 176 196   Cardiac Enzymes:  Recent Labs Lab 07/26/13 1716 07/26/13 1719  CKTOTAL 2989*  --   TROPONINI  --  1.12*    CBG: No results found for  this basename: GLUCAP,  in the last 168 hours   Radiological Exams on Admission: Ct Cervical Spine Wo Contrast  07/26/2013   CLINICAL DATA:  Altered mental status and neck pain  EXAM: CT HEAD WITHOUT CONTRAST  CT CERVICAL SPINE WITHOUT CONTRAST  TECHNIQUE: Multidetector CT imaging of the head and cervical spine was performed following the standard protocol without intravenous contrast. Multiplanar CT image reconstructions of the cervical spine were also generated.  COMPARISON:  CT HEAD W/O CM dated 05/19/2013; CT HEAD W/O CM dated 05/31/2009; CT cervical spine May 31, 2009  FINDINGS: CT HEAD FINDINGS  There is mild diffuse atrophy. There  is a degree of ex vacuo phenomenon in the right lateral ventricle due to encephalomalacia, stable.  There is evidence of a prior right temporal lobe infarct, primarily involving the posterior aspect of the right temporal lobe, a stable finding. There is no acute appearing infarct present. There is small vessel disease in the centra semiovale bilaterally, more on the right anteriorly than elsewhere. There is no intracranial mass, hemorrhage, extra-axial fluid collection, or midline shift. Gray-white compartments are normal. The bony calvarium appears intact. There is right maxillary and bilateral ethmoid sinus disease.  CT CERVICAL SPINE FINDINGS  Note that there is moderate motion artifact. There is no apparent fracture. There is slight anterolisthesis of C2 on C3, stable. There is 4 mm of anterolisthesis of C3 on C4 which is new but is felt to be due to underlying spondylosis. No other spondylolisthesis is appreciable. Prevertebral soft tissues and predental space regions are normal.  There is moderate disc space narrowing at C3-4 and C4-5. There is moderately severe disc space narrowing at C5-6 and C6-7. There is facet osteoarthritic change at multiple levels. Exit foraminal narrowing appears most marked at C3-4 on the left, C4-5 bilaterally, C5-6 on the left, and at C6-7  bilaterally.  There is extensive pneumonitis in the visualized right upper lobe.  IMPRESSION: CT head: Prior right temporal lobe infarct. Atrophy with small vessel disease. There is some encephalomalacia on the right with enlargement of the right lateral ventricle compared to the left, stable. No acute appearing infarct is seen. No hemorrhage or mass effect. There is paranasal sinus disease.  CT cervical spine: Extensive osteoarthritic change. No fracture. New mild anterolisthesis of C3 on C4, most likely due to underlying spondylosis. If there is concern for ligamentous injury in this area, it may be reasonable to correlate with lateral flexion and extension views to further evaluate.  There is extensive right upper lobe airspace consolidation. Given the clinical history, this finding must be viewed with concern for potential aspiration.   Electronically Signed   By: Lowella Grip M.D.   On: 07/26/2013 19:16   Dg Chest Portable 1 View  07/26/2013   CLINICAL DATA:  Altered mental status  EXAM: PORTABLE CHEST - 1 VIEW  COMPARISON:  05/19/2013  FINDINGS: Extensive airspace disease within the right upper lobe. Hazy airspace disease in the right lower lung zone. Left lung is clear. Normal heart size. No pneumothorax. Chronic right rib deformities.  IMPRESSION: Right lung airspace disease.   Electronically Signed   By: Maryclare Bean M.D.   On: 07/26/2013 18:00    EKG: Independently reviewed. No acute ST elevation. NSR.   Assessment/Plan Present on Admission:  . Altered mental status . Seizure disorder . NSTEMI (non-ST elevated myocardial infarction) . DM2 (diabetes mellitus, type 2) . Aspiration pneumonia . Rhabdomyolysis  PLAN:  Will admit him for altered mental status, most likely post ictal from having had a seizure.  Will check LP to be sure there is no CNS infection.  Neurology has helped with dosing of the anti-convulsive drugs and will follow closely as well.  I will admit him to SDU for closer  monitoring.  Guideline indicates NOT to start IV heparin after LP for at least 24 hours.  As far as his elevated troponin is concerned, cardiology has seen in consultation and recommended ASA, cycling troponin, follow up with CPK and MB, and look at his ECHO.  For his possible leukocytosis and aspiration PNA, will continue with IV Clindamycin. He has some rhabdo, and  will be given some IVF, with repeating CPK in the am.   For his DM, will use SSI, and I have held his Metformin.  He is stable hemodynamically, full code, and will be admitted to [facility] service.    Other plans as per orders.  Code Status: FULL Haskel Khan, MD. [facility] Pager [phone] 7pm to 7am.  07/26/2013, 11:07 PM

## 2013-07-26 NOTE — ED Notes (Signed)
MD Truman Hayward at bedside. Pt now able to respond appropriately to some questions. Pt knows name but states he is 20 and doesn't know where he is.

## 2013-07-26 NOTE — ED Notes (Signed)
I/O Catheter assisted by NT A
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
## 2013-07-26 NOTE — ED Notes (Signed)
SAfety sitter now at bedside.

## 2013-07-26 NOTE — ED Provider Notes (Signed)
CSN: [id number]     Arrival date & time 07/26/13  1649 History   First MD Initiated Contact with Patient 07/26/13 1650     Chief Complaint  Patient presents with  . Altered Mental Status   (Consider location/radiation/quality/duration/timing/severity/associated sxs/prior Treatment) HPI Comments: Patient is a 63 yo M PMHx significant for TBI, DM, Seizures into the emergency department from home for altered mental status. The patient was last seen normal Tuesday at [facility] when the patient's home health nurse left the patient. Patient's friend attempted to stop by and see patient today patient did not answer the door despite repeated attempts by the friend. The friend was finally able to open the house door and was found on the ground. Blood was noted around the mouth. The patient had had urinary incontinence. The patient is unable to describe or elaborate any incidents that occurred. 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 She states she stopped by his house and it appeared that somebody had broken into it and the police have been called. Patient is a level V caveat.   Patient is a 63 y.o. male presenting with altered mental status. The history is provided by the EMS personnel and medical records.  Altered Mental Status   Past Medical History  Diagnosis Date  . Traumatic brain injury   . Diabetes mellitus without complication   . Seizures    History reviewed. No pertinent past surgical history. No family history on file. History  Substance Use Topics  . Smoking status: Current Every Day Smoker    Types: Cigarettes  . Smokeless tobacco: Not on file  . Alcohol Use: Yes     Comment: rare    Review of Systems  Unable to perform ROS: Mental status change    Allergies   Review of patient's allergies indicates no known allergies.  Home Medications   Current Outpatient Rx  Name  Route  Sig  Dispense  Refill  . acetaminophen (TYLENOL) 500 MG tablet   Oral   Take 500 mg by mouth 2 (two) times daily.          . carbamazepine (TEGRETOL) 200 MG tablet   Oral   Take 300 mg by mouth at bedtime. Takes 1.5 tabs at bedtime         . docusate sodium (COLACE) 100 MG capsule   Oral   Take 200 mg by mouth 2 (two) times daily.         Marland Kitchen galantamine (RAZADYNE ER) 16 MG 24 hr capsule   Oral   Take 16 mg by mouth daily with breakfast.         . levETIRAcetam (KEPPRA) 500 MG tablet   Oral   Take 1,000 mg by mouth every 12 (twelve) hours.         . metFORMIN (GLUCOPHAGE) 500 MG tablet   Oral   Take 500 mg by mouth daily with breakfast.         . Multiple Vitamin (MULTIVITAMIN WITH MINERALS) TABS tablet   Oral   Take 1 tablet by mouth daily.         . naproxen (NAPROSYN) 500 MG tablet   Oral   Take 500 mg by mouth 2 (two) times daily with a meal.          .  phenytoin (DILANTIN) 100 MG ER capsule   Oral   Take 100 mg by mouth 2 (two) times daily.         . simvastatin (ZOCOR) 80 MG tablet   Oral   Take 40 mg by mouth at bedtime.          BP 116/78  Pulse 103  Temp(Src) 99.5 F (37.5 C) (Rectal)  Resp 11  Ht 6' 0.83" (1.85 m)  Wt 231 lb 14.8 oz (105.2 kg)  BMI 30.74 kg/m2  SpO2 98% Physical Exam  Constitutional: He appears well-developed. He is uncooperative.  HENT:  Head: Normocephalic. Head is without raccoon's eyes and without Battle's sign.  Right Ear: External ear normal.  Left Ear: External ear normal.  Nose: Nose normal.  Mouth/Throat: Mucous membranes are dry.  Patient with well healed craniotomy scar on posterior scalp.   Eyes: Conjunctivae are normal.  Neck: Neck supple.  Cardiovascular: Regular rhythm, normal heart sounds and intact distal pulses.  Tachycardia present.   Pulmonary/Chest: Effort normal.   Abdominal: Soft. There is no tenderness.  Musculoskeletal: He exhibits no edema.  Neurological: He is alert. He is disoriented.  Patient unwilling/unable to follow commands appeared Moving all extremities.   Skin: Skin is warm and dry. He is not diaphoretic.  Psychiatric: He is agitated, actively hallucinating and combative.  Patient sitting up in bed talking to people who aren't in the room.     ED Course  Procedures (including critical care time) Medications  0.9 %  sodium chloride infusion ( Intravenous Stopped 07/26/13 2313)  levETIRAcetam (KEPPRA) 1,000 mg in sodium chloride 0.9 % 100 mL IVPB (not administered)  phenytoin (DILANTIN) injection 100 mg (not administered)  carbamazepine (TEGRETOL XR) 12 hr tablet 300 mg (not administered)  phenytoin (DILANTIN) 750 mg in sodium chloride 0.9 % 250 mL IVPB (not administered)  LORazepam (ATIVAN) injection 1 mg (1 mg Intramuscular Given 07/26/13 1732)  sodium chloride 0.9 % bolus 1,000 mL (0 mLs Intravenous Stopped 07/26/13 1938)  LORazepam (ATIVAN) injection 1 mg (1 mg Intravenous Given 07/26/13 1900)  sodium chloride 0.9 % bolus 1,000 mL (0 mLs Intravenous Stopped 07/26/13 2128)  clindamycin (CLEOCIN) IVPB 900 mg (0 mg Intravenous Stopped 07/26/13 2039)  LORazepam (ATIVAN) injection 1 mg (1 mg Intravenous Given 07/26/13 2237)  LORazepam (ATIVAN) injection 1 mg (1 mg Intravenous Given 07/26/13 2305)    Labs Review Labs Reviewed  CBC - Abnormal; Notable for the following:    Hemoglobin 12.8 (*)    HCT 37.9 (*)    All other components within normal limits  COMPREHENSIVE METABOLIC PANEL - Abnormal; Notable for the following:    Glucose, Bld 122 (*)    Albumin 3.2 (*)    AST 95 (*)    All other components within normal limits  URINALYSIS, ROUTINE W REFLEX MICROSCOPIC - Abnormal; Notable for the following:    Hgb urine dipstick TRACE (*)    All other components within normal limits  CK - Abnormal; Notable for the following:    Total CK 2989 (*)     All other components within normal limits  PHENYTOIN LEVEL, TOTAL - Abnormal; Notable for the following:    Phenytoin Lvl <2.5 (*)    All other components within normal limits  CARBAMAZEPINE LEVEL, TOTAL - Abnormal; Notable for the following:    Carbamazepine Lvl 3.3 (*)    All other components within normal limits  TROPONIN I - Abnormal; Notable for the following:    Troponin I 1.12 (*)  All other components within normal limits  CBC WITH DIFFERENTIAL - Abnormal; Notable for the following:    WBC 21.3 (*)    RBC 4.03 (*)    Hemoglobin 11.9 (*)    HCT 34.9 (*)    Neutrophils Relative % 91 (*)    Lymphocytes Relative 5 (*)    Neutro Abs 19.3 (*)    All other components within normal limits  PROTIME-INR  URINE RAPID DRUG SCREEN (HOSP PERFORMED)  URINE MICROSCOPIC-ADD ON  LEVETIRACETAM LEVEL  CSF CELL COUNT WITH DIFFERENTIAL  PROTEIN AND GLUCOSE, CSF  HERPES SIMPLEX VIRUS(HSV) DNA BY PCR   Imaging Review Ct Cervical Spine Wo Contrast  07/26/2013   CLINICAL DATA:  Altered mental status and neck pain  EXAM: CT HEAD WITHOUT CONTRAST  CT CERVICAL SPINE WITHOUT CONTRAST  TECHNIQUE: Multidetector CT imaging of the head and cervical spine was performed following the standard protocol without intravenous contrast. Multiplanar CT image reconstructions of the cervical spine were also generated.  COMPARISON:  CT HEAD W/O CM dated 05/19/2013; CT HEAD W/O CM dated 05/31/2009; CT cervical spine May 31, 2009  FINDINGS: CT HEAD FINDINGS  There is mild diffuse atrophy. There is a degree of ex vacuo phenomenon in the right lateral ventricle due to encephalomalacia, stable.  There is evidence of a prior right temporal lobe infarct, primarily involving the posterior aspect of the right temporal lobe, a stable finding. There is no acute appearing infarct present. There is small vessel disease in the centra semiovale bilaterally, more on the right anteriorly than elsewhere. There is no intracranial  mass, hemorrhage, extra-axial fluid collection, or midline shift. Gray-white compartments are normal. The bony calvarium appears intact. There is right maxillary and bilateral ethmoid sinus disease.  CT CERVICAL SPINE FINDINGS  Note that there is moderate motion artifact. There is no apparent fracture. There is slight anterolisthesis of C2 on C3, stable. There is 4 mm of anterolisthesis of C3 on C4 which is new but is felt to be due to underlying spondylosis. No other spondylolisthesis is appreciable. Prevertebral soft tissues and predental space regions are normal.  There is moderate disc space narrowing at C3-4 and C4-5. There is moderately severe disc space narrowing at C5-6 and C6-7. There is facet osteoarthritic change at multiple levels. Exit foraminal narrowing appears most marked at C3-4 on the left, C4-5 bilaterally, C5-6 on the left, and at C6-7 bilaterally.  There is extensive pneumonitis in the visualized right upper lobe.  IMPRESSION: CT head: Prior right temporal lobe infarct. Atrophy with small vessel disease. There is some encephalomalacia on the right with enlargement of the right lateral ventricle compared to the left, stable. No acute appearing infarct is seen. No hemorrhage or mass effect. There is paranasal sinus disease.  CT cervical spine: Extensive osteoarthritic change. No fracture. New mild anterolisthesis of C3 on C4, most likely due to underlying spondylosis. If there is concern for ligamentous injury in this area, it may be reasonable to correlate with lateral flexion and extension views to further evaluate.  There is extensive right upper lobe airspace consolidation. Given the clinical history, this finding must be viewed with concern for potential aspiration.   Electronically Signed   By: Lowella Grip M.D.   On: 07/26/2013 19:16   Dg Chest Portable 1 View  07/26/2013   CLINICAL DATA:  Altered mental status  EXAM: PORTABLE CHEST - 1 VIEW  COMPARISON:  05/19/2013  FINDINGS:  Extensive airspace disease within the right upper lobe. Hazy airspace disease  in the right lower lung zone. Left lung is clear. Normal heart size. No pneumothorax. Chronic right rib deformities.  IMPRESSION: Right lung airspace disease.   Electronically Signed   By: Maryclare Bean M.D.   On: 07/26/2013 18:00    EKG Interpretation   None      CRITICAL CARE Performed by: Baron Sane L   Total critical care time: 30 minutes  Critical care time was exclusive of separately billable procedures and treating other patients.  Critical care was necessary to treat or prevent imminent or life-threatening deterioration.  Critical care was time spent personally by me on the following activities: development of treatment plan with patient and/or surrogate as well as nursing, discussions with consultants, evaluation of patient's response to treatment, examination of patient, obtaining history from patient or surrogate, ordering and performing treatments and interventions, ordering and review of laboratory studies, ordering and review of radiographic studies, pulse oximetry and re-evaluation of patient's condition.  MDM   1. Altered mental status     Filed Vitals:   07/26/13 2315  BP: 116/78  Pulse: 103  Temp:   Resp: 11     8:59 PM Cardiology consulted on patient and will come and evaluate patient, feel he is more appropriate for medicine service.   Patient will be admitted to hospitalist service with cardiology consultation for AMS. Patient is alert, but disoriented and uncooperative. Patient appears dehydrated.IVF given. Patient with suspected aspiration pneumonia on CXR, will cover with IV Abx. Patient is sub-therapeutic on anti-convulsant medications. I have reviewed nursing notes, vital signs, and all appropriate lab and imaging results for this patient. Patient will be admitted for further evaluation and management of AMS. Patient d/w with Dr. Rogene Houston, agrees with plan.        [place], PA-C 07/27/13 0006

## 2013-07-26 NOTE — Consult Note (Signed)
Cardiology Consult Note   Patient ID: Wyatt Salas MRN: [id number], DOB/AGE: 64/10/1951   Admit date: 07/26/2013 Date of Consult: 07/26/2013  Primary Physician: No primary provider on file. Primary Cardiologist: Rolland Porter  Reason for consult:  Positive Troponin  HPI: Wyatt Salas is a 63 y.o. male AA Male who is currently with altered mental status and unable to provide history.  Historical records seem to suggest a PMHx of occassional marijuna use, DM-Type 2, CVA, SDH, Gonorrhea, HCV, seizure DO, HTN, HLD and GERD.  There is no family/friend at the bedside at the time of my visit but per report by ED physician and nursing staff, patient was apparently last seen normal yesterday (Tuesday @ 15:00 by a friend).  The patient's home health RN came at 09:00 today and Wyatt Salas did not answer the door.  Then at 16:00, patient's friend returned to home and when there was no answer, the friend reported finding Wyatt Salas on the floor, confused, incontinent and alert to name only.  On arrival of EMS, his vitals: BP= 177/99 , HR 119, CBG 144.  In the ED, Wyatt Salas has been agitated and disoriented but arousable.  Moves all extremities well.  He reports pain everywhere and is unable to descirbe or elaborate on the pain.  Wyatt Salas is unable to follow commands but does state that he hasn't eaten or drank in several days "because the government took all money."  He appears dry, blood was noted around his mouth and he was incontinent of urine.  He seems to be talking to characters in the room that are not present and his speech seems incoherent.  [facility] was consulted by ED staff due to his initial bloodwork showing a Tn=1.12.  Of note, Urine tox screen was negative.  Neurology was also consulted and a diagnostic lumbar puncture was performed in the ED.  Patient is slated to be admitted to Hospitalist service for primary work-up and evaluation.  Of note, his baseline functional status is not clearly known  but the home health RN reported to ED nursing staff that the patient ambulates at home with a walker and is apparently able to perform his ADL's.   Problem List: Past Medical History  Diagnosis Date  . Traumatic brain injury   . Diabetes mellitus without complication   . Seizures     No past surgical history on file.   Allergies: No Known Allergies  Home Medications: Prior to Admission medications   Medication Sig Start Date End Date Taking? Authorizing Provider  acetaminophen (TYLENOL) 500 MG tablet Take 500 mg by mouth 2 (two) times daily.     Historical Provider, MD  carbamazepine (TEGRETOL) 200 MG tablet Take 300 mg by mouth at bedtime. Takes 1.5 tabs at bedtime    Historical Provider, MD  docusate sodium (COLACE) 100 MG capsule Take 200 mg by mouth 2 (two) times daily.    Historical Provider, MD  galantamine (RAZADYNE ER) 16 MG 24 hr capsule Take 16 mg by mouth daily with breakfast.    Historical Provider, MD  levETIRAcetam (KEPPRA) 500 MG tablet Take 1,000 mg by mouth every 12 (twelve) hours.    Historical Provider, MD  metFORMIN (GLUCOPHAGE) 500 MG tablet Take 500 mg by mouth daily with breakfast.    Historical Provider, MD  Multiple Vitamin (MULTIVITAMIN WITH MINERALS) TABS tablet Take 1 tablet by mouth daily.    Historical Provider, MD  naproxen (NAPROSYN) 500 MG tablet Take 500 mg by mouth 2 (  two) times daily with a meal.     Historical Provider, MD  phenytoin (DILANTIN) 100 MG ER capsule Take 100 mg by mouth 2 (two) times daily.    Historical Provider, MD  simvastatin (ZOCOR) 80 MG tablet Take 40 mg by mouth at bedtime.    Historical Provider, MD    Inpatient Medications:     (Not in a hospital admission)  No family history on file.   History   Social History  . Marital Status: Divorced    Spouse Name: N/A    Number of Children: N/A  . Years of Education: N/A   Occupational History  . Not on file.   Social History Main Topics  . Smoking status: Current Every  Day Smoker    Types: Cigarettes  . Smokeless tobacco: Not on file  . Alcohol Use: Yes     Comment: rare  . Drug Use: No  . Sexual Activity: Not on file   Other Topics Concern  . Not on file   Social History Narrative  . No narrative on file     Review of Systems: All other systems reviewed and are otherwise negative except as noted above.  Physical Exam: Blood pressure 130/77, pulse 112, temperature 99.5 F (37.5 C), temperature source Rectal, resp. rate 25, height 6' 0.84" (1.85 m), weight 105.2 kg (231 lb 14.8 oz), SpO2 100.00%.  General: Well developed, well nourished, entirely disoriented. Head: Normocephalic, blood noted at lips and gums, sclera non-icteric, no xanthomas, nares are without discharge.  Neck: Negative for carotid bruits. JVD not elevated. Lungs: Clear bilaterally to auscultation without wheezes, rales, or rhonchi. Breathing is unlabored. Heart: Tachycardic rate, regular rhythm, +S1 +S2. No murmurs, rubs, or gallops appreciated. Abdomen: Soft, non-tender, non-distended with normoactive bowel sounds. No hepatomegaly. No rebound/guarding. No obvious abdominal masses. Msk:  Strength and tone appears normal for age. Extremities: No clubbing, cyanosis or edema.  Distal pedal pulses are 2+ and equal bilaterally. Neuro:  Disoriented entirely, moves all extremities spontaneously. Psych:  Unable to evaluate due to altered mentation.  Labs: Recent Labs     07/26/13  1716  07/26/13  2019  WBC  PENDING  21.3*  HGB  12.8*  11.9*  HCT  37.9*  34.9*  MCV  88.3  86.6  PLT  176  196   No results found for this basename: VITAMINB12, FOLATE, FERRITIN, TIBC, IRON, RETICCTPCT,  in the last 72 hours No results found for this basename: DDIMER,  in the last 72 hours  Recent Labs Lab 07/26/13 1716  NA 142  K 3.7  CL 105  CO2 25  BUN 7  CREATININE 0.65  CALCIUM 9.1  PROT 6.8  BILITOT 0.6  ALKPHOS 61  ALT 52  AST 95*  GLUCOSE 122*   No results found for this  basename: HGBA1C,  in the last 72 hours Recent Labs     07/26/13  1716  07/26/13  1719  CKTOTAL  2989*   --   TROPONINI   --   1.12*   No components found with this basename: POCBNP,  No results found for this basename: CHOL, HDL, LDLCALC, TRIG, CHOLHDL, LDLDIRECT,  in the last 72 hours No results found for this basename: TSH, T4TOTAL, FREET3, T3FREE, THYROIDAB,  in the last 72 hours  Radiology/Studies: Ct Cervical Spine Wo Contrast  07/26/2013   CLINICAL DATA:  Altered mental status and neck pain  EXAM: CT HEAD WITHOUT CONTRAST  CT CERVICAL SPINE WITHOUT CONTRAST  TECHNIQUE:  Multidetector CT imaging of the head and cervical spine was performed following the standard protocol without intravenous contrast. Multiplanar CT image reconstructions of the cervical spine were also generated.  COMPARISON:  CT HEAD W/O CM dated 05/19/2013; CT HEAD W/O CM dated 05/31/2009; CT cervical spine May 31, 2009  FINDINGS: CT HEAD FINDINGS  There is mild diffuse atrophy. There is a degree of ex vacuo phenomenon in the right lateral ventricle due to encephalomalacia, stable.  There is evidence of a prior right temporal lobe infarct, primarily involving the posterior aspect of the right temporal lobe, a stable finding. There is no acute appearing infarct present. There is small vessel disease in the centra semiovale bilaterally, more on the right anteriorly than elsewhere. There is no intracranial mass, hemorrhage, extra-axial fluid collection, or midline shift. Gray-white compartments are normal. The bony calvarium appears intact. There is right maxillary and bilateral ethmoid sinus disease.  CT CERVICAL SPINE FINDINGS  Note that there is moderate motion artifact. There is no apparent fracture. There is slight anterolisthesis of C2 on C3, stable. There is 4 mm of anterolisthesis of C3 on C4 which is new but is felt to be due to underlying spondylosis. No other spondylolisthesis is appreciable. Prevertebral soft tissues  and predental space regions are normal.  There is moderate disc space narrowing at C3-4 and C4-5. There is moderately severe disc space narrowing at C5-6 and C6-7. There is facet osteoarthritic change at multiple levels. Exit foraminal narrowing appears most marked at C3-4 on the left, C4-5 bilaterally, C5-6 on the left, and at C6-7 bilaterally.  There is extensive pneumonitis in the visualized right upper lobe.  IMPRESSION: CT head: Prior right temporal lobe infarct. Atrophy with small vessel disease. There is some encephalomalacia on the right with enlargement of the right lateral ventricle compared to the left, stable. No acute appearing infarct is seen. No hemorrhage or mass effect. There is paranasal sinus disease.  CT cervical spine: Extensive osteoarthritic change. No fracture. New mild anterolisthesis of C3 on C4, most likely due to underlying spondylosis. If there is concern for ligamentous injury in this area, it may be reasonable to correlate with lateral flexion and extension views to further evaluate.  There is extensive right upper lobe airspace consolidation. Given the clinical history, this finding must be viewed with concern for potential aspiration.   Electronically Signed   By: Lowella Grip M.D.   On: 07/26/2013 19:16   Dg Chest Portable 1 View  07/26/2013   CLINICAL DATA:  Altered mental status  EXAM: PORTABLE CHEST - 1 VIEW  COMPARISON:  05/19/2013  FINDINGS: Extensive airspace disease within the right upper lobe. Hazy airspace disease in the right lower lung zone. Left lung is clear. Normal heart size. No pneumothorax. Chronic right rib deformities.  IMPRESSION: Right lung airspace disease.   Electronically Signed   By: Maryclare Bean M.D.   On: 07/26/2013 18:00    07/26/13 12-lead ECG:  Sinus tachycardia at 113 bpm, LVH and nonspecific ST changes.  Compared to prior ECG of October 2014, repolarization abnormality is less evident.  ASSESSMENT:  63 yo AA Male with altered mental status.   Historical records seem to suggest a PMHx of occassional marijuna use, DM-Type 2, CVA, SDH, Gonorrhea, HCV, seizure DO, HTN, HLD and GERD.  He was noted to have a positive Troponin and ECG evidence of LVH.  No clear chest pain symptoms but patient unable to provide history.  Likely the patient's biomarker elevation is a representation  of a secondary troponin release due to other systemic process rather than true ACS.  At this point, we support the Hospitalist and Neurologist's efforts to evaluate AMS and medical work-up.  We suggest the following cardiac plan:  RECOMMENDATIONS:  1-Continue to cycle cardiac enzymes until peak and downtrend.  Please add CPK and CK-MB to cycling. 2-Acceptable to dose Aspirin 325mg  po daily (or via OG tube) if patient can take this safely and if no evidence of major bleeding. 3-Suggest 24 hours of Heparin continuous infusion (ACS protocol, pharmacy to dose) when safe or feasible after lumbar puncture (defer to Hospitalist as to timing of Heparin initiation after LP). 4-Will order a 2D surface Echo in AM to evaluate LV function and wall motion. We will update the recommendations based on forthcoming data.  Thank you for allowing [place] to participate in the care of this patient.  We will follow along with you.  Signed, Charlton Haws, MD [facility] [place] Physician  07/26/2013, 9:25 PM

## 2013-07-26 NOTE — ED Provider Notes (Signed)
Medical screening examination/treatment/procedure(s) were conducted as a shared visit with non-physician practitioner(s) and myself.  I personally evaluated the patient during the encounter.  EKG Interpretation   None      Patient seen by me shortly after arrival. Patient with altered mental status and confusion. Will require workup appropriate for this. Patient last seen on Tuesday about 3:00 by a friend. Home health nurse came today at 9:00 in the morning did not get an answer At the 4:00 in the afternoon they returned no answer EMS got involved patient was found on the floor confused alert to name only. Blood pressure was 177/99 heart rate was 119 at the scene blood sugar was 144.  Mervin Kung, MD 07/26/13 [phone]

## 2013-07-27 ENCOUNTER — Inpatient Hospital Stay (HOSPITAL_COMMUNITY): Payer: Medicare Other

## 2013-07-27 DIAGNOSIS — R4182 Altered mental status, unspecified: Secondary | ICD-10-CM

## 2013-07-27 DIAGNOSIS — I214 Non-ST elevation (NSTEMI) myocardial infarction: Secondary | ICD-10-CM

## 2013-07-27 DIAGNOSIS — M6282 Rhabdomyolysis: Secondary | ICD-10-CM

## 2013-07-27 DIAGNOSIS — E119 Type 2 diabetes mellitus without complications: Secondary | ICD-10-CM

## 2013-07-27 DIAGNOSIS — J690 Pneumonitis due to inhalation of food and vomit: Secondary | ICD-10-CM

## 2013-07-27 DIAGNOSIS — I517 Cardiomegaly: Secondary | ICD-10-CM

## 2013-07-27 DIAGNOSIS — G40909 Epilepsy, unspecified, not intractable, without status epilepticus: Principal | ICD-10-CM

## 2013-07-27 LAB — CBC
HCT: 37.9 % — ABNORMAL LOW (ref 39.0–52.0)
HCT: 41.8 % (ref 39.0–52.0)
HEMOGLOBIN: 12.8 g/dL — AB (ref 13.0–17.0)
HEMOGLOBIN: 14.1 g/dL (ref 13.0–17.0)
MCH: 29.8 pg (ref 26.0–34.0)
MCH: 29.6 pg (ref 26.0–34.0)
MCHC: 33.8 g/dL (ref 30.0–36.0)
MCHC: 33.7 g/dL (ref 30.0–36.0)
MCV: 88.3 fL (ref 78.0–100.0)
MCV: 87.8 fL (ref 78.0–100.0)
PLATELETS: 176 10*3/uL (ref 150–400)
Platelets: 153 10*3/uL (ref 150–400)
RBC: 4.29 MIL/uL (ref 4.22–5.81)
RBC: 4.76 MIL/uL (ref 4.22–5.81)
RDW: 13.3 % (ref 11.5–15.5)
RDW: 13.2 % (ref 11.5–15.5)
WBC: 20.2 10*3/uL — ABNORMAL HIGH (ref 4.0–10.5)

## 2013-07-27 LAB — PROTEIN AND GLUCOSE, CSF
Glucose, CSF: 90 mg/dL — ABNORMAL HIGH (ref 43–76)
Total  Protein, CSF: 40 mg/dL (ref 15–45)

## 2013-07-27 LAB — COMPREHENSIVE METABOLIC PANEL
ALBUMIN: 3.4 g/dL — AB (ref 3.5–5.2)
ALT: 55 U/L — AB (ref 0–53)
AST: 106 U/L — AB (ref 0–37)
Alkaline Phosphatase: 64 U/L (ref 39–117)
BUN: 5 mg/dL — ABNORMAL LOW (ref 6–23)
CO2: 22 meq/L (ref 19–32)
CREATININE: 0.45 mg/dL — AB (ref 0.50–1.35)
Calcium: 9.3 mg/dL (ref 8.4–10.5)
Chloride: 104 mEq/L (ref 96–112)
GFR calc Af Amer: >90 mL/min (ref >90)
GFR calc non Af Amer: >90 mL/min (ref >90)
Glucose, Bld: 168 mg/dL — ABNORMAL HIGH (ref 70–99)
Potassium: 5 mEq/L (ref 3.7–5.3)
SODIUM: 142 meq/L (ref 137–147)
Total Bilirubin: 0.6 mg/dL (ref 0.3–1.2)
Total Protein: 7.2 g/dL (ref 6.0–8.3)

## 2013-07-27 LAB — CSF CELL COUNT WITH DIFFERENTIAL
RBC Count, CSF: 9 /mm3 — ABNORMAL HIGH
Tube #: 1
WBC, CSF: 0 /mm3 (ref 0–5)

## 2013-07-27 LAB — GLUCOSE, CAPILLARY
GLUCOSE-CAPILLARY: 115 mg/dL — AB (ref 70–99)
GLUCOSE-CAPILLARY: 118 mg/dL — AB (ref 70–99)
GLUCOSE-CAPILLARY: 125 mg/dL — AB (ref 70–99)
Glucose-Capillary: 183 mg/dL — ABNORMAL HIGH (ref 70–99)
Glucose-Capillary: 167 mg/dL — ABNORMAL HIGH (ref 70–99)

## 2013-07-27 LAB — TSH: TSH: 1.623 u[IU]/mL (ref 0.350–4.500)

## 2013-07-27 LAB — TROPONIN I
Troponin I: 0.42 ng/mL (ref <0.30)
Troponin I: 0.36 ng/mL (ref <0.30)

## 2013-07-27 LAB — HERPES SIMPLEX VIRUS(HSV) DNA BY PCR
HSV 1 DNA: NOT DETECTED
HSV 2 DNA: NOT DETECTED

## 2013-07-27 LAB — MRSA PCR SCREENING: MRSA by PCR: NEGATIVE

## 2013-07-27 LAB — CK: Total CK: 2553 U/L — ABNORMAL HIGH (ref 7–232)

## 2013-07-27 MED ORDER — PHENYTOIN SODIUM EXTENDED 100 MG PO CAPS: 100.0000 mg | ORAL_CAPSULE | Freq: Two times a day (BID) | ORAL | Status: DC

## 2013-07-27 MED ORDER — INSULIN ASPART 100 UNIT/ML ~~LOC~~ SOLN
0.0000 [IU] | SUBCUTANEOUS | Status: DC
  Administered 2013-07-27: 1 [IU] via SUBCUTANEOUS
  Administered 2013-07-28: 2 [IU] via SUBCUTANEOUS

## 2013-07-27 MED ORDER — CHLORHEXIDINE GLUCONATE 0.12 % MT SOLN
15.0000 mL | Freq: Two times a day (BID) | OROMUCOSAL | Status: DC
  Administered 2013-07-27 – 2013-07-31 (×8): 15 mL via OROMUCOSAL
  Filled 2013-07-27 (×11): qty 15

## 2013-07-27 MED ORDER — METOPROLOL TARTRATE 1 MG/ML IV SOLN
2.5000 mg | Freq: Four times a day (QID) | INTRAVENOUS | Status: DC
  Administered 2013-07-27 – 2013-07-28 (×6): 2.5 mg via INTRAVENOUS
  Filled 2013-07-27 (×9): qty 5

## 2013-07-27 MED ORDER — GALANTAMINE HYDROBROMIDE ER 16 MG PO CP24
16.0000 mg | ORAL_CAPSULE | Freq: Every day | ORAL | Status: DC
Start: 2013-07-27 — End: 2013-07-27

## 2013-07-27 MED ORDER — LEVETIRACETAM 500 MG PO TABS: 1000.0000 mg | ORAL_TABLET | Freq: Two times a day (BID) | ORAL | Status: DC

## 2013-07-27 MED ORDER — DOCUSATE SODIUM 100 MG PO CAPS: 200.0000 mg | ORAL_CAPSULE | Freq: Two times a day (BID) | ORAL | Status: DC

## 2013-07-27 MED ORDER — ACETAMINOPHEN 500 MG PO TABS
500.0000 mg | ORAL_TABLET | Freq: Two times a day (BID) | ORAL | Status: DC
  Filled 2013-07-27 (×2): qty 1

## 2013-07-27 MED ORDER — INFLUENZA VAC SPLIT QUAD 0.5 ML IM SUSP
0.5000 mL | INTRAMUSCULAR | Status: AC
  Administered 2013-07-28: 0.5 mL via INTRAMUSCULAR
  Filled 2013-07-27: qty 0.5

## 2013-07-27 MED ORDER — LORAZEPAM 2 MG/ML IJ SOLN: 1.0000 mg | Freq: Once | INTRAMUSCULAR | Status: DC

## 2013-07-27 MED ORDER — ATORVASTATIN CALCIUM 40 MG PO TABS
40.0000 mg | ORAL_TABLET | Freq: Every day | ORAL | Status: DC
  Filled 2013-07-27: qty 1

## 2013-07-27 MED ORDER — INSULIN ASPART 100 UNIT/ML ~~LOC~~ SOLN: 0.0000 [IU] | SUBCUTANEOUS | Status: DC

## 2013-07-27 MED ORDER — BIOTENE DRY MOUTH MT LIQD
15.0000 mL | Freq: Two times a day (BID) | OROMUCOSAL | Status: DC
  Administered 2013-07-28 – 2013-07-31 (×7): 15 mL via OROMUCOSAL

## 2013-07-27 MED ORDER — CARBAMAZEPINE 200 MG PO TABS
300.0000 mg | ORAL_TABLET | Freq: Every day | ORAL | Status: DC
  Administered 2013-07-27: 300 mg via ORAL
  Filled 2013-07-27 (×2): qty 1.5

## 2013-07-27 MED ORDER — SODIUM CHLORIDE 0.9 % IJ SOLN
3.0000 mL | Freq: Two times a day (BID) | INTRAMUSCULAR | Status: DC
  Administered 2013-07-27 – 2013-07-30 (×7): 3 mL via INTRAVENOUS

## 2013-07-27 MED ORDER — CLINDAMYCIN PHOSPHATE 600 MG/50ML IV SOLN
600.0000 mg | Freq: Three times a day (TID) | INTRAVENOUS | Status: DC
Start: 2013-07-27 — End: 2013-07-28
  Administered 2013-07-27 – 2013-07-28 (×4): 600 mg via INTRAVENOUS
  Filled 2013-07-27 (×8): qty 50

## 2013-07-27 MED ORDER — HALOPERIDOL LACTATE 5 MG/ML IJ SOLN
2.5000 mg | Freq: Once | INTRAMUSCULAR | Status: AC
  Administered 2013-07-27: 2.5 mg via INTRAVENOUS
  Filled 2013-07-27: qty 1

## 2013-07-27 MED ORDER — ADULT MULTIVITAMIN W/MINERALS CH
1.0000 | ORAL_TABLET | Freq: Every day | ORAL | Status: DC
  Administered 2013-07-28 – 2013-07-31 (×4): 1 via ORAL
  Filled 2013-07-27 (×5): qty 1

## 2013-07-27 MED ORDER — DEXTROSE-NACL 5-0.9 % IV SOLN
INTRAVENOUS | Status: DC
  Administered 2013-07-27: 01:00:00 via INTRAVENOUS

## 2013-07-27 MED ORDER — SODIUM CHLORIDE 0.9 % IV SOLN
INTRAVENOUS | Status: DC
  Administered 2013-07-27 – 2013-07-28 (×3): via INTRAVENOUS

## 2013-07-27 MED ORDER — PNEUMOCOCCAL VAC POLYVALENT 25 MCG/0.5ML IJ INJ
0.5000 mL | INJECTION | INTRAMUSCULAR | Status: AC
  Administered 2013-07-28: 0.5 mL via INTRAMUSCULAR
  Filled 2013-07-27: qty 0.5

## 2013-07-27 MED ORDER — CARBAMAZEPINE 200 MG PO TABS: 300.0000 mg | ORAL_TABLET | Freq: Every day | ORAL | Status: DC

## 2013-07-27 NOTE — Progress Notes (Signed)
EEG completed; results pending.    

## 2013-07-27 NOTE — Progress Notes (Signed)
NEURO HOSPITALIST PROGRESS NOTE   SUBJECTIVE:                                                                                                                        Patient is lert and oriented to self. Follow simple commands and one step direction direction. Able to communicate his needs intermittently. Mental status is either slightly better or the same since admission per nursing staff reports. No worsening of mental status change reported overnight. No seizure activities noted since admission.   LP nonrevealing.  Head CT OBJECTIVE:                                                                                                                           Vital signs in last 24 hours: Temp:  [97.5 F (36.4 C)-99.5 F (37.5 C)] 98.8 F (37.1 C) (01/08 0745) Pulse Rate:  [99-115] 109 (01/08 0340) Resp:  [11-25] 23 (01/08 0340) BP: (94-159)/(59-129) 142/90 mmHg (01/08 0745) SpO2:  [94 %-100 %] 100 % (01/08 0340) Weight:  [168 lb 10.4 oz (76.5 kg)-231 lb 14.8 oz (105.2 kg)] 168 lb 10.4 oz (76.5 kg) (01/08 0007)  Intake/Output from previous day: 01/07 0701 - 01/08 0700 In: 4700 [I.V.:4700] Out: 1650 [Urine:1650] Intake/Output this shift: Total I/O In: 100 [I.V.:100] Out: 650 [Urine:650] Nutritional status: NPO  Past Medical History  Diagnosis Date  . Traumatic brain injury   . Diabetes mellitus without complication   . Seizures       Neurologic Exam:   Mental Status:  Patient is alert, only oriented to self Able to answer simple questions and follow simple commands.  No signs of aphasia or neglect   Cranial Nerves:  II: blinks to threat bilaterally Pupils are equal, round, and reactive to light. III,IV, VI: EOMI without ptosis or diploplia.  V: Facial sensation is symmetric. VII: Facial movement is mildly diminished on left.  VIII: hearing intact bilaterally X: Uvula elevates symmetrically  XI: Shoulder shrug is symmetric.  XII:  tongue is midline without atrophy or fasciculations.  Motor:  Tone is normal. Bulk is normal. 5/5 Salas was present on the right, does not cooperate, but appears to have mild weakness on left. Follows commands to give thumbs up bilaterally.  Sensory:  respnods to stimuli bilaterally  Deep Tendon Reflexes:  2+ and symmetric in the biceps and patellae.  Cerebellar:  Unable to assess secondary to patient's altered mental status.  Gait:  Unable to assess secondary to patient's altered mental status.    Lab Results: Lab Results  Component Value Date/Time   CHOL  Value: 102        ATP III CLASSIFICATION:  <200     mg/dL   Desirable  200-239  mg/dL   Borderline High  >=240    mg/dL   High        04/21/2010  4:12 AM   Lipid Panel No results found for this basename: CHOL, TRIG, HDL, CHOLHDL, VLDL, LDLCALC,  in the last 72 hours  Studies/Results: Ct Cervical Spine Wo Contrast  07/26/2013   CLINICAL DATA:  Altered mental status and neck pain  EXAM: CT HEAD WITHOUT CONTRAST  CT CERVICAL SPINE WITHOUT CONTRAST  TECHNIQUE: Multidetector CT imaging of the head and cervical spine was performed following the standard protocol without intravenous contrast. Multiplanar CT image reconstructions of the cervical spine were also generated.  COMPARISON:  CT HEAD W/O CM dated 05/19/2013; CT HEAD W/O CM dated 05/31/2009; CT cervical spine May 31, 2009  FINDINGS: CT HEAD FINDINGS  There is mild diffuse atrophy. There is a degree of ex vacuo phenomenon in the right lateral ventricle due to encephalomalacia, stable.  There is evidence of a prior right temporal lobe infarct, primarily involving the posterior aspect of the right temporal lobe, a stable finding. There is no acute appearing infarct present. There is small vessel disease in the centra semiovale bilaterally, more on the right anteriorly than elsewhere. There is no intracranial mass, hemorrhage, extra-axial fluid collection, or midline shift. Gray-white  compartments are normal. The bony calvarium appears intact. There is right maxillary and bilateral ethmoid sinus disease.  CT CERVICAL SPINE FINDINGS  Note that there is moderate motion artifact. There is no apparent fracture. There is slight anterolisthesis of C2 on C3, stable. There is 4 mm of anterolisthesis of C3 on C4 which is new but is felt to be due to underlying spondylosis. No other spondylolisthesis is appreciable. Prevertebral soft tissues and predental space regions are normal.  There is moderate disc space narrowing at C3-4 and C4-5. There is moderately severe disc space narrowing at C5-6 and C6-7. There is facet osteoarthritic change at multiple levels. Exit foraminal narrowing appears most marked at C3-4 on the left, C4-5 bilaterally, C5-6 on the left, and at C6-7 bilaterally.  There is extensive pneumonitis in the visualized right upper lobe.  IMPRESSION: CT head: Prior right temporal lobe infarct. Atrophy with small vessel disease. There is some encephalomalacia on the right with enlargement of the right lateral ventricle compared to the left, stable. No acute appearing infarct is seen. No hemorrhage or mass effect. There is paranasal sinus disease.  CT cervical spine: Extensive osteoarthritic change. No fracture. New mild anterolisthesis of C3 on C4, most likely due to underlying spondylosis. If there is concern for ligamentous injury in this area, it may be reasonable to correlate with lateral flexion and extension views to further evaluate.  There is extensive right upper lobe airspace consolidation. Given the clinical history, this finding must be viewed with concern for potential aspiration.   Electronically Signed   By: Lowella Grip M.D.   On: 07/26/2013 19:16   Dg Chest Portable 1 View  07/26/2013   CLINICAL DATA:  Altered mental status  EXAM:  PORTABLE CHEST - 1 VIEW  COMPARISON:  05/19/2013  FINDINGS: Extensive airspace disease within the right upper lobe. Hazy airspace disease in  the right lower lung zone. Left lung is clear. Normal heart size. No pneumothorax. Chronic right rib deformities.  IMPRESSION: Right lung airspace disease.   Electronically Signed   By: Maryclare Bean M.D.   On: 07/26/2013 18:00    MEDICATIONS                                                                                                                       I have reviewed the patient's current medications.  ASSESSMENT/PLAN:                                                                                                             Wyatt Salas is an 63 y.o. male with hx PMHx of occassional marijuna use, DM-Type 2, CVA, SDH, Gonorrhea, HCV, right SDH and subsequent seizure disorders, HTN, HLD and GERD, who was ound at home dis-shoveled and confused on the floor with bowel and bladder incontinence. He was noted be unresponsive initially and later was able to answer some direct questions. Patient was admitted for the evaluation of Aspiration PNA, Rhabdomyolysis, NSTEMI and acute encephalopathy.  LP was nonrevealing on admission. The neurology is consulted for the evaluation of acute encephalopathy, and possible seizure activity since Tegretal and Dilantin level were both subtherapeutic.   He has a history of a subdural hematoma which is felt to be the cause of his seizures. He has left-sided deficits secondary to the subdural hematoma.  # acute encephalopathy       Patient with h/o right SDH and subsequent seizure disorder presented with Aspiration PNA, Rhabdomyolysis, NSTEMI and acute encephalopathy. Neurology is consulted for the evaluation of encephalopathy. His Head CT was negative for acute abnormality. EEG pending.        The etiology of his altered mental status is unclear. Given his clinical presentation, he could have seizure activity with post ictal confusion in the setting of sub therapeutic AED levels. And his infection and NSTEMI could all be contributing factors. The other etiologies  include the following:         Primary etiologies include Stroke, Seizure, infection, trauma/concussion, Hydrocephalus/hematoma, venous thrombosis, CNA vasculitis/TTP. The systemic etiologies include cardiac ( CHF/HTN), Pulmonary, liver/renal failure, electrolytes imbalance ( Hyper/hyponatremia), Endocrine ( DKA, thyroid disorders or addison crisis). ID ( Sepsis), Hypo/hyperthemia, Medications/alcohol/toxins.     Plan - EEG pending - AED:  Dilantin loading dose given last night  Dilantin 100 mg po BID and Keppra 1000 BID  Tegretol on hold due to NPO status   Assessment and plan discussed with with attending physician and they are in agreement.   Charlann Lange, MD PGY-3  [facility]  [phone] pager    07/27/2013, 9:32 AM  Rush Farmer M.D. [facility] [phone]

## 2013-07-27 NOTE — Progress Notes (Signed)
INITIAL NUTRITION ASSESSMENT  DOCUMENTATION CODES Per approved criteria  -Not Applicable   INTERVENTION: Recommend Speech Path consult for swallow evaluation; add interventions accordingly RD to follow for nutrition care plan  NUTRITION DIAGNOSIS: Inadequate oral intake related to inability to eat, acute encephalopathy as evidenced by NPO status  Goal: Pt to meet >/= 90% of their estimated nutrition needs   Monitor:  PO diet advancement & intake, weight, labs, I/O's  Reason for Assessment: Malnutrition Screening Tool Report  63 y.o. male  Admitting Dx: Altered mental status  ASSESSMENT: Patient with PMHx of occassional marijuna use, DM, CVA, HTN and GERD; found at home dis-shoveled and confused on the floor, having bowel and bladder incontinence; CXR suspicious for aspiration PNA; admitted for further evaluation.  Patient in process of receiving ECHO upon RD visit; patient is confused; Neurology note reviewed -- etiology of patient's AMS is unclear; per weight readings, patient has had a 27% weight loss since October 2014 (severe for time frame); patient remains NPO since admission; would benefit from swallow evaluation given ? aspiration PNA; RD to monitor nutrition care plan.  RD unable to obtain nutrition/PO intake hx or complete Nutrition Focused Physical Exam at this time for possible malnutrition identification.   Height: Ht Readings from Last 1 Encounters:  07/27/13 6\' 1"  (1.854 m)    Weight: Wt Readings from Last 1 Encounters:  07/27/13 168 lb 10.4 oz (76.5 kg)    Ideal Body Weight: 184 lb  % Ideal Body Weight: 91%  Wt Readings from Last 10 Encounters:  07/27/13 168 lb 10.4 oz (76.5 kg)  05/19/13 232 lb (105.235 kg)    Usual Body Weight: 232 lb  % Usual Body Weight: 72%  BMI:  Body mass index is 22.26 kg/(m^2).  Estimated Nutritional Needs: Kcal: 2000-2200 Protein: 100-110 gm Fluid: 2.0-2.2 L  Skin: diabetic foot ulcers (R & L)  Diet Order:  NPO  EDUCATION NEEDS: -No education needs identified at this time   Intake/Output Summary (Last 24 hours) at 07/27/13 1534 Last data filed at 07/27/13 1100  Gross per 24 hour  Intake   4900 ml  Output   2750 ml  Net   2150 ml    Labs:   Recent Labs Lab 07/26/13 1716 07/27/13 0300  NA 142 142  K 3.7 5.0  CL 105 104  CO2 25 22  BUN 7 5*  CREATININE 0.65 0.45*  CALCIUM 9.1 9.3  GLUCOSE 122* 168*    CBG (last 3)   Recent Labs  07/27/13 0835 07/27/13 1201  GLUCAP 183* 167*    Scheduled Meds: . antiseptic oral rinse  15 mL Mouth Rinse q12n4p  . chlorhexidine  15 mL Mouth Rinse BID  . clindamycin (CLEOCIN) IV  600 mg Intravenous Q8H  . [START ON 07/28/2013] influenza vac split quadrivalent PF  0.5 mL Intramuscular Tomorrow-1000  . insulin aspart  0-9 Units Subcutaneous Q4H  . levETIRAcetam  1,000 mg Intravenous Q12H  . LORazepam  1 mg Intravenous Once  . metoprolol  2.5 mg Intravenous Q6H  . multivitamin with minerals  1 tablet Oral Daily  . phenytoin (DILANTIN) IV  100 mg Intravenous Q12H  . [START ON 07/28/2013] pneumococcal 23 valent vaccine  0.5 mL Intramuscular Tomorrow-1000  . sodium chloride  3 mL Intravenous Q12H    Continuous Infusions: . sodium chloride Stopped (07/26/13 2313)  . sodium chloride 100 mL/hr at 07/27/13 1100    Past Medical History  Diagnosis Date  . Traumatic brain injury   .  Diabetes mellitus without complication   . Seizures     History reviewed. No pertinent past surgical history.  Arthur Holms, RD, LDN Pager #: [phone] After-Hours Pager #: [phone]

## 2013-07-27 NOTE — Procedures (Signed)
ELECTROENCEPHALOGRAM REPORT   Patient: Wyatt Salas       Room #: 0X73 EEG No. ID: [id number] Age: 63 y.o.        Sex: male Referring Physician: Rizwan Report Date:  07/27/2013        Interpreting Physician: Anthony Sar  History: [name] is an 63 y.o. male with a history of subdural hematoma and seizure disorder who was found confused and in the shoulders surroundings. Patient also had low-grade fever. Lumbar puncture results were unremarkable.  Indications for study:  Assess severity of encephalopathy; rule out seizure activity.  Technique: This is an 18 channel routine scalp EEG performed at the bedside with bipolar and monopolar montages arranged in accordance to the international 10/20 system of electrode placement.   Description: This EEG recording was performed during wakefulness and during sleep. Dominant background activity during wakefulness consisted of a symmetrical slowing of cerebral activity with  Diffuse mixed irregular delta and theta activity which was more pronounced involving right hemisphere with increased amplitude and worse slowing of activity, particularly involving the right temporal region. Photic stimulation was not performed. Hyperventilation was not performed. Symmetrical sleep spindles, vertex waves and arousal responses were recorded during stage II of sleep. No epileptiform discharges were seen.  Interpretation: This EEG is abnormal with asymmetric continuous slowing of cerebral activity, with mild slowing of cerebral activity on the left and moderately severe slowing on the right, most pronounced involving right temporal region. No epileptiform activity was demonstrated.   Rush Farmer M.D. [facility] [phone]

## 2013-07-27 NOTE — Progress Notes (Signed)
Echocardiogram 2D Echocardiogram has been performed.  Wyatt Salas 07/27/2013, 3:25 PM

## 2013-07-27 NOTE — Progress Notes (Signed)
Utilization review completed.  

## 2013-07-27 NOTE — Progress Notes (Signed)
Patient Name: Wyatt Salas Date of Encounter: 07/27/2013     Principal Problem:   Altered mental status Active Problems:   Seizure disorder   NSTEMI (non-ST elevated myocardial infarction)   DM2 (diabetes mellitus, type 2)   Hyperlipidemia   Aspiration pneumonia   Rhabdomyolysis    SUBJECTIVE  Patient denies any chest pain or dyspnea. Still confused.  CURRENT MEDS . antiseptic oral rinse  15 mL Mouth Rinse q12n4p  . chlorhexidine  15 mL Mouth Rinse BID  . clindamycin (CLEOCIN) IV  600 mg Intravenous Q8H  . [START ON 07/28/2013] influenza vac split quadrivalent PF  0.5 mL Intramuscular Tomorrow-1000  . insulin aspart  0-9 Units Subcutaneous Q4H  . levETIRAcetam  1,000 mg Intravenous Q12H  . LORazepam  1 mg Intravenous Once  . multivitamin with minerals  1 tablet Oral Daily  . phenytoin (DILANTIN) IV  100 mg Intravenous Q12H  . [START ON 07/28/2013] pneumococcal 23 valent vaccine  0.5 mL Intramuscular Tomorrow-1000  . sodium chloride  3 mL Intravenous Q12H    OBJECTIVE  Filed Vitals:   07/27/13 0340 07/27/13 0400 07/27/13 0745 07/27/13 1100  BP: 150/92  142/90 148/78  Pulse: 109     Temp:  97.5 F (36.4 C) 98.8 F (37.1 C) 97.8 F (36.6 C)  TempSrc:  Oral Axillary Axillary  Resp: 23     Height:      Weight:      SpO2: 100%       Intake/Output Summary (Last 24 hours) at 07/27/13 1336 Last data filed at 07/27/13 1100  Gross per 24 hour  Intake   4900 ml  Output   2750 ml  Net   2150 ml   Filed Weights   07/26/13 2000 07/27/13 0007  Weight: 231 lb 14.8 oz (105.2 kg) 168 lb 10.4 oz (76.5 kg)    PHYSICAL EXAM  General: Pleasant, NAD. Neuro: Marland Kitchen Moves all extremities spontaneously. Psych: Normal affect. HEENT:  Normal  Neck: Supple without bruits or JVD. Lungs:  Resp regular and unlabored, CTA. Heart: RRR no s3, s4, or murmurs. Tachycardic. Abdomen: Soft, non-tender, non-distended, BS + x 4.  Extremities: No clubbing, cyanosis or edema. DP/PT/Radials  2+ and equal bilaterally.  Accessory Clinical Findings  CBC  Recent Labs  07/26/13 2019 07/27/13 0300  WBC 21.3* 20.2*  NEUTROABS 19.3*  --   HGB 11.9* 14.1  HCT 34.9* 41.8  MCV 86.6 87.8  PLT 196 277   Basic Metabolic Panel  Recent Labs  07/26/13 1716 07/27/13 0300  NA 142 142  K 3.7 5.0  CL 105 104  CO2 25 22  GLUCOSE 122* 168*  BUN 7 5*  CREATININE 0.65 0.45*  CALCIUM 9.1 9.3   Liver Function Tests  Recent Labs  07/26/13 1716 07/27/13 0300  AST 95* 106*  ALT 52 55*  ALKPHOS 61 64  BILITOT 0.6 0.6  PROT 6.8 7.2  ALBUMIN 3.2* 3.4*   No results found for this basename: LIPASE, AMYLASE,  in the last 72 hours Cardiac Enzymes  Recent Labs  07/26/13 1716 07/26/13 1719 07/27/13 0020 07/27/13 0300 07/27/13 0615  CKTOTAL 2989*  --   --  2553*  --   TROPONINI  --  1.12* 0.42*  --  0.36*   BNP No components found with this basename: POCBNP,  D-Dimer No results found for this basename: DDIMER,  in the last 72 hours Hemoglobin A1C No results found for this basename: HGBA1C,  in the last 72 hours  Fasting Lipid Panel No results found for this basename: CHOL, HDL, LDLCALC, TRIG, CHOLHDL, LDLDIRECT,  in the last 72 hours Thyroid Function Tests No results found for this basename: TSH, T4TOTAL, FREET3, T3FREE, THYROIDAB,  in the last 72 hours  TELE  Sinus tachycardia.  ECG Sinus tachycardia Probable LVH with secondary repol abnrm Anterior ST elevation, probably due to LVH   Radiology/Studies  Ct Cervical Spine Wo Contrast  07/26/2013   CLINICAL DATA:  Altered mental status and neck pain  EXAM: CT HEAD WITHOUT CONTRAST  CT CERVICAL SPINE WITHOUT CONTRAST  TECHNIQUE: Multidetector CT imaging of the head and cervical spine was performed following the standard protocol without intravenous contrast. Multiplanar CT image reconstructions of the cervical spine were also generated.  COMPARISON:  CT HEAD W/O CM dated 05/19/2013; CT HEAD W/O CM dated 05/31/2009;  CT cervical spine May 31, 2009  FINDINGS: CT HEAD FINDINGS  There is mild diffuse atrophy. There is a degree of ex vacuo phenomenon in the right lateral ventricle due to encephalomalacia, stable.  There is evidence of a prior right temporal lobe infarct, primarily involving the posterior aspect of the right temporal lobe, a stable finding. There is no acute appearing infarct present. There is small vessel disease in the centra semiovale bilaterally, more on the right anteriorly than elsewhere. There is no intracranial mass, hemorrhage, extra-axial fluid collection, or midline shift. Gray-white compartments are normal. The bony calvarium appears intact. There is right maxillary and bilateral ethmoid sinus disease.  CT CERVICAL SPINE FINDINGS  Note that there is moderate motion artifact. There is no apparent fracture. There is slight anterolisthesis of C2 on C3, stable. There is 4 mm of anterolisthesis of C3 on C4 which is new but is felt to be due to underlying spondylosis. No other spondylolisthesis is appreciable. Prevertebral soft tissues and predental space regions are normal.  There is moderate disc space narrowing at C3-4 and C4-5. There is moderately severe disc space narrowing at C5-6 and C6-7. There is facet osteoarthritic change at multiple levels. Exit foraminal narrowing appears most marked at C3-4 on the left, C4-5 bilaterally, C5-6 on the left, and at C6-7 bilaterally.  There is extensive pneumonitis in the visualized right upper lobe.  IMPRESSION: CT head: Prior right temporal lobe infarct. Atrophy with small vessel disease. There is some encephalomalacia on the right with enlargement of the right lateral ventricle compared to the left, stable. No acute appearing infarct is seen. No hemorrhage or mass effect. There is paranasal sinus disease.  CT cervical spine: Extensive osteoarthritic change. No fracture. New mild anterolisthesis of C3 on C4, most likely due to underlying spondylosis. If there  is concern for ligamentous injury in this area, it may be reasonable to correlate with lateral flexion and extension views to further evaluate.  There is extensive right upper lobe airspace consolidation. Given the clinical history, this finding must be viewed with concern for potential aspiration.   Electronically Signed   By: Lowella Grip M.D.   On: 07/26/2013 19:16   Dg Chest Portable 1 View  07/26/2013   CLINICAL DATA:  Altered mental status  EXAM: PORTABLE CHEST - 1 VIEW  COMPARISON:  05/19/2013  FINDINGS: Extensive airspace disease within the right upper lobe. Hazy airspace disease in the right lower lung zone. Left lung is clear. Normal heart size. No pneumothorax. Chronic right rib deformities.  IMPRESSION: Right lung airspace disease.   Electronically Signed   By: Maryclare Bean M.D.   On: 07/26/2013 18:00  ASSESSMENT AND PLAN 1. Mild elevation of troponins, now trending down, probably secondary to tachycardia and pneumonia/sepsis. Doubt primary cardiac event. 2. Hypertension. 3. Probable pneumonia ? Aspiration. 4. Altered mental status. 5. Markedly elevated CK total suggests possible rhabdomyolysis (found on floor for unknown duration of time).  Plan: IV lopressor for tachycardia. Await 2D echo. Follow up EKG in am  Signed, Darlin Coco MD
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
## 2013-07-28 ENCOUNTER — Encounter (HOSPITAL_COMMUNITY): Payer: Self-pay | Admitting: Physician Assistant

## 2013-07-28 LAB — LEVETIRACETAM LEVEL: Levetiracetam Lvl: 19.6 ug/mL (ref 5.0–30.0)

## 2013-07-28 LAB — BASIC METABOLIC PANEL
BUN: 5 mg/dL — ABNORMAL LOW (ref 6–23)
CALCIUM: 8.8 mg/dL (ref 8.4–10.5)
CO2: 17 mEq/L — ABNORMAL LOW (ref 19–32)
Chloride: 104 mEq/L (ref 96–112)
Creatinine, Ser: 0.49 mg/dL — ABNORMAL LOW (ref 0.50–1.35)
Glucose, Bld: 92 mg/dL (ref 70–99)
POTASSIUM: 4.3 meq/L (ref 3.7–5.3)
SODIUM: 141 meq/L (ref 137–147)

## 2013-07-28 LAB — GLUCOSE, CAPILLARY
Glucose-Capillary: 105 mg/dL — ABNORMAL HIGH (ref 70–99)
Glucose-Capillary: 102 mg/dL — ABNORMAL HIGH (ref 70–99)
Glucose-Capillary: 104 mg/dL — ABNORMAL HIGH (ref 70–99)
Glucose-Capillary: 171 mg/dL — ABNORMAL HIGH (ref 70–99)
Glucose-Capillary: 297 mg/dL — ABNORMAL HIGH (ref 70–99)

## 2013-07-28 LAB — CBC
HCT: 37.7 % — ABNORMAL LOW (ref 39.0–52.0)
HEMOGLOBIN: 12.4 g/dL — AB (ref 13.0–17.0)
MCH: 29.1 pg (ref 26.0–34.0)
MCHC: 32.9 g/dL (ref 30.0–36.0)
MCV: 88.5 fL (ref 78.0–100.0)
PLATELETS: 151 10*3/uL (ref 150–400)
RBC: 4.26 MIL/uL (ref 4.22–5.81)
RDW: 13.5 % (ref 11.5–15.5)
WBC: 15.2 10*3/uL — AB (ref 4.0–10.5)

## 2013-07-28 MED ORDER — LEVETIRACETAM 500 MG PO TABS
1000.0000 mg | ORAL_TABLET | Freq: Two times a day (BID) | ORAL | Status: DC
  Administered 2013-07-28 – 2013-07-31 (×6): 1000 mg via ORAL
  Filled 2013-07-28 (×7): qty 2

## 2013-07-28 MED ORDER — CARBAMAZEPINE 200 MG PO TABS
200.0000 mg | ORAL_TABLET | Freq: Two times a day (BID) | ORAL | Status: DC
  Administered 2013-07-28 – 2013-07-31 (×6): 200 mg via ORAL
  Filled 2013-07-28 (×7): qty 1

## 2013-07-28 MED ORDER — DOCUSATE SODIUM 100 MG PO CAPS
200.0000 mg | ORAL_CAPSULE | Freq: Two times a day (BID) | ORAL | Status: DC
  Administered 2013-07-28 – 2013-07-31 (×6): 200 mg via ORAL
  Filled 2013-07-28 (×6): qty 2

## 2013-07-28 MED ORDER — PHENYTOIN SODIUM EXTENDED 100 MG PO CAPS
100.0000 mg | ORAL_CAPSULE | Freq: Two times a day (BID) | ORAL | Status: DC
Start: 2013-07-28 — End: 2013-07-31
  Administered 2013-07-28 – 2013-07-31 (×6): 100 mg via ORAL
  Filled 2013-07-28 (×7): qty 1

## 2013-07-28 MED ORDER — INSULIN ASPART 100 UNIT/ML ~~LOC~~ SOLN
0.0000 [IU] | Freq: Three times a day (TID) | SUBCUTANEOUS | Status: DC
  Administered 2013-07-29 – 2013-07-30 (×4): 1 [IU] via SUBCUTANEOUS
  Administered 2013-07-30 – 2013-07-31 (×3): 2 [IU] via SUBCUTANEOUS

## 2013-07-28 MED ORDER — ATORVASTATIN CALCIUM 40 MG PO TABS
40.0000 mg | ORAL_TABLET | Freq: Every day | ORAL | Status: DC
  Administered 2013-07-28 – 2013-07-30 (×3): 40 mg via ORAL
  Filled 2013-07-28 (×5): qty 1

## 2013-07-28 MED ORDER — GALANTAMINE HYDROBROMIDE 4 MG PO TABS
8.0000 mg | ORAL_TABLET | Freq: Two times a day (BID) | ORAL | Status: DC
  Administered 2013-07-28 – 2013-07-31 (×6): 8 mg via ORAL
  Filled 2013-07-28 (×8): qty 2

## 2013-07-28 NOTE — Progress Notes (Signed)
Pt arrived to 4N02, see doc flowsheets for assessment.

## 2013-07-28 NOTE — Progress Notes (Signed)
Patient Name: Wyatt Salas Date of Encounter: 07/28/2013     Principal Problem:   Altered mental status Active Problems:   Seizure disorder   NSTEMI (non-ST elevated myocardial infarction)   DM2 (diabetes mellitus, type 2)   Hyperlipidemia   Aspiration pneumonia   Rhabdomyolysis    SUBJECTIVE  Feels fine. No chest pain or SOB.  CURRENT MEDS . antiseptic oral rinse  15 mL Mouth Rinse q12n4p  . carbamazepine  300 mg Oral QHS  . chlorhexidine  15 mL Mouth Rinse BID  . clindamycin (CLEOCIN) IV  600 mg Intravenous Q8H  . insulin aspart  0-9 Units Subcutaneous Q4H  . levETIRAcetam  1,000 mg Intravenous Q12H  . LORazepam  1 mg Intravenous Once  . metoprolol  2.5 mg Intravenous Q6H  . multivitamin with minerals  1 tablet Oral Daily  . phenytoin (DILANTIN) IV  100 mg Intravenous Q12H  . sodium chloride  3 mL Intravenous Q12H    OBJECTIVE  Filed Vitals:   07/27/13 2322 07/28/13 0315 07/28/13 0605 07/28/13 0757  BP: 139/87 127/87 147/88 145/95  Pulse: 102 95 95 93  Temp: 98.4 F (36.9 C) 98.4 F (36.9 C)  98.5 F (36.9 C)  TempSrc: Oral Oral  Oral  Resp: 19 15 16 14   Height:      Weight:      SpO2: 100% 99% 99% 97%    Intake/Output Summary (Last 24 hours) at 07/28/13 1027 Last data filed at 07/28/13 0800  Gross per 24 hour  Intake   2485 ml  Output   1600 ml  Net    885 ml   Filed Weights   07/26/13 2000 07/27/13 0007  Weight: 231 lb 14.8 oz (105.2 kg) 168 lb 10.4 oz (76.5 kg)    PHYSICAL EXAM  General: Pleasant, NAD. Neuro: Alert and oriented X 3. Moves all extremities spontaneously. Psych: Normal affect. HEENT:  Normal  Neck: Supple without bruits or JVD. Lungs:  Resp regular and unlabored, CTA. Heart: RRR no s3, s4, or murmurs. Abdomen: Soft, non-tender, non-distended, BS + x 4.  Extremities: No clubbing, cyanosis or edema. DP/PT/Radials 2+ and equal bilaterally.  Accessory Clinical Findings  CBC  Recent Labs  07/26/13 2019  07/27/13 0300 07/28/13 0315  WBC 21.3* 20.2* 15.2*  NEUTROABS 19.3*  --   --   HGB 11.9* 14.1 12.4*  HCT 34.9* 41.8 37.7*  MCV 86.6 87.8 88.5  PLT 196 153 412   Basic Metabolic Panel  Recent Labs  07/27/13 0300 07/28/13 0315  NA 142 141  K 5.0 4.3  CL 104 104  CO2 22 17*  GLUCOSE 168* 92  BUN 5* 5*  CREATININE 0.45* 0.49*  CALCIUM 9.3 8.8   Liver Function Tests  Recent Labs  07/26/13 1716 07/27/13 0300  AST 95* 106*  ALT 52 55*  ALKPHOS 61 64  BILITOT 0.6 0.6  PROT 6.8 7.2  ALBUMIN 3.2* 3.4*    Cardiac Enzymes  Recent Labs  07/26/13 1716  07/27/13 0020 07/27/13 0300 07/27/13 0615 07/27/13 1220  CKTOTAL 2989*  --   --  2553*  --   --   TROPONINI  --   < > 0.42*  --  0.36* <0.30  < > = values in this interval not displayed.   Thyroid Function Tests  Recent Labs  07/27/13 0204  TSH 1.623    TELE  Disconnected as moving rooms  ECG NSR  Radiology/Studies Study Date: 07/27/2013 ------------------------------------------------------------ LV EF: 55% -  60% ------------------------------------------------------------ Indications: Chest pain 786.51. MI - acute 410.91. ------------------------------------------------------------ Study Conclusions Left ventricle: The cavity size was normal. Wall thickness was increased in a pattern of mild LVH. Systolic function was normal. The estimated ejection fraction was in the range of 55% to 60%. Doppler parameters are consistent with abnormal left ventricular relaxation (grade 1 diastolic dysfunction). Transthoracic echocardiography. M-mode, complete 2D, spectral Doppler, and color Doppler. Height: Height: 185.4cm. Height: 73in. Weight: Weight: 68kg. Weight: 149.7lb. Body mass index: BMI: 19.8kg/m^2. Body surface area: BSA: 1.40m^2. Blood pressure: 148/78. Patient status: Inpatient. Location:  Bedside. ------------------------------------------------------------ ------------------------------------------------------------ Left ventricle: The cavity size was normal. Wall thickness was increased in a pattern of mild LVH. Systolic function was normal. The estimated ejection fraction was in the range of 55% to 60%. Doppler parameters are consistent with abnormal left ventricular relaxation (grade 1 diastolic dysfunction). ------------------------------------------------------------ Aortic valve: Mildly thickened, mildly calcified leaflets. Doppler: No significant regurgitation. ------------------------------------------------------------ Mitral valve: Mildly thickened leaflets . Doppler: No significant regurgitation. ------------------------------------------------------------ Left atrium: The atrium was normal in size. ------------------------------------------------------------ Right ventricle: The cavity size was normal. Wall thickness was normal. Systolic function was normal. ------------------------------------------------------------ Tricuspid valve: Structurally normal valve. Leaflet separation was normal. Doppler: Transvalvular velocity was within the normal range. No regurgitation. ------------------------------------------------------------ Right atrium: The atrium was normal in size. ------------------------------------------------------------ Pericardium: There was no pericardial effusion. ------------------------------------------------------------ Systemic veins: Inferior vena cava: The vessel was mildly dilated.    Ct Cervical Spine Wo Contrast  07/26/2013   CLINICAL DATA:  Altered mental status and neck pain  EXAM: CT HEAD WITHOUT CONTRAST  CT CERVICAL SPINE WITHOUT CONTRAST  TECHNIQUE: Multidetector CT imaging of the head and cervical spine was performed following the standard protocol without intravenous contrast. Multiplanar CT image reconstructions of the  cervical spine were also generated.  COMPARISON:  CT HEAD W/O CM dated 05/19/2013; CT HEAD W/O CM dated 05/31/2009; CT cervical spine May 31, 2009  FINDINGS: CT HEAD FINDINGS  There is mild diffuse atrophy. There is a degree of ex vacuo phenomenon in the right lateral ventricle due to encephalomalacia, stable.  There is evidence of a prior right temporal lobe infarct, primarily involving the posterior aspect of the right temporal lobe, a stable finding. There is no acute appearing infarct present. There is small vessel disease in the centra semiovale bilaterally, more on the right anteriorly than elsewhere. There is no intracranial mass, hemorrhage, extra-axial fluid collection, or midline shift. Gray-white compartments are normal. The bony calvarium appears intact. There is right maxillary and bilateral ethmoid sinus disease.  CT CERVICAL SPINE FINDINGS  Note that there is moderate motion artifact. There is no apparent fracture. There is slight anterolisthesis of C2 on C3, stable. There is 4 mm of anterolisthesis of C3 on C4 which is new but is felt to be due to underlying spondylosis. No other spondylolisthesis is appreciable. Prevertebral soft tissues and predental space regions are normal.  There is moderate disc space narrowing at C3-4 and C4-5. There is moderately severe disc space narrowing at C5-6 and C6-7. There is facet osteoarthritic change at multiple levels. Exit foraminal narrowing appears most marked at C3-4 on the left, C4-5 bilaterally, C5-6 on the left, and at C6-7 bilaterally.  There is extensive pneumonitis in the visualized right upper lobe.  IMPRESSION: CT head: Prior right temporal lobe infarct. Atrophy with small vessel disease. There is some encephalomalacia on the right with enlargement of the right lateral ventricle compared to the left, stable. No acute appearing infarct is seen. No hemorrhage or  mass effect. There is paranasal sinus disease.  CT cervical spine: Extensive  osteoarthritic change. No fracture. New mild anterolisthesis of C3 on C4, most likely due to underlying spondylosis. If there is concern for ligamentous injury in this area, it may be reasonable to correlate with lateral flexion and extension views to further evaluate.  There is extensive right upper lobe airspace consolidation. Given the clinical history, this finding must be viewed with concern for potential aspiration.     Dg Chest Portable 1 View  07/26/2013   CLINICAL DATA:  Altered mental status  EXAM: PORTABLE CHEST - 1 VIEW  COMPARISON:  05/19/2013  FINDINGS: Extensive airspace disease within the right upper lobe. Hazy airspace disease in the right lower lung zone. Left lung is clear. Normal heart size. No pneumothorax. Chronic right rib deformities.  IMPRESSION: Right lung airspace disease.      ASSESSMENT AND PLAN Wyatt Salas is a 63 y.o. male AA Male with a history of DM-Type 2, CVA, SDH, Gonorrhea, HCV, seizure DO, HTN, HLD and GERD who was admitted on 07/26/13 for encephalopathy.   Mild elevation of troponins now normal. Peaked at 1.12  probably secondary to tachycardia and pneumonia/sepsis. Doubt primary cardiac event.   Hypertension/tachycardia -  Continue lopressor   ECHO reveals EF 55-60% and grade 1 diastolic dysfunction  Encephalopathy- LP unremarkable and neuro believes this was secondary to seizure activity: on ativan, phenytoin, tegretol and keppra. His home seizure medication levels were found to be low and he admits to missing doses. He likely had a seizure with post ictal state causing aspiration PNA. He was unconscious on floor for unknown amount of time causing possible rhabdomyolysis. Mild elevation of troponin likely passive secondary to tachycardia and pneumonia/sepsis. Doubt primary cardiac event.    Tyrell Antonio PA-C  Pager [phone] Agree with above assessment and plan.  His electrocardiogram is reviewed and shows no evidence of ischemia.  Rhythm is  normal sinus rhythm.  His echocardiogram shows good left ventricular systolic function and grade 1 diastolic dysfunction.  The patient is making a good recovery from his encephalopathy.  No further cardiac tests indicated at this time.  We'll sign off.

## 2013-07-28 NOTE — Progress Notes (Signed)
[facility] Progress Note [place] TEAM 1 - Stepdown/ICU TEAM   Wyatt Salas OZD:[id number] DOB: 08/23/1951 DOA: 07/26/2013 PCP: No primary provider on file.  Brief narrative: 63 y/o male with PMHx of occassional marijuna use, DM-Type 2, CVA, SDH, Gonorrhea, HCV, seizure disorder, HTN, HLD and GERD who was in his usual state of health, reportedly ambulatory with walker and able to do ADL and conversing, found at home (independant living) confused and on the floor, having bowel and bladder incontinence. He continued to be confused in the ER.  Work up included an LP that was negative for meningitis - mild amount of RBC noted.  Subjective: Alert and jocular. Denies pain. States is hungry. Endorses primarily wheelchair-bound. Wants to know what we can do for the sores on his feet. 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  Assessment/Plan:  Altered mental status / acute encephalopathy - possibly due to post ictal state along with infection - neuro following - EEG with mild slowing on the left and moderate to severe slowing right was pronounced in the right temporal region without active epileptiform activity noted. - CT head without abnormalities  Seizure disorder - unable to take oral Carbamazepine during first 24 hours after admission due to altered mentation - cont Keppra and Dilantin - f/u on Neuro recommendations  Mild abnormal troponin - appreciate Cardiology f/u - suspect due to mild rhabdomyolysis  Aspiration pneumonitis - discontinue Clindamycin - not hypoxic and no pneumonitis symptoms  Rhabdomyolysis - Suspect combination of suspected recurrent seizures at home and patient being found down on the floor for unknown period of time -CK was 2989 at admission - repeat in a.m. -Continue IV fluid hydration for now  DM2 (diabetes mellitus, type 2) -cont sliding scale sliding scale -began carbohydrate modified  diet  Hyperlipidemia -  Resume home medications  Pressure ulcers bilateral 1st MTP joint -Patient states primarily mobile using wheelchair -Asked OT to evaluate for possible lower extremity orthotic device but given the location of patient's pressure ulcers he needs outpatient evaluation for orthotic shoes or specialty fitted device -Patient states he is a veteran but unclear if he has established himself within the [place] system  Code Status: FULL Family Communication: none Disposition Plan: Transfer to floor - return to independent living when medically stable  Consultants: Neuro Cardiology  Procedures: 1/7- LP 1/8- EEG  Antibiotics: Clindamycin 1/7 >> 1/9  DVT prophylaxis: SCDs  Objective: Blood pressure 147/88, pulse 94, temperature 99.3 F (37.4 C), temperature source Oral, resp. rate 16, height 6\' 1"  (1.854 m), weight 168 lb 10.4 oz (76.5 kg), SpO2 100.00%.  Intake/Output Summary (Last 24 hours) at 07/28/13 1424 Last data filed at 07/28/13 1101  Gross per 24 hour  Intake 3036.67 ml  Output   1500 ml  Net 1536.67 ml   Exam: General: No acute respiratory distress - alert and conversant  Lungs: Clear to auscultation bilaterally without wheezes or crackles Cardiovascular: Regular rate and rhythm without murmur gallop or rub normal S1 and S2 Abdomen: Nontender, nondistended, soft, bowel sounds positive, no rebound, no ascites, no appreciable mass Extremities: No significant cyanosis, clubbing, or edema bilateral lower extremities-no try circumferential non-stage IV pressure ulcers bilateral first/GT MTP joints-no associated erythema or drainage  Data Reviewed: Basic Metabolic Panel:  Recent Labs Lab 07/26/13 1716 07/27/13 0300 07/28/13 0315  NA 142 142 141  K 3.7 5.0 4.3  CL 105 104 104  CO2 25 22 17*  GLUCOSE 122*  168* 92  BUN 7 5* 5*  CREATININE 0.65 0.45* 0.49*  CALCIUM 9.1 9.3 8.8   Liver Function Tests:  Recent Labs Lab 07/26/13 1716 07/27/13 0300   AST 95* 106*  ALT 52 55*  ALKPHOS 61 64  BILITOT 0.6 0.6  PROT 6.8 7.2  ALBUMIN 3.2* 3.4*   CBC:  Recent Labs Lab 07/26/13 1716 07/26/13 2019 07/27/13 0300 07/28/13 0315  WBC SPECIMEN CLOTTED 21.3* 20.2* 15.2*  NEUTROABS  --  19.3*  --   --   HGB 12.8* 11.9* 14.1 12.4*  HCT 37.9* 34.9* 41.8 37.7*  MCV 88.3 86.6 87.8 88.5  PLT 176 196 153 151   Cardiac Enzymes:  Recent Labs Lab 07/26/13 1716 07/26/13 1719 07/27/13 0020 07/27/13 0300 07/27/13 0615 07/27/13 1220  CKTOTAL 2989*  --   --  2553*  --   --   TROPONINI  --  1.12* 0.42*  --  0.36* <0.30   CBG:  Recent Labs Lab 07/27/13 1547 07/27/13 2055 07/27/13 2327 07/28/13 0338 07/28/13 0754  GLUCAP 115* 118* 125* 105* 102*    Recent Results (from the past 240 hour(s))  MRSA PCR SCREENING     Status: None   Collection Time    07/27/13  2:29 AM      Result Value Range Status   MRSA by PCR NEGATIVE  NEGATIVE Final   Comment:            The GeneXpert MRSA Assay (FDA     approved for NASAL specimens     only), is one component of a     comprehensive MRSA colonization     surveillance program. It is not     intended to diagnose MRSA     infection nor to guide or     monitor treatment for     MRSA infections.     Studies:  Recent x-ray studies have been reviewed in detail by the Attending Physician  Scheduled Meds:  Scheduled Meds: . antiseptic oral rinse  15 mL Mouth Rinse q12n4p  . carbamazepine  200 mg Oral BID  . chlorhexidine  15 mL Mouth Rinse BID  . clindamycin (CLEOCIN) IV  600 mg Intravenous Q8H  . insulin aspart  0-9 Units Subcutaneous Q4H  . levETIRAcetam  1,000 mg Oral BID  . LORazepam  1 mg Intravenous Once  . metoprolol  2.5 mg Intravenous Q6H  . multivitamin with minerals  1 tablet Oral Daily  . phenytoin  100 mg Oral BID  . sodium chloride  3 mL Intravenous Q12H    Time spent on care of this patient: 35 min  ELLIS,ALLISON L., ANP  [facility] Office   [phone] Pager - Text Page per Shea Evans as per below:  On-Call/Text Page:      [email or web address]      password [email or web address]  If 7PM-7AM, please contact night-coverage [email or web address] Password [email or web address] 07/28/2013, 2:24 PM   LOS: 2 days   I have personally examined this patient and reviewed the entire database. I have reviewed the above note, made any necessary editorial changes, and agree with its content.  Cherene Altes, MD [facility]

## 2013-07-28 NOTE — Progress Notes (Signed)
Report called pt transferring to 4N02 via w/c with belongings. Notified daugther of new room.

## 2013-07-28 NOTE — Progress Notes (Signed)
NEURO HOSPITALIST PROGRESS NOTE   SUBJECTIVE:                                                                                                                        Patient feels better. He is alert and oriented x 3. Able to communicate his needs and talk reasonably with his family over the phone.  Follows simple and 3 steps commands. Nursing staff reports that his mental status has improved comparing to yesterday. No seizure activity noted overnight.   OBJECTIVE:                                                                                                                           Vital signs in last 24 hours: Temp:  [97.8 F (36.6 C)-99.5 F (37.5 C)] 98.5 F (36.9 C) (01/09 0757) Pulse Rate:  [93-102] 93 (01/09 0757) Resp:  [14-19] 14 (01/09 0757) BP: (127-150)/(78-95) 145/95 mmHg (01/09 0757) SpO2:  [97 %-100 %] 97 % (01/09 0757)  Intake/Output from previous day: 01/08 0701 - 01/09 0700 In: 2485 [I.V.:2075; IV Piggyback:410] Out: 1500 [Urine:1500] Intake/Output this shift: Total I/O In: 100 [I.V.:100] Out: 750 [Urine:750] Nutritional status: Carb Control  Past Medical History  Diagnosis Date  . Traumatic brain injury   . Diabetes mellitus without complication   . Seizures      Neurologic Exam:  Mental Status:  Patient is alert, oriented x 3 Able to answer simple questions and follow simple commands.  No signs of aphasia or neglect   Cranial Nerves:  II: blinks to threat bilaterally Pupils are equal, round, and reactive to light.  III,IV, VI: EOMI without ptosis or diploplia.  V: Facial sensation is symmetric.  VII: Facial movement is mildly diminished on left.  VIII: hearing intact bilaterally  X: Uvula elevates symmetrically  XI: Shoulder shrug is symmetric.  XII: tongue is midline without atrophy or fasciculations.  Motor:  Tone is normal. Bulk is normal. 5/5 strength was present on the right, residual weakness on  left. Follows commands to give thumbs up bilaterally.  Sensory:  respnods to stimuli bilaterally  Deep Tendon Reflexes:  2+ and symmetric in the biceps and patellae.  Cerebellar:  Right sided WNL. unable to perform on left sided  due to weakness. .  Gait:  Not tested   Lab Results: Lab Results  Component Value Date/Time   CHOL  Value: 102        ATP III CLASSIFICATION:  <200     mg/dL   Desirable  200-239  mg/dL   Borderline High  >=240    mg/dL   High        04/21/2010  4:12 AM   Lipid Panel No results found for this basename: CHOL, TRIG, HDL, CHOLHDL, VLDL, LDLCALC,  in the last 72 hours  Studies/Results: Ct Cervical Spine Wo Contrast  07/26/2013   CLINICAL DATA:  Altered mental status and neck pain  EXAM: CT HEAD WITHOUT CONTRAST  CT CERVICAL SPINE WITHOUT CONTRAST  TECHNIQUE: Multidetector CT imaging of the head and cervical spine was performed following the standard protocol without intravenous contrast. Multiplanar CT image reconstructions of the cervical spine were also generated.  COMPARISON:  CT HEAD W/O CM dated 05/19/2013; CT HEAD W/O CM dated 05/31/2009; CT cervical spine May 31, 2009  FINDINGS: CT HEAD FINDINGS  There is mild diffuse atrophy. There is a degree of ex vacuo phenomenon in the right lateral ventricle due to encephalomalacia, stable.  There is evidence of a prior right temporal lobe infarct, primarily involving the posterior aspect of the right temporal lobe, a stable finding. There is no acute appearing infarct present. There is small vessel disease in the centra semiovale bilaterally, more on the right anteriorly than elsewhere. There is no intracranial mass, hemorrhage, extra-axial fluid collection, or midline shift. Gray-white compartments are normal. The bony calvarium appears intact. There is right maxillary and bilateral ethmoid sinus disease.  CT CERVICAL SPINE FINDINGS  Note that there is moderate motion artifact. There is no apparent fracture. There is slight  anterolisthesis of C2 on C3, stable. There is 4 mm of anterolisthesis of C3 on C4 which is new but is felt to be due to underlying spondylosis. No other spondylolisthesis is appreciable. Prevertebral soft tissues and predental space regions are normal.  There is moderate disc space narrowing at C3-4 and C4-5. There is moderately severe disc space narrowing at C5-6 and C6-7. There is facet osteoarthritic change at multiple levels. Exit foraminal narrowing appears most marked at C3-4 on the left, C4-5 bilaterally, C5-6 on the left, and at C6-7 bilaterally.  There is extensive pneumonitis in the visualized right upper lobe.  IMPRESSION: CT head: Prior right temporal lobe infarct. Atrophy with small vessel disease. There is some encephalomalacia on the right with enlargement of the right lateral ventricle compared to the left, stable. No acute appearing infarct is seen. No hemorrhage or mass effect. There is paranasal sinus disease.  CT cervical spine: Extensive osteoarthritic change. No fracture. New mild anterolisthesis of C3 on C4, most likely due to underlying spondylosis. If there is concern for ligamentous injury in this area, it may be reasonable to correlate with lateral flexion and extension views to further evaluate.  There is extensive right upper lobe airspace consolidation. Given the clinical history, this finding must be viewed with concern for potential aspiration.   Electronically Signed   By: Lowella Grip M.D.   On: 07/26/2013 19:16   Dg Chest Portable 1 View  07/26/2013   CLINICAL DATA:  Altered mental status  EXAM: PORTABLE CHEST - 1 VIEW  COMPARISON:  05/19/2013  FINDINGS: Extensive airspace disease within the right upper lobe. Hazy airspace disease in the right lower lung zone. Left lung is clear. Normal heart size.  No pneumothorax. Chronic right rib deformities.  IMPRESSION: Right lung airspace disease.   Electronically Signed   By: Maryclare Bean M.D.   On: 07/26/2013 18:00    MEDICATIONS                                                                                                                        I have reviewed the patient's current medications.  ASSESSMENT/PLAN:                                                                                                            Wyatt Salas is an 63 y.o. 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 The neurology is consulted for the evaluation of acute encephalopathy, and possible seizure activity since Tegretal and Dilantin level were both subtherapeutic.  LP- non revealing Head CT--negative for acute abnormality EEG--"abnormal with asymmetric continuous slowing of cerebral activity, with mild slowing of cerebral activity on the left and moderately severe slowing on the right, most pronounced involving right temporal region. No epileptiform activity was demonstrated"  # Acute encephalopathy, better today      Patient with h/o right SDH and subsequent seizure disorder presented with Aspiration PNA, Rhabdomyolysis, elevated Troponin and acute encephalopathy. Neurology is consulted for the evaluation of encephalopathy.        Given his clinical presentation, he could have had seizure activity with post ictal confusion in the setting of sub therapeutic AED levels, and his aspiration PNA and Rhabdo could all relate to his seizure activity and post ictal. NSTEMI is less likely in the setting of significant elevated CK and Myolysis though he had mild elevated tropoinin which normalized now. The fact that he is getting better correlates with above discussion.    Plan - Seizure precaution - AED:  Dilantin 100 mg po BID and Keppra 1000 BID Increase Tegretol to 200 mg morning and night - will  hold off MRI since he is better.  - will continue to follow up.    Assessment and plan discussed with with attending physician and they are in agreement. No further neurological intervention is indicated at this point. Recommend patient followup with outpatient neurologist following discharge from acute care stay.  Charlann Lange, MD PGY-3  [facility]  [phone] pager  CR Prudencio Burly.D.  [facility] [phone]  07/28/2013, 10:53 AM

## 2013-07-29 LAB — BASIC METABOLIC PANEL WITH GFR
BUN: 7 mg/dL (ref 6–23)
CO2: 22 meq/L (ref 19–32)
Calcium: 9.1 mg/dL (ref 8.4–10.5)
Chloride: 104 meq/L (ref 96–112)
Creatinine, Ser: 0.57 mg/dL (ref 0.50–1.35)
GFR calc Af Amer: >90 mL/min
GFR calc non Af Amer: >90 mL/min
Glucose, Bld: 133 mg/dL — ABNORMAL HIGH (ref 70–99)
Potassium: 3.3 meq/L — ABNORMAL LOW (ref 3.7–5.3)
Sodium: 141 meq/L (ref 137–147)

## 2013-07-29 LAB — CBC
HCT: 36.5 % — ABNORMAL LOW (ref 39.0–52.0)
Hemoglobin: 12 g/dL — ABNORMAL LOW (ref 13.0–17.0)
MCH: 28.7 pg (ref 26.0–34.0)
MCHC: 32.9 g/dL (ref 30.0–36.0)
MCV: 87.3 fL (ref 78.0–100.0)
Platelets: 144 10*3/uL — ABNORMAL LOW (ref 150–400)
RBC: 4.18 MIL/uL — ABNORMAL LOW (ref 4.22–5.81)
RDW: 13.1 % (ref 11.5–15.5)
WBC: 10.6 10*3/uL — ABNORMAL HIGH (ref 4.0–10.5)

## 2013-07-29 LAB — GLUCOSE, CAPILLARY
GLUCOSE-CAPILLARY: 158 mg/dL — AB (ref 70–99)
GLUCOSE-CAPILLARY: 149 mg/dL — AB (ref 70–99)
Glucose-Capillary: 144 mg/dL — ABNORMAL HIGH (ref 70–99)
Glucose-Capillary: 144 mg/dL — ABNORMAL HIGH (ref 70–99)

## 2013-07-29 LAB — CK: Total CK: 564 U/L — ABNORMAL HIGH (ref 7–232)

## 2013-07-29 MED ORDER — POTASSIUM CHLORIDE CRYS ER 20 MEQ PO TBCR
40.0000 meq | EXTENDED_RELEASE_TABLET | Freq: Two times a day (BID) | ORAL | Status: AC
  Administered 2013-07-29 (×2): 40 meq via ORAL
  Filled 2013-07-29 (×2): qty 2

## 2013-07-29 MED ORDER — ACETAMINOPHEN 325 MG PO TABS
650.0000 mg | ORAL_TABLET | ORAL | Status: DC | PRN
  Administered 2013-07-29: 650 mg via ORAL

## 2013-07-29 MED ORDER — IBUPROFEN 400 MG PO TABS
400.0000 mg | ORAL_TABLET | Freq: Four times a day (QID) | ORAL | Status: DC | PRN
  Filled 2013-07-29 (×2): qty 1

## 2013-07-29 NOTE — Progress Notes (Signed)
[facility] Progress Note    Wyatt Salas WJX:[id number] DOB: 08/23/1951 DOA: 07/26/2013 PCP: No primary provider on file.  Brief narrative: 63 y/o male with PMHx of occassional marijuna use, DM-Type 2, CVA, SDH, Gonorrhea, HCV, seizure disorder, HTN, HLD and GERD who was in his usual state of health, reportedly ambulatory with walker and able to do ADL and conversing, found at home (independant living) confused and on the floor, having bowel and bladder incontinence. He continued to be confused in the ER.  Work up included an LP that was negative for meningitis - mild amount of RBC noted.  Subjective: Comfortable.   Assessment/Plan:  Altered mental status / acute encephalopathy - possibly due to post ictal state along with infection - neuro following - EEG with mild slowing on the left and moderate to severe slowing right was pronounced in the right temporal region without active epileptiform activity noted. - CT head without abnormalities  Seizure disorder - unable to take oral Carbamazepine during first 24 hours after admission due to altered mentation - cont Keppra and Dilantin - f/u on Neuro recommendations  Mild abnormal troponin - appreciate Cardiology f/u - suspect due to mild rhabdomyolysis  Aspiration pneumonitis - discontinue Clindamycin - not hypoxic and no pneumonitis symptoms  Rhabdomyolysis - Suspect combination of suspected recurrent seizures at home and patient being found down on the floor for unknown period of time -CK was 2989 at admission - repeat in a.m. -Continue IV fluid hydration for now  DM2 (diabetes mellitus, type 2) -cont sliding scale sliding scale -began carbohydrate modified diet - CBG (last 3)   Recent Labs  07/28/13 1609 07/28/13 2115 07/29/13 0636  GLUCAP 171* 297* 144*      Hyperlipidemia -  Resume home medications  Pressure ulcers bilateral 1st MTP joint -Patient states primarily mobile using wheelchair -Asked OT  to evaluate for possible lower extremity orthotic device but given the location of patient's pressure ulcers he needs outpatient evaluation for orthotic shoes or specialty fitted device -Patient states he is a veteran but unclear if he has established himself within the [place] system  Code Status: FULL Family Communication: none Disposition Plan:pending PT eval.   Consultants: Neuro Cardiology  Procedures: 1/7- LP 1/8- EEG  Antibiotics: Clindamycin 1/7 >> 1/9  DVT prophylaxis: SCDs  Objective: Blood pressure 126/68, pulse 67, temperature 98.2 F (36.8 C), temperature source Oral, resp. rate 18, height 6\' 1"  (1.854 m), weight 76.5 kg (168 lb 10.4 oz), SpO2 100.00%.  Intake/Output Summary (Last 24 hours) at 07/29/13 1516 Last data filed at 07/29/13 0856  Gross per 24 hour  Intake    960 ml  Output    900 ml  Net     60 ml   Exam: General: No acute respiratory distress - alert and conversant  Lungs: Clear to auscultation bilaterally without wheezes or crackles Cardiovascular: Regular rate and rhythm without murmur gallop or rub normal S1 and S2 Abdomen: Nontender, nondistended, soft, bowel sounds positive, no rebound, no ascites, no appreciable mass Extremities: No significant cyanosis, clubbing, or edema bilateral lower extremities-no try circumferential non-stage IV pressure ulcers bilateral first/GT MTP joints-no associated erythema or drainage  Data Reviewed: Basic Metabolic Panel:  Recent Labs Lab 07/26/13 1716 07/27/13 0300 07/28/13 0315 07/29/13 0540  NA 142 142 141 141  K 3.7 5.0 4.3 3.3*  CL 105 104 104 104  CO2 25 22 17* 22  GLUCOSE 122* 168* 92 133*  BUN 7 5* 5* 7  CREATININE 0.65 0.45*  0.49* 0.57  CALCIUM 9.1 9.3 8.8 9.1   Liver Function Tests:  Recent Labs Lab 07/26/13 1716 07/27/13 0300  AST 95* 106*  ALT 52 55*  ALKPHOS 61 64  BILITOT 0.6 0.6  PROT 6.8 7.2  ALBUMIN 3.2* 3.4*   CBC:  Recent Labs Lab 07/26/13 1716 07/26/13 2019  07/27/13 0300 07/28/13 0315 07/29/13 0540  WBC SPECIMEN CLOTTED 21.3* 20.2* 15.2* 10.6*  NEUTROABS  --  19.3*  --   --   --   HGB 12.8* 11.9* 14.1 12.4* 12.0*  HCT 37.9* 34.9* 41.8 37.7* 36.5*  MCV 88.3 86.6 87.8 88.5 87.3  PLT 176 196 153 151 144*   Cardiac Enzymes:  Recent Labs Lab 07/26/13 1716 07/26/13 1719 07/27/13 0020 07/27/13 0300 07/27/13 0615 07/27/13 1220 07/29/13 0540  CKTOTAL 2989*  --   --  2553*  --   --  564*  TROPONINI  --  1.12* 0.42*  --  0.36* <0.30  --    CBG:  Recent Labs Lab 07/28/13 0754 07/28/13 1206 07/28/13 1609 07/28/13 2115 07/29/13 0636  GLUCAP 102* 104* 171* 297* 144*    Recent Results (from the past 240 hour(s))  MRSA PCR SCREENING     Status: None   Collection Time    07/27/13  2:29 AM      Result Value Range Status   MRSA by PCR NEGATIVE  NEGATIVE Final   Comment:            The GeneXpert MRSA Assay (FDA     approved for NASAL specimens     only), is one component of a     comprehensive MRSA colonization     surveillance program. It is not     intended to diagnose MRSA     infection nor to guide or     monitor treatment for     MRSA infections.     Studies:  Recent x-ray studies have been reviewed in detail by the Attending Physician  Scheduled Meds:  Scheduled Meds: . antiseptic oral rinse  15 mL Mouth Rinse q12n4p  . atorvastatin  40 mg Oral q1800  . carbamazepine  200 mg Oral BID  . chlorhexidine  15 mL Mouth Rinse BID  . docusate sodium  200 mg Oral BID  . galantamine  8 mg Oral BID WC  . insulin aspart  0-9 Units Subcutaneous TID WC  . levETIRAcetam  1,000 mg Oral BID  . multivitamin with minerals  1 tablet Oral Daily  . phenytoin  100 mg Oral BID  . potassium chloride  40 mEq Oral BID  . sodium chloride  3 mL Intravenous Q12H    Time spent on care of this patient: 35 min  Aideliz Garmany,  [phone] [facility] Office  [phone] Pager - Text Page per Shea Evans as per below:  On-Call/Text  Page:      [email or web address]      password [email or web address]  If 7PM-7AM, please contact night-coverage [email or web address] Password [email or web address] 07/29/2013, 3:16 PM

## 2013-07-30 LAB — BASIC METABOLIC PANEL
BUN: 6 mg/dL (ref 6–23)
CHLORIDE: 103 meq/L (ref 96–112)
CO2: 26 mEq/L (ref 19–32)
CREATININE: 0.59 mg/dL (ref 0.50–1.35)
Calcium: 8.9 mg/dL (ref 8.4–10.5)
GFR calc non Af Amer: >90 mL/min (ref >90)
Glucose, Bld: 116 mg/dL — ABNORMAL HIGH (ref 70–99)
POTASSIUM: 3.5 meq/L — AB (ref 3.7–5.3)
Sodium: 140 mEq/L (ref 137–147)

## 2013-07-30 LAB — GLUCOSE, CAPILLARY
Glucose-Capillary: 127 mg/dL — ABNORMAL HIGH (ref 70–99)
Glucose-Capillary: 151 mg/dL — ABNORMAL HIGH (ref 70–99)
Glucose-Capillary: 119 mg/dL — ABNORMAL HIGH (ref 70–99)
Glucose-Capillary: 140 mg/dL — ABNORMAL HIGH (ref 70–99)

## 2013-07-30 MED ORDER — POTASSIUM CHLORIDE CRYS ER 20 MEQ PO TBCR
40.0000 meq | EXTENDED_RELEASE_TABLET | Freq: Two times a day (BID) | ORAL | Status: AC
  Administered 2013-07-30 (×2): 40 meq via ORAL
  Filled 2013-07-30 (×2): qty 2

## 2013-07-30 MED ORDER — MUPIROCIN CALCIUM 2 % EX CREA
TOPICAL_CREAM | Freq: Two times a day (BID) | CUTANEOUS | Status: DC
  Administered 2013-07-30: 1 via TOPICAL
  Administered 2013-07-30 – 2013-07-31 (×2): via TOPICAL
  Filled 2013-07-30: qty 15

## 2013-07-30 NOTE — Consult Note (Addendum)
WOC wound consult note Reason for Consult: Consult requested for bilateral foot wounds.  Pt states they have been present for 2 months; he has home health assistance for dressing changes and he thinks they have greatly improved from previous status. Wound type: full thickness wounds to left and right anterior foot areas Measurement: Left foot .5X.5X.1cm. Right foot .6X.6X.1cm Wound bed: Both sites with dry red wound bed  Drainage (amount, consistency, odor) no odor or drainage. Periwound: Intact to surrounding skin Dressing procedure/placement/frequency: Bactroban to promote moist healing.  Foam dressing to protect from further injury. Please re-consult if further assistance is needed.  Thank-you,  Julien Girt MSN, [place], [place], [place], [place]

## 2013-07-30 NOTE — Evaluation (Signed)
Physical Therapy Evaluation Patient Details Name: Wyatt Salas MRN: [id number] DOB: 08/23/1951 Today's Date: 07/30/2013 Time: 0037-0488 PT Time Calculation (min): 30 min  PT Assessment / Plan / Recommendation History of Present Illness  63 y/o male with PMHx of occassional marijuna use, DM-Type 2, CVA, SDH, Gonorrhea, HCV, seizure disorder, HTN, HLD and GERD who was in his usual state of health, reportedly ambulatory with walker and able to do ADL and conversing, found at home (independant living) confused and on the floor, having bowel and bladder incontinence. He continued to be confused in the ER.  Work up included an LP that was negative for meningitis - mild amount of RBC noted.  Clinical Impression  Pt admitted with above. Pt currently with functional limitations due to the deficits listed below (see PT Problem List).  Pt will benefit from skilled PT to increase their independence and safety with mobility to allow discharge to the venue listed below.       PT Assessment  Patient needs continued PT services    Follow Up Recommendations  Home health PT    Does the patient have the potential to tolerate intense rehabilitation      Barriers to Discharge        Equipment Recommendations  Rolling walker with 5" wheels (Will consider RW)    Recommendations for Other Services OT consult   Frequency Min 3X/week    Precautions / Restrictions Precautions Precautions: Fall   Pertinent Vitals/Pain no apparent distress       Mobility  Bed Mobility Overal bed mobility: Independent Transfers Overall transfer level: Needs assistance Transfers: Sit to/from Stand;Stand Pivot Transfers Sit to Stand: Min guard Stand pivot transfers: Min guard General transfer comment: Cues for technique; good UE reach for opposite armrest Ambulation/Gait General Gait Details: Not tested as pt became dizzy with first bout of standing and had to sit    Exercises     PT Diagnosis: Difficulty  walking;Generalized weakness  PT Problem List: Decreased strength;Decreased activity tolerance;Decreased balance;Decreased mobility;Decreased coordination;Decreased knowledge of precautions;Decreased knowledge of use of DME PT Treatment Interventions: DME instruction;Gait training;Functional mobility training;Therapeutic activities;Therapeutic exercise;Patient/family education;Balance training;Neuromuscular re-education     PT Goals(Current goals can be found in the care plan section) Acute Rehab PT Goals Patient Stated Goal: Home PT Goal Formulation: With patient Time For Goal Achievement: 08/06/13 Potential to Achieve Goals: Good  Visit Information  Last PT Received On: 07/30/13 Assistance Needed: +1 History of Present Illness: 63 y/o male with PMHx of occassional marijuna use, DM-Type 2, CVA, SDH, Gonorrhea, HCV, seizure disorder, HTN, HLD and GERD who was in his usual state of health, reportedly ambulatory with walker and able to do ADL and conversing, found at home (independant living) confused and on the floor, having bowel and bladder incontinence. He continued to be confused in the ER.  Work up included an LP that was negative for meningitis - mild amount of RBC noted.       Prior [place] expects to be discharged to:: Assisted living Living Arrangements: Alone (independent living apt) Home Equipment: Cane - quad;Shower seat - built in;Hand held shower head Prior Function Level of Independence: Independent with assistive device(s) Communication Communication: No difficulties    Cognition  Cognition Arousal/Alertness: Awake/alert Behavior During Therapy: WFL for tasks assessed/performed Overall Cognitive Status: Within Functional Limits for tasks assessed    Extremity/Trunk Assessment Upper Extremity Assessment Upper Extremity Assessment: Defer to OT evaluation Lower Extremity Assessment Lower Extremity Assessment: Generalized weakness  Balance Balance Overall balance assessment: Needs assistance Sitting-balance support: Bilateral upper extremity supported Sitting balance-Leahy Scale: Good Standing balance support: Single extremity supported Standing balance-Leahy Scale: Fair Standing balance comment: Had to sit upon initial stand, likely related to effects of bedrest General Comments General comments (skin integrity, edema, etc.): Applied dressing to wound at L MT head  End of Session PT - End of Session Activity Tolerance: Patient tolerated treatment well Patient left: in chair;with call bell/phone within reach;Other (comment) (in camera room) Nurse Communication: Mobility status;Other (comment) (Need for chair pad for chair alarm)  GP     Roney Marion [facility] [place], [place]  07/30/2013, 4:10 PM

## 2013-07-30 NOTE — Progress Notes (Signed)
[facility] Progress Note    Wyatt Salas ZOX:[id number] DOB: 08/23/1951 DOA: 07/26/2013 PCP: No primary provider on file.  Brief narrative: 63 y/o male with PMHx of occassional marijuna use, DM-Type 2, CVA, SDH, Gonorrhea, HCV, seizure disorder, HTN, HLD and GERD who was in his usual state of health, reportedly ambulatory with walker and able to do ADL and conversing, found at home (independant living) confused and on the floor, having bowel and bladder incontinence. He continued to be confused in the ER.  Work up included an LP that was negative for meningitis - mild amount of RBC noted.  Subjective: Comfortable. Concerned about his ulcers in the foot.   Assessment/Plan:  Altered mental status / acute encephalopathy - resolved.  - possibly due to post ictal state along with infection - neuro following - EEG with mild slowing on the left and moderate to severe slowing right was pronounced in the right temporal region without active epileptiform activity noted. - CT head without abnormalities  Seizure disorder - unable to take oral Carbamazepine during first 24 hours after admission due to altered mentation - cont Keppra and Dilantin - f/u on Neuro recommendations  Mild abnormal troponin - appreciate Cardiology f/u - suspect due to mild rhabdomyolysis  Aspiration pneumonitis - discontinue Clindamycin - not hypoxic and no pneumonitis symptoms  Rhabdomyolysis - Suspect combination of suspected recurrent seizures at home and patient being found down on the floor for unknown period of time -CK was 2989 at admission - repeat in a.m shows improvement to 564. Marland Kitchen   DM2 (diabetes mellitus, type 2) -cont sliding scale sliding scale -began carbohydrate modified diet - CBG (last 3)   Recent Labs  07/29/13 1610 07/29/13 2109 07/30/13 0637  GLUCAP 144* 149* 127*     CBG (last 3)   Recent Labs  07/29/13 1610 07/29/13 2109 07/30/13 0637  GLUCAP 144* 149* 127*       Hyperlipidemia -  Resume home medications   Hypokalemia:  replete as needed.     Pressure ulcers bilateral 1st MTP joint -Patient states primarily mobile using wheelchair -Asked OT to evaluate for possible lower extremity orthotic device but given the location of patient's pressure ulcers he needs outpatient evaluation for orthotic shoes or specialty fitted device -Patient states he is a veteran but unclear if he has established himself within the [place] system  Code Status: FULL Family Communication: none Disposition Plan:pending PT eval.   Consultants: Neuro Cardiology  Procedures: 1/7- LP 1/8- EEG  Antibiotics: Clindamycin 1/7 >> 1/9  DVT prophylaxis: SCDs  Objective: Blood pressure 128/73, pulse 92, temperature 99.1 F (37.3 C), temperature source Oral, resp. rate 20, height 6\' 1"  (1.854 m), weight 76.5 kg (168 lb 10.4 oz), SpO2 96.00%.  Intake/Output Summary (Last 24 hours) at 07/30/13 1136 Last data filed at 07/30/13 1004  Gross per 24 hour  Intake    360 ml  Output   1700 ml  Net  -1340 ml   Exam: General: No acute respiratory distress - alert and conversant  Lungs: Clear to auscultation bilaterally without wheezes or crackles Cardiovascular: Regular rate and rhythm without murmur gallop or rub normal S1 and S2 Abdomen: Nontender, nondistended, soft, bowel sounds positive, no rebound, no ascites, no appreciable mass Extremities: No significant cyanosis, clubbing, or edema bilateral lower extremities-no try circumferential non-stage IV pressure ulcers bilateral first/GT MTP joints-no associated erythema or drainage  Data Reviewed: Basic Metabolic Panel:  Recent Labs Lab 07/26/13 1716 07/27/13 0300 07/28/13 0315 07/29/13 0540 07/30/13  0756  NA 142 142 141 141 140  K 3.7 5.0 4.3 3.3* 3.5*  CL 105 104 104 104 103  CO2 25 22 17* 22 26  GLUCOSE 122* 168* 92 133* 116*  BUN 7 5* 5* 7 6  CREATININE 0.65 0.45* 0.49* 0.57 0.59  CALCIUM 9.1 9.3 8.8  9.1 8.9   Liver Function Tests:  Recent Labs Lab 07/26/13 1716 07/27/13 0300  AST 95* 106*  ALT 52 55*  ALKPHOS 61 64  BILITOT 0.6 0.6  PROT 6.8 7.2  ALBUMIN 3.2* 3.4*   CBC:  Recent Labs Lab 07/26/13 1716 07/26/13 2019 07/27/13 0300 07/28/13 0315 07/29/13 0540  WBC SPECIMEN CLOTTED 21.3* 20.2* 15.2* 10.6*  NEUTROABS  --  19.3*  --   --   --   HGB 12.8* 11.9* 14.1 12.4* 12.0*  HCT 37.9* 34.9* 41.8 37.7* 36.5*  MCV 88.3 86.6 87.8 88.5 87.3  PLT 176 196 153 151 144*   Cardiac Enzymes:  Recent Labs Lab 07/26/13 1716 07/26/13 1719 07/27/13 0020 07/27/13 0300 07/27/13 0615 07/27/13 1220 07/29/13 0540  CKTOTAL 2989*  --   --  2553*  --   --  564*  TROPONINI  --  1.12* 0.42*  --  0.36* <0.30  --    CBG:  Recent Labs Lab 07/29/13 0636 07/29/13 1111 07/29/13 1610 07/29/13 2109 07/30/13 0637  GLUCAP 144* 158* 144* 149* 127*    Recent Results (from the past 240 hour(s))  MRSA PCR SCREENING     Status: None   Collection Time    07/27/13  2:29 AM      Result Value Range Status   MRSA by PCR NEGATIVE  NEGATIVE Final   Comment:            The GeneXpert MRSA Assay (FDA     approved for NASAL specimens     only), is one component of a     comprehensive MRSA colonization     surveillance program. It is not     intended to diagnose MRSA     infection nor to guide or     monitor treatment for     MRSA infections.     Studies:  Recent x-ray studies have been reviewed in detail by the Attending Physician  Scheduled Meds:  Scheduled Meds: . antiseptic oral rinse  15 mL Mouth Rinse q12n4p  . atorvastatin  40 mg Oral q1800  . carbamazepine  200 mg Oral BID  . chlorhexidine  15 mL Mouth Rinse BID  . docusate sodium  200 mg Oral BID  . galantamine  8 mg Oral BID WC  . insulin aspart  0-9 Units Subcutaneous TID WC  . levETIRAcetam  1,000 mg Oral BID  . multivitamin with minerals  1 tablet Oral Daily  . mupirocin cream   Topical BID  . phenytoin  100 mg  Oral BID  . potassium chloride  40 mEq Oral BID  . sodium chloride  3 mL Intravenous Q12H    Time spent on care of this patient: 35 min  Damien Batty,  [phone] [facility] Office  [phone] Pager - Text Page per Shea Evans as per below:  On-Call/Text Page:      [email or web address]      password [email or web address]  If 7PM-7AM, please contact night-coverage [email or web address] Password [email or web address] 07/30/2013, 11:36 AM

## 2013-07-31 LAB — GLUCOSE, CAPILLARY
GLUCOSE-CAPILLARY: 163 mg/dL — AB (ref 70–99)
Glucose-Capillary: 151 mg/dL — ABNORMAL HIGH (ref 70–99)

## 2013-07-31 MED ORDER — CARBAMAZEPINE 200 MG PO TABS: 200.0000 mg | ORAL_TABLET | Freq: Two times a day (BID) | ORAL | Status: DC

## 2013-07-31 MED ORDER — GALANTAMINE HYDROBROMIDE 8 MG PO TABS: 8.0000 mg | ORAL_TABLET | Freq: Two times a day (BID) | ORAL | Status: AC

## 2013-07-31 NOTE — Care Management Note (Signed)
    Page 1 of 1   07/31/2013     10:42:51 AM   CARE MANAGEMENT NOTE 07/31/2013  Patient:  Wyatt Salas, Wyatt Salas   Account Number:  [id number]  Date Initiated:  07/27/2013  Documentation initiated by:  Marvetta Gibbons  Subjective/Objective Assessment:   Pt admitted with AMS, aspiration PNA, mild rhabdo, elevated troponins, ?Sz     Action/Plan:   PTA pt lived at home ([place] apartments)- NCM to follow progression for d/c needs   Anticipated DC Date:  07/31/2013   Anticipated DC Plan:        [place]  CM consult      Choice offered to / List presented to:  C-1 Patient        [place] arranged  HH-1 RN  [place] OT      [facility] agency  [facility]   Status of service:  Completed, signed off Medicare Important Message given?   (If response is "NO", the following Medicare IM given date fields will be blank) Date Medicare IM given:   Date Additional Medicare IM given:    Discharge Disposition:  [place]  Per UR Regulation:  Reviewed for med. necessity/level of care/duration of stay  If discussed at [place] of Stay Meetings, dates discussed:    Comments:  07/31/13 [place], MSN, CM- Met with patient to discuss home health needs.  Patient was previously getting a HHRN for medication management and wound care. Message was sent to Dr Karleen Hampshire asking for a resumption order for RN in addition to her order for [facility] PT/OT.  Patient is declining a rolling walker at this time.  Patient requested information on payment plans for his hospital bill. Patient was provided with the number for Financial Assistance [phone]. Patient's RN updated.

## 2013-07-31 NOTE — Progress Notes (Signed)
Physical Therapy Treatment Patient Details Name: Wyatt Salas MRN: [id number] DOB: 08/23/1951 Today's Date: 07/31/2013 Time: 1517-6160 PT Time Calculation (min): 28 min  PT Assessment / Plan / Recommendation  History of Present Illness 63 y/o male with PMHx of occassional marijuna use, DM-Type 2, CVA, SDH, Gonorrhea, HCV, seizure disorder, HTN, HLD and GERD who was in his usual state of health, reportedly ambulatory with walker and able to do ADL and conversing, found at home (independant living) confused and on the floor, having bowel and bladder incontinence. He continued to be confused in the ER.  Work up included an LP that was negative for meningitis - mild amount of RBC noted.   PT Comments   Pt moves slowly and benefits from use of RW as opposed to quad cane which pt states he uses at home.  Pt indicates he has a w/c he uses frequently and at times for around the house.  Will continue to follow.    Follow Up Recommendations  Home health PT     Does the patient have the potential to tolerate intense rehabilitation     Barriers to Discharge        Equipment Recommendations  Rolling walker with 5" wheels    Recommendations for Other Services    Frequency Min 3X/week   Progress towards PT Goals Progress towards PT goals: Progressing toward goals  Plan Current plan remains appropriate    Precautions / Restrictions Precautions Precautions: Fall Restrictions Weight Bearing Restrictions: No   Pertinent Vitals/Pain Denied pain.      Mobility  Bed Mobility Overal bed mobility: Independent Transfers Overall transfer level: Needs assistance Equipment used: Rolling walker (2 wheeled) Transfers: Sit to/from Stand Sit to Stand: Min guard Stand pivot transfers: Min guard General transfer comment: cues for UE use.  pt tends to lean posteriorly and has LEs rested against bed for support.   Ambulation/Gait Ambulation/Gait assistance: Min guard Ambulation Distance (Feet): 40  Feet Assistive device: Rolling walker (2 wheeled) Gait Pattern/deviations: Step-through pattern;Decreased stride length;Trunk flexed General Gait Details: pt moves slowly and indicates he only ambulates short distances at home.      Exercises     PT Diagnosis:    PT Problem List:   PT Treatment Interventions:     PT Goals (current goals can now be found in the care plan section) Acute Rehab PT Goals Patient Stated Goal: Home Time For Goal Achievement: 08/06/13 Potential to Achieve Goals: Good  Visit Information  Last PT Received On: 07/31/13 Assistance Needed: +1 History of Present Illness: 63 y/o male with PMHx of occassional marijuna use, DM-Type 2, CVA, SDH, Gonorrhea, HCV, seizure disorder, HTN, HLD and GERD who was in his usual state of health, reportedly ambulatory with walker and able to do ADL and conversing, found at home (independant living) confused and on the floor, having bowel and bladder incontinence. He continued to be confused in the ER.  Work up included an LP that was negative for meningitis - mild amount of RBC noted.    Subjective Data  Patient Stated Goal: Home   Cognition  Cognition Arousal/Alertness: Awake/alert Behavior During Therapy: WFL for tasks assessed/performed Overall Cognitive Status: Within Functional Limits for tasks assessed    Balance  Balance Overall balance assessment: Needs assistance Standing balance support: Bilateral upper extremity supported Standing balance-Leahy Scale: Good Standing balance comment: pt needs Bil UE support for safety.    End of Session PT - End of Session Equipment Utilized During Treatment: Gait belt  Activity Tolerance: Patient tolerated treatment well Patient left: in chair;with call bell/phone within reach Nurse Communication: Mobility status   GP     Catarina Hartshorn, [place] 07/31/2013, 8:56 AM

## 2013-07-31 NOTE — Discharge Summary (Signed)
Physician Discharge Summary  Wyatt Salas PFX:[id number] DOB: 08/23/1951 DOA: 07/26/2013  PCP: No primary provider on file.  Admit date: 07/26/2013 Discharge date: 07/31/2013  Time spent: 30 minutes  Recommendations for Outpatient Follow-up:  1. Follow up with PCP in one week 2. Follow up with Neurology in 2 weeks.   Discharge Diagnoses:  Principal Problem:   Altered mental status Active Problems:   Seizure disorder   NSTEMI (non-ST elevated myocardial infarction)   DM2 (diabetes mellitus, type 2)   Hyperlipidemia   Aspiration pneumonia   Rhabdomyolysis   Discharge Condition: improved.   Diet recommendation: carb modified diet  Filed Weights   07/26/13 2000 07/27/13 0007  Weight: 105.2 kg (231 lb 14.8 oz) 76.5 kg (168 lb 10.4 oz)    History of present illness:  Wyatt Salas is an 63 y.o. male with hx PMHx of occassional marijuna use, DM-Type 2, CVA, SDH, Gonorrhea, HCV, seizure DO, HTN, HLD and GERD, was in his usual state of health, reportedly ambulatory with walker and able to do ADL and conversing, found at home dis-shoveled and confused on the floor, having bowel and bladder incontinence. In the ER, he was not responding at the beginning, confused, and later was able to answer some direct questions. He was agitated and was given Ativan. Work up included a head CT which showed no acute process, an EKG showed SR, with LVH, and troponin of 1.12. His Tegretal and Dilantin level were both subtherapeutic. He has a marked leukocytosis with WBC of 21K, with elevated CK to 3000, and Cr of 0.65. Cardiology was consulted, did not feel his elevation of troponin was from an ACS, and recommended IV heparin when safe to do so, along with ASA, and to further obtain troponin and ECHO. I consulted Dr Leonel Ramsay as well, and he performed an LP along with supplementing his ACD as the levels were low. His CXR was suspicious for aspiration PNA, so Clindamycin was given. Hospitalist was asked to  admit him for further evaluation and Tx of altered mental status, supicious for seizure, with possible NSTEMI, and aspiration PNA   Hospital Course:  Altered mental status / acute encephalopathy  - resolved.  - possibly due to post ictal state along with infection  - neuro following  - EEG with mild slowing on the left and moderate to severe slowing right was pronounced in the right temporal region without active epileptiform activity noted.  - CT head without abnormalities  Seizure disorder  - unable to take oral Carbamazepine during first 24 hours after admission due to altered mentation  - cont Keppra and Dilantin  - f/u on Neuro recommendations as outpatient. Recommend outpatient follow up in 2 weeks.   Mild abnormal troponin  - appreciate Cardiology f/u  - suspect due to mild rhabdomyolysis .  - the troponins have trended down and he denies any chest pain or sob.  Aspiration pneumonitis  - discontinue Clindamycin - not hypoxic and no pneumonitis symptoms  Rhabdomyolysis  - Suspect combination of suspected recurrent seizures at home and patient being found down on the floor for unknown period of time  -CK was 2989 at admission - repeat in a.m shows improvement to 564.    DM2 (diabetes mellitus, type 2)  Continue with metformin.   -began carbohydrate modified diet  -  CBG (last 3)   Recent Labs   07/29/13 1610  07/29/13 2109  07/30/13 0637   GLUCAP  144*  149*  127*  Hyperlipidemia  - Resume home medications   Hypokalemia:  repleted as needed.  Pressure ulcers bilateral 1st MTP joint  -Patient states primarily mobile using wheelchair  -Asked OT to evaluate for possible lower extremity orthotic device but given the location of patient's pressure ulcers he needs outpatient evaluation for orthotic shoes or specialty fitted device . 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   Procedures: 1/7-  LP  1/8- EEG  Consultations:  Neurology  cardiology  Discharge Exam: Filed Vitals:   07/31/13 0605  BP: 136/89  Pulse: 91  Temp: 98.9 F (37.2 C)  Resp: 18   General: No acute respiratory distress - alert and conversant  Lungs: Clear to auscultation bilaterally without wheezes or crackles  Cardiovascular: Regular rate and rhythm without murmur gallop or rub normal S1 and S2  Abdomen: Nontender, nondistended, soft, bowel sounds positive, no rebound, no ascites, no appreciable mass  Extremities: No significant cyanosis, clubbing, or edema bilateral lower extremities-no try circumferential non-stage IV pressure ulcers bilateral first/GT MTP joints-no associated erythema or drainage    Discharge Instructions  Discharge Orders   Future Orders Complete By Expires   Discharge instructions  As directed    Comments:     Follow up with neurology as recommended.       Medication List    STOP taking these medications       galantamine 16 MG 24 hr capsule  Commonly known as:  RAZADYNE ER  Replaced by:  galantamine 8 MG tablet      TAKE these medications       acetaminophen 500 MG tablet  Commonly known as:  TYLENOL  Take 500 mg by mouth 2 (two) times daily.     carbamazepine 200 MG tablet  Commonly known as:  TEGRETOL  Take 1 tablet (200 mg total) by mouth 2 (two) times daily.     docusate sodium 100 MG capsule  Commonly known as:  COLACE  Take 200 mg by mouth 2 (two) times daily.     galantamine 8 MG tablet  Commonly known as:  RAZADYNE  Take 1 tablet (8 mg total) by mouth 2 (two) times daily with a meal.     levETIRAcetam 500 MG tablet  Commonly known as:  KEPPRA  Take 1,000 mg by mouth every 12 (twelve) hours.     metFORMIN 500 MG tablet  Commonly known as:  GLUCOPHAGE  Take 500 mg by mouth daily with breakfast.     multivitamin with minerals Tabs tablet  Take 1 tablet by mouth daily.     naproxen 500 MG tablet  Commonly known as:  NAPROSYN  Take 500  mg by mouth 2 (two) times daily with a meal.     phenytoin 100 MG ER capsule  Commonly known as:  DILANTIN  Take 100 mg by mouth 2 (two) times daily.     simvastatin 80 MG tablet  Commonly known as:  ZOCOR  Take 40 mg by mouth at bedtime.       No Known Allergies    The results of significant diagnostics from this hospitalization (including imaging, microbiology, ancillary and laboratory) are listed below for reference.    Significant Diagnostic Studies: Ct Head Wo Contrast (only If Suspected Head Trauma And/or Pt Is On Anticoagulant)  07/28/2013   CLINICAL DATA:  Altered mental status and neck pain  EXAM: CT HEAD WITHOUT CONTRAST  CT CERVICAL SPINE WITHOUT CONTRAST  TECHNIQUE: Multidetector CT imaging of the head and cervical spine was performed following the standard protocol without intravenous contrast. Multiplanar CT image reconstructions of the cervical spine were also generated.  COMPARISON:  CT HEAD W/O CM dated 05/19/2013; CT HEAD W/O CM dated 05/31/2009; CT cervical spine May 31, 2009  FINDINGS: CT HEAD FINDINGS  There is mild diffuse atrophy. There is a degree of ex vacuo phenomenon in the right lateral ventricle due to encephalomalacia, stable.  There is evidence of a prior right temporal lobe infarct, primarily involving the posterior aspect of the right temporal lobe, a stable finding. There is no acute appearing infarct present. There is small vessel disease in the centra semiovale bilaterally, more on the right anteriorly than elsewhere. There is no intracranial mass, hemorrhage, extra-axial fluid collection, or midline shift. Gray-white compartments are normal. The bony calvarium appears intact. There is right maxillary and bilateral ethmoid sinus disease.  CT CERVICAL SPINE FINDINGS  Note that there is moderate motion artifact. There is no apparent fracture. There is slight anterolisthesis of C2 on C3, stable. There is 4 mm of anterolisthesis of C3 on C4 which is new but is  felt to be due to underlying spondylosis. No other spondylolisthesis is appreciable. Prevertebral soft tissues and predental space regions are normal.  There is moderate disc space narrowing at C3-4 and C4-5. There is moderately severe disc space narrowing at C5-6 and C6-7. There is facet osteoarthritic change at multiple levels. Exit foraminal narrowing appears most marked at C3-4 on the left, C4-5 bilaterally, C5-6 on the left, and at C6-7 bilaterally.  There is extensive pneumonitis in the visualized right upper lobe.  IMPRESSION: CT head: Prior right temporal lobe infarct. Atrophy with small vessel disease. There is some encephalomalacia on the right with enlargement of the right lateral ventricle compared to the left, stable. No acute appearing infarct is seen. No hemorrhage or mass effect. There is paranasal sinus disease.  CT cervical spine: Extensive osteoarthritic change. No fracture. New mild anterolisthesis of C3 on C4, most likely due to underlying spondylosis. If there is concern for ligamentous injury in this area, it may be reasonable to correlate with lateral flexion and extension views to further evaluate.  There is extensive right upper lobe airspace consolidation. Given the clinical history, this finding must be viewed with concern for potential aspiration.   Electronically Signed   By: Lowella Grip M.D.   On: 07/28/2013 11:01   Ct Cervical Spine Wo Contrast  07/26/2013   CLINICAL DATA:  Altered mental status and neck pain  EXAM: CT HEAD WITHOUT CONTRAST  CT CERVICAL SPINE WITHOUT CONTRAST  TECHNIQUE: Multidetector CT imaging of the head and cervical spine was performed following the standard protocol without intravenous contrast. Multiplanar CT image reconstructions of the cervical spine were also generated.  COMPARISON:  CT HEAD W/O CM dated 05/19/2013; CT HEAD W/O CM dated 05/31/2009; CT cervical spine May 31, 2009  FINDINGS: CT HEAD FINDINGS  There is mild diffuse atrophy. There is  a degree of ex vacuo phenomenon in the right lateral ventricle due to encephalomalacia, stable.  There is evidence of a prior right temporal lobe infarct, primarily involving the posterior aspect of the right temporal lobe, a stable finding. There is no acute appearing infarct present. There is small vessel disease in the centra semiovale bilaterally, more on the right anteriorly than elsewhere. There is no intracranial mass, hemorrhage, extra-axial fluid collection, or midline shift. Gray-white compartments are normal. The bony calvarium appears intact.  There is right maxillary and bilateral ethmoid sinus disease.  CT CERVICAL SPINE FINDINGS  Note that there is moderate motion artifact. There is no apparent fracture. There is slight anterolisthesis of C2 on C3, stable. There is 4 mm of anterolisthesis of C3 on C4 which is new but is felt to be due to underlying spondylosis. No other spondylolisthesis is appreciable. Prevertebral soft tissues and predental space regions are normal.  There is moderate disc space narrowing at C3-4 and C4-5. There is moderately severe disc space narrowing at C5-6 and C6-7. There is facet osteoarthritic change at multiple levels. Exit foraminal narrowing appears most marked at C3-4 on the left, C4-5 bilaterally, C5-6 on the left, and at C6-7 bilaterally.  There is extensive pneumonitis in the visualized right upper lobe.  IMPRESSION: CT head: Prior right temporal lobe infarct. Atrophy with small vessel disease. There is some encephalomalacia on the right with enlargement of the right lateral ventricle compared to the left, stable. No acute appearing infarct is seen. No hemorrhage or mass effect. There is paranasal sinus disease.  CT cervical spine: Extensive osteoarthritic change. No fracture. New mild anterolisthesis of C3 on C4, most likely due to underlying spondylosis. If there is concern for ligamentous injury in this area, it may be reasonable to correlate with lateral flexion and  extension views to further evaluate.  There is extensive right upper lobe airspace consolidation. Given the clinical history, this finding must be viewed with concern for potential aspiration.   Electronically Signed   By: Lowella Grip M.D.   On: 07/26/2013 19:16   Dg Chest Portable 1 View  07/26/2013   CLINICAL DATA:  Altered mental status  EXAM: PORTABLE CHEST - 1 VIEW  COMPARISON:  05/19/2013  FINDINGS: Extensive airspace disease within the right upper lobe. Hazy airspace disease in the right lower lung zone. Left lung is clear. Normal heart size. No pneumothorax. Chronic right rib deformities.  IMPRESSION: Right lung airspace disease.   Electronically Signed   By: Maryclare Bean M.D.   On: 07/26/2013 18:00    Microbiology: Recent Results (from the past 240 hour(s))  MRSA PCR SCREENING     Status: None   Collection Time    07/27/13  2:29 AM      Result Value Range Status   MRSA by PCR NEGATIVE  NEGATIVE Final   Comment:            The GeneXpert MRSA Assay (FDA     approved for NASAL specimens     only), is one component of a     comprehensive MRSA colonization     surveillance program. It is not     intended to diagnose MRSA     infection nor to guide or     monitor treatment for     MRSA infections.     Labs: Basic Metabolic Panel:  Recent Labs Lab 07/26/13 1716 07/27/13 0300 07/28/13 0315 07/29/13 0540 07/30/13 0756  NA 142 142 141 141 140  K 3.7 5.0 4.3 3.3* 3.5*  CL 105 104 104 104 103  CO2 25 22 17* 22 26  GLUCOSE 122* 168* 92 133* 116*  BUN 7 5* 5* 7 6  CREATININE 0.65 0.45* 0.49* 0.57 0.59  CALCIUM 9.1 9.3 8.8 9.1 8.9   Liver Function Tests:  Recent Labs Lab 07/26/13 1716 07/27/13 0300  AST 95* 106*  ALT 52 55*  ALKPHOS 61 64  BILITOT 0.6 0.6  PROT 6.8 7.2  ALBUMIN 3.2* 3.4*  No results found for this basename: LIPASE, AMYLASE,  in the last 168 hours No results found for this basename: AMMONIA,  in the last 168 hours CBC:  Recent Labs Lab  07/26/13 1716 07/26/13 2019 07/27/13 0300 07/28/13 0315 07/29/13 0540  WBC SPECIMEN CLOTTED 21.3* 20.2* 15.2* 10.6*  NEUTROABS  --  19.3*  --   --   --   HGB 12.8* 11.9* 14.1 12.4* 12.0*  HCT 37.9* 34.9* 41.8 37.7* 36.5*  MCV 88.3 86.6 87.8 88.5 87.3  PLT 176 196 153 151 144*   Cardiac Enzymes:  Recent Labs Lab 07/26/13 1716 07/26/13 1719 07/27/13 0020 07/27/13 0300 07/27/13 0615 07/27/13 1220 07/29/13 0540  CKTOTAL 2989*  --   --  2553*  --   --  564*  TROPONINI  --  1.12* 0.42*  --  0.36* <0.30  --    BNP: BNP (last 3 results) No results found for this basename: PROBNP,  in the last 8760 hours CBG:  Recent Labs Lab 07/30/13 0637 07/30/13 1202 07/30/13 1652 07/30/13 2204 07/31/13 0634  GLUCAP 127* 151* 119* 140* 163*       Signed:  Taeja Debellis  [facility] 07/31/2013, 9:29 AM

## 2013-07-31 NOTE — Progress Notes (Signed)
UR complete.  Khoi Hamberger RN, MSN 

## 2013-07-31 NOTE — Progress Notes (Signed)
Patient discharge education and paperwork given to patient as well as prescriptions.  IV removed and patient driven home by Ivin Booty, his friend.  Kerri Perches

## 2014-01-17 DIAGNOSIS — M6282 Rhabdomyolysis: Secondary | ICD-10-CM

## 2014-01-17 DIAGNOSIS — R55 Syncope and collapse: Secondary | ICD-10-CM

## 2014-01-17 HISTORY — DX: Rhabdomyolysis: M62.82

## 2014-01-17 HISTORY — DX: Syncope and collapse: R55

## 2014-01-25 ENCOUNTER — Inpatient Hospital Stay (HOSPITAL_COMMUNITY): Payer: Medicare Other

## 2014-01-25 ENCOUNTER — Emergency Department (HOSPITAL_COMMUNITY): Payer: Medicare Other

## 2014-01-25 ENCOUNTER — Inpatient Hospital Stay (HOSPITAL_COMMUNITY)
Admission: EM | Admit: 2014-01-25 | Discharge: 2014-01-26 | DRG: 312 | Disposition: A | Discharge status: Home / Self Care | Payer: Medicare Other | Attending: Internal Medicine | Admitting: Internal Medicine

## 2014-01-25 ENCOUNTER — Encounter (HOSPITAL_COMMUNITY): Payer: Self-pay | Admitting: Emergency Medicine

## 2014-01-25 DIAGNOSIS — R748 Abnormal levels of other serum enzymes: Secondary | ICD-10-CM | POA: Diagnosis present

## 2014-01-25 DIAGNOSIS — I10 Essential (primary) hypertension: Secondary | ICD-10-CM | POA: Diagnosis present

## 2014-01-25 DIAGNOSIS — B1920 Unspecified viral hepatitis C without hepatic coma: Secondary | ICD-10-CM | POA: Diagnosis present

## 2014-01-25 DIAGNOSIS — F1010 Alcohol abuse, uncomplicated: Secondary | ICD-10-CM | POA: Diagnosis present

## 2014-01-25 DIAGNOSIS — I252 Old myocardial infarction: Secondary | ICD-10-CM

## 2014-01-25 DIAGNOSIS — E870 Hyperosmolality and hypernatremia: Secondary | ICD-10-CM

## 2014-01-25 DIAGNOSIS — E860 Dehydration: Secondary | ICD-10-CM | POA: Diagnosis present

## 2014-01-25 DIAGNOSIS — E785 Hyperlipidemia, unspecified: Secondary | ICD-10-CM | POA: Diagnosis present

## 2014-01-25 DIAGNOSIS — Y92009 Unspecified place in unspecified non-institutional (private) residence as the place of occurrence of the external cause: Secondary | ICD-10-CM

## 2014-01-25 DIAGNOSIS — W19XXXA Unspecified fall, initial encounter: Secondary | ICD-10-CM

## 2014-01-25 DIAGNOSIS — W19XXXD Unspecified fall, subsequent encounter: Secondary | ICD-10-CM

## 2014-01-25 DIAGNOSIS — F17200 Nicotine dependence, unspecified, uncomplicated: Secondary | ICD-10-CM | POA: Diagnosis present

## 2014-01-25 DIAGNOSIS — R4182 Altered mental status, unspecified: Secondary | ICD-10-CM

## 2014-01-25 DIAGNOSIS — G40909 Epilepsy, unspecified, not intractable, without status epilepticus: Secondary | ICD-10-CM | POA: Diagnosis present

## 2014-01-25 DIAGNOSIS — F1210 Cannabis abuse, uncomplicated: Secondary | ICD-10-CM | POA: Diagnosis present

## 2014-01-25 DIAGNOSIS — R55 Syncope and collapse: Principal | ICD-10-CM

## 2014-01-25 DIAGNOSIS — E119 Type 2 diabetes mellitus without complications: Secondary | ICD-10-CM | POA: Diagnosis present

## 2014-01-25 HISTORY — DX: Syncope and collapse: R55

## 2014-01-25 HISTORY — DX: Non-ST elevation (NSTEMI) myocardial infarction: I21.4

## 2014-01-25 HISTORY — DX: Rhabdomyolysis: M62.82

## 2014-01-25 LAB — COMPREHENSIVE METABOLIC PANEL
ALBUMIN: 4.3 g/dL (ref 3.5–5.2)
ALK PHOS: 82 U/L (ref 39–117)
ALT: 119 U/L — AB (ref 0–53)
ANION GAP: 15 (ref 5–15)
AST: 109 U/L — AB (ref 0–37)
BILIRUBIN TOTAL: 0.3 mg/dL (ref 0.3–1.2)
BUN: 20 mg/dL (ref 6–23)
CHLORIDE: 113 meq/L — AB (ref 96–112)
CO2: 24 mEq/L (ref 19–32)
Calcium: 9.7 mg/dL (ref 8.4–10.5)
Creatinine, Ser: 0.77 mg/dL (ref 0.50–1.35)
GFR calc Af Amer: >90 mL/min (ref >90)
GFR calc non Af Amer: >90 mL/min (ref >90)
Glucose, Bld: 115 mg/dL — ABNORMAL HIGH (ref 70–99)
POTASSIUM: 3.5 meq/L — AB (ref 3.7–5.3)
SODIUM: 152 meq/L — AB (ref 137–147)
TOTAL PROTEIN: 8.1 g/dL (ref 6.0–8.3)

## 2014-01-25 LAB — I-STAT TROPONIN, ED: Troponin i, poc: 0.02 ng/mL (ref 0.00–0.08)

## 2014-01-25 LAB — DIFFERENTIAL
BASOS ABS: 0 10*3/uL (ref 0.0–0.1)
BASOS PCT: 0 % (ref 0–1)
EOS ABS: 0.2 10*3/uL (ref 0.0–0.7)
Eosinophils Relative: 2 % (ref 0–5)
Lymphocytes Relative: 39 % (ref 12–46)
Lymphs Abs: 2.9 10*3/uL (ref 0.7–4.0)
Monocytes Absolute: 0.6 10*3/uL (ref 0.1–1.0)
Monocytes Relative: 8 % (ref 3–12)
NEUTROS ABS: 3.7 10*3/uL (ref 1.7–7.7)
NEUTROS PCT: 51 % (ref 43–77)

## 2014-01-25 LAB — CBC
HCT: 42.9 % (ref 39.0–52.0)
Hemoglobin: 13.6 g/dL (ref 13.0–17.0)
MCH: 29.1 pg (ref 26.0–34.0)
MCHC: 31.7 g/dL (ref 30.0–36.0)
MCV: 91.7 fL (ref 78.0–100.0)
PLATELETS: 167 10*3/uL (ref 150–400)
RBC: 4.68 MIL/uL (ref 4.22–5.81)
RDW: 13.9 % (ref 11.5–15.5)
WBC: 7.4 10*3/uL (ref 4.0–10.5)

## 2014-01-25 LAB — ETHANOL: Alcohol, Ethyl (B): <11 mg/dL (ref 0–11)

## 2014-01-25 LAB — I-STAT CHEM 8, ED
BUN: 18 mg/dL (ref 6–23)
CREATININE: 0.8 mg/dL (ref 0.50–1.35)
Calcium, Ion: 1.18 mmol/L (ref 1.13–1.30)
Chloride: 118 mEq/L — ABNORMAL HIGH (ref 96–112)
Glucose, Bld: 117 mg/dL — ABNORMAL HIGH (ref 70–99)
HCT: 45 % (ref 39.0–52.0)
HEMOGLOBIN: 15.3 g/dL (ref 13.0–17.0)
POTASSIUM: 3.3 meq/L — AB (ref 3.7–5.3)
SODIUM: 150 meq/L — AB (ref 137–147)
TCO2: 23 mmol/L (ref 0–100)

## 2014-01-25 LAB — URINALYSIS, ROUTINE W REFLEX MICROSCOPIC
BILIRUBIN URINE: NEGATIVE
Glucose, UA: NEGATIVE mg/dL
Hgb urine dipstick: NEGATIVE
KETONES UR: 15 mg/dL — AB
Nitrite: NEGATIVE
PH: 5.5 (ref 5.0–8.0)
Protein, ur: 100 mg/dL — AB
Specific Gravity, Urine: 1.029 (ref 1.005–1.030)
UROBILINOGEN UA: 1 mg/dL (ref 0.0–1.0)

## 2014-01-25 LAB — URINE MICROSCOPIC-ADD ON

## 2014-01-25 LAB — APTT: aPTT: 25 seconds (ref 24–37)

## 2014-01-25 LAB — TROPONIN I: Troponin I: <0.3 ng/mL (ref <0.30)

## 2014-01-25 LAB — PROTIME-INR
INR: 1.11 (ref 0.00–1.49)
PROTHROMBIN TIME: 14.3 s (ref 11.6–15.2)

## 2014-01-25 LAB — RAPID URINE DRUG SCREEN, HOSP PERFORMED
AMPHETAMINES: NOT DETECTED
BARBITURATES: NOT DETECTED
Benzodiazepines: NOT DETECTED
Cocaine: NOT DETECTED
Opiates: NOT DETECTED
Tetrahydrocannabinol: NOT DETECTED

## 2014-01-25 LAB — CK: Total CK: 756 U/L — ABNORMAL HIGH (ref 7–232)

## 2014-01-25 LAB — PHENYTOIN LEVEL, TOTAL: PHENYTOIN LVL: 3.2 ug/mL — AB (ref 10.0–20.0)

## 2014-01-25 LAB — CBG MONITORING, ED: Glucose-Capillary: 124 mg/dL — ABNORMAL HIGH (ref 70–99)

## 2014-01-25 LAB — CARBAMAZEPINE LEVEL, TOTAL: Carbamazepine Lvl: 2.9 ug/mL — ABNORMAL LOW (ref 4.0–12.0)

## 2014-01-25 MED ORDER — ACETAMINOPHEN 650 MG RE SUPP: 650.0000 mg | Freq: Four times a day (QID) | RECTAL | Status: DC | PRN

## 2014-01-25 MED ORDER — PHENYTOIN SODIUM 50 MG/ML IJ SOLN
500.0000 mg | Freq: Once | INTRAMUSCULAR | Status: AC
  Administered 2014-01-25: 500 mg via INTRAVENOUS
  Filled 2014-01-25: qty 10

## 2014-01-25 MED ORDER — DOCUSATE SODIUM 100 MG PO CAPS
200.0000 mg | ORAL_CAPSULE | Freq: Two times a day (BID) | ORAL | Status: DC
  Administered 2014-01-25 – 2014-01-26 (×2): 200 mg via ORAL
  Filled 2014-01-25 (×3): qty 2

## 2014-01-25 MED ORDER — SODIUM CHLORIDE 0.9 % IV SOLN
INTRAVENOUS | Status: DC
  Administered 2014-01-25: 18:00:00 via INTRAVENOUS

## 2014-01-25 MED ORDER — SODIUM CHLORIDE 0.9 % IV SOLN
INTRAVENOUS | Status: DC
  Administered 2014-01-25: 75 mL/h via INTRAVENOUS

## 2014-01-25 MED ORDER — POTASSIUM CHLORIDE CRYS ER 20 MEQ PO TBCR
40.0000 meq | EXTENDED_RELEASE_TABLET | Freq: Once | ORAL | Status: AC
  Administered 2014-01-25: 40 meq via ORAL
  Filled 2014-01-25: qty 2

## 2014-01-25 MED ORDER — PHENYTOIN SODIUM EXTENDED 100 MG PO CAPS
100.0000 mg | ORAL_CAPSULE | Freq: Two times a day (BID) | ORAL | Status: DC
  Administered 2014-01-26: 100 mg via ORAL
  Filled 2014-01-25 (×2): qty 1

## 2014-01-25 MED ORDER — LEVETIRACETAM 500 MG PO TABS: 500.0000 mg | ORAL_TABLET | Freq: Two times a day (BID) | ORAL | Status: DC

## 2014-01-25 MED ORDER — CARBAMAZEPINE 200 MG PO TABS
300.0000 mg | ORAL_TABLET | Freq: Two times a day (BID) | ORAL | Status: DC
  Administered 2014-01-25 – 2014-01-26 (×2): 300 mg via ORAL
  Filled 2014-01-25 (×3): qty 1.5

## 2014-01-25 MED ORDER — INSULIN ASPART 100 UNIT/ML ~~LOC~~ SOLN
0.0000 [IU] | Freq: Three times a day (TID) | SUBCUTANEOUS | Status: DC
Start: 2014-01-26 — End: 2014-01-26
  Administered 2014-01-26: 1 [IU] via SUBCUTANEOUS

## 2014-01-25 MED ORDER — ASPIRIN EC 81 MG PO TBEC
81.0000 mg | DELAYED_RELEASE_TABLET | Freq: Every day | ORAL | Status: DC
  Administered 2014-01-25 – 2014-01-26 (×2): 81 mg via ORAL
  Filled 2014-01-25 (×3): qty 1

## 2014-01-25 MED ORDER — ENOXAPARIN SODIUM 40 MG/0.4ML ~~LOC~~ SOLN
40.0000 mg | SUBCUTANEOUS | Status: DC
  Administered 2014-01-25: 40 mg via SUBCUTANEOUS
  Filled 2014-01-25 (×2): qty 0.4

## 2014-01-25 MED ORDER — LEVETIRACETAM IN NACL 1000 MG/100ML IV SOLN
1000.0000 mg | Freq: Two times a day (BID) | INTRAVENOUS | Status: DC
  Administered 2014-01-25 – 2014-01-26 (×2): 1000 mg via INTRAVENOUS
  Filled 2014-01-25 (×3): qty 100

## 2014-01-25 MED ORDER — ACETAMINOPHEN 325 MG PO TABS
650.0000 mg | ORAL_TABLET | Freq: Four times a day (QID) | ORAL | Status: DC | PRN
  Administered 2014-01-25 – 2014-01-26 (×2): 650 mg via ORAL
  Filled 2014-01-25 (×2): qty 2

## 2014-01-25 MED ORDER — SODIUM CHLORIDE 0.9 % IV SOLN
INTRAVENOUS | Status: AC
  Administered 2014-01-26: 06:00:00 via INTRAVENOUS

## 2014-01-25 NOTE — ED Notes (Signed)
Advised need urine; pt in CT at present will obtain when pt comes back

## 2014-01-25 NOTE — H&P (Signed)
Date: 01/25/2014               Patient Name:  Wyatt Salas MRN: [id number]  DOB: 1950/12/22 Age / Sex: 63 y.o., male   PCP: Pcp Not In System         Medical Service: Internal Medicine Teaching Service         Attending Physician: Dr. Karren Cobble, MD    First Contact: Dr. Sherrine Maples Pager: [phone]  Second Contact: Dr. Eula Fried Pager: [phone]       After Hours (After 5p/  First Contact Pager: [phone]  weekends / holidays): Second Contact Pager: [phone]   Chief Complaint: falls  History of Present Illness: Wyatt Salas is a 63 y.o. Male with PMHx of Subdural hematoma s/p R sided craniotomy, DMII, seizure disorder, chronic Hepatitis C, HLD, HTN, chronic diastolic congestive heart failure stage 1 and h/o of NSTEMI who presents to the ED upon request of his friend for fall and altered mental status. Pt states he fell getting out of the shower 2-3 days ago. He says the shower chair was too small for him and he slipped. He denies any loss of consciousness, dizziness, lightheadedness, seizure, chest pain of shortness of breath before or after fall. Pt only complains of headache and Left sided back, hip and leg pain from where he fell getting out of the shower. Pt denies any change in behavior recently, denies any muscle weakness, loss of strength, change in vision or speech. Pt denies any history of stroke. He states his last seizure was several years ago. Pt does admit that he mixed up his morning and evening medications on the day that he fell; however, morning and evening medication doses are the same.. Pt does admit to eating and drinking less fluid recently because his EBT card was cancelled. Pt drinks about 12 oz of beer daily and smokes marijuana. In the ED, patient's friend said that pt has not been cleaning himself lately and will stare off into space.   Meds: Current Facility-Administered Medications  Medication Dose Route Frequency Provider Last Rate Last Dose  . 0.9 %  sodium  chloride infusion   Intravenous Continuous Corky Sox, MD 100 mL/hr at 01/25/14 2143    . acetaminophen (TYLENOL) tablet 650 mg  650 mg Oral Q6H PRN Corky Sox, MD   650 mg at 01/25/14 2239   Or  . acetaminophen (TYLENOL) suppository 650 mg  650 mg Rectal Q6H PRN Corky Sox, MD      . aspirin EC tablet 81 mg  81 mg Oral Daily Corky Sox, MD   81 mg at 01/25/14 2239  . carbamazepine (TEGRETOL) tablet 300 mg  300 mg Oral BID Corky Sox, MD   300 mg at 01/25/14 2145  . docusate sodium (COLACE) capsule 200 mg  200 mg Oral BID Corky Sox, MD   200 mg at 01/25/14 2145  . enoxaparin (LOVENOX) injection 40 mg  40 mg Subcutaneous Q24H Corky Sox, MD   40 mg at 01/25/14 2144  . [START ON 01/26/2014] insulin aspart (novoLOG) injection 0-9 Units  0-9 Units Subcutaneous TID WC Corky Sox, MD      . levETIRAcetam (KEPPRA) IVPB 1000 mg/100 mL premix  1,000 mg Intravenous Q12H Corky Sox, MD   1,000 mg at 01/25/14 2239  . [START ON 01/26/2014] phenytoin (DILANTIN) ER capsule 100 mg  100 mg Oral BID Corky Sox, MD  Allergies: Allergies as of 01/25/2014  . (No Known Allergies)   Past Medical History  Diagnosis Date  . Traumatic brain injury   . Diabetes mellitus without complication   . Seizures   . Diastolic dysfunction     a. ECHO 07/2013 EF 55-60% and grade 1 diastolic dysfunction   History reviewed. No pertinent past surgical history. History reviewed. No pertinent family history. History   Social History  . Marital Status: Divorced    Spouse Name: N/A    Number of Children: N/A  . Years of Education: N/A   Occupational History  . Not on file.   Social History Main Topics  . Smoking status: Current Every Day Smoker    Types: Cigarettes  . Smokeless tobacco: Not on file  . Alcohol Use: Yes     Comment: rare  . Drug Use: No  . Sexual Activity: Not on file   Other Topics Concern  . Not on file   Social History Narrative  . No narrative on file    Review of  Systems: General: Denies fever, chills, diaphoresis, appetite change and fatigue.  Respiratory: Denies SOB, DOE, cough, chest tightness, and wheezing.   Cardiovascular: Denies chest pain and palpitations.  Gastrointestinal: Denies nausea, vomiting, abdominal pain, diarrhea, constipation, blood in stool and abdominal distention.  Genitourinary: Denies dysuria, urgency, frequency, hematuria, and flank pain. Endocrine: Denies hot or cold intolerance, polyuria, and polydipsia. Musculoskeletal: Admits to Left sided myalgias, back pain, and Left leg pain. Denies joint swelling, arthralgias. Left sided arm weakness and Left leg weakness--unchanged from baseline. Skin: Denies pallor, rash and wounds.  Neurological: Admits to headache. Denies dizziness, recent seizures, syncope, weakness, lightheadedness, numbness.Marland Kitchen  Psychiatric/Behavioral: Denies mood changes, confusion, nervousness, sleep disturbance and agitation.  Physical Exam: Filed Vitals:   01/25/14 1700 01/25/14 1801 01/25/14 1844 01/25/14 2100  BP: 112/79  151/85 119/71  Pulse: 72  73 71  Temp:  98.2 F (36.8 C) 97.8 F (36.6 C) 98.3 F (36.8 C)  TempSrc:   Oral Oral  Resp: 12  15 16   Height:   6\' 1"  (1.854 m)   Weight:   164 lb 8 oz (74.617 kg)   SpO2: 100%  100% 100%   General: Vital signs reviewed. Patient is a well-developed and well-nourished, in no acute distress and cooperative with exam.  Head: Normocephalic and atraumatic. Eyes: PERRL, EOMI, conjunctivae normal, No scleral icterus.  Neck: Supple, trachea midline, normal ROM, No JVD, masses, thyromegaly, or no carotid bruit present.  Cardiovascular: RRR, S1 normal, S2 normal, no murmurs, gallops, or rubs. Pulmonary/Chest:CTA bilaterally, no wheezes, rales, or rhonchi. Abdominal: Soft, non-tender, non-distended, BS +, no masses, organomegaly, or guarding present.  Musculoskeletal: No joint deformities, erythema, or stiffness, ROM full and nontender. Extremities: No swelling  or edema,  pulses symmetric and intact bilaterally. No cyanosis or clubbing. Neurological: A&O x3, 3/5 strength in left upper extremity and left lower extremity. 5/5 strength in right upper and lower extremities.  cranial nerve II-XII are grossly intact, sensory intact to light touch bilaterally.  Skin: Dry, chapped lips. Ecchymosis along L flank and L hip. Warm, dry and intact. No rashes or erythema. Psychiatric: Normal mood and affect. speech and behavior is normal. Cognition and memory are normal.   Lab results: Basic Metabolic Panel:  Recent Labs  01/25/14 1314 01/25/14 1345  NA 152* 150*  K 3.5* 3.3*  CL 113* 118*  CO2 24  --   GLUCOSE 115* 117*  BUN 20 18  CREATININE  0.77 0.80  CALCIUM 9.7  --    Liver Function Tests:  Recent Labs  01/25/14 1314  AST 109*  ALT 119*  ALKPHOS 82  BILITOT 0.3  PROT 8.1  ALBUMIN 4.3   CBC:  Recent Labs  01/25/14 1314 01/25/14 1345  WBC 7.4  --   NEUTROABS 3.7  --   HGB 13.6 15.3  HCT 42.9 45.0  MCV 91.7  --   PLT 167  --    Cardiac Enzymes:  Recent Labs  01/25/14 2230  CKTOTAL 756*  TROPONINI <0.30   CBG:  Recent Labs  01/25/14 1429  GLUCAP 124*   Coagulation:  Recent Labs  01/25/14 1314  LABPROT 14.3  INR 1.11   Urine Drug Screen: Drugs of Abuse     Component Value Date/Time   LABOPIA NONE DETECTED 01/25/2014 1700   COCAINSCRNUR NONE DETECTED 01/25/2014 1700   LABBENZ NONE DETECTED 01/25/2014 1700   AMPHETMU NONE DETECTED 01/25/2014 1700   THCU NONE DETECTED 01/25/2014 1700   LABBARB NONE DETECTED 01/25/2014 1700    Alcohol Level:  Recent Labs  01/25/14 1429  ETH <11   Urinalysis:  Recent Labs  01/25/14 1700  COLORURINE AMBER*  LABSPEC 1.029  PHURINE 5.5  GLUCOSEU NEGATIVE  HGBUR NEGATIVE  BILIRUBINUR NEGATIVE  KETONESUR 15*  PROTEINUR 100*  UROBILINOGEN 1.0  NITRITE NEGATIVE  LEUKOCYTESUR TRACE*   Imaging results:  Ct Head Wo Contrast  01/25/2014   CLINICAL DATA:  Status post fall.   Altered mental status.  EXAM: CT HEAD WITHOUT CONTRAST  TECHNIQUE: Contiguous axial images were obtained from the base of the skull through the vertex without intravenous contrast.  COMPARISON:  Head CT scan 07/26/2013 and 05/19/2013.  FINDINGS: Atrophy, chronic microvascular ischemic change and remote right MCA territory infarct are again seen. No evidence of acute abnormality including infarct, hemorrhage, mass lesion, mass effect, midline shift or abnormal extra-axial fluid collection is identified. Right craniotomy defect is again seen. Imaged paranasal sinuses and mastoid air cells are clear.  IMPRESSION: No acute finding.  Stable compared to prior exams.   Electronically Signed   By: Inge Rise M.D.   On: 01/25/2014 16:11   Dg Chest Port 1 View  01/25/2014   CLINICAL DATA:  Altered mental status.  EXAM: PORTABLE CHEST - 1 VIEW  COMPARISON:  Single view of the chest 07/26/2013.  FINDINGS: Right upper lobe airspace disease seen on the comparison study has resolved. Lungs are clear. Heart size is normal. No pneumothorax or pleural effusion.  IMPRESSION: No acute disease.   Electronically Signed   By: Inge Rise M.D.   On: 01/25/2014 14:29    Other results: EKG: normal EKG, normal sinus rhythm, T wave inversions in leads III, aVF, V3, V4, V5, and V6.   Assessment & Plan by Problem:  Fall: Pt presents after falling out of the shower 2-3 days ago. Pt states he slipped because he has a new shower chair. Denies any LOC, vision changes, speech changes, weakness, dizziness, CP, or SOB. Only complaint is left sided back and leg pain. Sister/friend stated to ED that he hasn't been cleaning himself and has been staring into space. Hx of seizures of but says last one was years ago. Carbamazepine and phenytoin levels were low; 2.9 and 3.2 respectively even though pt states he hasn't missed any doses. Pt did mix up his morning and evening medications on the day that he fell; however, morning and evening  medication doses are the same. Pt  follows with Dr. Mel Almond, Neurologist at the [facility] in [place]. Physical exam showed CN II-XII grossly intact. No changes in strength from baseline. Has left sided R upper and lower extremity weakness and walk with a quad cane or in a wheelchair. CT head without contrast showed no acute intracranial abnormality including infarct, hemorrhage, or mass effect. Pt does have R craniotomy defect from previous SDH. CXR shows no acute disease. No cough or shortness of breath. CTA b/l on exam. Urinalysis shows amber colored urine, with trace leukocytes and negative nitrites. Pt admits to dehydration and eating less. Chapped, cracked lips on exam. Pt denies dysuria or frequency. At home, pt takes carbamazepine 300 mg BID, galantamine 8 mg BID, phenytoin 100 mg ER and keppra 500 mg BID. -Repeat EKG in am -Trend troponins -CK for history of rhabdomylosis -Repeat CBC and BMP in am -NS 100 mL/hr IV  -Gave loading dose of dilantin and keppra after discussion with Dr. 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 Pt is AAOx3, CN II-XII grossly intact. CT head w/o contrast negative for acute abnormalities. Pt is appropriate of speech and mannerisms. Pt denies any confusion or behavioral changes.  -Hold galantamine for now.  -Consider calling sister/friend in am for better history  EKG with inferolateral T wave inversions: EKG changes seen that are different on comparison from most recent EKG on 07/28/13. On further study, EKG from October 2014 has similar EKG changes which at the time were attributed to LVH. Pt does have history of NSTEMI. Pt denies CP, SOB. Vital signs are stable HR 71 BP 119/71 RR 16 Pulse ox 100% on room air.  Previous notes indicate that pt was supposed to have outpatient follow up with Dr. Terrence Dupont, which did not happen.  -Trend troponins -On ASA 81 mg daily -Repeat EKG in am -Consider Cardiology consult for outpatient stress test   Hepatitis C: Chronically elevated LFTs AST 109 and ALT 119. Gradual increase from AST 64 and ALT 68 since 2011. Denies history of Hepatitis C.   DM II: On Metformin 500 mg BID at home. Glucose 115 on admission. Last HbA1c 5.8 in 04/21/2010. -SSI-sensitive  HTN: History of HTN. Not currently hypertensive. BP 119/71 currently. Not on any hypertensive medications at home.   Chronic diastolic congestive heart failure stage 1: Last echocardiogram showed mild LVH, EF 62-83%, systolic function normal, abnormal left ventricular relaxation with grade 1 diastolic dysfunction. Statin at home. -On ASA 81 mg daily  -Hold statin for now  HLD: On simvastatin at home. LFT abnormalities. History of rhabdomyolysis and recent fall.  -Hold for now simvastatin   DVT ppx: Lovenox 40 mg daily  Dispo: Disposition is deferred at this time, awaiting improvement of current medical problems. Anticipated discharge in approximately 1-2 day(s).   The patient does not have a current PCP (Pcp Not In System) and does not know need an [facility] hospital follow-up appointment after discharge.  The patient does not have transportation limitations that hinder transportation to clinic appointments.  Signed: Osa Craver, DO PGY-1 Internal Medicine Resident Pager # [phone] 01/25/2014 11:33 PM

## 2014-01-25 NOTE — ED Notes (Signed)
Paged IV team to attempt PIV

## 2014-01-25 NOTE — ED Notes (Signed)
Pt presents with onset of disorientation noted by [facility] on Monday.  Pt reports falling, attempting to get out of shower, she noted that pt had multiple bruising to R side, pt was initially refusing to be seen here, wanted to go [facility] in [place].  Pt had CVA with L sided weakness, she reports pt has not been cleaning himself, pt stares off when talking to him.

## 2014-01-25 NOTE — ED Provider Notes (Signed)
CSN: [id number]     Arrival date & time 01/25/14  1252 History   First MD Initiated Contact with Patient 01/25/14 1405     Chief Complaint  Patient presents with  . Loss of Consciousness  . Altered Mental Status      HPI Pt was seen at 1410. Per EMS, family and pt, c/o gradual onset and persistence of constant AMS for the past 1 week. Pt states he "fell getting out of the shower" 1 week ago. Pt states he "doesn't remember falling" and "just woke up on the floor." Pt's family states pt "has not been cleaning himself" and "stares off when you talk to him." Denies abd pain, no N/V/D, no fevers, no CP/palpitations, no SOB/cough, no neck or back pain.    Past Medical History  Diagnosis Date  . Traumatic brain injury   . Diabetes mellitus without complication   . Seizures   . Diastolic dysfunction     a. ECHO 07/2013 EF 55-60% and grade 1 diastolic dysfunction   History reviewed. No pertinent past surgical history.  History  Substance Use Topics  . Smoking status: Current Every Day Smoker    Types: Cigarettes  . Smokeless tobacco: Not on file  . Alcohol Use: Yes     Comment: rare    Review of Systems ROS: Statement: All systems negative except as marked or noted in the HPI; Constitutional: Negative for fever and chills. ; ; Eyes: Negative for eye pain, redness and discharge. ; ; ENMT: Negative for ear pain, hoarseness, nasal congestion, sinus pressure and sore throat. ; ; Cardiovascular: Negative for chest pain, palpitations, diaphoresis, dyspnea and peripheral edema. ; ; Respiratory: Negative for cough, wheezing and stridor. ; ; Gastrointestinal: Negative for nausea, vomiting, diarrhea, abdominal pain, blood in stool, hematemesis, jaundice and rectal bleeding. . ; ; Genitourinary: Negative for dysuria, flank pain and hematuria. ; ; Musculoskeletal: Negative for back pain and neck pain. Negative for swelling and trauma.; ; Skin: +ecchymosis. Negative for pruritus, rash, abrasions, blisters,  and skin lesion.; ; Neuro: Negative for headache, lightheadedness and neck stiffness. Negative for weakness, extremity weakness, paresthesias, involuntary movement, seizure and +AMS, syncope.      Allergies  Review of patient's allergies indicates no known allergies.  Home Medications   Prior to Admission medications   Medication Sig Start Date End Date Taking? Authorizing Provider  acetaminophen (TYLENOL) 500 MG tablet Take 500 mg by mouth 2 (two) times daily.    Yes Historical Provider, MD  carbamazepine (TEGRETOL) 200 MG tablet Take 300 mg by mouth 2 (two) times daily.   Yes Historical Provider, MD  clotrimazole (LOTRIMIN) 1 % cream Apply 1 application topically 2 (two) times daily.   Yes Historical Provider, MD  docusate sodium (COLACE) 100 MG capsule Take 200 mg by mouth 2 (two) times daily.   Yes Historical Provider, MD  galantamine (RAZADYNE) 8 MG tablet Take 1 tablet (8 mg total) by mouth 2 (two) times daily with a meal. 07/31/13  Yes [name], MD  levETIRAcetam (KEPPRA) 500 MG tablet Take 500 mg by mouth every 12 (twelve) hours.    Yes Historical Provider, MD  metFORMIN (GLUCOPHAGE) 500 MG tablet Take 500 mg by mouth 2 (two) times daily with a meal.    Yes Historical Provider, MD  naproxen (NAPROSYN) 500 MG tablet Take 500 mg by mouth 2 (two) times daily with a meal.    Yes Historical Provider, MD  phenytoin (DILANTIN) 100 MG ER capsule Take 100 mg  by mouth 2 (two) times daily.   Yes Historical Provider, MD  simvastatin (ZOCOR) 80 MG tablet Take 40 mg by mouth at bedtime.   Yes Historical Provider, MD   BP 126/86  Pulse 105  Temp(Src) 98.2 F (36.8 C) (Oral)  Resp 19  SpO2 100% Physical Exam 1415: Physical examination:  Nursing notes reviewed; Vital signs and O2 SAT reviewed;  Constitutional: Well developed, Well nourished, In no acute distress; Head:  Normocephalic, atraumatic; Eyes: EOMI, PERRL, No scleral icterus; ENMT: Mouth and pharynx normal, Mucous membranes dry;  Neck: Supple, Full range of motion, No lymphadenopathy; Cardiovascular: Regular rate and rhythm, No gallop; Respiratory: Breath sounds clear & equal bilaterally, No wheezes.  Speaking full sentences with ease, Normal respiratory effort/excursion; Chest: Nontender, Movement normal; Abdomen: Soft, Nontender, Nondistended, Normal bowel sounds; Genitourinary: No CVA tenderness; Spine:  No midline CS, TS, LS tenderness. +fading ecchymosis left flank.;; Extremities: Pulses normal, No tenderness, No edema, No calf edema or asymmetry.; Neuro: AA&Ox3, rambling historian. Major CN grossly intact. No facial droop. Speech clear. LUE and LLE weakness per hx. Pt moves all extremities on stretcher spontaneously and to command without apparent gross focal motor deficits in extremities.; Skin: Color normal, Warm, Dry.   ED Course  Procedures     EKG Interpretation   Date/Time:  Thursday January 25 2014 14:22:20 EDT Ventricular Rate:  79 PR Interval:  128 QRS Duration: 93 QT Interval:  417 QTC Calculation: 478 R Axis:   39 Text Interpretation:  Sinus rhythm Artifact Left ventricular hypertrophy  Abnormal T, consider ischemia, diffuse leads When compared with ECG of  07/28/2013 T wave abnormality is now Present Confirmed by [facility]  MD,  Nunzio Cory [phone]) on 01/25/2014 2:31:17 PM      MDM  MDM Reviewed: previous chart, nursing note and vitals Reviewed previous: labs and ECG Interpretation: labs, ECG and x-ray    Results for orders placed during the hospital encounter of 01/25/14  PROTIME-INR      Result Value Ref Range   Prothrombin Time 14.3  11.6 - 15.2 seconds   INR 1.11  0.00 - 1.49  APTT      Result Value Ref Range   aPTT 25  24 - 37 seconds  CBC      Result Value Ref Range   WBC 7.4  4.0 - 10.5 K/uL   RBC 4.68  4.22 - 5.81 MIL/uL   Hemoglobin 13.6  13.0 - 17.0 g/dL   HCT 42.9  39.0 - 52.0 %   MCV 91.7  78.0 - 100.0 fL   MCH 29.1  26.0 - 34.0 pg   MCHC 31.7  30.0 - 36.0 g/dL   RDW 13.9   11.5 - 15.5 %   Platelets 167  150 - 400 K/uL  DIFFERENTIAL      Result Value Ref Range   Neutrophils Relative % 51  43 - 77 %   Neutro Abs 3.7  1.7 - 7.7 K/uL   Lymphocytes Relative 39  12 - 46 %   Lymphs Abs 2.9  0.7 - 4.0 K/uL   Monocytes Relative 8  3 - 12 %   Monocytes Absolute 0.6  0.1 - 1.0 K/uL   Eosinophils Relative 2  0 - 5 %   Eosinophils Absolute 0.2  0.0 - 0.7 K/uL   Basophils Relative 0  0 - 1 %   Basophils Absolute 0.0  0.0 - 0.1 K/uL  COMPREHENSIVE METABOLIC PANEL      Result Value Ref Range  Sodium 152 (*) 137 - 147 mEq/L   Potassium 3.5 (*) 3.7 - 5.3 mEq/L   Chloride 113 (*) 96 - 112 mEq/L   CO2 24  19 - 32 mEq/L   Glucose, Bld 115 (*) 70 - 99 mg/dL   BUN 20  6 - 23 mg/dL   Creatinine, Ser 0.77  0.50 - 1.35 mg/dL   Calcium 9.7  8.4 - 10.5 mg/dL   Total Protein 8.1  6.0 - 8.3 g/dL   Albumin 4.3  3.5 - 5.2 g/dL   AST 109 (*) 0 - 37 U/L   ALT 119 (*) 0 - 53 U/L   Alkaline Phosphatase 82  39 - 117 U/L   Total Bilirubin 0.3  0.3 - 1.2 mg/dL   GFR calc non Af Amer >90  >90 mL/min   GFR calc Af Amer >90  >90 mL/min   Anion gap 15  5 - 15  ETHANOL      Result Value Ref Range   Alcohol, Ethyl (B) <11  0 - 11 mg/dL  PHENYTOIN LEVEL, TOTAL      Result Value Ref Range   Phenytoin Lvl 3.2 (*) 10.0 - 20.0 ug/mL  CARBAMAZEPINE LEVEL, TOTAL      Result Value Ref Range   Carbamazepine Lvl 2.9 (*) 4.0 - 12.0 ug/mL  CBG MONITORING, ED      Result Value Ref Range   Glucose-Capillary 124 (*) 70 - 99 mg/dL  I-STAT CHEM 8, ED      Result Value Ref Range   Sodium 150 (*) 137 - 147 mEq/L   Potassium 3.3 (*) 3.7 - 5.3 mEq/L   Chloride 118 (*) 96 - 112 mEq/L   BUN 18  6 - 23 mg/dL   Creatinine, Ser 0.80  0.50 - 1.35 mg/dL   Glucose, Bld 117 (*) 70 - 99 mg/dL   Calcium, Ion 1.18  1.13 - 1.30 mmol/L   TCO2 23  0 - 100 mmol/L   Hemoglobin 15.3  13.0 - 17.0 g/dL   HCT 45.0  39.0 - 52.0 %  I-STAT TROPOININ, ED      Result Value Ref Range   Troponin i, poc 0.02  0.00 -  0.08 ng/mL   Comment 3            Dg Chest Port 1 View 01/25/2014   CLINICAL DATA:  Altered mental status.  EXAM: PORTABLE CHEST - 1 VIEW  COMPARISON:  Single view of the chest 07/26/2013.  FINDINGS: Right upper lobe airspace disease seen on the comparison study has resolved. Lungs are clear. Heart size is normal. No pneumothorax or pleural effusion.  IMPRESSION: No acute disease.   Electronically Signed   By: Inge Rise M.D.   On: 01/25/2014 14:29    1545:  Hypernatremic on labs; will continue judicious IVF. LFT's elevated per baseline. UDS, UA/UC, and CT-H pending. Dx and testing d/w pt and family.  Questions answered.  Verb understanding, agreeable to admit. T/C to [facility] Resident, case discussed, including:  HPI, pertinent PM/SHx, VS/PE, dx testing, ED course and treatment:  Agreeable to admit, requests to write temporary orders, obtain inpt tele bed to Dr. Chanetta Marshall service.   Alfonzo Feller, DO 01/27/14 1427

## 2014-01-26 ENCOUNTER — Encounter (HOSPITAL_COMMUNITY): Payer: Self-pay | Admitting: General Practice

## 2014-01-26 DIAGNOSIS — I5032 Chronic diastolic (congestive) heart failure: Secondary | ICD-10-CM

## 2014-01-26 DIAGNOSIS — B1920 Unspecified viral hepatitis C without hepatic coma: Secondary | ICD-10-CM | POA: Diagnosis not present

## 2014-01-26 DIAGNOSIS — R55 Syncope and collapse: Secondary | ICD-10-CM | POA: Diagnosis not present

## 2014-01-26 DIAGNOSIS — E119 Type 2 diabetes mellitus without complications: Secondary | ICD-10-CM

## 2014-01-26 DIAGNOSIS — I509 Heart failure, unspecified: Secondary | ICD-10-CM

## 2014-01-26 DIAGNOSIS — Y92009 Unspecified place in unspecified non-institutional (private) residence as the place of occurrence of the external cause: Secondary | ICD-10-CM

## 2014-01-26 DIAGNOSIS — I10 Essential (primary) hypertension: Secondary | ICD-10-CM

## 2014-01-26 DIAGNOSIS — G40909 Epilepsy, unspecified, not intractable, without status epilepticus: Secondary | ICD-10-CM | POA: Diagnosis not present

## 2014-01-26 DIAGNOSIS — E785 Hyperlipidemia, unspecified: Secondary | ICD-10-CM

## 2014-01-26 DIAGNOSIS — R748 Abnormal levels of other serum enzymes: Secondary | ICD-10-CM | POA: Diagnosis present

## 2014-01-26 DIAGNOSIS — W19XXXA Unspecified fall, initial encounter: Secondary | ICD-10-CM

## 2014-01-26 LAB — URINE CULTURE
COLONY COUNT: NO GROWTH
CULTURE: NO GROWTH

## 2014-01-26 LAB — GLUCOSE, CAPILLARY
GLUCOSE-CAPILLARY: 151 mg/dL — AB (ref 70–99)
Glucose-Capillary: 90 mg/dL (ref 70–99)
Glucose-Capillary: 150 mg/dL — ABNORMAL HIGH (ref 70–99)

## 2014-01-26 LAB — COMPREHENSIVE METABOLIC PANEL
ALT: 112 U/L — ABNORMAL HIGH (ref 0–53)
AST: 106 U/L — AB (ref 0–37)
Albumin: 3.2 g/dL — ABNORMAL LOW (ref 3.5–5.2)
Alkaline Phosphatase: 66 U/L (ref 39–117)
Anion gap: 13 (ref 5–15)
BILIRUBIN TOTAL: 0.4 mg/dL (ref 0.3–1.2)
BUN: 14 mg/dL (ref 6–23)
CHLORIDE: 112 meq/L (ref 96–112)
CO2: 22 meq/L (ref 19–32)
Calcium: 8.7 mg/dL (ref 8.4–10.5)
Creatinine, Ser: 0.67 mg/dL (ref 0.50–1.35)
GFR calc Af Amer: >90 mL/min (ref >90)
GFR calc non Af Amer: >90 mL/min (ref >90)
Glucose, Bld: 87 mg/dL (ref 70–99)
Potassium: 3.9 mEq/L (ref 3.7–5.3)
SODIUM: 147 meq/L (ref 137–147)
Total Protein: 6.5 g/dL (ref 6.0–8.3)

## 2014-01-26 LAB — CBC
HCT: 38.9 % — ABNORMAL LOW (ref 39.0–52.0)
Hemoglobin: 12.2 g/dL — ABNORMAL LOW (ref 13.0–17.0)
MCH: 28.7 pg (ref 26.0–34.0)
MCHC: 31.4 g/dL (ref 30.0–36.0)
MCV: 91.5 fL (ref 78.0–100.0)
Platelets: 129 10*3/uL — ABNORMAL LOW (ref 150–400)
RBC: 4.25 MIL/uL (ref 4.22–5.81)
RDW: 13.6 % (ref 11.5–15.5)
WBC: 6.2 10*3/uL (ref 4.0–10.5)

## 2014-01-26 LAB — TROPONIN I: Troponin I: <0.3 ng/mL (ref <0.30)

## 2014-01-26 NOTE — Progress Notes (Signed)
Utilization review completed.  

## 2014-01-26 NOTE — Progress Notes (Signed)
Internal Medicine Attending Admission Note Date: 01/26/2014  Patient name: Wyatt Salas Medical record number: [id number] Date of birth: [birth year] Age: 63 y.o. Gender: male  I saw and evaluated the patient. I reviewed the resident's note and I agree with the resident's findings and plan as documented in the resident's note.  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 Other than some pain at the site of the fall he denied any new symptoms. He was strongly encouraged to come to the emergency department for further evaluation by his friend. He was admitted to the internal medicine teaching service for further evaluation and care.  Since admission Wyatt Salas has had no symptoms to suggest that he had a syncopal or seizure event. He is not orthostatic on examination, and although his antiepileptic levels are low and the description of the event is not consistent with a seizure. Cardiac workup has been negative thus far.  Physical Exam - key components related to admission:  Filed Vitals:   01/26/14 0353 01/26/14 0430 01/26/14 1008 01/26/14 1410  BP: 122/78 143/46 146/67 111/74  Pulse: 54 81 62 75  Temp: 98.2 F (36.8 C) 97.9 F (36.6 C)  98 F (36.7 C)  TempSrc: Oral Oral  Oral  Resp: 18 18  20   Height:      Weight: 169 lb 3.2 oz (76.749 kg)     SpO2: 100% 100%  96%   Gen.: Well-developed, well nourished, man sitting comfortably in a chair in no acute distress. He is very conversant and pleasant with an intact sense of humor. Heart: Regular rate and rhythm without murmurs, rubs, or gallops.  Lab results:  Basic Metabolic Panel:  Recent Labs  01/25/14 1314 01/25/14 1345  01/26/14 0550  NA 152* 150* 147  K 3.5* 3.3* 3.9  CL 113* 118* 112  CO2 24  --  22  GLUCOSE 115* 117* 87  BUN 20 18 14   CREATININE 0.77 0.80 0.67  CALCIUM 9.7  --  8.7   Liver Function Tests:  Recent Labs  01/25/14 1314 01/26/14 0550  AST 109* 106*  ALT 119* 112*  ALKPHOS 82 66  BILITOT 0.3 0.4  PROT 8.1 6.5  ALBUMIN 4.3 3.2*   CBC:  Recent Labs  01/25/14 1314 01/25/14 1345 01/26/14 0550  WBC 7.4  --  6.2  NEUTROABS 3.7  --   --   HGB 13.6 15.3 12.2*  HCT 42.9 45.0 38.9*  MCV 91.7  --  91.5  PLT 167  --  129*   Cardiac Enzymes:  Recent Labs  01/25/14 2230 01/26/14 0550 01/26/14 0945  CKTOTAL 756*  --   --   TROPONINI <0.30 <0.30 <0.30   CBG:  Recent Labs  01/25/14 1429 01/25/14 2208 01/26/14 0743 01/26/14 1132  GLUCAP 124* 151* 90 150*   Coagulation:  Recent Labs  01/25/14 1314  INR 1.11   Urine Drug Screen:  Negative  Alcohol Level:  Recent Labs  01/25/14 1429  ETH <11   Urinalysis:  15 ketones, 100 protein, trace leukocytes, 0-2 white blood cells per high-power field  Misc. Labs:  Carbamazepine 2.9 Phenytoin 3.2  Imaging results:  Ct Head Wo Contrast  01/25/2014   CLINICAL DATA:  Status post fall.  Altered mental status.  EXAM: CT HEAD WITHOUT CONTRAST  TECHNIQUE: Contiguous axial images were obtained from the base of the skull through the vertex without intravenous contrast.  COMPARISON:  Head CT scan 07/26/2013 and 05/19/2013.  FINDINGS: Atrophy, chronic microvascular ischemic change and remote right MCA territory infarct are again seen. No evidence of acute abnormality including infarct, hemorrhage, mass lesion, mass effect, midline shift or abnormal extra-axial fluid collection is identified. Right craniotomy defect is again seen. Imaged paranasal sinuses and mastoid air cells are clear.  IMPRESSION: No acute finding.  Stable compared to prior exams.   Electronically Signed   By: Inge Rise M.D.   On: 01/25/2014 16:11    Dg Chest Port 1 View  01/25/2014   CLINICAL DATA:  Altered mental status.  EXAM: PORTABLE CHEST - 1 VIEW  COMPARISON:  Single view of the chest 07/26/2013.  FINDINGS: Right upper lobe airspace disease seen on the comparison study has resolved. Lungs are clear. Heart size is normal. No pneumothorax or pleural effusion.  IMPRESSION: No acute disease.   Electronically Signed   By: Inge Rise M.D.   On: 01/25/2014 14:29   Other results:  EKG: Normal sinus rhythm at 63 beats per minute, normal axis, normal intervals, 1-2 mm ST elevation in V1 and V2. T wave inversion in V4 through V6, II, III, and aVF.  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 Evaluation for a new neurologic or a cardiac cause of the fall has been negative.  1) Fall: He is already receiving occupational therapy at home through the [place]. This will be continued.  2) Disposition: He is stable for discharge home. Followup will be at the [place].

## 2014-01-26 NOTE — Progress Notes (Signed)
Pt a/o, c/o back pain PRN tylenol given as ordered, pt has sore to left foot on medial side, skin intact no drainage, pt also has sore to 2nd toe on left with scab, both OTA, no drainage, pt stable

## 2014-01-26 NOTE — Evaluation (Signed)
Physical Therapy Evaluation Patient Details Name: Wyatt Salas MRN: [id number] DOB: 05-10-1951 Today's Date: 01/26/2014   History of Present Illness  Wyatt Salas is a 63 y.o. Maleadmitted 01/25/14 with PMHx of Subdural hematoma s/p R sided craniotomy, DMII, seizure disorder, chronic Hepatitis C, HLD, HTN, chronic diastolic congestive heart failure stage 1 and h/o of NSTEMI who presents to the ED upon request of his friend for fall and altered mental status. Pt states he fell getting out of the shower 2-3 days ago  Clinical Impression  Pt  Reports his gait is near baseline. Encouraged pt to use RW at home. Pt will benefit from PT while in acute care to address problems listed in note below.    Follow Up Recommendations Home health PT;Supervision - Intermittent, assist for in/out shower,    Equipment Recommendations  None recommended by PT    Recommendations for Other Services       Precautions / Restrictions Precautions Precautions: Fall Restrictions Weight Bearing Restrictions: No      Mobility  Bed Mobility Overal bed mobility: Modified Independent                Transfers Overall transfer level: Needs assistance Equipment used: Quad cane Transfers: Sit to/from Stand Sit to Stand: Supervision         General transfer comment: extra time from bed, use of rail from toilet  Ambulation/Gait Ambulation/Gait assistance: Supervision Ambulation Distance (Feet): 200 Feet Assistive device: Quad cane Gait Pattern/deviations: Step-to pattern;Decreased step length - left;Decreased weight shift to left;Shuffle   Gait velocity interpretation: <1.8 ft/sec, indicative of risk for recurrent falls General Gait Details: cues for safety, objects  in his path  Stairs            Wheelchair Mobility    Modified Rankin (Stroke Patients Only)       Balance Overall balance assessment: Needs assistance;History of Falls Sitting-balance support: No upper extremity  supported;Feet supported Sitting balance-Leahy Scale: Good     Standing balance support: No upper extremity supported;During functional activity Standing balance-Leahy Scale: Fair Standing balance comment: stood at sink to brush his teeth, leaned against counter.                             Pertinent Vitals/Pain L trunk and foot     Home Living Family/patient expects to be discharged to:: Private residence Living Arrangements: Alone Available Help at Discharge: Friend(s) Type of Home: Apartment Home Access: Elevator     Home Layout: One level Home Equipment: [place] held shower head;Wheelchair - Education officer, community - power;Shower seat;Walker - 2 wheels      Prior Function Level of Independence: Independent with assistive device(s)         Comments: uses [employer] transportation, uses quad cane and Manual chair mostly, uses RW also     Hand Dominance    Right    Extremity/Trunk Assessment    OT to assess.           Lower Extremity Assessment: LLE deficits/detail   LLE Deficits / Details: decreased coordination,      Communication   Communication: No difficulties  Cognition Arousal/Alertness: Awake/alert Behavior During Therapy: WFL for tasks assessed/performed Overall Cognitive Status: Within Functional Limits for tasks assessed                      General Comments      Exercises  Assessment/Plan    PT Assessment Patient needs continued PT services  PT Diagnosis Difficulty walking   PT Problem List Decreased balance;Decreased mobility;Decreased safety awareness;Decreased knowledge of use of DME  PT Treatment Interventions DME instruction;Gait training;Functional mobility training;Therapeutic activities;Balance training;Patient/family education   PT Goals (Current goals can be found in the Care Plan section) Acute Rehab PT Goals Patient Stated Goal: to go home.  PT Goal Formulation: With patient Time For Goal  Achievement: 02/09/14 Potential to Achieve Goals: Good    Frequency Min 3X/week   Barriers to discharge Decreased caregiver support      Co-evaluation               End of Session Equipment Utilized During Treatment: Gait belt Activity Tolerance: Patient tolerated treatment well Patient left: in chair;with call bell/phone within reach;with chair alarm set;with family/visitor present Nurse Communication: Mobility status         Time: 3419-6222 PT Time Calculation (min): 37 min   Charges:   PT Evaluation $Initial PT Evaluation Tier I: 1 Procedure PT Treatments $Gait Training: 23-37 mins   PT G Codes:          Claretha Cooper 01/26/2014, 10:41 AM Tresa Endo PT [phone]

## 2014-01-26 NOTE — Discharge Instructions (Signed)

## 2014-01-26 NOTE — Progress Notes (Addendum)
Subjective: Patient feels "with my hands" this morning. He enjoyed PT this morning and feels like he is ready to go home. He explains that his fall was simply due to a shower seat in his bathtub that was too small; in fact, when he went down, he was able to grab onto the towel rack, which is now damaged.  Objective: Vital signs in last 24 hours: Filed Vitals:   01/26/14 0353 01/26/14 0430 01/26/14 1008 01/26/14 1410  BP: 122/78 143/46 146/67 111/74  Pulse: 54 81 62 75  Temp: 98.2 F (36.8 C) 97.9 F (36.6 C)  98 F (36.7 C)  TempSrc: Oral Oral  Oral  Resp: 18 18  20   Height:      Weight: 169 lb 3.2 oz (76.749 kg)     SpO2: 100% 100%  96%   Weight change:   Intake/Output Summary (Last 24 hours) at 01/26/14 1453 Last data filed at 01/26/14 1300  Gross per 24 hour  Intake 2388.33 ml  Output   1125 ml  Net 1263.33 ml   Physical exam: General: Patient is a well-developed and well-nourished, in no acute distress and is very jovial this morning Head: Normocephalic and atraumatic; old scar (depigmented), no signs of tongue biting Eyes: PERRL, EOMI, normal conjunctiva Neck: Supple, no carotid bruit present  Cardiovascular: RRR, S1 normal, S2 normal, no murmurs, gallops, or rubs.  Pulmonary/Chest: CTA bilaterally, no wheezes, rales, or rhonchi.  Abdominal: Soft, non-tender, non-distended, BS +, no masses, organomegaly, or guarding present.  Extremities: Small, medial left foot lesion, no swelling or edema, pulses symmetric and intact bilaterally. No cyanosis or clubbing. Neurological: A&O x3, 3/5 strength in left upper extremity and left lower extremity. 5/5 strength in right upper and lower extremities. cranial nerve II-XII are grossly intact, sensory intact to light touch bilaterally.  Skin: Dry, chapped lips. Ecchymosis along L flank and L hip. Warm, dry and intact. No rashes or erythema.  Lab Results:  01/26/14   NA  147   K  3.9   CL  112   CO2  2266   GLUCOSE  87   BUN   14    CREATININE  0.67   CALCIUM  8.7    Liver Function Tests:   Recent Labs   01/25/14 1314   AST  106  ALT  112  ALKPHOS  66  BILITOT  0.4   PROT  6.5   ALBUMIN  3.2    CBC:   Recent Labs   01/26/14   WBC  6.2        HGB  12.2    HCT  38.9         PLT  129 --    Troponins negative x3  CK 756  Micro Results: No results found for this or any previous visit (from the past 240 hour(s)). Studies/Results: Ct Head Wo Contrast  01/25/2014   CLINICAL DATA:  Status post fall.  Altered mental status.  EXAM: CT HEAD WITHOUT CONTRAST  TECHNIQUE: Contiguous axial images were obtained from the base of the skull through the vertex without intravenous contrast.  COMPARISON:  Head CT scan 07/26/2013 and 05/19/2013.  FINDINGS: Atrophy, chronic microvascular ischemic change and remote right MCA territory infarct are again seen. No evidence of acute abnormality including infarct, hemorrhage, mass lesion, mass effect, midline shift or abnormal extra-axial fluid collection is identified. Right craniotomy defect is again seen. Imaged paranasal sinuses and mastoid air cells are clear.  IMPRESSION: No acute  finding.  Stable compared to prior exams.   Electronically Signed   By: Inge Rise M.D.   On: 01/25/2014 16:11   Dg Chest Port 1 View  01/25/2014   CLINICAL DATA:  Altered mental status.  EXAM: PORTABLE CHEST - 1 VIEW  COMPARISON:  Single view of the chest 07/26/2013.  FINDINGS: Right upper lobe airspace disease seen on the comparison study has resolved. Lungs are clear. Heart size is normal. No pneumothorax or pleural effusion.  IMPRESSION: No acute disease.   Electronically Signed   By: Inge Rise M.D.   On: 01/25/2014 14:29   Medications: I have reviewed the patient's current medications. Scheduled Meds: . aspirin EC  81 mg Oral Daily  . carbamazepine  300 mg Oral BID  . docusate sodium  200 mg Oral BID  . enoxaparin (LOVENOX) injection  40 mg Subcutaneous Q24H  . insulin aspart   0-9 Units Subcutaneous TID WC  . levETIRAcetam  1,000 mg Intravenous Q12H  . phenytoin  100 mg Oral BID   Continuous Infusions:  PRN Meds:.acetaminophen, acetaminophen Assessment/Plan: Principal Problem:   Fall Active Problems:   Altered mental status   Seizure disorder   Syncope   Fall at home  This 63 yo man with a history of a subdural hematoma (s/p right sided craniotomy), DMII, seizure disorder, chronic hepatitis C, HLD, HTN, chronic diastolic CHF stage 1 and CAD is here for evaluation of a recent fall in his bathtub. In speaking with him, the fall truly sounds mechanical, as his shower chair was too small. His cardiac workup has been negative, with 3 negative troponins and no EKG changes, and his CT head showed no acute findings. Though his carbamazepine and phenytoin levels are subtherapeutic, the patient has no signs of seizure activity on physical exam. There may have been an element of dehydration that contributed to his fall (he had been in a hot shower, he hadn't been eating / drinking as much as usual, and he admits his air conditioning hadn't been working, but has now been fixed). At this point, the fall seems purely mechanical, perhaps brought on by a bit of dehydration, which has now been corrected with IVF, and the patient is already set up with home PT and OT.   Fall: Pt admits to dehydration and eating less, along with recent lack of air conditioning. Chapped, cracked lips on exam. Pt denies dysuria or frequency. At home, pt takes carbamazepine 300 mg BID, galantamine 8 mg BID, phenytoin 100 mg ER and keppra 500 mg BID.  -Troponins negative x3  -NS 100 mL/hr IV  -Gave loading dose of dilantin and keppra after discussion with Dr. 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 Pt is AAOx3, CN II-XII grossly intact. CT head w/o contrast negative for acute abnormalities. Pt is appropriate of speech and mannerisms. Pt denies any confusion or behavioral changes.  -Hold galantamine for now.  -In conversation with friend/sister today, she admits his fall was probably just a mechanical problem  EKG with inferolateral T wave inversions: EKG changes seen that are different on comparison from most recent EKG on 07/28/13. On further study, EKG from October 2014 has similar EKG changes which at the time were attributed to LVH. Pt does have history of  NSTEMI. Pt denies CP, SOB. Vital signs are stable HR 71 BP 119/71 RR 16 Pulse ox 100% on room air. Previous notes indicate that pt was supposed to have outpatient follow up with Dr. Terrence Dupont, which did not happen.  -Troponins negative x3 -On ASA 81 mg daily  -EKG NSR this morning (one abnormal EKG overnight, which was similar to a prior EKG, with T wave inversions in leads II, aVF, V3, V4, V5 and V6  Hepatitis C: Chronically elevated LFTs AST 109 and ALT 119. Gradual increase from AST 64 and ALT 68 since 2011. Denies history of Hepatitis C.   DM II: On Metformin 500 mg BID at home. Glucose 115 on admission. Last HbA1c 5.8 in 04/21/2010.  -SSI-sensitive   HTN: History of HTN. Not currently hypertensive. BP 119/71 currently. Not on any hypertensive medications at home.  Chronic diastolic congestive heart failure stage 1: Last echocardiogram showed mild LVH, EF 27-74%, systolic function normal, abnormal left ventricular relaxation with grade 1 diastolic dysfunction. Statin at home.  -On ASA 81 mg daily  -Hold statin for now   HLD: On simvastatin at home. LFT abnormalities. History of rhabdomyolysis and recent fall.  -Hold for now simvastatin   DVT ppx: Lovenox 40 mg daily    Dispo: Disposition is deferred at this time, awaiting improvement of current medical problems.  Anticipated discharge in approximately 0 day(s).   The  patient does not have a current PCP (Pcp Not In System) and does need an [facility] hospital follow-up appointment after discharge.  The patient does not have transportation limitations that hinder transportation to clinic appointments.  .Services Needed at time of discharge: Y = Yes, Blank = No PT:   OT:   RN:   Equipment:   Other:     LOS: 1 day   Drucilla Schmidt, MD 01/26/2014, 2:53 PM

## 2014-01-26 NOTE — Progress Notes (Signed)
D/c instructions reviewed with pt and his sister (POA) by RN Asaad Gulley [place] (Therapist, sports), copy of instructions given to pt and sister. Pt d/c'd via wheelchair with belongings with sister, escorted by unit NT.

## 2014-01-26 NOTE — Discharge Summary (Signed)
Name: Wyatt Salas MRN: [id number] DOB: October 22, 1950 63 y.o. PCP: Pcp Not In System  Date of Admission: 01/25/2014  1:35 PM Date of Discharge: 01/26/2014 Attending Physician: Karren Cobble, MD  Discharge Diagnosis: Principal Problem:   Fall Active Problems:   Altered mental status   Seizure disorder   Syncope   Fall at home  Discharge Medications:   Medication List    ASK your doctor about these medications       acetaminophen 500 MG tablet  Commonly known as:  TYLENOL  Take 500 mg by mouth 2 (two) times daily.     carbamazepine 200 MG tablet  Commonly known as:  TEGRETOL  Take 300 mg by mouth 2 (two) times daily.     clotrimazole 1 % cream  Commonly known as:  LOTRIMIN  Apply 1 application topically 2 (two) times daily.     docusate sodium 100 MG capsule  Commonly known as:  COLACE  Take 200 mg by mouth 2 (two) times daily.     galantamine 8 MG tablet  Commonly known as:  RAZADYNE  Take 1 tablet (8 mg total) by mouth 2 (two) times daily with a meal.     levETIRAcetam 500 MG tablet  Commonly known as:  KEPPRA  Take 500 mg by mouth every 12 (twelve) hours.     metFORMIN 500 MG tablet  Commonly known as:  GLUCOPHAGE  Take 500 mg by mouth 2 (two) times daily with a meal.     naproxen 500 MG tablet  Commonly known as:  NAPROSYN  Take 500 mg by mouth 2 (two) times daily with a meal.     phenytoin 100 MG ER capsule  Commonly known as:  DILANTIN  Take 100 mg by mouth 2 (two) times daily.     simvastatin 80 MG tablet  Commonly known as:  ZOCOR  Take 40 mg by mouth at bedtime.       Disposition and follow-up:   Wyatt Salas was discharged from [facility] in Stable condition.  At the hospital follow up visit please address:  1.  Recent hospitalization for mechanical fall. Subtherapeutic carbamazepine and phenytoin levels. Consider investigating abnormal EKGs- patient had one EKG with T wave inversions in leads II, aVF, V3, V4,  V5, and V6 which was similar to a prior EKG. Then patient had a normal EKG the next morning. He had been scheduled for cardiology follow up in the past, but never attended appointment.  2.  Labs / imaging needed at time of follow-up: EKG  3.  Pending labs/ test needing follow-up: none  Follow-up Appointments:     Follow-up Information   Follow up with [facility]. (active with HH-RN- will add HH-PT services)    Contact information:   [address] [place] [address] [phone]       Discharge Instructions: Continue with home PT and home health RN. Address seizure medications.  Consultations:  PT and OT  Procedures Performed:  Ct Head Wo Contrast  01/25/2014   CLINICAL DATA:  Status post fall.  Altered mental status.  EXAM: CT HEAD WITHOUT CONTRAST  TECHNIQUE: Contiguous axial images were obtained from the base of the skull through the vertex without intravenous contrast.  COMPARISON:  Head CT scan 07/26/2013 and 05/19/2013.  FINDINGS: Atrophy, chronic microvascular ischemic change and remote right MCA territory infarct are again seen. No evidence of acute abnormality including infarct, hemorrhage, mass lesion, mass effect, midline  shift or abnormal extra-axial fluid collection is identified. Right craniotomy defect is again seen. Imaged paranasal sinuses and mastoid air cells are clear.  IMPRESSION: No acute finding.  Stable compared to prior exams.   Electronically Signed   By: Inge Rise M.D.   On: 01/25/2014 16:11   Dg Chest Port 1 View  01/25/2014   CLINICAL DATA:  Altered mental status.  EXAM: PORTABLE CHEST - 1 VIEW  COMPARISON:  Single view of the chest 07/26/2013.  FINDINGS: Right upper lobe airspace disease seen on the comparison study has resolved. Lungs are clear. Heart size is normal. No pneumothorax or pleural effusion.  IMPRESSION: No acute disease.   Electronically Signed   By: Inge Rise M.D.   On: 01/25/2014 14:29    2D Echo:  none  Cardiac Cath: none  Admission HPI: Wyatt Salas is a 63 y.o. Male with PMHx of Subdural hematoma s/p R sided craniotomy, DMII, seizure disorder, chronic Hepatitis C, HLD, HTN, chronic diastolic congestive heart failure stage 1 and h/o of NSTEMI who presents to the ED upon request of his friend for fall and altered mental status. Pt states he fell getting out of the shower 2-3 days ago. He says the shower chair was too small for him and he slipped. He denies any loss of consciousness, dizziness, lightheadedness, seizure, chest pain of shortness of breath before or after fall. Pt only complains of headache and Left sided back, hip and leg pain from where he fell getting out of the shower. Pt denies any change in behavior recently, denies any muscle weakness, loss of strength, change in vision or speech. Pt denies any history of stroke. He states his last seizure was several years ago. Pt does admit that he mixed up his morning and evening medications on the day that he fell; however, morning and evening medication doses are the same.. Pt does admit to eating and drinking less fluid recently because his EBT card was cancelled. Pt drinks about 12 oz of beer daily and smokes marijuana. In the ED, patient's friend said that pt has not been cleaning himself lately and will stare off into space.    Hospital Course by problem list: Principal Problem:   Fall Active Problems:   Altered mental status   Seizure disorder   Syncope   Fall at home This 62 yo man with a history of a subdural hematoma (s/p right sided craniotomy), DMII, seizure disorder, chronic hepatitis C, HLD, HTN, chronic diastolic CHF stage 1 and CAD is here for evaluation of a recent fall in his bathtub. In speaking with him, the fall truly sounds mechanical, as his shower chair was too small. His cardiac workup has been negative, with 3 negative troponins and no EKG changes, and his CT head showed no acute findings. With regard to a  neurologic cause, his head CT showed no signs of bleeding or other acute changes. Though his carbamazepine and phenytoin levels are subtherapeutic, the patient has no signs of seizure activity on physical exam. His seizure-prevention medications should be reviewed as an outpatient to assess their necessity / doses / whether the patient is compliant, in light of his levels on this appointment. There may have been an element of dehydration that contributed to his fall (he had been in a hot shower, he hadn't been eating / drinking as much as usual, and he admits his air conditioning hadn't been working, but has now been fixed). At this point, the fall seems purely mechanical, perhaps  brought on by a bit of dehydration, which has now been corrected with IVF, and the patient is already set up with home PT and OT.   Fall: Patient's cardiac and neuro workup were negative. Pt admits to dehydration and eating less, along with recent lack of air conditioning. He had chapped, cracked lips on exam. Pt denies dysuria or frequency. At home, patient takes carbamazepine 300 mg BID, galantamine 8 mg BID, phenytoin 100 mg ER and keppra 500 mg BID.  -Repeat EKG in am  -Trend troponins  -NS 100 mL/hr IV  -Gave loading dose of dilantin and keppra after discussion with Dr. Nicole Kindred  -Carbamazepine 300 mg BID, keppra 1000 mg IV BID, and phenytoin ER 100 mg BID  -Hold galantamine for now due to CNS depression   AMS, resolved: Pt's sister/friend stated he has not been cleaning himself lately and will stare off into space, but he is AAOx3 on our exam, cracking jokes, and his CN II-XII are grossly intact. CT head w/o contrast negative for acute abnormalities. Pt denies any confusion or behavioral changes. On second conversation with the patient's sister, she admits the fall may have been mechanical in nature. -Held galantamine in hospital   EKG with inferolateral T wave inversions: EKG changes seen that are different on comparison  from most recent EKG on 07/28/13. On further study, EKG from October 2014 has similar EKG changes which at the time were attributed to LVH. Pt does have history of NSTEMI. Pt denies CP, SOB. Vital signs are stable HR 71 BP 119/71 RR 16 Pulse ox 100% on room air. Previous notes indicate that pt was supposed to have outpatient follow up with Dr. Terrence Dupont, which did not happen.  -Troponins negative x3  -On ASA 81 mg daily  -EKG NSR this morning (one abnormal EKG overnight, which was similar to a prior EKG, with T wave inversions in leads II, aVF, V3, V4, V5 and V6)  Hepatitis C: Chronically elevated LFTs AST 109 and ALT 119. Gradual increase from AST 64 and ALT 68 since 2011. Denies history of Hepatitis C.   DM II: On Metformin 500 mg BID at home. Glucose 115 on admission. Last HbA1c 5.8 in 04/21/2010.  -managed by SSI in hospital  HTN: History of HTN. Not currently hypertensive. BP 119/71 currently. Not on any hypertensive medications at home.   Chronic diastolic congestive heart failure stage 1: Last echocardiogram showed mild LVH, EF 78-29%, systolic function normal, abnormal left ventricular relaxation with grade 1 diastolic dysfunction. Statin at home.  -On ASA 81 mg daily  -Held statin in hospital  HLD: On simvastatin at home. LFT abnormalities. History of rhabdomyolysis and recent fall.  -Held simvastatin in hospital  Discharge Vitals:   BP 111/74  Pulse 75  Temp(Src) 98 F (36.7 C) (Oral)  Resp 20  Ht 6\' 1"  (1.854 m)  Wt 169 lb 3.2 oz (76.749 kg)  BMI 22.33 kg/m2  SpO2 96%  Discharge Labs:  Results for orders placed during the hospital encounter of 01/25/14 (from the past 24 hour(s))  URINALYSIS, ROUTINE W REFLEX MICROSCOPIC     Status: Abnormal   Collection Time    01/25/14  5:00 PM      Result Value Ref Range   Color, Urine AMBER (*) YELLOW   APPearance CLEAR  CLEAR   Specific Gravity, Urine 1.029  1.005 - 1.030   pH 5.5  5.0 - 8.0   Glucose, UA NEGATIVE  NEGATIVE mg/dL    Hgb urine dipstick  NEGATIVE  NEGATIVE   Bilirubin Urine NEGATIVE  NEGATIVE   Ketones, ur 15 (*) NEGATIVE mg/dL   Protein, ur 100 (*) NEGATIVE mg/dL   Urobilinogen, UA 1.0  0.0 - 1.0 mg/dL   Nitrite NEGATIVE  NEGATIVE   Leukocytes, UA TRACE (*) NEGATIVE  URINE RAPID DRUG SCREEN (HOSP PERFORMED)     Status: None   Collection Time    01/25/14  5:00 PM      Result Value Ref Range   Opiates NONE DETECTED  NONE DETECTED   Cocaine NONE DETECTED  NONE DETECTED   Benzodiazepines NONE DETECTED  NONE DETECTED   Amphetamines NONE DETECTED  NONE DETECTED   Tetrahydrocannabinol NONE DETECTED  NONE DETECTED   Barbiturates NONE DETECTED  NONE DETECTED  URINE MICROSCOPIC-ADD ON     Status: None   Collection Time    01/25/14  5:00 PM      Result Value Ref Range   WBC, UA 0-2  <3 WBC/hpf   Bacteria, UA RARE  RARE  GLUCOSE, CAPILLARY     Status: Abnormal   Collection Time    01/25/14 10:08 PM      Result Value Ref Range   Glucose-Capillary 151 (*) 70 - 99 mg/dL  TROPONIN I     Status: None   Collection Time    01/25/14 10:30 PM      Result Value Ref Range   Troponin I <0.30  <0.30 ng/mL  CK     Status: Abnormal   Collection Time    01/25/14 10:30 PM      Result Value Ref Range   Total CK 756 (*) 7 - 232 U/L  TROPONIN I     Status: None   Collection Time    01/26/14  5:50 AM      Result Value Ref Range   Troponin I <0.30  <0.30 ng/mL  COMPREHENSIVE METABOLIC PANEL     Status: Abnormal   Collection Time    01/26/14  5:50 AM      Result Value Ref Range   Sodium 147  137 - 147 mEq/L   Potassium 3.9  3.7 - 5.3 mEq/L   Chloride 112  96 - 112 mEq/L   CO2 22  19 - 32 mEq/L   Glucose, Bld 87  70 - 99 mg/dL   BUN 14  6 - 23 mg/dL   Creatinine, Ser 0.67  0.50 - 1.35 mg/dL   Calcium 8.7  8.4 - 10.5 mg/dL   Total Protein 6.5  6.0 - 8.3 g/dL   Albumin 3.2 (*) 3.5 - 5.2 g/dL   AST 106 (*) 0 - 37 U/L   ALT 112 (*) 0 - 53 U/L   Alkaline Phosphatase 66  39 - 117 U/L   Total Bilirubin 0.4  0.3 -  1.2 mg/dL   GFR calc non Af Amer >90  >90 mL/min   GFR calc Af Amer >90  >90 mL/min   Anion gap 13  5 - 15  CBC     Status: Abnormal   Collection Time    01/26/14  5:50 AM      Result Value Ref Range   WBC 6.2  4.0 - 10.5 K/uL   RBC 4.25  4.22 - 5.81 MIL/uL   Hemoglobin 12.2 (*) 13.0 - 17.0 g/dL   HCT 38.9 (*) 39.0 - 52.0 %   MCV 91.5  78.0 - 100.0 fL   MCH 28.7  26.0 - 34.0 pg   MCHC 31.4  30.0 - 36.0 g/dL   RDW 13.6  11.5 - 15.5 %   Platelets 129 (*) 150 - 400 K/uL  GLUCOSE, CAPILLARY     Status: None   Collection Time    01/26/14  7:43 AM      Result Value Ref Range   Glucose-Capillary 90  70 - 99 mg/dL   Comment 1 Notify RN    TROPONIN I     Status: None   Collection Time    01/26/14  9:45 AM      Result Value Ref Range   Troponin I <0.30  <0.30 ng/mL  GLUCOSE, CAPILLARY     Status: Abnormal   Collection Time    01/26/14 11:32 AM      Result Value Ref Range   Glucose-Capillary 150 (*) 70 - 99 mg/dL   Comment 1 Notify RN      Signed: Drucilla Schmidt, MD 01/26/2014, 3:35 PM    Services Ordered on Discharge: Home Health PT/RN Equipment Ordered on Discharge: none

## 2014-01-26 NOTE — Evaluation (Signed)
Occupational Therapy Evaluation Patient Details Name: Wyatt Salas MRN: [id number] DOB: 1951/02/01 Today's Date: 01/26/2014    History of Present Illness Wyatt Salas is a 63 y.o. Maleadmitted 01/25/14 with PMHx of Subdural hematoma s/p R sided craniotomy, DMII, seizure disorder, chronic Hepatitis C, HLD, HTN, chronic diastolic congestive heart failure stage 1 and h/o of NSTEMI who presents to the ED upon request of his friend for fall and altered mental status. Pt states he fell getting out of the shower 2-3 days ago   Clinical Impression   Pt admitted with above. He demonstrates the below listed deficits and will benefit from continued OT to maximize safety and independence with BADLs.  Pt is close to his baseline per his report, he does demonstrate generalized weakness and impaired balance.  Currently, he requires supervision for BADLs.  He has all DME.  Will follow while in hospital to allow him to discharge home at modified independent level.       Follow Up Recommendations  No OT follow up    Equipment Recommendations  None recommended by OT    Recommendations for Other Services       Precautions / Restrictions Precautions Precautions: Fall Restrictions Weight Bearing Restrictions: No      Mobility Bed Mobility Overal bed mobility: Modified Independent                Transfers Overall transfer level: Needs assistance Equipment used: Quad cane Transfers: Sit to/from [facility] Sit to Stand: Supervision Stand pivot transfers: Supervision       General transfer comment: moves slowly - requires extra time     Balance Overall balance assessment: Needs assistance Sitting-balance support: Feet supported Sitting balance-Leahy Scale: Good     Standing balance support: No upper extremity supported Standing balance-Leahy Scale: Fair Standing balance comment: leans into the counter at the sink while performing grooming and UB ADLs                             ADL Overall ADL's : Needs assistance/impaired Eating/Feeding: Modified independent   Grooming: Wash/dry hands;Wash/dry face;Oral care;Brushing hair;Supervision/safety;Standing   Upper Body Bathing: Set up;Sitting;Standing;Supervision/ safety   Lower Body Bathing: Sit to/from stand;Supervison/ safety   Upper Body Dressing : Set up;Sitting   Lower Body Dressing: Supervision/safety;Sit to/from stand   Toilet Transfer: Ambulation;Comfort height toilet;Supervision/safety   Toileting- Clothing Manipulation and Hygiene: Supervision/safety;Sit to/from stand       Functional mobility during ADLs: Cane;Supervision/safety General ADL Comments: Pt reports he feels his getting close to his baseline status.  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 - at baseline   Lower Extremity Assessment Lower Extremity Assessment: Defer to PT evaluation LLE Deficits / Details: decreased coordination,  LLE Coordination: decreased fine motor       Communication Communication Communication: No difficulties   Cognition Arousal/Alertness: Awake/alert Behavior During Therapy: WFL for tasks assessed/performed Overall Cognitive Status: History of cognitive impairments - at baseline  General Comments       Exercises       Shoulder Instructions      Home Living Family/patient expects to be discharged to:: Private residence Living Arrangements: Alone Available Help at Discharge: Friend(s) Type of Home: Apartment Home Access: Elevator     Home Layout: One level     Bathroom  Shower/Tub: Tub/shower unit Shower/tub characteristics: Architectural technologist: Standard     Home Equipment: [place] held shower head;Wheelchair - Comptroller - 2 wheels;Tub bench [facility] agency has switched his seat to a bench)          Prior Functioning/Environment Level of Independence: Independent with assistive device(s)        Comments: uses [employer] transportation, uses quad cane and Manual chair mostly    OT Diagnosis: Generalized weakness   OT Problem List: Decreased strength;Impaired balance (sitting and/or standing)   OT Treatment/Interventions: Self-care/ADL training;Therapeutic activities;Balance training;Patient/family education    OT Goals(Current goals can be found in the care plan section) Acute Rehab OT Goals Patient Stated Goal: to go home.  OT Goal Formulation: With patient Time For Goal Achievement: 02/02/14 Potential to Achieve Goals: Good ADL Goals Pt Will Perform Grooming: with modified independence;standing Pt Will Perform Upper Body Bathing: with modified independence;sitting;standing Pt Will Perform Lower Body Bathing: with modified independence;sit to/from stand Pt Will Perform Upper Body Dressing: with modified independence;sitting Pt Will Perform Lower Body Dressing: with modified independence;sit to/from stand Pt Will Transfer to Toilet: with modified independence;ambulating;regular height toilet;grab bars Pt Will Perform Toileting - Clothing Manipulation and hygiene: with modified independence;sit to/from stand Pt Will Perform Tub/Shower Transfer: Tub transfer;with supervision;tub bench;ambulating  OT Frequency: Min 2X/week   Barriers to D/C: Decreased caregiver support          Co-evaluation              End of Session Equipment Utilized During Treatment: Other (comment) (quad cane) Nurse Communication: Mobility status  Activity Tolerance: Patient tolerated treatment well Patient left: in bed;with call  bell/phone within reach;with bed alarm set   Time: 5573-2202 OT Time Calculation (min): 23 min Charges:  OT General Charges $OT Visit: 1 Procedure OT Evaluation $Initial OT Evaluation Tier I: 1 Procedure OT Treatments $Self Care/Home Management : 8-22 mins G-Codes: OT G-codes **NOT FOR INPATIENT CLASS** Functional Limitation: Self care Self Care Current Status (R4270): At least 1 percent but less than 20 percent impaired, limited or restricted Self Care Goal Status (W2376): At least 1 percent but less than 20 percent impaired, limited or restricted  Madalyn Legner M 01/26/2014, 12:06 PM

## 2016-10-14 ENCOUNTER — Observation Stay (HOSPITAL_COMMUNITY)
Admission: EM | Admit: 2016-10-14 | Discharge: 2016-10-15 | Disposition: A | Discharge status: Home Health | Payer: Medicare HMO | Attending: Internal Medicine | Admitting: Internal Medicine

## 2016-10-14 ENCOUNTER — Encounter (HOSPITAL_COMMUNITY): Payer: Self-pay | Admitting: *Deleted

## 2016-10-14 ENCOUNTER — Emergency Department (HOSPITAL_COMMUNITY): Payer: Medicare HMO

## 2016-10-14 DIAGNOSIS — R748 Abnormal levels of other serum enzymes: Secondary | ICD-10-CM | POA: Diagnosis not present

## 2016-10-14 DIAGNOSIS — Y92002 Bathroom of unspecified non-institutional (private) residence single-family (private) house as the place of occurrence of the external cause: Secondary | ICD-10-CM | POA: Insufficient documentation

## 2016-10-14 DIAGNOSIS — E1169 Type 2 diabetes mellitus with other specified complication: Secondary | ICD-10-CM

## 2016-10-14 DIAGNOSIS — R159 Full incontinence of feces: Secondary | ICD-10-CM | POA: Insufficient documentation

## 2016-10-14 DIAGNOSIS — I252 Old myocardial infarction: Secondary | ICD-10-CM | POA: Diagnosis not present

## 2016-10-14 DIAGNOSIS — I5042 Chronic combined systolic (congestive) and diastolic (congestive) heart failure: Secondary | ICD-10-CM | POA: Insufficient documentation

## 2016-10-14 DIAGNOSIS — N390 Urinary tract infection, site not specified: Secondary | ICD-10-CM | POA: Diagnosis present

## 2016-10-14 DIAGNOSIS — W050XXA Fall from non-moving wheelchair, initial encounter: Secondary | ICD-10-CM | POA: Insufficient documentation

## 2016-10-14 DIAGNOSIS — R32 Unspecified urinary incontinence: Secondary | ICD-10-CM | POA: Insufficient documentation

## 2016-10-14 DIAGNOSIS — W19XXXA Unspecified fall, initial encounter: Secondary | ICD-10-CM

## 2016-10-14 DIAGNOSIS — R778 Other specified abnormalities of plasma proteins: Secondary | ICD-10-CM

## 2016-10-14 DIAGNOSIS — I110 Hypertensive heart disease with heart failure: Secondary | ICD-10-CM | POA: Insufficient documentation

## 2016-10-14 DIAGNOSIS — E119 Type 2 diabetes mellitus without complications: Secondary | ICD-10-CM | POA: Diagnosis not present

## 2016-10-14 DIAGNOSIS — R2681 Unsteadiness on feet: Secondary | ICD-10-CM | POA: Insufficient documentation

## 2016-10-14 DIAGNOSIS — F17290 Nicotine dependence, other tobacco product, uncomplicated: Secondary | ICD-10-CM | POA: Insufficient documentation

## 2016-10-14 DIAGNOSIS — R531 Weakness: Secondary | ICD-10-CM | POA: Diagnosis not present

## 2016-10-14 DIAGNOSIS — G40909 Epilepsy, unspecified, not intractable, without status epilepticus: Secondary | ICD-10-CM | POA: Diagnosis not present

## 2016-10-14 DIAGNOSIS — Z79899 Other long term (current) drug therapy: Secondary | ICD-10-CM | POA: Insufficient documentation

## 2016-10-14 DIAGNOSIS — E785 Hyperlipidemia, unspecified: Secondary | ICD-10-CM | POA: Insufficient documentation

## 2016-10-14 DIAGNOSIS — Z791 Long term (current) use of non-steroidal anti-inflammatories (NSAID): Secondary | ICD-10-CM | POA: Diagnosis not present

## 2016-10-14 DIAGNOSIS — Z7984 Long term (current) use of oral hypoglycemic drugs: Secondary | ICD-10-CM | POA: Diagnosis not present

## 2016-10-14 DIAGNOSIS — R7989 Other specified abnormal findings of blood chemistry: Secondary | ICD-10-CM | POA: Diagnosis present

## 2016-10-14 LAB — CK TOTAL AND CKMB (NOT AT ARMC)
CK, MB: 9.3 ng/mL — ABNORMAL HIGH (ref 0.5–5.0)
Relative Index: 4.7 — ABNORMAL HIGH (ref 0.0–2.5)
Total CK: 200 U/L (ref 49–397)

## 2016-10-14 LAB — PHENYTOIN LEVEL, TOTAL

## 2016-10-14 LAB — BASIC METABOLIC PANEL
ANION GAP: 9 (ref 5–15)
BUN: 8 mg/dL (ref 6–20)
CO2: 28 mmol/L (ref 22–32)
Calcium: 10.7 mg/dL — ABNORMAL HIGH (ref 8.9–10.3)
Chloride: 100 mmol/L — ABNORMAL LOW (ref 101–111)
Creatinine, Ser: 1.05 mg/dL (ref 0.61–1.24)
GFR calc Af Amer: >60 mL/min (ref >60)
GLUCOSE: 139 mg/dL — AB (ref 65–99)
Potassium: 4.4 mmol/L (ref 3.5–5.1)
SODIUM: 137 mmol/L (ref 135–145)

## 2016-10-14 LAB — CBC
HCT: 41.2 % (ref 39.0–52.0)
Hemoglobin: 13.6 g/dL (ref 13.0–17.0)
MCH: 27.4 pg (ref 26.0–34.0)
MCHC: 33 g/dL (ref 30.0–36.0)
MCV: 82.9 fL (ref 78.0–100.0)
PLATELETS: 236 10*3/uL (ref 150–400)
RBC: 4.97 MIL/uL (ref 4.22–5.81)
RDW: 13.8 % (ref 11.5–15.5)
WBC: 10.1 10*3/uL (ref 4.0–10.5)

## 2016-10-14 LAB — URINALYSIS, ROUTINE W REFLEX MICROSCOPIC
Bilirubin Urine: NEGATIVE
Glucose, UA: NEGATIVE mg/dL
Hgb urine dipstick: NEGATIVE
KETONES UR: NEGATIVE mg/dL
NITRITE: POSITIVE — AB
PH: 7 (ref 5.0–8.0)
Protein, ur: NEGATIVE mg/dL
Specific Gravity, Urine: 1.011 (ref 1.005–1.030)

## 2016-10-14 LAB — I-STAT TROPONIN, ED
TROPONIN I, POC: 0.15 ng/mL — AB (ref 0.00–0.08)
Troponin i, poc: 0.48 ng/mL (ref 0.00–0.08)

## 2016-10-14 LAB — CBG MONITORING, ED: Glucose-Capillary: 111 mg/dL — ABNORMAL HIGH (ref 65–99)

## 2016-10-14 LAB — TROPONIN I: TROPONIN I: 0.87 ng/mL — AB (ref <0.03)

## 2016-10-14 MED ORDER — ONDANSETRON HCL 4 MG/2ML IJ SOLN: 4.0000 mg | Freq: Four times a day (QID) | INTRAMUSCULAR | Status: DC | PRN

## 2016-10-14 MED ORDER — GALANTAMINE HYDROBROMIDE 4 MG PO TABS
8.0000 mg | ORAL_TABLET | Freq: Two times a day (BID) | ORAL | Status: DC
  Administered 2016-10-15: 8 mg via ORAL
  Filled 2016-10-14 (×2): qty 2

## 2016-10-14 MED ORDER — ASPIRIN 81 MG PO CHEW
324.0000 mg | CHEWABLE_TABLET | Freq: Once | ORAL | Status: AC
  Administered 2016-10-14: 324 mg via ORAL
  Filled 2016-10-14: qty 4

## 2016-10-14 MED ORDER — HYDROCODONE-ACETAMINOPHEN 5-325 MG PO TABS
1.0000 | ORAL_TABLET | ORAL | Status: DC | PRN
  Administered 2016-10-14 – 2016-10-15 (×2): 1 via ORAL
  Filled 2016-10-14 (×2): qty 1

## 2016-10-14 MED ORDER — DEXTROSE 5 % IV SOLN
1.0000 g | INTRAVENOUS | Status: DC
  Administered 2016-10-14: 1 g via INTRAVENOUS
  Filled 2016-10-14 (×2): qty 10

## 2016-10-14 MED ORDER — ACETAMINOPHEN 325 MG PO TABS: 650.0000 mg | ORAL_TABLET | Freq: Four times a day (QID) | ORAL | Status: DC | PRN

## 2016-10-14 MED ORDER — PHENYTOIN SODIUM EXTENDED 100 MG PO CAPS
100.0000 mg | ORAL_CAPSULE | Freq: Two times a day (BID) | ORAL | Status: DC
  Administered 2016-10-14 – 2016-10-15 (×2): 100 mg via ORAL
  Filled 2016-10-14 (×2): qty 1

## 2016-10-14 MED ORDER — SODIUM CHLORIDE 0.9 % IV SOLN
INTRAVENOUS | Status: AC
  Administered 2016-10-14: 23:00:00 via INTRAVENOUS

## 2016-10-14 MED ORDER — SODIUM CHLORIDE 0.9% FLUSH
3.0000 mL | Freq: Two times a day (BID) | INTRAVENOUS | Status: DC
  Administered 2016-10-15: 3 mL via INTRAVENOUS

## 2016-10-14 MED ORDER — MIDODRINE HCL 2.5 MG PO TABS
2.5000 mg | ORAL_TABLET | Freq: Two times a day (BID) | ORAL | Status: DC
  Administered 2016-10-15: 2.5 mg via ORAL
  Filled 2016-10-14 (×2): qty 1

## 2016-10-14 MED ORDER — ASPIRIN EC 81 MG PO TBEC
81.0000 mg | DELAYED_RELEASE_TABLET | Freq: Every day | ORAL | Status: DC
  Administered 2016-10-14 – 2016-10-15 (×2): 81 mg via ORAL
  Filled 2016-10-14 (×2): qty 1

## 2016-10-14 MED ORDER — ATORVASTATIN CALCIUM 40 MG PO TABS: 40.0000 mg | ORAL_TABLET | Freq: Every day | ORAL | Status: DC

## 2016-10-14 MED ORDER — ONDANSETRON HCL 4 MG PO TABS: 4.0000 mg | ORAL_TABLET | Freq: Four times a day (QID) | ORAL | Status: DC | PRN

## 2016-10-14 MED ORDER — INSULIN ASPART 100 UNIT/ML ~~LOC~~ SOLN: 0.0000 [IU] | SUBCUTANEOUS | Status: DC

## 2016-10-14 MED ORDER — ACETAMINOPHEN 650 MG RE SUPP: 650.0000 mg | Freq: Four times a day (QID) | RECTAL | Status: DC | PRN

## 2016-10-14 MED ORDER — ENOXAPARIN SODIUM 40 MG/0.4ML ~~LOC~~ SOLN
40.0000 mg | SUBCUTANEOUS | Status: DC
  Administered 2016-10-14: 40 mg via SUBCUTANEOUS
  Filled 2016-10-14: qty 0.4

## 2016-10-14 MED ORDER — LEVETIRACETAM 500 MG PO TABS
500.0000 mg | ORAL_TABLET | Freq: Two times a day (BID) | ORAL | Status: DC
  Administered 2016-10-14 – 2016-10-15 (×2): 500 mg via ORAL
  Filled 2016-10-14 (×2): qty 1

## 2016-10-14 NOTE — ED Triage Notes (Signed)
Per EMS, pt from home fell out of wheelchair trying to get up to urinate. Pt has paralysis on left side. Pt has hx of subdural hematoma. Pt states he hit his head on floor. Pt denies loss of consciousness. Pt states pain feels similar to migraines he has had in the past.   Pt alert and oriented x 4,   CBG 226 BP 138/72 HR 102 RR 18

## 2016-10-14 NOTE — ED Notes (Signed)
Called 4th floor to inquire about bed

## 2016-10-14 NOTE — ED Provider Notes (Signed)
Pt's care turned over r to me by Avie Echevaria PAC at 5:30.  To summarize, Pt had a fall today prior to urinating.  Pt fell forward from a wheelchair.   It is unclear if fall was mechanical, seizure or syncopal.  Pt complains of some weaknessHe had left sided weakness from previous head injury which is baseline.   He had a nstemi in 2015 and he presented with similar symptoms.  Previous provider obtained Troponin which was 0.15.  Repeat troponin is 0.48.   Pt also had episode of urinary and fecal incontinence  which is also unusual.  I will consult hospitalist for admission.  I spoke to Dr. Darnell Level who will admit.  I consulted neurology who advised no change in seizure medications.  I spoke with cardiology who will consult due to increasing troponin. Fransico Meadow, PA-C 10/14/16 [place], PA-C 10/14/16 [place], PA-C 10/14/16 Stanwood Liu, MD 10/15/16 1145

## 2016-10-14 NOTE — ED Provider Notes (Signed)
[place] DEPT Provider Note   CSN: [id number] Arrival date & time: 10/14/16  1121     History   Chief Complaint Chief Complaint  Patient presents with  . Fall  . Headache    HPI Wyatt Salas is a 66 y.o. male presenting via EMS after a fall while attempting to transfer from toilet to wheelchair this morning. His home nurse called EMS. He has left-sided weakness at baseline. He denies syncope, lightheadedness prior to falling. He states that the chair slipped from under him. He hit his head on the floor. No loss of consciousness. He reports a right-sided headache that he has had before the fall and takes medications for. He states that he may be feeling a little more weak on the left side than usual. He reports being compliant with all medications and took his morning doses. Denies nausea, vomiting, dizziness, or numbness.    HPI  Past Medical History:  Diagnosis Date  . Diabetes mellitus without complication ([place])   . Diastolic dysfunction    a. ECHO 07/2013 EF 55-60% and grade 1 diastolic dysfunction  . NSTEMI (non-ST elevated myocardial infarction) ([place])    problem list 01/2014  . Rhabdomyolysis 01/2014  . Seizures ([place])   . Syncope 01/2014  . Traumatic brain injury [facility])     Patient Active Problem List   Diagnosis Date Noted  . Right sided weakness 10/14/2016  . Acute lower UTI 10/14/2016  . Elevated troponin 10/14/2016  . Type 2 diabetes mellitus with hyperlipidemia ([place]) 10/14/2016  . Fall at home--mechanical 01/26/2014  . Elevated CK 01/26/2014  . Seizure disorder ([place]) 07/26/2013  . NSTEMI (non-ST elevated myocardial infarction) ([place]) 07/26/2013  . Hyperlipidemia 07/26/2013  . Aspiration pneumonia ([place]) 07/26/2013  . Rhabdomyolysis 07/26/2013    Past Surgical History:  Procedure Laterality Date  . CRANIOTOMY         Home Medications    Prior to Admission medications   Medication Sig Start Date End Date Taking? Authorizing Provider    clotrimazole (LOTRIMIN) 1 % cream Apply 1 application topically 2 (two) times daily.   Yes Historical Provider, MD  docusate sodium (COLACE) 100 MG capsule Take 200 mg by mouth 2 (two) times daily.   Yes Historical Provider, MD  galantamine (RAZADYNE) 8 MG tablet Take 1 tablet (8 mg total) by mouth 2 (two) times daily with a meal. 07/31/13  Yes [name], MD  ibuprofen (ADVIL,MOTRIN) 200 MG tablet Take 400 mg by mouth every 6 (six) hours as needed for headache.   Yes Historical Provider, MD  levETIRAcetam (KEPPRA) 500 MG tablet Take 500 mg by mouth every 12 (twelve) hours.    Yes Historical Provider, MD  metFORMIN (GLUCOPHAGE) 500 MG tablet Take 500 mg by mouth 2 (two) times daily with a meal.    Yes Historical Provider, MD  midodrine (PROAMATINE) 2.5 MG tablet Take 2.5 mg by mouth 2 (two) times daily with a meal.   Yes Historical Provider, MD  naproxen (NAPROSYN) 500 MG tablet Take 500 mg by mouth 2 (two) times daily with a meal.    Yes Historical Provider, MD  phenytoin (DILANTIN) 100 MG ER capsule Take 100 mg by mouth 2 (two) times daily.   Yes Historical Provider, MD  simvastatin (ZOCOR) 80 MG tablet Take 40 mg by mouth daily.    Yes Historical Provider, MD    Family History History reviewed. No pertinent family history.  Social History Social History  Substance Use Topics  . Smoking status:  Current Every Day Smoker    Types: Cigars  . Smokeless tobacco: Never Used  . Alcohol use Yes     Comment: rare     Allergies   Patient has no known allergies.   Review of Systems Review of Systems  Constitutional: Negative for chills and fever.  HENT: Negative for ear pain and sore throat.   Eyes: Negative for pain and visual disturbance.  Respiratory: Negative for cough, choking, chest tightness, shortness of breath, wheezing and stridor.   Cardiovascular: Negative for chest pain, palpitations and leg swelling.  Gastrointestinal: Negative for abdominal distention, abdominal pain,  blood in stool, diarrhea, nausea and vomiting.  Genitourinary: Negative for dysuria, flank pain and hematuria.  Musculoskeletal: Negative for arthralgias, back pain, joint swelling, myalgias, neck pain and neck stiffness.  Skin: Negative for color change, pallor, rash and wound.  Neurological: Positive for weakness and headaches. Negative for dizziness, tremors, seizures, syncope, facial asymmetry, speech difficulty, light-headedness and numbness.     Physical Exam Updated Vital Signs BP 122/89   Pulse 88   Temp 98.1 F (36.7 C) (Oral)   Resp 20   SpO2 100%   Physical Exam  Constitutional: He is oriented to person, place, and time. He appears well-developed and well-nourished. No distress.  Patient is afebrile, non-toxic appearing and interactive. Sitting comfortably in bed in no acute distress.  HENT:  Head: Normocephalic and atraumatic.  Eyes: Conjunctivae are normal. Pupils are equal, round, and reactive to light.  Neck: Normal range of motion. Neck supple.  Cardiovascular: Normal rate, normal heart sounds and intact distal pulses.   No murmur heard. Irregular rhythm  Pulmonary/Chest: Effort normal and breath sounds normal. No respiratory distress. He has no wheezes. He has no rales. He exhibits no tenderness.  Abdominal: Soft. He exhibits no distension and no mass. There is no tenderness. There is no rebound and no guarding.  Musculoskeletal: He exhibits no edema or tenderness.  Neurological: He is alert and oriented to person, place, and time.  Neurologic Exam:  - Mental status: Patient is alert and cooperative. Fluent speech and words are clear. Coherent thought processes and insight is good. Patient is oriented x 4 to person, place, time and event.  - Cranial nerves:  CN III, IV, VI: pupils equally round, reactive to light both direct and conscensual and normal accommodation. Full extra-ocular movement.  CN VII : muscles of facial expression intact.  XII: tongue is midline  when protruded. Patient has strong grips but weaker on the left: 4/5 left, 5/5 right. He has decreased strength in his left arm with resisted elbow flexion. Active flexion at the hip bilaterally but decreased strength to resistance on the left.  Skin: Skin is warm and dry. He is not diaphoretic. No erythema. No pallor.  Psychiatric: He has a normal mood and affect.  Nursing note and vitals reviewed.    ED Treatments / Results  Labs (all labs ordered are listed, but only abnormal results are displayed) Labs Reviewed  BASIC METABOLIC PANEL - Abnormal; Notable for the following:       Result Value   Chloride 100 (*)    Glucose, Bld 139 (*)    Calcium 10.7 (*)    All other components within normal limits  PHENYTOIN LEVEL, TOTAL - Abnormal; Notable for the following:    Phenytoin Lvl <2.5 (*)    All other components within normal limits  URINALYSIS, ROUTINE W REFLEX MICROSCOPIC - Abnormal; Notable for the following:    APPearance  HAZY (*)    Nitrite POSITIVE (*)    Leukocytes, UA LARGE (*)    Bacteria, UA RARE (*)    Squamous Epithelial / LPF 0-5 (*)    All other components within normal limits  I-STAT TROPOININ, ED - Abnormal; Notable for the following:    Troponin i, poc 0.15 (*)    All other components within normal limits  I-STAT TROPOININ, ED - Abnormal; Notable for the following:    Troponin i, poc 0.48 (*)    All other components within normal limits  CBG MONITORING, ED - Abnormal; Notable for the following:    Glucose-Capillary 111 (*)    All other components within normal limits  CBC  CK TOTAL AND CKMB (NOT AT [facility])  CK TOTAL AND CKMB (NOT AT [facility])  Randolm Idol, ED    EKG  EKG Interpretation  Date/Time:  Wednesday October 14 2016 12:54:29 EDT Ventricular Rate:  97 PR Interval:  140 QRS Duration: 92 QT Interval:  362 QTC Calculation: 459 R Axis:   29 Text Interpretation:  Sinus bradycardia with frequent Premature ventricular complexes and Possible Premature  atrial complexes with Abberant conduction Minimal voltage criteria for LVH, may be normal variant Nonspecific ST and T wave abnormality Abnormal ECG Frequent PVCs new from prior, otherwise similar ECG to prior Confirmed by [facility] MD, [place] ([id number]) on 10/14/2016 4:00:10 PM       Radiology Dg Chest 2 View  Result Date: 10/14/2016 CLINICAL DATA:  [place] out of wheelchair hitting the head on floor EXAM: CHEST  2 VIEW COMPARISON:  Chest x-ray of 01/25/2014 FINDINGS: No active infiltrate or effusion is seen. The lungs are slightly hyperaerated. Mediastinal and hilar contours are unremarkable. The heart is within normal limits in size. Old healed right rib fractures are noted. IMPRESSION: No active cardiopulmonary disease.  Old healed right rib fractures. Electronically Signed   By: Ivar Drape M.D.   On: 10/14/2016 15:41   Ct Head Wo Contrast  Result Date: 10/14/2016 CLINICAL DATA:  [place] today getting out of a wheelchair. EXAM: CT HEAD WITHOUT CONTRAST TECHNIQUE: Contiguous axial images were obtained from the base of the skull through the vertex without intravenous contrast. COMPARISON:  01/25/2014 FINDINGS: Brain: Chronic encephalomalacia at in the right middle cerebral artery territory. Mild chronic small-vessel ischemic change of the hemispheric white matter otherwise. No sign of acute infarction, mass lesion, hemorrhage, hydrocephalus or extra-axial collection. Cerebellar atrophy. Vascular: There is atherosclerotic calcification of the major vessels at the base of the brain. Skull: Distant right craniotomy.  No acute calvarial finding. Sinuses/Orbits: Minimal mucosal thickening. No significant finding. Orbits negative. Other: None IMPRESSION: No acute or traumatic finding. Old right craniotomy. Atrophy and encephalomalacia in the right MCA territory. Cerebellar atrophy. Mild chronic small-vessel change of the deep white matter. Electronically Signed   By: Nelson Chimes M.D.   On: 10/14/2016 14:45     Procedures Procedures (including critical care time)  Medications Ordered in ED Medications  aspirin chewable tablet 324 mg (324 mg Oral Given 10/14/16 1612)     Initial Impression / Assessment and Plan / ED Course  I have reviewed the triage vital signs and the nursing notes.  Pertinent labs & imaging results that were available during my care of the patient were reviewed by me and considered in my medical decision making (see chart for details).     Patient with history of brain injury, left-sided weakness at baseline, non-stemi, seizures and DM. He is alert and oriented x  4 and responding to questions well. Although, he has been repetitive with loops of dialogue being the same throughout his workup referring to previous head injury and tick bites. He states that he is followed by a physician at [facility] and has a home nurse. It is unclear what his true baseline is.  He has no complaints other than a chronic headache that he reports having prior to the fall. Denies C/P, sob, or any other symptoms.  Based on patient hx and potential for him being a poor historian, ordered workup for potential syncope, cardiac origin or seizure vs slip and fall.  CT head negative for acute injury Initial troponin     0.15 Second troponin 0.48  Patient had an episode of fecal and bladder incontinence while in ED. Ordered phenytoin level which was subtherapeutic.  Suspect non-compliance with medications and patient needs admission for cardiology evaluation given positive delta troponin.  Transferred patient care at end of shift to [facility] pending call from admitting hospitatlist. Anticipate admission with cardiology eval.  Patient was discussed with Dr. Billy Fischer who agrees with assessment and plan. Final Clinical Impressions(s) / ED Diagnoses   Final diagnoses:  Fall, initial encounter  Elevated troponin    New Prescriptions New Prescriptions   No medications on file      Dossie Der 10/14/16 [place], MD 10/17/16 [phone]

## 2016-10-14 NOTE — ED Notes (Signed)
Unable to collect labs nurse is doing an EKG on a patient

## 2016-10-14 NOTE — ED Notes (Signed)
Blood draw attempt unsuccessful

## 2016-10-14 NOTE — Consult Note (Signed)
Primary cardiologist: n/a Consulting cardiologist: Dr Carlyle Dolly Requesting physician: Dr Roel Cluck  Clinical Summary Mr. 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 Cardiology is consulted for elevated troponin    Chart mentions prior NTEMI in Jan 2015. From chart review this happened in setting of rhabdo, pneumonia and sepsis. Peak troponin 1.12. No ischemic testing recommended at that time. Patient does not have a confirmed history of CAD.  Presents with fall. He states he drove his motorized wheel chair to restroom. As he went to stand up he fell to the floor. He was incontinent of urine and feces with fall. He is a limited historian and difficult to nail down the specifics of the episode. He denies any chest pain or SOB, no palpitations.    Echo Jan 2015: LVEF 74-25%, grade I diastolic dysfunction,  Hgb 12.2 Plt 236 K 4.4 Cr 1.05  CXR no acute process CT head: no acute process EKG SR, PVCs, no acute ischemic changes  No Known Allergies  Medications Scheduled Medications:    Infusions:    PRN Medications:     Past Medical History:  Diagnosis Date  . Diabetes mellitus without complication ([place])   . Diastolic dysfunction    a. ECHO 07/2013 EF 55-60% and grade 1 diastolic dysfunction  . NSTEMI (non-ST elevated myocardial infarction) ([place])    problem list 01/2014  . Rhabdomyolysis 01/2014  . Seizures ([place])   . Syncope 01/2014  . Traumatic brain injury [facility])     Past Surgical History:  Procedure Laterality Date  . CRANIOTOMY      History reviewed. No pertinent family history.  Social History Mr. Sambrano reports that he has been smoking Cigars.  He has never used smokeless tobacco. Mr. Robinette reports that he drinks alcohol.  Review of Systems CONSTITUTIONAL: No weight loss, fever, chills, weakness  or fatigue.  HEENT: Eyes: No visual loss, blurred vision, double vision or yellow sclerae. No hearing loss, sneezing, congestion, runny nose or sore throat.  SKIN: No rash or itching.  CARDIOVASCULAR: No chest pain, chest pressure or chest discomfort. No palpitations or edema.  RESPIRATORY: No shortness of breath, cough or sputum.  GASTROINTESTINAL: No anorexia, nausea, vomiting or diarrhea. No abdominal pain or blood.  GENITOURINARY: no polyuria, no dysuria NEUROLOGICAL: No headache, dizziness, syncope, paralysis, ataxia, numbness or tingling in the extremities. No change in bowel or bladder control.  MUSCULOSKELETAL: No muscle, back pain, joint pain or stiffness.  HEMATOLOGIC: No anemia, bleeding or bruising.  LYMPHATICS: No enlarged nodes. No history of splenectomy.  PSYCHIATRIC: No history of depression or anxiety.      Physical Examination Blood pressure 122/89, pulse 88, temperature 98.1 F (36.7 C), temperature source Oral, resp. rate 20, SpO2 100 %. No intake or output data in the 24 hours ending 10/14/16 1857  HEENT: sclera clear, throat clear  Cardiovascular: RRR, no m/r/g, no jvd  Respiratory: CTAB  GI: abdomen soft, NT, ND  MSK: no LE edema  Neuro: A&Ox 3  Psych: appropriate affect   Lab Results  Basic Metabolic Panel:  Recent Labs Lab 10/14/16 1303  NA 137  K 4.4  CL 100*  CO2 28  GLUCOSE 139*  BUN 8  CREATININE 1.05  CALCIUM 10.7*    Liver Function Tests: No results for input(s): AST, ALT, ALKPHOS, BILITOT, PROT, ALBUMIN in the last 168  hours.  CBC:  Recent Labs Lab 10/14/16 1303  WBC 10.1  HGB 13.6  HCT 41.2  MCV 82.9  PLT 236    Cardiac Enzymes: No results for input(s): CKTOTAL, CKMB, CKMBINDEX, TROPONINI in the last 168 hours.  BNP: Invalid input(s): POCBNP     Impression/Recommendations  1. Fall - difficult to discern by history whether this was a mechanical fall, orthostatic syncope, or possible seizure. Patient is a  limited historian. He did have urine and fecal incontinence - from cardiac standpoint he does have frequent PVCs on EKG. Doubt arrhythmia but monitor on tele overnight, repeat echo - check orthostatics if possible given his conditioning  2. Elevated troponin - nonspecific finding, patient with no cardiopulmonary symptoms. If he did have a seizure could be related - f/u echo and troponin trend. In absence of high risk findings would be hesitant for ischemic testing given his very poor functional status/conditioning. - ASA if ok from neuro standpoint, continue statin. Unless severe uptrend in trop above 1 or so I would not anticoagulate, would need to clear with neuro prior given his history of subdrual bleed though it was several years ago   Carlyle Dolly, M.D.

## 2016-10-14 NOTE — Progress Notes (Signed)
RN paged with elevated troponin of .84. Cardiology has already been consulted and NP reviewed note. Per note, cards did not recommend considering anticoagulation unless troponin over 1 and/or active symptoms. And even then, [facility] would have to be approved by neuro given pt's hx of SDH. Pt is not having CP or SOB. Will continue to cycle trops. KJKG, NP [facility]
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
## 2016-10-15 ENCOUNTER — Observation Stay (HOSPITAL_BASED_OUTPATIENT_CLINIC_OR_DEPARTMENT_OTHER): Payer: Medicare HMO

## 2016-10-15 DIAGNOSIS — R748 Abnormal levels of other serum enzymes: Secondary | ICD-10-CM

## 2016-10-15 DIAGNOSIS — R55 Syncope and collapse: Secondary | ICD-10-CM

## 2016-10-15 DIAGNOSIS — R531 Weakness: Secondary | ICD-10-CM | POA: Diagnosis not present

## 2016-10-15 DIAGNOSIS — G40909 Epilepsy, unspecified, not intractable, without status epilepticus: Secondary | ICD-10-CM | POA: Diagnosis not present

## 2016-10-15 DIAGNOSIS — M79605 Pain in left leg: Secondary | ICD-10-CM

## 2016-10-15 DIAGNOSIS — N390 Urinary tract infection, site not specified: Secondary | ICD-10-CM | POA: Diagnosis not present

## 2016-10-15 LAB — GLUCOSE, CAPILLARY
GLUCOSE-CAPILLARY: 94 mg/dL (ref 65–99)
GLUCOSE-CAPILLARY: 95 mg/dL (ref 65–99)
GLUCOSE-CAPILLARY: 117 mg/dL — AB (ref 65–99)
GLUCOSE-CAPILLARY: 75 mg/dL (ref 65–99)
Glucose-Capillary: 93 mg/dL (ref 65–99)

## 2016-10-15 LAB — MRSA PCR SCREENING: MRSA BY PCR: NEGATIVE

## 2016-10-15 LAB — CBC
HEMATOCRIT: 39.3 % (ref 39.0–52.0)
Hemoglobin: 13.1 g/dL (ref 13.0–17.0)
MCH: 27.5 pg (ref 26.0–34.0)
MCHC: 33.3 g/dL (ref 30.0–36.0)
MCV: 82.6 fL (ref 78.0–100.0)
Platelets: 185 10*3/uL (ref 150–400)
RBC: 4.76 MIL/uL (ref 4.22–5.81)
RDW: 13.8 % (ref 11.5–15.5)
WBC: 5.6 10*3/uL (ref 4.0–10.5)

## 2016-10-15 LAB — COMPREHENSIVE METABOLIC PANEL
ALBUMIN: 3.5 g/dL (ref 3.5–5.0)
ALK PHOS: 49 U/L (ref 38–126)
ALT: 34 U/L (ref 17–63)
AST: 36 U/L (ref 15–41)
Anion gap: 10 (ref 5–15)
BILIRUBIN TOTAL: 0.5 mg/dL (ref 0.3–1.2)
BUN: 7 mg/dL (ref 6–20)
CALCIUM: 9.8 mg/dL (ref 8.9–10.3)
CO2: 25 mmol/L (ref 22–32)
Chloride: 104 mmol/L (ref 101–111)
Creatinine, Ser: 0.8 mg/dL (ref 0.61–1.24)
GFR calc Af Amer: >60 mL/min (ref >60)
GFR calc non Af Amer: >60 mL/min (ref >60)
GLUCOSE: 93 mg/dL (ref 65–99)
POTASSIUM: 4 mmol/L (ref 3.5–5.1)
SODIUM: 139 mmol/L (ref 135–145)
TOTAL PROTEIN: 6.8 g/dL (ref 6.5–8.1)

## 2016-10-15 LAB — ECHOCARDIOGRAM COMPLETE
HEIGHTINCHES: 74 in
Weight: 2405.66 oz

## 2016-10-15 LAB — TSH: TSH: 1.944 u[IU]/mL (ref 0.350–4.500)

## 2016-10-15 LAB — PHOSPHORUS: Phosphorus: 3.3 mg/dL (ref 2.5–4.6)

## 2016-10-15 LAB — MAGNESIUM: Magnesium: 1.8 mg/dL (ref 1.7–2.4)

## 2016-10-15 LAB — TROPONIN I
Troponin I: 0.72 ng/mL (ref <0.03)
Troponin I: 0.47 ng/mL (ref <0.03)

## 2016-10-15 MED ORDER — PHENYTOIN SODIUM EXTENDED 30 MG PO CAPS: 30.0000 mg | ORAL_CAPSULE | Freq: Two times a day (BID) | ORAL | 0 refills | Status: AC

## 2016-10-15 MED ORDER — CEPHALEXIN 500 MG PO CAPS: 500.0000 mg | ORAL_CAPSULE | Freq: Two times a day (BID) | ORAL | 0 refills | Status: AC

## 2016-10-15 NOTE — Progress Notes (Signed)
  Echocardiogram 2D Echocardiogram has been performed.  Tresa Res 10/15/2016, 11:43 AM

## 2016-10-15 NOTE — Progress Notes (Signed)
Progress Note  Patient Name: Wyatt Salas Date of Encounter: 10/15/2016  Primary Cardiologist: New  Subjective   Feeling better this morning. No anginal symptoms, headache has improved.   Inpatient Medications    Scheduled Meds: . aspirin EC  81 mg Oral Daily  . atorvastatin  40 mg Oral q1800  . cefTRIAXone (ROCEPHIN) IVPB 1 gram/50 mL D5W  1 g Intravenous Q24H  . enoxaparin (LOVENOX) injection  40 mg Subcutaneous Q24H  . galantamine  8 mg Oral BID WC  . insulin aspart  0-9 Units Subcutaneous Q4H  . levETIRAcetam  500 mg Oral Q12H  . midodrine  2.5 mg Oral BID WC  . phenytoin  100 mg Oral BID  . sodium chloride flush  3 mL Intravenous Q12H   Continuous Infusions:  PRN Meds: acetaminophen **OR** acetaminophen, HYDROcodone-acetaminophen, ondansetron **OR** ondansetron (ZOFRAN) IV   Vital Signs    Vitals:   10/14/16 1924 10/14/16 2149 10/15/16 0227 10/15/16 0511  BP: 126/87 (!) 141/71 (!) 108/53 114/65  Pulse: 85 69 73 62  Resp: (!) 22 (!) '21 18 18  '$ Temp:  98.9 F (37.2 C) 98.3 F (36.8 C) 98.5 F (36.9 C)  TempSrc:  Oral Oral Oral  SpO2: 100% 100% 100% 100%  Weight:    150 lb 5.7 oz (68.2 kg)  Height:    '6\' 2"'$  (1.88 m)    Intake/Output Summary (Last 24 hours) at 10/15/16 2637 Last data filed at 10/15/16 0631  Gross per 24 hour  Intake            637.5 ml  Output              600 ml  Net             37.5 ml   Filed Weights   10/15/16 0511  Weight: 150 lb 5.7 oz (68.2 kg)    Telemetry    SR, PVCs - Personally Reviewed  ECG    SR with PVCs - Personally Reviewed  Physical Exam   General: Well developed, well nourished, male appearing in no acute distress. Head: Normocephalic, atraumatic.  Neck: Supple without bruits, JVD. Lungs:  Resp regular and unlabored, CTA. Heart: RRR, S1, S2, no S3, S4, or murmur; no rub. Abdomen: Soft, non-tender, non-distended with normoactive bowel sounds. No hepatomegaly. No rebound/guarding. No obvious abdominal  masses. Extremities: No clubbing, cyanosis, edema. Distal pedal pulses are 2+ bilaterally. Neuro: Alert and oriented X 3. Moves all extremities spontaneously. Psych: Normal affect.  Labs    Chemistry Recent Labs Lab 10/14/16 1303 10/15/16 0440  NA 137 139  K 4.4 4.0  CL 100* 104  CO2 28 25  GLUCOSE 139* 93  BUN 8 7  CREATININE 1.05 0.80  CALCIUM 10.7* 9.8  PROT  --  6.8  ALBUMIN  --  3.5  AST  --  36  ALT  --  34  ALKPHOS  --  49  BILITOT  --  0.5  GFRNONAA >60 >60  GFRAA >60 >60  ANIONGAP 9 10     Hematology Recent Labs Lab 10/14/16 1303 10/15/16 0440  WBC 10.1 5.6  RBC 4.97 4.76  HGB 13.6 13.1  HCT 41.2 39.3  MCV 82.9 82.6  MCH 27.4 27.5  MCHC 33.0 33.3  RDW 13.8 13.8  PLT 236 185    Cardiac Enzymes Recent Labs Lab 10/14/16 2152 10/15/16 0118 10/15/16 0440  TROPONINI 0.87* 0.72* 0.47*    Recent Labs Lab 10/14/16 1330 10/14/16 1713  TROPIPOC 0.15* 0.48*     BNPNo results for input(s): BNP, PROBNP in the last 168 hours.   DDimer No results for input(s): DDIMER in the last 168 hours.    Radiology    Dg Chest 2 View  Result Date: 10/14/2016 CLINICAL DATA:  [place] out of wheelchair hitting the head on floor EXAM: CHEST  2 VIEW COMPARISON:  Chest x-ray of 01/25/2014 FINDINGS: No active infiltrate or effusion is seen. The lungs are slightly hyperaerated. Mediastinal and hilar contours are unremarkable. The heart is within normal limits in size. Old healed right rib fractures are noted. IMPRESSION: No active cardiopulmonary disease.  Old healed right rib fractures. Electronically Signed   By: Ivar Drape M.D.   On: 10/14/2016 15:41   Ct Head Wo Contrast  Result Date: 10/14/2016 CLINICAL DATA:  [place] today getting out of a wheelchair. EXAM: CT HEAD WITHOUT CONTRAST TECHNIQUE: Contiguous axial images were obtained from the base of the skull through the vertex without intravenous contrast. COMPARISON:  01/25/2014 FINDINGS: Brain: Chronic encephalomalacia  at in the right middle cerebral artery territory. Mild chronic small-vessel ischemic change of the hemispheric white matter otherwise. No sign of acute infarction, mass lesion, hemorrhage, hydrocephalus or extra-axial collection. Cerebellar atrophy. Vascular: There is atherosclerotic calcification of the major vessels at the base of the brain. Skull: Distant right craniotomy.  No acute calvarial finding. Sinuses/Orbits: Minimal mucosal thickening. No significant finding. Orbits negative. Other: None IMPRESSION: No acute or traumatic finding. Old right craniotomy. Atrophy and encephalomalacia in the right MCA territory. Cerebellar atrophy. Mild chronic small-vessel change of the deep white matter. Electronically Signed   By: Nelson Chimes M.D.   On: 10/14/2016 14:45    Cardiac Studies   TTE: Pending  Patient Profile     66 y.o. male with PMH of subdural hematoma s/p right sided craniotomy (2000), DM, seizure, Hep C, chronic diastolic HF who was admitted after a fall.   Assessment & Plan    1. Elevated Trop: Enzyme peak at 0.87 with downward trend. No reported anginal symptoms or syncope prior to fall. Sounds mechanical. Would be a poor candidate for ischemic testing given his functional status -- echo pending   2. Fall: Reported he was in his wheelchair attempting to open the door and fell out of his chair. Denies any syncope. Does have seizure history, but reports he remembers the event. Has a HH RN, but would probably benefit from PT/OT while admitted.  -- telemetry shows freq PVCs, but no pauses and arrhthymias noted.   3. UTI: antibiotics per primary  Signed, Reino Bellis, NP  10/15/2016, 9:25 AM    I have seen, examined and evaluated the patient this AM along with Ms. Mancel Bale, NP.  After reviewing all the available data and chart, we discussed the patients laboratory, study & physical findings as well as symptoms in detail. I agree with her findings, examination as well as impression  recommendations as per our discussion.    Not sure why Troponin level was checked in the absence of any anginal or CHF Sx. Agree with Echo, but would not consider ischemic evaluation & no anticoagulation.  Agree with Dr Nelly Laurence initial assessment.     Glenetta Hew, M.D., M.S. Interventional Cardiologist   Pager # [phone] Phone # [phone] [address] [place], [place] [address]

## 2016-10-15 NOTE — Evaluation (Signed)
Physical Therapy Evaluation-1x Patient Details Name: Wyatt Salas MRN: [id number] DOB: 1951-06-02 Today's Date: 10/15/2016   History of Present Illness  66 year old male was admitted after a fall from his w/c.  Pt has a PMH significant for SDH with R crani, DM, seizure, hep C, CHF, and NSTEMI.    Clinical Impression  On eval, pt required Min guard-Min assist for mobility. He was able to perform a stand pivot transfer, bed to recliner, with a quad cane. Only assist given was to lower safely to low recliner. Pt participated well. He stated he is going home later today (RN already completed d/c instructions prior to PT eval). Recommend HHPT, home health aide, and 24/7 supervision initially until he returns to baseline.     Follow Up Recommendations Home health PT;Supervision/Assistance - 24 hour(initially until returns to baseline); Home Health Aide     Equipment Recommendations  None recommended by PT    Recommendations for Other Services OT consult     Precautions / Restrictions Precautions Precautions: Fall Restrictions Weight Bearing Restrictions: No      Mobility  Bed Mobility Overal bed mobility: Modified Independent Bed Mobility: Supine to Sit     Supine to sit: HOB elevated     General bed mobility comments: no physical assist given.   Transfers Overall transfer level: Needs assistance Equipment used: Quad cane Transfers: Sit to/from Stand Sit to Stand: Min assist Stand pivot transfers: Min guard       General transfer comment: very close guarding. Increased time. Setup recliner for transfer. Stand pivot transfer, bed to recliner, with use of quad cane (towards L side). Small amount of assist given during lowering to recliner.   Ambulation/Gait             General Gait Details: NT  Stairs            Wheelchair Mobility    Modified Rankin (Stroke Patients Only)       Balance Overall balance assessment: Needs assistance   Sitting  balance-Leahy Scale: Good     Standing balance support: Single extremity supported Standing balance-Leahy Scale: Poor Standing balance comment: requires external support                             Pertinent Vitals/Pain Pain Assessment: Faces Faces Pain Scale: Hurts even more Pain Location: L great toe Pain Descriptors / Indicators: Grimacing;Guarding Pain Intervention(s): Limited activity within patient's tolerance    Home Living Family/patient expects to be discharged to:: Private residence Living Arrangements: Alone Available Help at Discharge: Friend(s) Type of Home: Apartment       Home Layout: One level Home Equipment: Hand held shower head;Tub bench;Walker - 2 wheels;Cane - quad;Wheelchair - power;Wheelchair - manual      Prior Function Level of Independence: Independent with assistive device(s)         Comments: uses manual w/c mostly. Walks short distances with quad cane.  Sister gets groceries and RN comes every other week to help with meds     Hand Dominance        Extremity/Trunk Assessment   Upper Extremity Assessment Upper Extremity Assessment: Defer to OT evaluation LUE Deficits / Details: able to use LUE as assist to don socks; able to FF to approximately 45 and tip pinch demonstrated.  Incorporated arm without cues    Lower Extremity Assessment Lower Extremity Assessment: Generalized weakness (poor control L LE, especially knee)  Cervical / Trunk Assessment Cervical / Trunk Assessment: Normal  Communication   Communication: No difficulties  Cognition Arousal/Alertness: Awake/alert Behavior During Therapy: WFL for tasks assessed/performed Overall Cognitive Status: History of cognitive impairments - at baseline                                 General Comments: talkative.       General Comments      Exercises     Assessment/Plan    PT Assessment All further PT needs can be met in the next venue of care  (HHPT (pt already set to d/c home))  PT Problem List Decreased strength;Decreased mobility;Decreased activity tolerance;Decreased balance;Decreased knowledge of use of DME;Pain;Decreased cognition       PT Treatment Interventions      PT Goals (Current goals can be found in the Care Plan section)  Acute Rehab PT Goals Patient Stated Goal: none stated PT Goal Formulation: All assessment and education complete, DC therapy    Frequency     Barriers to discharge        Co-evaluation               End of Session Equipment Utilized During Treatment: Gait belt Activity Tolerance: Patient tolerated treatment well Patient left: in chair;with call bell/phone within reach;with chair alarm set   PT Visit Diagnosis: Muscle weakness (generalized) (M62.81);Difficulty in walking, not elsewhere classified (R26.2)    Time: 1751-0258 PT Time Calculation (min) (ACUTE ONLY): 23 min   Charges:   PT Evaluation $PT Eval Low Complexity: 1 Procedure     PT G Codes:   PT G-Codes **NOT FOR INPATIENT CLASS** Functional Assessment Tool Used: AM-PAC 6 Clicks Basic Mobility;Clinical judgement Functional Limitation: Mobility: Walking and moving around Mobility: Walking and Moving Around Current Status (N2778): At least 20 percent but less than 40 percent impaired, limited or restricted Mobility: Walking and Moving Around Goal Status [phone]): At least 1 percent but less than 20 percent impaired, limited or restricted Mobility: Walking and Moving Around Discharge Status [phone]): At least 20 percent but less than 40 percent impaired, limited or restricted      Weston Anna, MPT Pager: [phone]

## 2016-10-15 NOTE — Progress Notes (Signed)
CRITICAL VALUE ALERT  Critical value received:  Troponin-0.83   Date of notification:  10/14/16  Time of notification:  2305  Critical value read back:Yes.    Nurse who received alert:  Sheffield Slider   MD notified (1st page):  K.Kirby-NP  Time of first page:  2340  MD notified (2nd page):  Time of second page:  Responding MD:  K.Kirby-NP  Time MD responded:  2979

## 2016-10-15 NOTE — Care Management Obs Status (Signed)
MEDICARE OBSERVATION STATUS NOTIFICATION   Patient Details  Name: Wyatt Salas MRN: [id number] Date of Birth: 11/05/1950   Medicare Observation Status Notification Given:  Yes    MahabirJuliann Pulse, RN 10/15/2016, 11:33 AM

## 2016-10-15 NOTE — Care Management Note (Signed)
Case Management Note  Patient Details  Name: [name] MRN: [id number] Date of Birth: 11/23/1950  Subjective/Objective: [facility] rep-Tim aware of [place] orders. PTAR called for ambulance transp. No further CM needs.                   Action/Plan:d/c SNF.   Expected Discharge Date:  10/15/16               Expected Discharge Plan:  [place]  In-House Referral:     Discharge planning Services  CM Consult  Post Acute Care Choice:  Home Health ([place] nurse;w/c bound) Choice offered to:  [facility] POA / Guardian  DME Arranged:    DME Agency:     HH Arranged:  PT, Nurse's Aide, OT, RN [place] Agency:  [facility] (formerly [employer])  Status of Service:  Completed, signed off  If discussed at [employer], dates discussed:    Additional Comments:  Dessa Phi, RN 10/15/2016, 3:20 PM

## 2016-10-15 NOTE — Care Management Note (Signed)
Case Management Note  Patient Details  Name: CHRIST FULLENWIDER MRN: [id number] Date of Birth: 1950/10/30  Subjective/Objective:  66 y/o m admitted w/R sided weakness,fall. Hx: DM,subdural hematoma,seizure,chf,hep c. w/c bound. Patient already has [place] nurse from his [facility] benefit for med mgmnt.From  Home. Has HCPOA-Sharon Paul[place]. Per patient request-provided her w/HHC agency list as resource-chose [facility] if [place] needed. PT/OT cons-await recc. PTAR-Ambulance transportation needed @ d/c-confirmed address. Forms on shadow chart-nurse will call when ready.               Action/Plan:d/c home.   Expected Discharge Date:   (unknown)               Expected Discharge Plan:  [place]  In-House Referral:     Discharge planning Services  CM Consult  Post Acute Care Choice:  Home Health ([place] nurse;w/c bound) Choice offered to:  [facility] POA / Guardian  DME Arranged:    DME Agency:     HH Arranged:    [place] Agency:     Status of Service:  In process, will continue to follow  If discussed at Long Length of Stay Meetings, dates discussed:    Additional Comments:  Dessa Phi, RN 10/15/2016, 11:34 AM

## 2016-10-15 NOTE — Care Management Note (Signed)
Case Management Note  Patient Details  Name: Wyatt Salas MRN: [id number] Date of Birth: [birth year]  Subjective/Objective:  PT-recc HHPT, aide. [facility] rep Tim aware of d/c & HHC orders. Will provide PTAR for transportation home. No further CM needs.                  Action/Plan:d/c home w/HHC.   Expected Discharge Date:  10/15/16               Expected Discharge Plan:  [place]  In-House Referral:     Discharge planning Services  CM Consult  Post Acute Care Choice:  Home Health ([place] nurse;w/c bound) Choice offered to:  [facility] POA / Guardian  DME Arranged:    DME Agency:     HH Arranged:  PT, Nurse's Aide [place] Agency:  [facility] [employer]  Status of Service:  Completed, signed off  If discussed at [employer], dates discussed:    Additional Comments:  Dessa Phi, RN 10/15/2016, 2:56 PM

## 2016-10-15 NOTE — Evaluation (Signed)
Occupational Therapy Evaluation Patient Details Name: AAIDEN DEPOY MRN: [id number] DOB: Oct 26, 1950 Today's Date: 10/15/2016    History of Present Illness This 66 year old man was admitted after a fall from his w/c.  Pt has a PMH significant for SDH with R crani, DM, seizure, hep C, CHF, and NSTEMI.   Clinical Impression   Pt was admitted for the above.  He was mod I with adls at home, and had support for getting groceries and with medication management.  Pt currently needs min A at this time. Goals are for supervision to min guard level in acute.    Follow Up Recommendations  Supervision/Assistance - 24 hour;SNF (vs.  Recommend HHOT if home)    Equipment Recommendations  None recommended by OT    Recommendations for Other Services       Precautions / Restrictions        Mobility Bed Mobility Overal bed mobility: Needs Assistance Bed Mobility: Supine to Sit     Supine to sit: Min assist     General bed mobility comments: for trunk; pt held onto therapist's hand.  Encouraged him to try without assistance  Transfers Overall transfer level: Needs assistance Equipment used: 1 person hand held assist Transfers: Sit to/from [facility] Sit to Stand: Min assist Stand pivot transfers: Min assist       General transfer comment: steadying assistance.  Pushed up from footboard of bed    Balance Overall balance assessment: Needs assistance;History of Falls   Sitting balance-Leahy Scale: Good     Standing balance support: Single extremity supported Standing balance-Leahy Scale: Poor                             ADL either performed or assessed with clinical judgement   ADL Overall ADL's : Needs assistance/impaired Eating/Feeding: Independent;Sitting   Grooming: Set up;Sitting   Upper Body Bathing: Set up;Sitting   Lower Body Bathing: Minimal assistance;Sit to/from stand   Upper Body Dressing : Set up;Sitting   Lower Body Dressing:  Minimal assistance;Sit to/from stand   Toilet Transfer: Minimal assistance;Stand-pivot Toilet Transfer Details (indicate cue type and reason): to chair.  Used footboard of bed to stand Toileting- Water quality scientist and Hygiene: Supervision/safety;Sitting/lateral lean         General ADL Comments: When pt first stood, LLE buckled and foot slid.  Able to stand after this without difficulty. Pt c/o L great toe pain--blocked lateral aspect of foot when standing.  Encouraged use of urinal from sitting; pt initially wanted to stand     Vision         Perception     Praxis      Pertinent Vitals/Pain Pain Assessment: Faces Faces Pain Scale: Hurts whole lot Pain Location: L great toe Pain Intervention(s): Limited activity within patient's tolerance;Monitored during session;Repositioned     Hand Dominance     Extremity/Trunk Assessment Upper Extremity Assessment Upper Extremity Assessment: LUE deficits/detail LUE Deficits / Details: able to use LUE as assist to don socks; able to FF to approximately 45 and tip pinch demonstrated.  Incorporated arm without cues           Communication Communication Communication: No difficulties   Cognition Arousal/Alertness: Awake/alert Behavior During Therapy: WFL for tasks assessed/performed Overall Cognitive Status: No family/caregiver present to determine baseline cognitive functioning  General Comments: pt very distractible due to talking a lot.  On target with remembering channels.  Cues for safety for sitting to use urinal, ? safety vs awareness of deficits at this time   General Comments       Exercises     Shoulder Instructions      Home Living Family/patient expects to be discharged to:: Private residence Living Arrangements: Alone Available Help at Discharge: Friend(s) Type of Home: [place]: One level     Bathroom Shower/Tub: Animal nutritionist: [place]: Environmental consultant - 2 wheels;Cane - quad;Shower seat;Grab bars - toilet;Wheelchair - manual;Wheelchair - power          Prior Functioning/Environment Level of Independence: Independent with assistive device(s)        Comments: uses manual w/c.  Sister gets groceries and RN comes every other week to help with meds        OT Problem List: Decreased strength;Decreased activity tolerance;Impaired balance (sitting and/or standing);Decreased safety awareness      OT Treatment/Interventions: Self-care/ADL training;DME and/or AE instruction;Therapeutic activities;Patient/family education    OT Goals(Current goals can be found in the care plan section) Acute Rehab OT Goals Patient Stated Goal: none stated OT Goal Formulation: With patient Time For Goal Achievement: 10/22/16 Potential to Achieve Goals: Good ADL Goals Pt Will Perform Lower Body Dressing: with min guard assist;sit to/from stand Pt Will Transfer to Toilet: with min guard assist;stand pivot transfer;grab bars (comfort height) Additional ADL Goal #1: pt will gather clothes/adl supplies at supervision level from w/c  OT Frequency: Min 2X/week   Barriers to D/C:            Co-evaluation              End of Session Equipment Utilized During Treatment: Gait belt  Activity Tolerance: Patient tolerated treatment well Patient left: in chair;with call bell/phone within reach;with chair alarm set  OT Visit Diagnosis: Unsteadiness on feet (R26.81)                Time: 7116-5790 OT Time Calculation (min): 30 min Charges:    G-Codes: OT G-codes **NOT FOR INPATIENT CLASS** Functional Assessment Tool Used: Clinical judgement Functional Limitation: Self care Self Care Current Status (X8333): At least 20 percent but less than 40 percent impaired, limited or restricted Self Care Goal Status (O3291): At least 1 percent but less than 20 percent impaired, limited or restricted    [name], OTR/L [phone] 10/15/2016  Erasto Sleight 10/15/2016, 12:31 PM

## 2016-10-15 NOTE — Discharge Summary (Signed)
Physician Discharge Summary  CECILE GUEVARA YWV:[id number] DOB: 20-Dec-1950 DOA: 10/14/2016  PCP: Pcp Not In System  Admit date: 10/14/2016 Discharge date: 10/15/2016  Admitted From: Home  Disposition:  Home   Recommendations for Outpatient Follow-up:  1. Follow up with PCP in 1- week 2. Follow up Dilantin level in one week. 3. Dilantin has been increased to 130 mg twice daily for 1 week and then recheck a level. 4. Pending the echocardiogram report.  Home Health: Resume  Equipment/Devices: Wheelchair  Discharge Condition: Stable  CODE STATUS: Full  Diet recommendation: Carb Modified    Brief/Interim Summary: This is a 66 year old male wheelchair bound who presented to the hospital with the chief complaint of a fall. Apparently he was trying to open the door of his house when he slipped and fell, positive head trauma but no loss of consciousness. On initial physical examination he was afebrile, respiratory rate of 20, heart rate of 88, blood pressure 122/89. He was awake and alert, his lungs were clear to auscultation bilaterally, heart S1-S2 present rhythmic, lower extremities with no edema, abdomen was soft nontender, neurologically patient has a chronic left hemiparesis. Sodium 137, potassium 4.4, chloride 100, bicarbonate 20, glucose 139, BNP 8, creatinine 1.05, troponin 0.87, white count 10.1, hemoglobin 13.6, hematocrit 41.2, platelets 136, Dilantin level less than 2.5, normal TSH, urine analysis positive for infection with too numerous to count white cells, positive nitrates. Head CT negative for acute changes. Old right craniotomy. Atrophy and encephalomalacia in the right MCA territory. Cerebellar atrophy. Mild chronic small vessel change of the deep white matter. Chest x-ray negative for infiltrates,  healed right rib fractures. EKG sinus rhythm with PACs with aberrant conduction and PVCs.  The patient was admitted to the hospital with the working diagnosis of fall complicated by  urinary tract infection and elevated troponins.  1. Fall. No loss of consciousness or pedicle sensitive seizures. Patient will be discharged home to follow-up with home health services. Patient is wheelchair-bound. Preliminary report on ultrasound of the left lower extremity negative for DVT thrombosis.   2. Seizures. No clinical signs of seizures. Dilantin level less than 2.5. Case discussed with neurology over the phone, will increase Dilantin to 130 milligrams twice daily and follow-up level in 1 week. Continue keppra at current dose.   3. Urinary tract infection. Patient was placed on IV ceftriaxone, urine culture has been no growth. Old culture from 2011, positive for Escherichia coli sensitive to cephalosporins. Will discharge patient on cephalexin.   4. Elevated troponins. No signs of acute coronary syndrome, echocardiogram is pending, he was seen by cardiology, no further workup indicated. Chest pain-free.  5. Borderline diabetes. Continue lifestyle modifications and metformin.   Discharge Diagnoses:  Active Problems:   Seizure disorder (HCC)   Right sided weakness   Acute lower UTI   Elevated troponin   Type 2 diabetes mellitus with hyperlipidemia [facility])    Discharge Instructions   Allergies as of 10/15/2016      Reactions   Shellfish Allergy       Medication List    TAKE these medications   cephALEXin 500 MG capsule Commonly known as:  KEFLEX Take 1 capsule (500 mg total) by mouth 2 (two) times daily.   clotrimazole 1 % cream Commonly known as:  LOTRIMIN Apply 1 application topically 2 (two) times daily.   docusate sodium 100 MG capsule Commonly known as:  COLACE Take 200 mg by mouth 2 (two) times daily.   galantamine 8 MG tablet  Commonly known as:  RAZADYNE Take 1 tablet (8 mg total) by mouth 2 (two) times daily with a meal.   ibuprofen 200 MG tablet Commonly known as:  ADVIL,MOTRIN Take 400 mg by mouth every 6 (six) hours as needed for headache.    levETIRAcetam 500 MG tablet Commonly known as:  KEPPRA Take 500 mg by mouth every 12 (twelve) hours.   metFORMIN 500 MG tablet Commonly known as:  GLUCOPHAGE Take 500 mg by mouth 2 (two) times daily with a meal.   midodrine 2.5 MG tablet Commonly known as:  PROAMATINE Take 2.5 mg by mouth 2 (two) times daily with a meal.   naproxen 500 MG tablet Commonly known as:  NAPROSYN Take 500 mg by mouth 2 (two) times daily with a meal.   phenytoin 30 MG ER capsule Commonly known as:  DILANTIN Take 1 capsule (30 mg total) by mouth 2 (two) times daily. What changed:  You were already taking a medication with the same name, and this prescription was added. Make sure you understand how and when to take each.   phenytoin 100 MG ER capsule Commonly known as:  DILANTIN Take 100 mg by mouth 2 (two) times daily. What changed:  Another medication with the same name was added. Make sure you understand how and when to take each.   simvastatin 80 MG tablet Commonly known as:  ZOCOR Take 40 mg by mouth daily.       Allergies  Allergen Reactions  . Shellfish Allergy     Consultations:  Cardiology    Procedures/Studies: Dg Chest 2 View  Result Date: 10/14/2016 CLINICAL DATA:  [place] out of wheelchair hitting the head on floor EXAM: CHEST  2 VIEW COMPARISON:  Chest x-ray of 01/25/2014 FINDINGS: No active infiltrate or effusion is seen. The lungs are slightly hyperaerated. Mediastinal and hilar contours are unremarkable. The heart is within normal limits in size. Old healed right rib fractures are noted. IMPRESSION: No active cardiopulmonary disease.  Old healed right rib fractures. Electronically Signed   By: Ivar Drape M.D.   On: 10/14/2016 15:41   Ct Head Wo Contrast  Result Date: 10/14/2016 CLINICAL DATA:  [place] today getting out of a wheelchair. EXAM: CT HEAD WITHOUT CONTRAST TECHNIQUE: Contiguous axial images were obtained from the base of the skull through the vertex without intravenous  contrast. COMPARISON:  01/25/2014 FINDINGS: Brain: Chronic encephalomalacia at in the right middle cerebral artery territory. Mild chronic small-vessel ischemic change of the hemispheric white matter otherwise. No sign of acute infarction, mass lesion, hemorrhage, hydrocephalus or extra-axial collection. Cerebellar atrophy. Vascular: There is atherosclerotic calcification of the major vessels at the base of the brain. Skull: Distant right craniotomy.  No acute calvarial finding. Sinuses/Orbits: Minimal mucosal thickening. No significant finding. Orbits negative. Other: None IMPRESSION: No acute or traumatic finding. Old right craniotomy. Atrophy and encephalomalacia in the right MCA territory. Cerebellar atrophy. Mild chronic small-vessel change of the deep white matter. Electronically Signed   By: Nelson Chimes M.D.   On: 10/14/2016 14:45       Subjective: Patient feeling better, no nausea or vomiting, no abdominal pain. Positive pain on the right first toe.   Discharge Exam: Vitals:   10/15/16 0227 10/15/16 0511  BP: (!) 108/53 114/65  Pulse: 73 62  Resp: 18 18  Temp: 98.3 F (36.8 C) 98.5 F (36.9 C)   Vitals:   10/14/16 1924 10/14/16 2149 10/15/16 0227 10/15/16 0511  BP: 126/87 (!) 141/71 (!) 108/53 114/65  Pulse: 85 69 73 62  Resp: (!) 22 (!) '21 18 18  '$ Temp:  98.9 F (37.2 C) 98.3 F (36.8 C) 98.5 F (36.9 C)  TempSrc:  Oral Oral Oral  SpO2: 100% 100% 100% 100%  Weight:    68.2 kg (150 lb 5.7 oz)  Height:    '6\' 2"'$  (1.88 m)    General: Pt is alert, awake, not in acute distress Cardiovascular: RRR, S1/S2 +, no rubs, no gallops Respiratory: CTA bilaterally, no wheezing, no rhonchi Abdominal: Soft, NT, ND, bowel sounds + Extremities: no edema, no cyanosis    The results of significant diagnostics from this hospitalization (including imaging, microbiology, ancillary and laboratory) are listed below for reference.     Microbiology: Recent Results (from the past 240  hour(s))  MRSA PCR Screening     Status: None   Collection Time: 10/15/16  5:17 AM  Result Value Ref Range Status   MRSA by PCR NEGATIVE NEGATIVE Final    Comment:        The GeneXpert MRSA Assay (FDA approved for NASAL specimens only), is one component of a comprehensive MRSA colonization surveillance program. It is not intended to diagnose MRSA infection nor to guide or monitor treatment for MRSA infections.      Labs: BNP (last 3 results) No results for input(s): BNP in the last 8760 hours. Basic Metabolic Panel:  Recent Labs Lab 10/14/16 1303 10/15/16 0440  NA 137 139  K 4.4 4.0  CL 100* 104  CO2 28 25  GLUCOSE 139* 93  BUN 8 7  CREATININE 1.05 0.80  CALCIUM 10.7* 9.8  MG  --  1.8  PHOS  --  3.3   Liver Function Tests:  Recent Labs Lab 10/15/16 0440  AST 36  ALT 34  ALKPHOS 49  BILITOT 0.5  PROT 6.8  ALBUMIN 3.5   No results for input(s): LIPASE, AMYLASE in the last 168 hours. No results for input(s): AMMONIA in the last 168 hours. CBC:  Recent Labs Lab 10/14/16 1303 10/15/16 0440  WBC 10.1 5.6  HGB 13.6 13.1  HCT 41.2 39.3  MCV 82.9 82.6  PLT 236 185   Cardiac Enzymes:  Recent Labs Lab 10/14/16 1657 10/14/16 2152 10/15/16 0118 10/15/16 0440  CKTOTAL 200  --   --   --   CKMB 9.3*  --   --   --   TROPONINI  --  0.87* 0.72* 0.47*   BNP: Invalid input(s): POCBNP CBG:  Recent Labs Lab 10/14/16 2244 10/15/16 0103 10/15/16 0502 10/15/16 0801 10/15/16 1135  GLUCAP 93 94 95 117* 75   D-Dimer No results for input(s): DDIMER in the last 72 hours. Hgb A1c No results for input(s): HGBA1C in the last 72 hours. Lipid Profile No results for input(s): CHOL, HDL, LDLCALC, TRIG, CHOLHDL, LDLDIRECT in the last 72 hours. Thyroid function studies  Recent Labs  10/15/16 0440  TSH 1.944   Anemia work up No results for input(s): VITAMINB12, FOLATE, FERRITIN, TIBC, IRON, RETICCTPCT in the last 72 hours. Urinalysis    Component Value  Date/Time   COLORURINE YELLOW 10/14/2016 1756   APPEARANCEUR HAZY (A) 10/14/2016 1756   LABSPEC 1.011 10/14/2016 1756   PHURINE 7.0 10/14/2016 1756   GLUCOSEU NEGATIVE 10/14/2016 1756   HGBUR NEGATIVE 10/14/2016 1756   BILIRUBINUR NEGATIVE 10/14/2016 1756   KETONESUR NEGATIVE 10/14/2016 1756   PROTEINUR NEGATIVE 10/14/2016 1756   UROBILINOGEN 1.0 01/25/2014 1700   NITRITE POSITIVE (A) 10/14/2016 1756   LEUKOCYTESUR LARGE (  A) 10/14/2016 1756   Sepsis Labs Invalid input(s): PROCALCITONIN,  WBC,  LACTICIDVEN Microbiology Recent Results (from the past 240 hour(s))  MRSA PCR Screening     Status: None   Collection Time: 10/15/16  5:17 AM  Result Value Ref Range Status   MRSA by PCR NEGATIVE NEGATIVE Final    Comment:        The GeneXpert MRSA Assay (FDA approved for NASAL specimens only), is one component of a comprehensive MRSA colonization surveillance program. It is not intended to diagnose MRSA infection nor to guide or monitor treatment for MRSA infections.      Time coordinating discharge: 45 minutes  SIGNED:   Tawni Millers, MD  [facility] 10/15/2016, 1:01 PM Pager   If 7PM-7AM, please contact night-coverage [email or web address] Password [email or web address]

## 2016-10-15 NOTE — Progress Notes (Signed)
VASCULAR LAB PRELIMINARY  PRELIMINARY  PRELIMINARY  PRELIMINARY  Left lower extremity venous duplex completed.    Preliminary report:  Left:  No evidence of DVT, superficial thrombosis, or Baker's cyst.  Bijon Mineer, RVT 10/15/2016, 1:02 PM

## 2016-10-16 LAB — URINE CULTURE

## 2016-10-16 LAB — HEMOGLOBIN A1C
HEMOGLOBIN A1C: 6 % — AB (ref 4.8–5.6)
MEAN PLASMA GLUCOSE: 126 mg/dL

## 2017-03-11 ENCOUNTER — Emergency Department (HOSPITAL_COMMUNITY)
Admission: EM | Admit: 2017-03-11 | Discharge: 2017-03-11 | Disposition: A | Discharge status: Home / Self Care | Payer: Medicare HMO | Attending: Emergency Medicine | Admitting: Emergency Medicine

## 2017-03-11 ENCOUNTER — Encounter (HOSPITAL_COMMUNITY): Payer: Self-pay | Admitting: *Deleted

## 2017-03-11 ENCOUNTER — Emergency Department (HOSPITAL_COMMUNITY): Payer: Medicare HMO

## 2017-03-11 DIAGNOSIS — Z87891 Personal history of nicotine dependence: Secondary | ICD-10-CM | POA: Diagnosis not present

## 2017-03-11 DIAGNOSIS — Z7984 Long term (current) use of oral hypoglycemic drugs: Secondary | ICD-10-CM | POA: Insufficient documentation

## 2017-03-11 DIAGNOSIS — R531 Weakness: Secondary | ICD-10-CM | POA: Diagnosis present

## 2017-03-11 DIAGNOSIS — I214 Non-ST elevation (NSTEMI) myocardial infarction: Secondary | ICD-10-CM | POA: Diagnosis not present

## 2017-03-11 DIAGNOSIS — E1159 Type 2 diabetes mellitus with other circulatory complications: Secondary | ICD-10-CM | POA: Diagnosis not present

## 2017-03-11 DIAGNOSIS — Z79899 Other long term (current) drug therapy: Secondary | ICD-10-CM | POA: Insufficient documentation

## 2017-03-11 LAB — RAPID URINE DRUG SCREEN, HOSP PERFORMED
Amphetamines: NOT DETECTED
Barbiturates: NOT DETECTED
Benzodiazepines: NOT DETECTED
Cocaine: NOT DETECTED
OPIATES: NOT DETECTED
Tetrahydrocannabinol: POSITIVE — AB

## 2017-03-11 LAB — I-STAT CHEM 8, ED
BUN: 10 mg/dL (ref 6–20)
CREATININE: 0.8 mg/dL (ref 0.61–1.24)
Calcium, Ion: 1.22 mmol/L (ref 1.15–1.40)
Chloride: 103 mmol/L (ref 101–111)
GLUCOSE: 83 mg/dL (ref 65–99)
HCT: 42 % (ref 39.0–52.0)
HEMOGLOBIN: 14.3 g/dL (ref 13.0–17.0)
POTASSIUM: 3.9 mmol/L (ref 3.5–5.1)
Sodium: 141 mmol/L (ref 135–145)
TCO2: 28 mmol/L (ref 0–100)

## 2017-03-11 LAB — URINALYSIS, ROUTINE W REFLEX MICROSCOPIC
Bilirubin Urine: NEGATIVE
GLUCOSE, UA: NEGATIVE mg/dL
HGB URINE DIPSTICK: NEGATIVE
KETONES UR: NEGATIVE mg/dL
NITRITE: POSITIVE — AB
PROTEIN: NEGATIVE mg/dL
Specific Gravity, Urine: 1.011 (ref 1.005–1.030)
Squamous Epithelial / LPF: NONE SEEN
pH: 5 (ref 5.0–8.0)

## 2017-03-11 LAB — DIFFERENTIAL
BASOS ABS: 0 10*3/uL (ref 0.0–0.1)
Basophils Relative: 0 %
EOS ABS: 0.4 10*3/uL (ref 0.0–0.7)
Eosinophils Relative: 5 %
LYMPHS ABS: 3.4 10*3/uL (ref 0.7–4.0)
LYMPHS PCT: 50 %
MONOS PCT: 8 %
Monocytes Absolute: 0.6 10*3/uL (ref 0.1–1.0)
NEUTROS ABS: 2.6 10*3/uL (ref 1.7–7.7)
Neutrophils Relative %: 37 %

## 2017-03-11 LAB — COMPREHENSIVE METABOLIC PANEL
ALBUMIN: 4.2 g/dL (ref 3.5–5.0)
ALK PHOS: 48 U/L (ref 38–126)
ALT: 24 U/L (ref 17–63)
AST: 26 U/L (ref 15–41)
Anion gap: 5 (ref 5–15)
BILIRUBIN TOTAL: 0.4 mg/dL (ref 0.3–1.2)
BUN: 10 mg/dL (ref 6–20)
CO2: 30 mmol/L (ref 22–32)
CREATININE: 0.83 mg/dL (ref 0.61–1.24)
Calcium: 9.7 mg/dL (ref 8.9–10.3)
Chloride: 108 mmol/L (ref 101–111)
GFR calc Af Amer: >60 mL/min (ref >60)
GLUCOSE: 87 mg/dL (ref 65–99)
Potassium: 3.9 mmol/L (ref 3.5–5.1)
Sodium: 143 mmol/L (ref 135–145)
TOTAL PROTEIN: 7.8 g/dL (ref 6.5–8.1)

## 2017-03-11 LAB — ETHANOL: Alcohol, Ethyl (B): <5 mg/dL (ref <5)

## 2017-03-11 LAB — PROTIME-INR
INR: 1.01
Prothrombin Time: 13.3 seconds (ref 11.4–15.2)

## 2017-03-11 LAB — CBC
HEMATOCRIT: 39.1 % (ref 39.0–52.0)
HEMOGLOBIN: 12.8 g/dL — AB (ref 13.0–17.0)
MCH: 28.5 pg (ref 26.0–34.0)
MCHC: 32.7 g/dL (ref 30.0–36.0)
MCV: 87.1 fL (ref 78.0–100.0)
Platelets: 173 10*3/uL (ref 150–400)
RBC: 4.49 MIL/uL (ref 4.22–5.81)
RDW: 13.1 % (ref 11.5–15.5)
WBC: 7 10*3/uL (ref 4.0–10.5)

## 2017-03-11 LAB — I-STAT TROPONIN, ED: Troponin i, poc: 0 ng/mL (ref 0.00–0.08)

## 2017-03-11 LAB — APTT: APTT: 30 s (ref 24–36)

## 2017-03-11 NOTE — Discharge Instructions (Signed)
Make sure that you are getting plenty of rest, and drinking a lot of fluids.  Take your medicines as usual.  See your doctor for a checkup, next week.

## 2017-03-11 NOTE — ED Notes (Signed)
Code stroke cancelled. Per Dr Eulis Foster.

## 2017-03-11 NOTE — ED Triage Notes (Signed)
Per EMS, pt from home complains of increased left sided weakness and numbness and headache since 9AM this morning. 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 Stroke screen negative. Pt transfers to wheelchair without assistance.   BP 152/84 HR 88 RR 16 CBG 90 98% on RA

## 2017-03-11 NOTE — ED Notes (Signed)
Bed: WA11 Expected date:  Expected time:  Means of arrival:  Comments: EMS 

## 2017-03-11 NOTE — ED Provider Notes (Signed)
[facility] DEPT Provider Note   CSN: [id number] Arrival date & time: 03/11/17  1427     History   Chief Complaint Chief Complaint  Patient presents with  . Extremity Weakness    HPI Wyatt Salas is a 66 y.o. male.  He presents for evaluation of left-sided weakness, greater than usual, upon awakening this morning.  He first noticed this as he tried to get out of bed.  He denies other recent changes in his medical status.  There is been no fever, chills, cough, chest pain, change in bowel or urinary habits.  He is taking his usual medications.  He typically walks using a walker.  All of his care is done, at the [facility] in [place], [place].  There are no other no modifying factors.   HPI  Past Medical History:  Diagnosis Date  . Diabetes mellitus without complication ([place])   . Diastolic dysfunction    a. ECHO 07/2013 EF 55-60% and grade 1 diastolic dysfunction  . NSTEMI (non-ST elevated myocardial infarction) ([place])    problem list 01/2014  . Rhabdomyolysis 01/2014  . Seizures ([place])   . Syncope 01/2014  . Traumatic brain injury [facility])     Patient Active Problem List   Diagnosis Date Noted  . Right sided weakness 10/14/2016  . Acute lower UTI 10/14/2016  . Elevated troponin 10/14/2016  . Type 2 diabetes mellitus with hyperlipidemia ([place]) 10/14/2016  . Fall at home--mechanical 01/26/2014  . Elevated CK 01/26/2014  . Seizure disorder ([place]) 07/26/2013  . NSTEMI (non-ST elevated myocardial infarction) ([place]) 07/26/2013  . Hyperlipidemia 07/26/2013  . Aspiration pneumonia ([place]) 07/26/2013  . Rhabdomyolysis 07/26/2013    Past Surgical History:  Procedure Laterality Date  . CRANIOTOMY         Home Medications    Prior to Admission medications   Medication Sig Start Date End Date Taking? Authorizing Provider  acetaminophen (TYLENOL) 500 MG tablet Take 1,000 mg by mouth every 12 (twelve) hours as needed for mild pain or fever.   Yes [provider]  carbamide peroxide (DEBROX) 6.5 % OTIC solution Place 5-10 drops into both ears every other day.    Yes [provider]  carboxymethylcellulose (REFRESH PLUS) 0.5 % SOLN Place 1 drop into both eyes 2 (two) times daily as needed (Dry eyes).   Yes [provider]  docusate sodium (COLACE) 100 MG capsule Take 200 mg by mouth 2 (two) times daily.   Yes [provider]  ENSURE PLUS (ENSURE PLUS) LIQD Take 237 mLs by mouth 2 (two) times daily between meals.   Yes [provider]  galantamine (RAZADYNE ER) 16 MG 24 hr capsule Take 16 mg by mouth daily with breakfast.   Yes [provider]  hydrophilic ointment Apply 1 application topically 2 (two) times daily as needed for dry skin.   Yes [provider]  ibuprofen (ADVIL,MOTRIN) 200 MG tablet Take 400 mg by mouth 2 (two) times daily as needed for headache.    Yes [provider]  lacosamide (VIMPAT) 50 MG TABS tablet Take 100 mg by mouth 2 (two) times daily.   Yes [provider]  levETIRAcetam (KEPPRA) 750 MG tablet Take 1,500 mg by mouth 2 (two) times daily.   Yes [provider]  lisinopril (PRINIVIL,ZESTRIL) 5 MG tablet Take 5 mg by mouth daily.   Yes [provider]  metFORMIN (GLUCOPHAGE) 500 MG tablet Take 500 mg by mouth daily with breakfast.  Yes [provider]  miconazole (MICOTIN) 2 % powder Apply 1 application topically every other day. Applies to feet and under arms after showering   Yes [provider]  Multiple Vitamin (MULTIVITAMIN WITH MINERALS) TABS tablet Take 1 tablet by mouth daily.   Yes [provider]  simvastatin (ZOCOR) 80 MG tablet Take 40 mg by mouth at bedtime.    Yes [provider]  SOFOSBUVIR-VELPATASVIR PO Take 1 tablet by mouth 2 (two) times daily with a meal. Brand name: Epclusa 400mg -100mg  tablet.   Yes [provider]  galantamine (RAZADYNE) 8 MG tablet Take 1 tablet  (8 mg total) by mouth 2 (two) times daily with a meal. Patient not taking: Reported on 03/11/2017 07/31/13   [name], MD  phenytoin (DILANTIN) 30 MG ER capsule Take 1 capsule (30 mg total) by mouth 2 (two) times daily. Patient not taking: Reported on 03/11/2017 10/15/16 10/22/16  Arrien, [name], MD    Family History Family History  Problem Relation Age of Onset  . Headache Mother   . Diabetes Neg Hx   . Stroke Neg Hx     Social History Social History  Substance Use Topics  . Smoking status: Former Smoker    Types: Cigars  . Smokeless tobacco: Never Used  . Alcohol use Yes     Comment: rare     Allergies   Shellfish allergy   Review of Systems Review of Systems  All other systems reviewed and are negative.    Physical Exam Updated Vital Signs BP 112/74 (BP Location: Right Arm)   Pulse (!) 54   Temp 97.7 F (36.5 C) (Oral)   Resp 16   SpO2 100%   Physical Exam  Constitutional: He is oriented to person, place, and time. He appears well-developed. No distress.  Frail, appears older than stated age.  HENT:  Head: Normocephalic and atraumatic.  Right Ear: External ear normal.  Left Ear: External ear normal.  Eyes: Pupils are equal, round, and reactive to light. Conjunctivae and EOM are normal.  Neck: Normal range of motion and phonation normal. Neck supple.  Cardiovascular: Normal rate, regular rhythm and normal heart sounds.   Pulmonary/Chest: Effort normal and breath sounds normal. He exhibits no bony tenderness.  Abdominal: Soft. There is no tenderness.  Musculoskeletal: Normal range of motion.  Decreased grip strength left hand.  Able to extend left leg off the stretcher, but drops after 5 seconds.  Neurological: He is alert and oriented to person, place, and time. No cranial nerve deficit or sensory deficit. He exhibits normal muscle tone. Coordination normal.  No dysarthria, aphasia or nystagmus.  No pronator drift.  No truncal ataxia.  Skin: Skin  is warm, dry and intact.  Psychiatric: He has a normal mood and affect. His behavior is normal. Judgment and thought content normal.  Nursing note and vitals reviewed.    ED Treatments / Results  Labs (all labs ordered are listed, but only abnormal results are displayed) Labs Reviewed  CBC - Abnormal; Notable for the following:       Result Value   Hemoglobin 12.8 (*)    All other components within normal limits  RAPID URINE DRUG SCREEN, HOSP PERFORMED - Abnormal; Notable for the following:    Tetrahydrocannabinol POSITIVE (*)    All other components within normal limits  URINALYSIS, ROUTINE W REFLEX MICROSCOPIC - Abnormal; Notable for the following:    APPearance CLOUDY (*)    Nitrite POSITIVE (*)  Leukocytes, UA LARGE (*)    Bacteria, UA FEW (*)    All other components within normal limits  ETHANOL  PROTIME-INR  APTT  DIFFERENTIAL  COMPREHENSIVE METABOLIC PANEL  I-STAT CHEM 8, ED  I-STAT TROPONIN, ED    EKG  EKG Interpretation  Date/Time:  Thursday March 11 2017 14:50:24 EDT Ventricular Rate:  62 PR Interval:    QRS Duration: 100 QT Interval:  431 QTC Calculation: 438 R Axis:   51 Text Interpretation:  Sinus rhythm Probable anteroseptal infarct, old Since last tracing PVCs have resolved Confirmed by Daleen Bo [phone]) on 03/11/2017 4:28:28 PM       Radiology Ct Head Wo Contrast  Result Date: 03/11/2017 CLINICAL DATA:  66 y/o M; increased left-sided weakness and numbness. History of seizure disorder. EXAM: CT HEAD WITHOUT CONTRAST TECHNIQUE: Contiguous axial images were obtained from the base of the skull through the vertex without intravenous contrast. COMPARISON:  10/14/2016 CT of the head. FINDINGS: Brain: No evidence of acute infarction, hemorrhage, hydrocephalus, extra-axial collection or mass lesion/mass effect. The right MCA distribution encephalomalacia from prior infarction is stable. Severe atrophy of cerebellum, probably related to chronic outlet  therapy. Background of chronic microvascular ischemic changes and parenchymal volume loss of the brain is stable. Vascular: No hyperdense vessel. Extensive calcific atherosclerosis of carotid siphons. Skull: Normal. Negative for fracture or focal lesion. Sinuses/Orbits: Moderate ethmoid sinus mucosal thickening. Orbits are unremarkable. Normal aeration of mastoid air cells. Other: None. IMPRESSION: 1. No acute intracranial abnormality identified. 2. Chronic right MCA distribution infarction. 3. Severe cerebellar atrophy, likely related to chronic viral uptake therapy. 4. Moderate ethmoid sinus disease. Electronically Signed   By: Kristine Garbe M.D.   On: 03/11/2017 16:25    Procedures Procedures (including critical care time)  Medications Ordered in ED Medications - No data to display   Initial Impression / Assessment and Plan / ED Course  I have reviewed the triage vital signs and the nursing notes.  Pertinent labs & imaging results that were available during my care of the patient were reviewed by me and considered in my medical decision making (see chart for details).      Patient Vitals for the past 24 hrs:  BP Temp Temp src Pulse Resp SpO2  03/11/17 1443 112/74 97.7 F (36.5 C) Oral (!) 54 16 100 %    6:33 PM Reevaluation with update and discussion. After initial assessment and treatment, an updated evaluation reveals he remains comfortable, there is no change in his clinical status.  Findings discussed with the patient, and all questions answered. Barbette Mcglaun L     Final Clinical Impressions(s) / ED Diagnoses   Final diagnoses:  Weakness   Specific weakness, with reassuring evaluation.  Doubt new CVA, serious bacterial infection, metabolic instability or impending vascular collapse.  Nursing Notes Reviewed/ Care Coordinated Applicable Imaging Reviewed Interpretation of Laboratory Data incorporated into ED treatment  The patient appears reasonably screened  and/or stabilized for discharge and I doubt any other medical condition or other [facility] requiring further screening, evaluation, or treatment in the ED at this time prior to discharge.  Plan: Home Medications-continue usual; Home Treatments-rest, increase oral fluids; return here if the recommended treatment, does not improve the symptoms; Recommended follow up-PCP checkup 1 week and as needed.   New Prescriptions New Prescriptions   No medications on file     Daleen Bo, MD 03/11/17 [phone]

## 2017-05-18 ENCOUNTER — Emergency Department (HOSPITAL_COMMUNITY): Payer: Medicare HMO

## 2017-05-18 ENCOUNTER — Inpatient Hospital Stay (HOSPITAL_COMMUNITY)
Admission: EM | Admit: 2017-05-18 | Discharge: 2017-05-24 | DRG: 689 | Disposition: A | Discharge status: Skilled Nursing Facility | Payer: Medicare HMO | Attending: Internal Medicine | Admitting: Internal Medicine

## 2017-05-18 ENCOUNTER — Encounter (HOSPITAL_COMMUNITY): Payer: Self-pay

## 2017-05-18 DIAGNOSIS — E860 Dehydration: Secondary | ICD-10-CM | POA: Diagnosis present

## 2017-05-18 DIAGNOSIS — R918 Other nonspecific abnormal finding of lung field: Secondary | ICD-10-CM | POA: Diagnosis present

## 2017-05-18 DIAGNOSIS — E119 Type 2 diabetes mellitus without complications: Secondary | ICD-10-CM | POA: Diagnosis present

## 2017-05-18 DIAGNOSIS — I110 Hypertensive heart disease with heart failure: Secondary | ICD-10-CM | POA: Diagnosis present

## 2017-05-18 DIAGNOSIS — R4182 Altered mental status, unspecified: Secondary | ICD-10-CM | POA: Diagnosis not present

## 2017-05-18 DIAGNOSIS — R945 Abnormal results of liver function studies: Secondary | ICD-10-CM | POA: Diagnosis present

## 2017-05-18 DIAGNOSIS — M79662 Pain in left lower leg: Secondary | ICD-10-CM | POA: Diagnosis present

## 2017-05-18 DIAGNOSIS — I5032 Chronic diastolic (congestive) heart failure: Secondary | ICD-10-CM | POA: Diagnosis present

## 2017-05-18 DIAGNOSIS — R2689 Other abnormalities of gait and mobility: Secondary | ICD-10-CM | POA: Diagnosis present

## 2017-05-18 DIAGNOSIS — I10 Essential (primary) hypertension: Secondary | ICD-10-CM

## 2017-05-18 DIAGNOSIS — F1010 Alcohol abuse, uncomplicated: Secondary | ICD-10-CM | POA: Diagnosis not present

## 2017-05-18 DIAGNOSIS — R569 Unspecified convulsions: Secondary | ICD-10-CM | POA: Diagnosis not present

## 2017-05-18 DIAGNOSIS — T796XXA Traumatic ischemia of muscle, initial encounter: Secondary | ICD-10-CM | POA: Diagnosis present

## 2017-05-18 DIAGNOSIS — G9341 Metabolic encephalopathy: Secondary | ICD-10-CM | POA: Diagnosis present

## 2017-05-18 DIAGNOSIS — W010XXA Fall on same level from slipping, tripping and stumbling without subsequent striking against object, initial encounter: Secondary | ICD-10-CM | POA: Diagnosis present

## 2017-05-18 DIAGNOSIS — N390 Urinary tract infection, site not specified: Principal | ICD-10-CM | POA: Diagnosis present

## 2017-05-18 DIAGNOSIS — Z87820 Personal history of traumatic brain injury: Secondary | ICD-10-CM

## 2017-05-18 DIAGNOSIS — E876 Hypokalemia: Secondary | ICD-10-CM | POA: Diagnosis present

## 2017-05-18 DIAGNOSIS — Y92009 Unspecified place in unspecified non-institutional (private) residence as the place of occurrence of the external cause: Secondary | ICD-10-CM

## 2017-05-18 DIAGNOSIS — M79672 Pain in left foot: Secondary | ICD-10-CM | POA: Diagnosis present

## 2017-05-18 DIAGNOSIS — G40909 Epilepsy, unspecified, not intractable, without status epilepticus: Secondary | ICD-10-CM | POA: Diagnosis present

## 2017-05-18 DIAGNOSIS — Z7984 Long term (current) use of oral hypoglycemic drugs: Secondary | ICD-10-CM

## 2017-05-18 DIAGNOSIS — I252 Old myocardial infarction: Secondary | ICD-10-CM

## 2017-05-18 DIAGNOSIS — K7689 Other specified diseases of liver: Secondary | ICD-10-CM | POA: Diagnosis not present

## 2017-05-18 DIAGNOSIS — B957 Other staphylococcus as the cause of diseases classified elsewhere: Secondary | ICD-10-CM | POA: Diagnosis present

## 2017-05-18 DIAGNOSIS — Z87891 Personal history of nicotine dependence: Secondary | ICD-10-CM | POA: Diagnosis not present

## 2017-05-18 DIAGNOSIS — K7200 Acute and subacute hepatic failure without coma: Secondary | ICD-10-CM | POA: Diagnosis not present

## 2017-05-18 DIAGNOSIS — I2510 Atherosclerotic heart disease of native coronary artery without angina pectoris: Secondary | ICD-10-CM | POA: Diagnosis present

## 2017-05-18 DIAGNOSIS — Z789 Other specified health status: Secondary | ICD-10-CM | POA: Diagnosis not present

## 2017-05-18 LAB — COMPREHENSIVE METABOLIC PANEL
ALK PHOS: 72 U/L (ref 38–126)
ALT: 78 U/L — ABNORMAL HIGH (ref 17–63)
ANION GAP: 18 — AB (ref 5–15)
AST: 162 U/L — ABNORMAL HIGH (ref 15–41)
Albumin: 5.3 g/dL — ABNORMAL HIGH (ref 3.5–5.0)
BILIRUBIN TOTAL: 1.1 mg/dL (ref 0.3–1.2)
BUN: 36 mg/dL — ABNORMAL HIGH (ref 6–20)
CALCIUM: 11.2 mg/dL — AB (ref 8.9–10.3)
CO2: 25 mmol/L (ref 22–32)
Chloride: 102 mmol/L (ref 101–111)
Creatinine, Ser: 0.97 mg/dL (ref 0.61–1.24)
GFR calc Af Amer: >60 mL/min (ref >60)
Glucose, Bld: 137 mg/dL — ABNORMAL HIGH (ref 65–99)
POTASSIUM: 4.3 mmol/L (ref 3.5–5.1)
Sodium: 145 mmol/L (ref 135–145)
TOTAL PROTEIN: 10.6 g/dL — AB (ref 6.5–8.1)

## 2017-05-18 LAB — CBC WITH DIFFERENTIAL/PLATELET
BASOS ABS: 0 10*3/uL (ref 0.0–0.1)
Basophils Relative: 0 %
Eosinophils Absolute: 0 10*3/uL (ref 0.0–0.7)
Eosinophils Relative: 0 %
HEMATOCRIT: 48.5 % (ref 39.0–52.0)
Hemoglobin: 16.2 g/dL (ref 13.0–17.0)
LYMPHS ABS: 0.9 10*3/uL (ref 0.7–4.0)
LYMPHS PCT: 12 %
MCH: 28.9 pg (ref 26.0–34.0)
MCHC: 33.4 g/dL (ref 30.0–36.0)
MCV: 86.6 fL (ref 78.0–100.0)
MONO ABS: 0.3 10*3/uL (ref 0.1–1.0)
MONOS PCT: 4 %
NEUTROS ABS: 6.1 10*3/uL (ref 1.7–7.7)
Neutrophils Relative %: 84 %
Platelets: 231 10*3/uL (ref 150–400)
RBC: 5.6 MIL/uL (ref 4.22–5.81)
RDW: 12.9 % (ref 11.5–15.5)
WBC: 7.3 10*3/uL (ref 4.0–10.5)

## 2017-05-18 LAB — URINALYSIS, ROUTINE W REFLEX MICROSCOPIC
Bilirubin Urine: NEGATIVE
GLUCOSE, UA: NEGATIVE mg/dL
KETONES UR: 20 mg/dL — AB
Nitrite: POSITIVE — AB
PH: 5 (ref 5.0–8.0)
PROTEIN: 100 mg/dL — AB
SQUAMOUS EPITHELIAL / LPF: NONE SEEN
Specific Gravity, Urine: 1.017 (ref 1.005–1.030)

## 2017-05-18 LAB — RAPID URINE DRUG SCREEN, HOSP PERFORMED
AMPHETAMINES: NOT DETECTED
BENZODIAZEPINES: NOT DETECTED
Barbiturates: NOT DETECTED
Cocaine: NOT DETECTED
OPIATES: NOT DETECTED
TETRAHYDROCANNABINOL: NOT DETECTED

## 2017-05-18 LAB — CBG MONITORING, ED: Glucose-Capillary: 127 mg/dL — ABNORMAL HIGH (ref 65–99)

## 2017-05-18 LAB — CK: Total CK: 2035 U/L — ABNORMAL HIGH (ref 49–397)

## 2017-05-18 LAB — TROPONIN I: Troponin I: 0.05 ng/mL (ref <0.03)

## 2017-05-18 LAB — ETHANOL

## 2017-05-18 MED ORDER — LACOSAMIDE 50 MG PO TABS
100.0000 mg | ORAL_TABLET | Freq: Two times a day (BID) | ORAL | Status: DC
  Administered 2017-05-18 – 2017-05-24 (×12): 100 mg via ORAL
  Filled 2017-05-18 (×12): qty 2

## 2017-05-18 MED ORDER — IOPAMIDOL (ISOVUE-370) INJECTION 76%
INTRAVENOUS | Status: AC
  Administered 2017-05-18: 20:00:00
  Filled 2017-05-18: qty 100

## 2017-05-18 MED ORDER — THIAMINE HCL 100 MG/ML IJ SOLN
Freq: Once | INTRAVENOUS | Status: AC
  Administered 2017-05-18: via INTRAVENOUS
  Filled 2017-05-18: qty 1000

## 2017-05-18 MED ORDER — ADULT MULTIVITAMIN W/MINERALS CH
1.0000 | ORAL_TABLET | Freq: Every day | ORAL | Status: DC
  Administered 2017-05-19 – 2017-05-24 (×6): 1 via ORAL
  Filled 2017-05-18 (×6): qty 1

## 2017-05-18 MED ORDER — ENSURE ENLIVE PO LIQD
237.0000 mL | Freq: Two times a day (BID) | ORAL | Status: DC
  Administered 2017-05-19 – 2017-05-24 (×10): 237 mL via ORAL

## 2017-05-18 MED ORDER — PHENYTOIN SODIUM EXTENDED 30 MG PO CAPS
30.0000 mg | ORAL_CAPSULE | Freq: Two times a day (BID) | ORAL | Status: DC
Start: 2017-05-18 — End: 2017-05-24
  Administered 2017-05-18 – 2017-05-24 (×12): 30 mg via ORAL
  Filled 2017-05-18 (×13): qty 1

## 2017-05-18 MED ORDER — POLYVINYL ALCOHOL 1.4 % OP SOLN: 1.0000 [drp] | Freq: Two times a day (BID) | OPHTHALMIC | Status: DC | PRN

## 2017-05-18 MED ORDER — ENOXAPARIN SODIUM 40 MG/0.4ML ~~LOC~~ SOLN
40.0000 mg | Freq: Every day | SUBCUTANEOUS | Status: DC
  Administered 2017-05-18 – 2017-05-23 (×6): 40 mg via SUBCUTANEOUS
  Filled 2017-05-18 (×6): qty 0.4

## 2017-05-18 MED ORDER — SODIUM CHLORIDE 0.9 % IV BOLUS (SEPSIS)
1000.0000 mL | Freq: Once | INTRAVENOUS | Status: AC
  Administered 2017-05-18: 1000 mL via INTRAVENOUS

## 2017-05-18 MED ORDER — GALANTAMINE HYDROBROMIDE 4 MG PO TABS
8.0000 mg | ORAL_TABLET | Freq: Two times a day (BID) | ORAL | Status: DC
  Administered 2017-05-19 – 2017-05-24 (×11): 8 mg via ORAL
  Filled 2017-05-18 (×12): qty 2

## 2017-05-18 MED ORDER — IOPAMIDOL (ISOVUE-370) INJECTION 76%
100.0000 mL | Freq: Once | INTRAVENOUS | Status: AC | PRN
  Administered 2017-05-18: 100 mL via INTRAVENOUS

## 2017-05-18 MED ORDER — DEXTROSE 5 % IV SOLN
1.0000 g | INTRAVENOUS | Status: DC
  Administered 2017-05-19 – 2017-05-21 (×3): 1 g via INTRAVENOUS
  Filled 2017-05-18 (×4): qty 10

## 2017-05-18 MED ORDER — DOCUSATE SODIUM 100 MG PO CAPS
200.0000 mg | ORAL_CAPSULE | Freq: Two times a day (BID) | ORAL | Status: DC
  Administered 2017-05-18 – 2017-05-24 (×9): 200 mg via ORAL
  Filled 2017-05-18 (×11): qty 2

## 2017-05-18 MED ORDER — DEXTROSE 5 % IV SOLN
1.0000 g | Freq: Once | INTRAVENOUS | Status: AC
  Administered 2017-05-18: 1 g via INTRAVENOUS
  Filled 2017-05-18: qty 10

## 2017-05-18 MED ORDER — ATORVASTATIN CALCIUM 40 MG PO TABS
40.0000 mg | ORAL_TABLET | Freq: Every day | ORAL | Status: DC
  Administered 2017-05-19 – 2017-05-24 (×6): 40 mg via ORAL
  Filled 2017-05-18 (×6): qty 1

## 2017-05-18 MED ORDER — LEVETIRACETAM 500 MG PO TABS
1500.0000 mg | ORAL_TABLET | Freq: Two times a day (BID) | ORAL | Status: DC
  Administered 2017-05-18 – 2017-05-24 (×12): 1500 mg via ORAL
  Filled 2017-05-18 (×12): qty 3

## 2017-05-18 MED ORDER — ASPIRIN 81 MG PO CHEW
324.0000 mg | CHEWABLE_TABLET | Freq: Once | ORAL | Status: AC
  Administered 2017-05-18: 324 mg via ORAL
  Filled 2017-05-18: qty 4

## 2017-05-18 MED ORDER — LISINOPRIL 5 MG PO TABS
5.0000 mg | ORAL_TABLET | Freq: Every day | ORAL | Status: DC
  Administered 2017-05-19 – 2017-05-24 (×6): 5 mg via ORAL
  Filled 2017-05-18 (×6): qty 1

## 2017-05-18 MED ORDER — METFORMIN HCL 500 MG PO TABS: 500.0000 mg | ORAL_TABLET | Freq: Every day | ORAL | Status: DC

## 2017-05-18 NOTE — Care Management Note (Signed)
Case Management Note  Per Tim with [facility], pt is active with [facility] RN.

## 2017-05-18 NOTE — H&P (Signed)
TRH H&P   Patient Demographics:    Wyatt Salas, is a 66 y.o. male  MRN: [id number]   DOB - May 18, 1951  Admit Date - 05/18/2017  Outpatient Primary MD for the patient is [name], MD  Referring MD/NP/PA: Sherwood Gambler  Outpatient Specialists:   Patient coming from: home  Chief Complaint  Patient presents with  . Altered Mental Status      HPI:    Wyatt Salas  is a 66 y.o. male, w hx of CAD , Dm2, Seizure apparently c/o slipped and fell down in the kitchen and had difficulty getting up.  Is not sure how long he was on the ground.  Nurses who were filling his medication found him.  Pt was brought to ED for evaluation.   In ED,   Wbc 7.3, Hgb 16.2, Plt 231 Na 145, K 4.3, Bun 36, Creatinine 0.97 Calcium 11.2,   Alb 5.3 Ast 162, Alt 78, Alk phos 72, T. Bili 1.1  Trop 0.05 CPK 2,035  Etoh <10  ua Wbc tntc   CXR  IMPRESSION: Query 2.7 x 3.5 cm RIGHT upper lobe mass. CT chest with contrast recommended.  Considerable colonic distention under the RIGHT and LEFT hemidiaphragms, simulating pneumoperitoneum. Bowel obstruction cannot be excluded on this chest radiograph. Consider flat and erect abdominal films.  CT brain  IMPRESSION: 1. No acute intracranial trauma. 2. Postprocedural encephalomalacia in the RIGHT temporal lobe unchanged. 3. No cervical spine fracture. 4. Multilevel disc osteophytic disease. 5. Focal kyphosis in the mid cervical spine similar to comparison exam.   CT chest IMPRESSION: No evidence of pulmonary embolus.  Spiculated 3.6 cm right upper lobe soft tissue mass, highly suspicious for primary lung malignancy, with associated right hilar and paratracheal lymphadenopathy. 9 mm soft tissue nodule along the posterior right upper lobe segmental bronchus may represent a satellite nodule versus a peribronchial lymph  node.  Hazy ground-glass opacities surround the soft tissue mass and may represent direct extension of disease versus surrounding inflammatory changes.  Nodular and ground-glass opacities throughout the right lower lobe and right middle lobe may also represent distal spread of disease.  Pt according to ER seems altered, and will be admitted for AMS secondary to UTI, and mild rhabdomyolysis and lung mass       Review of systems:    In addition to the HPI above, No Fever-chills, No Headache, No changes with Vision or hearing, No problems swallowing food or Liquids, No Chest pain, Cough or Shortness of Breath, No Abdominal pain, No Nausea or Vommitting, Bowel movements are regular, No Blood in stool or Urine, No dysuria, No new skin rashes or bruises, No new joints pains-aches,  No new weakness, tingling, numbness in any extremity, No recent weight gain or loss, No polyuria, polydypsia or polyphagia, No significant Mental Stressors.  A full 10 point Review of Systems was done, except as  stated above, all other Review of Systems were negative.   With Past History of the following :    Past Medical History:  Diagnosis Date  . Diabetes mellitus without complication ([place])   . Diastolic dysfunction    a. ECHO 07/2013 EF 55-60% and grade 1 diastolic dysfunction  . NSTEMI (non-ST elevated myocardial infarction) ([place])    problem list 01/2014  . Rhabdomyolysis 01/2014  . Seizures ([place])   . Syncope 01/2014  . Traumatic brain injury [facility])       Past Surgical History:  Procedure Laterality Date  . CRANIOTOMY        Social History:     Social History  Substance Use Topics  . Smoking status: Former Smoker    Types: Cigars  . Smokeless tobacco: Never Used  . Alcohol use Yes     Comment: rare     Lives - at home  Mobility - walks by self   Family History :     Family History  Problem Relation Age of Onset  . Headache Mother   . Diabetes Neg Hx   . Stroke  Neg Hx       Home Medications:   Prior to Admission medications   Medication Sig Start Date End Date Taking? Authorizing Provider  acetaminophen (TYLENOL) 500 MG tablet Take 1,000 mg by mouth every 12 (twelve) hours as needed for mild pain or fever.   Yes [provider]  carbamide peroxide (DEBROX) 6.5 % OTIC solution Place 5-10 drops into both ears every other day.    Yes [provider]  carboxymethylcellulose (REFRESH PLUS) 0.5 % SOLN Place 1 drop into both eyes 2 (two) times daily as needed (Dry eyes).   Yes [provider]  docusate sodium (COLACE) 100 MG capsule Take 200 mg by mouth 2 (two) times daily.   Yes [provider]  ENSURE PLUS (ENSURE PLUS) LIQD Take 237 mLs by mouth 2 (two) times daily between meals.   Yes [provider]  galantamine (RAZADYNE) 8 MG tablet Take 1 tablet (8 mg total) by mouth 2 (two) times daily with a meal. 07/31/13  Yes [name], MD  ibuprofen (ADVIL,MOTRIN) 200 MG tablet Take 200 mg by mouth 2 (two) times daily as needed for headache.    Yes [provider]  lacosamide (VIMPAT) 50 MG TABS tablet Take 100 mg by mouth 2 (two) times daily.   Yes [provider]  levETIRAcetam (KEPPRA) 750 MG tablet Take 1,500 mg by mouth 2 (two) times daily.   Yes [provider]  lisinopril (PRINIVIL,ZESTRIL) 5 MG tablet Take 5 mg by mouth daily.   Yes [provider]  metFORMIN (GLUCOPHAGE) 500 MG tablet Take 500 mg by mouth daily with breakfast.    Yes [provider]  Multiple Vitamin (MULTIVITAMIN WITH MINERALS) TABS tablet Take 1 tablet by mouth daily.   Yes [provider]  phenytoin (DILANTIN) 30 MG ER capsule Take 1 capsule (30 mg total) by mouth 2 (two) times daily. 10/15/16 05/18/17 Yes Arrien, [name], MD  simvastatin (ZOCOR) 80 MG tablet Take 40 mg by mouth at bedtime.    Yes [provider]     Allergies:     Allergies  Allergen Reactions   . Shellfish Allergy Rash     Physical Exam:   Vitals  Blood pressure 111/74, pulse 89, temperature 98.8 F (37.1 C), temperature source Rectal, resp. rate 12, height _0  (1.854 m), weight 71.2 kg (157  lb), SpO2 100 %.   1. General  lying in bed in NAD,    2. Normal affect and insight, Not Suicidal or Homicidal, Awake Alert, Oriented X 3.  3. No F.N deficits, ALL C.Nerves Intact, Strength 5/5 all 4 extremities, Sensation intact all 4 extremities, Plantars down going.  4. Ears and Eyes appear Normal, Conjunctivae clear, PERRLA. Moist Oral Mucosa.  5. Supple Neck, No JVD, No cervical lymphadenopathy appriciated, No Carotid Bruits.  6. Symmetrical Chest wall movement, Good air movement bilaterally, CTAB.  7. RRR, No Gallops, Rubs or Murmurs, No Parasternal Heave.  8. Positive Bowel Sounds, Abdomen Soft, No tenderness, No organomegaly appriciated,No rebound -guarding or rigidity.  9.  No Cyanosis, Normal Skin Turgor, No Skin Rash or Bruise.  10. Good muscle tone,  joints appear normal , no effusions, Normal ROM.  11. No Palpable Lymph Nodes in Neck or Axillae     Data Review:    CBC  Recent Labs Lab 05/18/17 1629  WBC 7.3  HGB 16.2  HCT 48.5  PLT 231  MCV 86.6  MCH 28.9  MCHC 33.4  RDW 12.9  LYMPHSABS 0.9  MONOABS 0.3  EOSABS 0.0  BASOSABS 0.0   ------------------------------------------------------------------------------------------------------------------  Chemistries   Recent Labs Lab 05/18/17 1629  NA 145  K 4.3  CL 102  CO2 25  GLUCOSE 137*  BUN 36*  CREATININE 0.97  CALCIUM 11.2*  AST 162*  ALT 78*  ALKPHOS 72  BILITOT 1.1   ------------------------------------------------------------------------------------------------------------------ estimated creatinine clearance is 74.4 mL/min (by C-G formula based on SCr of 0.97  mg/dL). ------------------------------------------------------------------------------------------------------------------ No results for input(s): TSH, T4TOTAL, T3FREE, THYROIDAB in the last 72 hours.  Invalid input(s): FREET3  Coagulation profile No results for input(s): INR, PROTIME in the last 168 hours. ------------------------------------------------------------------------------------------------------------------- No results for input(s): DDIMER in the last 72 hours. -------------------------------------------------------------------------------------------------------------------  Cardiac Enzymes  Recent Labs Lab 05/18/17 1629  TROPONINI 0.05*   ------------------------------------------------------------------------------------------------------------------ No results found for: BNP   ---------------------------------------------------------------------------------------------------------------  Urinalysis    Component Value Date/Time   COLORURINE YELLOW 05/18/2017 2035   APPEARANCEUR CLOUDY (A) 05/18/2017 2035   LABSPEC 1.017 05/18/2017 2035   PHURINE 5.0 05/18/2017 2035   GLUCOSEU NEGATIVE 05/18/2017 2035   HGBUR MODERATE (A) 05/18/2017 2035   BILIRUBINUR NEGATIVE 05/18/2017 2035   KETONESUR 20 (A) 05/18/2017 2035   PROTEINUR 100 (A) 05/18/2017 2035   UROBILINOGEN 1.0 01/25/2014 1700   NITRITE POSITIVE (A) 05/18/2017 2035   LEUKOCYTESUR LARGE (A) 05/18/2017 2035    ----------------------------------------------------------------------------------------------------------------   Imaging Results:    Dg Chest 2 View  Result Date: 05/18/2017 CLINICAL DATA:  Patient found down. Altered mental status. Hallucinations. Confusion. EXAM: CHEST  2 VIEW COMPARISON:  10/14/2016. FINDINGS: The heart size and mediastinal contours are within normal limits. The lungs are free of infiltrates or edema. In the posterior segment RIGHT upper lobe, there is a suspected 2.7 x 3.5  cm mass. This is new/ increased from priors. There is some overlap from telemetry leads. CT chest with contrast recommended. The visualized skeletal structures are unremarkable. Prominent colonic distention under the RIGHT and LEFT hemidiaphragms simulates pneumoperitoneum. Healed RIGHT-sided rib fractures. IMPRESSION: Query 2.7 x 3.5 cm RIGHT upper lobe mass. CT chest with contrast recommended. Considerable colonic distention under the RIGHT and LEFT hemidiaphragms, simulating pneumoperitoneum. Bowel obstruction cannot be excluded on this chest radiograph. Consider flat and erect abdominal films. Electronically Signed   By: Staci Righter M.D.   On: 05/18/2017 16:14   Dg Tibia/fibula Left  Result  Date: 05/18/2017 CLINICAL DATA:  Patient found down today. Left lower leg pain with movement. Initial encounter. EXAM: LEFT TIBIA AND FIBULA - 2 VIEW COMPARISON:  Plain films left ankle 06/24/2016. FINDINGS: The patient has healed diaphyseal fractures of the tibia and fibula. The fractures are partially seen on the prior examination. Avulsion fracture of the dorsal calcaneus seen on the prior study has been repaired with a single screw in place. No acute bony abnormality is identified. IMPRESSION: No acute finding. Healed tibial, fibular and calcaneal fractures. Electronically Signed   By: Inge Rise M.D.   On: 05/18/2017 16:11   Ct Head Wo Contrast  Result Date: 05/18/2017 CLINICAL DATA:  EMS reports Home health care nurse called due to being found altered and on kitchen floor unknown LOC unknown down time. Pt experiencing hallucinations. Pt denies pain and has no complaints. Hx DM EXAM: CT HEAD WITHOUT CONTRAST CT CERVICAL SPINE WITHOUT CONTRAST TECHNIQUE: Multidetector CT imaging of the head and cervical spine was performed following the standard protocol without intravenous contrast. Multiplanar CT image reconstructions of the cervical spine were also generated. COMPARISON:  Head CT 03/11/2017, C-spine  05/31/2009 FINDINGS: CT HEAD FINDINGS Brain: No intracranial hemorrhage. No parenchymal contusion. No midline shift or mass effect. Basilar cisterns are patent. No skull base fracture. No fluid in the paranasal sinuses or mastoid air cells. Orbits are normal. Encephalomalacia within the RIGHT temporal lobe unchanged. Prior RIGHT craniotomy noted. Vascular: No hyperdense vessel or unexpected calcification. Skull: Normal. Negative for fracture or focal lesion. Sinuses/Orbits: Paranasal sinuses and mastoid air cells are clear. Orbits are clear. Other: None. CT CERVICAL SPINE FINDINGS Alignment:  Focal kyphosis in the mid cervical spine. Skull base and vertebrae: Normal craniocervical junction. No loss of vertebral body height or disc height. Normal facet articulation. No evidence of fracture. Soft tissues and spinal canal: No prevertebral soft tissue swelling. No perispinal or epidural hematoma. Disc levels: Multiple levels of endplate spurring and joint space narrowing. No acute subluxation. Mild anterolisthesis of C3 on C4 is favored degenerative. Upper chest: Clear Other: None IMPRESSION: 1. No acute intracranial trauma. 2. Postprocedural encephalomalacia in the RIGHT temporal lobe unchanged. 3. No cervical spine fracture. 4. Multilevel disc osteophytic disease. 5. Focal kyphosis in the mid cervical spine similar to comparison exam. Electronically Signed   By: Suzy Bouchard M.D.   On: 05/18/2017 16:27   Ct Angio Chest Pe W/cm &/or Wo Cm  Result Date: 05/18/2017 CLINICAL DATA:  Chest pain. EXAM: CT ANGIOGRAPHY CHEST WITH CONTRAST TECHNIQUE: Multidetector CT imaging of the chest was performed using the standard protocol during bolus administration of intravenous contrast. Multiplanar CT image reconstructions and MIPs were obtained to evaluate the vascular anatomy. CONTRAST:  100 cc Isovue 370 intravenously. COMPARISON:  Chest radiograph 05/18/2017 FINDINGS: Cardiovascular: Normal heart size. No pericardial  effusion. Calcific atherosclerotic disease of the coronary arteries. No evidence of pulmonary embolus. Mediastinum/Nodes: Right hilar lymphadenopathy measuring collectively 20 mm. Milder right peritracheal lymphadenopathy. Enlarged peribronchial lymph node versus a satellite nodule in the right upper lobe. Trachea and esophagus are normal. Lungs/Pleura: Spiculated soft tissue mass in the posterior right upper lobe measures 2.8 x 3.5 x 3.6 cm. Prominent spicules extend to the posterior pleural surface. Hazy ground-glass opacities surround this solid soft tissue mass. The previously mentioned likely malignant right hilar lymphadenopathy narrows slightly the right mainstem bronchus. Nodular and ground-glass opacities are seen in the right lower lobe, particularly in its superior segment. Example, linear approximately 1 cm in AP dimension ground-glass opacity,  image 53/106, series 11. Area of patchy airspace consolidation versus a satellite nodule is seen in the lateral segment of the right middle lobe. Upper Abdomen: No acute abnormality. Musculoskeletal: No chest wall abnormality. No acute or significant osseous findings. Review of the MIP images confirms the above findings. IMPRESSION: No evidence of pulmonary embolus. Spiculated 3.6 cm right upper lobe soft tissue mass, highly suspicious for primary lung malignancy, with associated right hilar and paratracheal lymphadenopathy. 9 mm soft tissue nodule along the posterior right upper lobe segmental bronchus may represent a satellite nodule versus a peribronchial lymph node. Hazy ground-glass opacities surround the soft tissue mass and may represent direct extension of disease versus surrounding inflammatory changes. Nodular and ground-glass opacities throughout the right lower lobe and right middle lobe may also represent distal spread of disease. These results were called by telephone at the time of interpretation on 05/18/2017 at 8:39 pm to Dr. Sherwood Gambler , who  verbally acknowledged these results. Electronically Signed   By: Fidela Salisbury M.D.   On: 05/18/2017 20:42   Ct Cervical Spine Wo Contrast  Result Date: 05/18/2017 CLINICAL DATA:  EMS reports Home health care nurse called due to being found altered and on kitchen floor unknown LOC unknown down time. Pt experiencing hallucinations. Pt denies pain and has no complaints. Hx DM EXAM: CT HEAD WITHOUT CONTRAST CT CERVICAL SPINE WITHOUT CONTRAST TECHNIQUE: Multidetector CT imaging of the head and cervical spine was performed following the standard protocol without intravenous contrast. Multiplanar CT image reconstructions of the cervical spine were also generated. COMPARISON:  Head CT 03/11/2017, C-spine 05/31/2009 FINDINGS: CT HEAD FINDINGS Brain: No intracranial hemorrhage. No parenchymal contusion. No midline shift or mass effect. Basilar cisterns are patent. No skull base fracture. No fluid in the paranasal sinuses or mastoid air cells. Orbits are normal. Encephalomalacia within the RIGHT temporal lobe unchanged. Prior RIGHT craniotomy noted. Vascular: No hyperdense vessel or unexpected calcification. Skull: Normal. Negative for fracture or focal lesion. Sinuses/Orbits: Paranasal sinuses and mastoid air cells are clear. Orbits are clear. Other: None. CT CERVICAL SPINE FINDINGS Alignment:  Focal kyphosis in the mid cervical spine. Skull base and vertebrae: Normal craniocervical junction. No loss of vertebral body height or disc height. Normal facet articulation. No evidence of fracture. Soft tissues and spinal canal: No prevertebral soft tissue swelling. No perispinal or epidural hematoma. Disc levels: Multiple levels of endplate spurring and joint space narrowing. No acute subluxation. Mild anterolisthesis of C3 on C4 is favored degenerative. Upper chest: Clear Other: None IMPRESSION: 1. No acute intracranial trauma. 2. Postprocedural encephalomalacia in the RIGHT temporal lobe unchanged. 3. No cervical  spine fracture. 4. Multilevel disc osteophytic disease. 5. Focal kyphosis in the mid cervical spine similar to comparison exam. Electronically Signed   By: Suzy Bouchard M.D.   On: 05/18/2017 16:27   Dg Foot Complete Left  Result Date: 05/18/2017 CLINICAL DATA:  Patient found down today. Left foot pain with movement. Initial encounter. EXAM: LEFT FOOT - COMPLETE 3+ VIEW COMPARISON:  Plain films left ankle 06/24/2006. FINDINGS: No acute bony or joint abnormality is identified. The patient is status post repair of an avulsion fracture of the calcaneus seen on the prior examination. Bones are osteopenic. IMPRESSION: No acute abnormality. Status post repair of a remote avulsion fracture of the calcaneus. Osteopenia. Electronically Signed   By: Inge Rise M.D.   On: 05/18/2017 16:17      Assessment & Plan:    Active Problems:   Altered mental status  AMS secondary to UTI Rocephin 1gm iv qday Await urine culture  Lung mass Pulmonary consult, (spoke with [place] for AM consult)  Abnormal liver function Check acute hepatitis panel Check RUQ ultrasound  Etoh use CIWA  Seizure do Cont dilantin  Glucose intolerance Cont metformin fsbs ac and qhs, ISS  DVT Prophylaxis - SCDs  AM Labs Ordered, also please review Full Orders  Family Communication: Admission, patients condition and plan of care including tests being ordered have been discussed with the patient  who indicate understanding and agree with the plan and Code Status.  Code Status FULL CODE  Likely DC to  home  Condition GUARDED    Consults called: pulmonary   Admission status: inpatient  Time spent in minutes : 45   Jani Gravel M.D on 05/18/2017 at 9:35 PM  Between 7am to 7pm - Pager - [phone]. After 7pm go to [email or web address] - password [facility]  [facility] - Office  [phone]

## 2017-05-18 NOTE — ED Provider Notes (Signed)
[facility] Provider Note   CSN: [id number] Arrival date & time: 05/18/17  1451  LEVEL 5 CAVEAT - ALTERED ENTAL STATUS   History   Chief Complaint Chief Complaint  Patient presents with  . Altered Mental Status    HPI Wyatt Salas is a 66 y.o. male.  HPI  66 year old male with a past history of a traumatic brain injury, seizure disorder, diabetes presents with altered mental status.  EMS was originally called because the neighbor found the patient lying on the floor.  It was unclear how long he has been lying there.  Patient tells me that this morning after taking his medicines he had spilled water on the floor and then slipped on it and fell.  He does not think he lost consciousness.  When asked why he did not get up he states that he has been dizzy.  He has both headache and has been feeling dizzy.  He states that he often gets migraines like this.  When asked if he thinks he could have had a seizure he states no, typically he has a more severe headache and more severe dizziness whenever he has a seizure.  He endorses a cough for the last few days but denies chest pain or shortness of breath.  Currently he is having some lower left leg pain and plantar foot pain.  He denies any neck or back pain. Knows he's in [facility] ED, knows name. Off on day of week (thinks it's Friday because he watched Thursday night football last night), month (november) and year (90s).  After workup was started, I was able to talk to 1 of his home health nurses.  She states that the patient did not seem well yesterday and complained of "not feeling well".  He asked for the nurses not to come.  When they try to come this morning the patient could be heard inside stating that he was trying to get his wheelchair to move and that he was stuck.  He finally, nurses were let in and found him on the floor.  A wheelchair was in another room.  The patient did not seem to recognize the  nurse despite having known her for over 2 years.  The nurse I am talking to states that he is typically alert and oriented x3.  Past Medical History:  Diagnosis Date  . Diabetes mellitus without complication ([place])   . Diastolic dysfunction    a. ECHO 07/2013 EF 55-60% and grade 1 diastolic dysfunction  . NSTEMI (non-ST elevated myocardial infarction) ([place])    problem list 01/2014  . Rhabdomyolysis 01/2014  . Seizures ([place])   . Syncope 01/2014  . Traumatic brain injury [facility])     Patient Active Problem List   Diagnosis Date Noted  . Altered mental status 05/18/2017  . Right sided weakness 10/14/2016  . Acute lower UTI 10/14/2016  . Elevated troponin 10/14/2016  . Type 2 diabetes mellitus with hyperlipidemia ([place]) 10/14/2016  . Fall at home--mechanical 01/26/2014  . Elevated CK 01/26/2014  . Seizure disorder ([place]) 07/26/2013  . NSTEMI (non-ST elevated myocardial infarction) ([place]) 07/26/2013  . Hyperlipidemia 07/26/2013  . Aspiration pneumonia ([place]) 07/26/2013  . Rhabdomyolysis 07/26/2013    Past Surgical History:  Procedure Laterality Date  . CRANIOTOMY         Home Medications    Prior to Admission medications   Medication Sig Start Date End Date Taking? Authorizing Provider  acetaminophen (TYLENOL) 500 MG tablet Take  1,000 mg by mouth every 12 (twelve) hours as needed for mild pain or fever.   Yes [provider]  carbamide peroxide (DEBROX) 6.5 % OTIC solution Place 5-10 drops into both ears every other day.    Yes [provider]  carboxymethylcellulose (REFRESH PLUS) 0.5 % SOLN Place 1 drop into both eyes 2 (two) times daily as needed (Dry eyes).   Yes [provider]  docusate sodium (COLACE) 100 MG capsule Take 200 mg by mouth 2 (two) times daily.   Yes [provider]  ENSURE PLUS (ENSURE PLUS) LIQD Take 237 mLs by mouth 2 (two) times daily between meals.   Yes [provider]  galantamine (RAZADYNE) 8 MG tablet Take 1  tablet (8 mg total) by mouth 2 (two) times daily with a meal. 07/31/13  Yes [name], MD  ibuprofen (ADVIL,MOTRIN) 200 MG tablet Take 200 mg by mouth 2 (two) times daily as needed for headache.    Yes [provider]  lacosamide (VIMPAT) 50 MG TABS tablet Take 100 mg by mouth 2 (two) times daily.   Yes [provider]  levETIRAcetam (KEPPRA) 750 MG tablet Take 1,500 mg by mouth 2 (two) times daily.   Yes [provider]  lisinopril (PRINIVIL,ZESTRIL) 5 MG tablet Take 5 mg by mouth daily.   Yes [provider]  metFORMIN (GLUCOPHAGE) 500 MG tablet Take 500 mg by mouth daily with breakfast.    Yes [provider]  Multiple Vitamin (MULTIVITAMIN WITH MINERALS) TABS tablet Take 1 tablet by mouth daily.   Yes [provider]  phenytoin (DILANTIN) 30 MG ER capsule Take 1 capsule (30 mg total) by mouth 2 (two) times daily. 10/15/16 05/18/17 Yes Arrien, [name], MD  simvastatin (ZOCOR) 80 MG tablet Take 40 mg by mouth at bedtime.    Yes [provider]    Family History Family History  Problem Relation Age of Onset  . Headache Mother   . Diabetes Neg Hx   . Stroke Neg Hx     Social History Social History  Substance Use Topics  . Smoking status: Former Smoker    Types: Cigars  . Smokeless tobacco: Never Used  . Alcohol use Yes     Comment: rare     Allergies   Shellfish allergy   Review of Systems Review of Systems  Unable to perform ROS: Mental status change     Physical Exam Updated Vital Signs BP (!) 146/67 (BP Location: Left Arm)   Pulse 71   Temp 98 F (36.7 C) (Oral)   Resp 18   Ht 6\' 1"  (1.854 m)   Wt 71.2 kg (157 lb)   SpO2 100%   BMI 20.71 kg/m   Physical Exam  Constitutional: He appears well-developed and well-nourished.  HENT:  Head: Normocephalic and atraumatic.  Right Ear: External ear normal.  Left Ear: External ear normal.  Nose: Nose normal.  Dry lips Chronic scar to occiput    Eyes: Pupils are equal, round, and reactive to light. EOM are normal. Right eye exhibits no discharge. Left eye exhibits no discharge.  Neck: Neck supple. No spinous process tenderness and no muscular tenderness present.  Cardiovascular: Normal rate, regular rhythm and normal heart sounds.   Pulmonary/Chest: Effort normal and breath sounds normal. He has no wheezes. He has no rales.  Abdominal: Soft. He exhibits no distension. There is no tenderness.  Musculoskeletal: He exhibits no edema.       Right hip: He  exhibits normal range of motion and no tenderness.       Left hip: He exhibits normal range of motion and no tenderness.       Left ankle: He exhibits normal range of motion. No tenderness.       Cervical back: He exhibits no tenderness.       Thoracic back: He exhibits no tenderness.       Lumbar back: He exhibits no tenderness.       Left lower leg: He exhibits tenderness (mld, distal). He exhibits no swelling.       Left foot: There is tenderness (plantar). There is no swelling.  Neurological: He is alert.  Awake, alert, oriented to person and place, disoriented to time. CN 3-12 grossly intact save for mild left facial droop. 5/5 strength RUE, RLE. 4/5 Strength in LUE, LLE, chronic per patient. Normal finger to nose right, ataxic on left.  Skin: Skin is warm and dry. He is not diaphoretic.  Nursing note and vitals reviewed.    ED Treatments / Results  Labs (all labs ordered are listed, but only abnormal results are displayed) Labs Reviewed  COMPREHENSIVE METABOLIC PANEL - Abnormal; Notable for the following:       Result Value   Glucose, Bld 137 (*)    BUN 36 (*)    Calcium 11.2 (*)    Total Protein 10.6 (*)    Albumin 5.3 (*)    AST 162 (*)    ALT 78 (*)    Anion gap 18 (*)    All other components within normal limits  URINALYSIS, ROUTINE W REFLEX MICROSCOPIC - Abnormal; Notable for the following:    APPearance CLOUDY (*)    Hgb urine dipstick MODERATE (*)     Ketones, ur 20 (*)    Protein, ur 100 (*)    Nitrite POSITIVE (*)    Leukocytes, UA LARGE (*)    Bacteria, UA MANY (*)    All other components within normal limits  TROPONIN I - Abnormal; Notable for the following:    Troponin I 0.05 (*)    All other components within normal limits  CK - Abnormal; Notable for the following:    Total CK 2,035 (*)    All other components within normal limits  CBG MONITORING, ED - Abnormal; Notable for the following:    Glucose-Capillary 127 (*)    All other components within normal limits  URINE CULTURE  CBC WITH DIFFERENTIAL/PLATELET  ETHANOL  RAPID URINE DRUG SCREEN, HOSP PERFORMED  COMPREHENSIVE METABOLIC PANEL  CBC  TSH  HEPATITIS PANEL, ACUTE  VITAMIN B12  SEDIMENTATION RATE  RPR    EKG  EKG Interpretation  Date/Time:  Tuesday May 18 2017 16:33:52 EDT Ventricular Rate:  88 PR Interval:    QRS Duration: 137 QT Interval:  365 QTC Calculation: 442 R Axis:   29 Text Interpretation:  Sinus rhythm Ventricular trigeminy Biatrial enlargement Nonspecific intraventricular conduction delay Nonspecific repol abnormality, lateral leads Borderline ST elevation, anterior leads nonspecific ST/T changes new since Aug 2018 Confirmed by Sherwood Gambler [phone]) on 05/18/2017 4:44:45 PM       Radiology Dg Chest 2 View  Result Date: 05/18/2017 CLINICAL DATA:  Patient found down. Altered mental status. Hallucinations. Confusion. EXAM: CHEST  2 VIEW COMPARISON:  10/14/2016. FINDINGS: The heart size and mediastinal contours are within normal limits. The lungs are free of infiltrates or edema. In the posterior segment RIGHT upper lobe, there is a suspected 2.7 x 3.5 cm  mass. This is new/ increased from priors. There is some overlap from telemetry leads. CT chest with contrast recommended. The visualized skeletal structures are unremarkable. Prominent colonic distention under the RIGHT and LEFT hemidiaphragms simulates pneumoperitoneum. Healed RIGHT-sided  rib fractures. IMPRESSION: Query 2.7 x 3.5 cm RIGHT upper lobe mass. CT chest with contrast recommended. Considerable colonic distention under the RIGHT and LEFT hemidiaphragms, simulating pneumoperitoneum. Bowel obstruction cannot be excluded on this chest radiograph. Consider flat and erect abdominal films. Electronically Signed   By: Staci Righter M.D.   On: 05/18/2017 16:14   Dg Tibia/fibula Left  Result Date: 05/18/2017 CLINICAL DATA:  Patient found down today. Left lower leg pain with movement. Initial encounter. EXAM: LEFT TIBIA AND FIBULA - 2 VIEW COMPARISON:  Plain films left ankle 06/24/2016. FINDINGS: The patient has healed diaphyseal fractures of the tibia and fibula. The fractures are partially seen on the prior examination. Avulsion fracture of the dorsal calcaneus seen on the prior study has been repaired with a single screw in place. No acute bony abnormality is identified. IMPRESSION: No acute finding. Healed tibial, fibular and calcaneal fractures. Electronically Signed   By: Inge Rise M.D.   On: 05/18/2017 16:11   Ct Head Wo Contrast  Result Date: 05/18/2017 CLINICAL DATA:  EMS reports Home health care nurse called due to being found altered and on kitchen floor unknown LOC unknown down time. Pt experiencing hallucinations. Pt denies pain and has no complaints. Hx DM EXAM: CT HEAD WITHOUT CONTRAST CT CERVICAL SPINE WITHOUT CONTRAST TECHNIQUE: Multidetector CT imaging of the head and cervical spine was performed following the standard protocol without intravenous contrast. Multiplanar CT image reconstructions of the cervical spine were also generated. COMPARISON:  Head CT 03/11/2017, C-spine 05/31/2009 FINDINGS: CT HEAD FINDINGS Brain: No intracranial hemorrhage. No parenchymal contusion. No midline shift or mass effect. Basilar cisterns are patent. No skull base fracture. No fluid in the paranasal sinuses or mastoid air cells. Orbits are normal. Encephalomalacia within the RIGHT  temporal lobe unchanged. Prior RIGHT craniotomy noted. Vascular: No hyperdense vessel or unexpected calcification. Skull: Normal. Negative for fracture or focal lesion. Sinuses/Orbits: Paranasal sinuses and mastoid air cells are clear. Orbits are clear. Other: None. CT CERVICAL SPINE FINDINGS Alignment:  Focal kyphosis in the mid cervical spine. Skull base and vertebrae: Normal craniocervical junction. No loss of vertebral body height or disc height. Normal facet articulation. No evidence of fracture. Soft tissues and spinal canal: No prevertebral soft tissue swelling. No perispinal or epidural hematoma. Disc levels: Multiple levels of endplate spurring and joint space narrowing. No acute subluxation. Mild anterolisthesis of C3 on C4 is favored degenerative. Upper chest: Clear Other: None IMPRESSION: 1. No acute intracranial trauma. 2. Postprocedural encephalomalacia in the RIGHT temporal lobe unchanged. 3. No cervical spine fracture. 4. Multilevel disc osteophytic disease. 5. Focal kyphosis in the mid cervical spine similar to comparison exam. Electronically Signed   By: Suzy Bouchard M.D.   On: 05/18/2017 16:27   Ct Angio Chest Pe W/cm &/or Wo Cm  Result Date: 05/18/2017 CLINICAL DATA:  Chest pain. EXAM: CT ANGIOGRAPHY CHEST WITH CONTRAST TECHNIQUE: Multidetector CT imaging of the chest was performed using the standard protocol during bolus administration of intravenous contrast. Multiplanar CT image reconstructions and MIPs were obtained to evaluate the vascular anatomy. CONTRAST:  100 cc Isovue 370 intravenously. COMPARISON:  Chest radiograph 05/18/2017 FINDINGS: Cardiovascular: Normal heart size. No pericardial effusion. Calcific atherosclerotic disease of the coronary arteries. No evidence of pulmonary embolus. Mediastinum/Nodes: Right hilar  lymphadenopathy measuring collectively 20 mm. Milder right peritracheal lymphadenopathy. Enlarged peribronchial lymph node versus a satellite nodule in the right  upper lobe. Trachea and esophagus are normal. Lungs/Pleura: Spiculated soft tissue mass in the posterior right upper lobe measures 2.8 x 3.5 x 3.6 cm. Prominent spicules extend to the posterior pleural surface. Hazy ground-glass opacities surround this solid soft tissue mass. The previously mentioned likely malignant right hilar lymphadenopathy narrows slightly the right mainstem bronchus. Nodular and ground-glass opacities are seen in the right lower lobe, particularly in its superior segment. Example, linear approximately 1 cm in AP dimension ground-glass opacity, image 53/106, series 11. Area of patchy airspace consolidation versus a satellite nodule is seen in the lateral segment of the right middle lobe. Upper Abdomen: No acute abnormality. Musculoskeletal: No chest wall abnormality. No acute or significant osseous findings. Review of the MIP images confirms the above findings. IMPRESSION: No evidence of pulmonary embolus. Spiculated 3.6 cm right upper lobe soft tissue mass, highly suspicious for primary lung malignancy, with associated right hilar and paratracheal lymphadenopathy. 9 mm soft tissue nodule along the posterior right upper lobe segmental bronchus may represent a satellite nodule versus a peribronchial lymph node. Hazy ground-glass opacities surround the soft tissue mass and may represent direct extension of disease versus surrounding inflammatory changes. Nodular and ground-glass opacities throughout the right lower lobe and right middle lobe may also represent distal spread of disease. These results were called by telephone at the time of interpretation on 05/18/2017 at 8:39 pm to Dr. Sherwood Gambler , who verbally acknowledged these results. Electronically Signed   By: Fidela [place]is[place] M.D.   On: 05/18/2017 20:42   Ct Cervical Spine Wo Contrast  Result Date: 05/18/2017 CLINICAL DATA:  EMS reports Home health care nurse called due to being found altered and on kitchen floor unknown LOC  unknown down time. Pt experiencing hallucinations. Pt denies pain and has no complaints. Hx DM EXAM: CT HEAD WITHOUT CONTRAST CT CERVICAL SPINE WITHOUT CONTRAST TECHNIQUE: Multidetector CT imaging of the head and cervical spine was performed following the standard protocol without intravenous contrast. Multiplanar CT image reconstructions of the cervical spine were also generated. COMPARISON:  Head CT 03/11/2017, C-spine 05/31/2009 FINDINGS: CT HEAD FINDINGS Brain: No intracranial hemorrhage. No parenchymal contusion. No midline shift or mass effect. Basilar cisterns are patent. No skull base fracture. No fluid in the paranasal sinuses or mastoid air cells. Orbits are normal. Encephalomalacia within the RIGHT temporal lobe unchanged. Prior RIGHT craniotomy noted. Vascular: No hyperdense vessel or unexpected calcification. Skull: Normal. Negative for fracture or focal lesion. Sinuses/Orbits: Paranasal sinuses and mastoid air cells are clear. Orbits are clear. Other: None. CT CERVICAL SPINE FINDINGS Alignment:  Focal kyphosis in the mid cervical spine. Skull base and vertebrae: Normal craniocervical junction. No loss of vertebral body height or disc height. Normal facet articulation. No evidence of fracture. Soft tissues and spinal canal: No prevertebral soft tissue swelling. No perispinal or epidural hematoma. Disc levels: Multiple levels of endplate spurring and joint space narrowing. No acute subluxation. Mild anterolisthesis of C3 on C4 is favored degenerative. Upper chest: Clear Other: None IMPRESSION: 1. No acute intracranial trauma. 2. Postprocedural encephalomalacia in the RIGHT temporal lobe unchanged. 3. No cervical spine fracture. 4. Multilevel disc osteophytic disease. 5. Focal kyphosis in the mid cervical spine similar to comparison exam. Electronically Signed   By: Suzy Bouchard M.D.   On: 05/18/2017 16:27   Dg Foot Complete Left  Result Date: 05/18/2017 CLINICAL DATA:  Patient found  down today.  Left foot pain with movement. Initial encounter. EXAM: LEFT FOOT - COMPLETE 3+ VIEW COMPARISON:  Plain films left ankle 06/24/2006. FINDINGS: No acute bony or joint abnormality is identified. The patient is status post repair of an avulsion fracture of the calcaneus seen on the prior examination. Bones are osteopenic. IMPRESSION: No acute abnormality. Status post repair of a remote avulsion fracture of the calcaneus. Osteopenia. Electronically Signed   By: Inge Rise M.D.   On: 05/18/2017 16:17    Procedures Procedures (including critical care time)  Medications Ordered in ED Medications  iopamidol (ISOVUE-370) 76 % injection (not administered)  polyvinyl alcohol (LIQUIFILM TEARS) 1.4 % ophthalmic solution 1 drop (not administered)  feeding supplement (ENSURE ENLIVE) (ENSURE ENLIVE) liquid 237 mL (not administered)  lacosamide (VIMPAT) tablet 100 mg (not administered)  levETIRAcetam (KEPPRA) tablet 1,500 mg (not administered)  lisinopril (PRINIVIL,ZESTRIL) tablet 5 mg (not administered)  multivitamin with minerals tablet 1 tablet (not administered)  phenytoin (DILANTIN) ER capsule 30 mg (not administered)  galantamine (RAZADYNE) tablet 8 mg (not administered)  docusate sodium (COLACE) capsule 200 mg (not administered)  metFORMIN (GLUCOPHAGE) tablet 500 mg (not administered)  atorvastatin (LIPITOR) tablet 40 mg (not administered)  enoxaparin (LOVENOX) injection 40 mg (not administered)  sodium chloride 0.9 % 1,000 mL with thiamine 269 mg, folic acid 1 mg, multivitamins adult 10 mL infusion (not administered)  cefTRIAXone (ROCEPHIN) 1 g in dextrose 5 % 50 mL IVPB (not administered)  LORazepam (ATIVAN) tablet 1 mg (not administered)    Or  LORazepam (ATIVAN) injection 1 mg (not administered)  thiamine (VITAMIN B-1) tablet 100 mg (not administered)    Or  thiamine (B-1) injection 100 mg (not administered)  folic acid (FOLVITE) tablet 1 mg (not administered)  multivitamin with minerals  tablet 1 tablet (not administered)  sodium chloride 0.9 % bolus 1,000 mL (0 mLs Intravenous Stopped 05/18/17 1746)  sodium chloride 0.9 % bolus 1,000 mL (0 mLs Intravenous Stopped 05/18/17 1827)  aspirin chewable tablet 324 mg (324 mg Oral Given 05/18/17 1751)  iopamidol (ISOVUE-370) 76 % injection 100 mL (100 mLs Intravenous Contrast Given 05/18/17 1957)  cefTRIAXone (ROCEPHIN) 1 g in dextrose 5 % 50 mL IVPB (0 g Intravenous Stopped 05/18/17 2200)     Initial Impression / Assessment and Plan / ED Course  I have reviewed the triage vital signs and the nursing notes.  Pertinent labs & imaging results that were available during my care of the patient were reviewed by me and considered in my medical decision making (see chart for details).     Patient is definitely altered per his baseline.  His urinalysis is consistent with urinary tract infection which likely explains this altered mental status.  He also has likely myoglobinuria as well as rhabdomyolysis.  He was given IV fluids.  His kidney function is okay.  Given cough, chest x-ray obtained which shows likely mass and this was followed up with CT scan with the results as above.  I discussed this information with the patient and he seemed to understand although he is confused.  Otherwise, he will need admission for the altered mental state in association with a UTI.  He has diffuse nonspecific ST changes but no STEMI or other pathology. Troponin minimally elevated. Will need to be trended. Dr. Maudie Mercury to admit.  Final Clinical Impressions(s) / ED Diagnoses   Final diagnoses:  Altered mental status, unspecified altered mental status type  Acute urinary tract infection    New Prescriptions Current  Discharge Medication List       Sherwood Gambler, MD 05/19/17 [phone]

## 2017-05-18 NOTE — ED Notes (Signed)
Pt cannot use restroom at this time, aware urine specimen is needed.  

## 2017-05-18 NOTE — ED Notes (Signed)
Patient transported to CT 

## 2017-05-18 NOTE — ED Notes (Signed)
Abnormal lab called to RN

## 2017-05-18 NOTE — ED Notes (Signed)
Applied condom catheter.

## 2017-05-18 NOTE — ED Triage Notes (Signed)
EMS reports Home health care nurse called due to being found altered and on kitchen floor unknown LOC unknown down time. Pt experiencing hallucinations. Pt denies pain and has no complaints  CBG 108 BP 108/81 HR 90 SO2 100 RA

## 2017-05-18 NOTE — ED Notes (Signed)
Bed: WA01 Expected date:  Expected time:  Means of arrival:  Comments: EMS-altered-

## 2017-05-19 ENCOUNTER — Inpatient Hospital Stay (HOSPITAL_COMMUNITY): Payer: Medicare HMO

## 2017-05-19 DIAGNOSIS — N390 Urinary tract infection, site not specified: Principal | ICD-10-CM

## 2017-05-19 DIAGNOSIS — R918 Other nonspecific abnormal finding of lung field: Secondary | ICD-10-CM

## 2017-05-19 DIAGNOSIS — R569 Unspecified convulsions: Secondary | ICD-10-CM

## 2017-05-19 DIAGNOSIS — Z789 Other specified health status: Secondary | ICD-10-CM

## 2017-05-19 DIAGNOSIS — K7200 Acute and subacute hepatic failure without coma: Secondary | ICD-10-CM

## 2017-05-19 DIAGNOSIS — E119 Type 2 diabetes mellitus without complications: Secondary | ICD-10-CM

## 2017-05-19 LAB — COMPREHENSIVE METABOLIC PANEL
ALBUMIN: 3.6 g/dL (ref 3.5–5.0)
ALK PHOS: 51 U/L (ref 38–126)
ALT: 61 U/L (ref 17–63)
ANION GAP: 13 (ref 5–15)
AST: 120 U/L — ABNORMAL HIGH (ref 15–41)
BILIRUBIN TOTAL: 1 mg/dL (ref 0.3–1.2)
BUN: 24 mg/dL — ABNORMAL HIGH (ref 6–20)
CALCIUM: 9.3 mg/dL (ref 8.9–10.3)
CO2: 21 mmol/L — ABNORMAL LOW (ref 22–32)
Chloride: 107 mmol/L (ref 101–111)
Creatinine, Ser: 0.79 mg/dL (ref 0.61–1.24)
GFR calc non Af Amer: >60 mL/min (ref >60)
GLUCOSE: 117 mg/dL — AB (ref 65–99)
Potassium: 4.1 mmol/L (ref 3.5–5.1)
Sodium: 141 mmol/L (ref 135–145)
Total Protein: 7.2 g/dL (ref 6.5–8.1)

## 2017-05-19 LAB — CBC
HCT: 40.4 % (ref 39.0–52.0)
Hemoglobin: 13.4 g/dL (ref 13.0–17.0)
MCH: 28.5 pg (ref 26.0–34.0)
MCHC: 33.2 g/dL (ref 30.0–36.0)
MCV: 86 fL (ref 78.0–100.0)
Platelets: 154 K/uL (ref 150–400)
RBC: 4.7 MIL/uL (ref 4.22–5.81)
RDW: 13 % (ref 11.5–15.5)
WBC: 7.8 K/uL (ref 4.0–10.5)

## 2017-05-19 LAB — RPR: RPR: NONREACTIVE

## 2017-05-19 LAB — VITAMIN B12: Vitamin B-12: 403 pg/mL (ref 180–914)

## 2017-05-19 LAB — GLUCOSE, CAPILLARY
Glucose-Capillary: 119 mg/dL — ABNORMAL HIGH (ref 65–99)
Glucose-Capillary: 84 mg/dL (ref 65–99)

## 2017-05-19 LAB — TSH: TSH: 1.67 u[IU]/mL (ref 0.350–4.500)

## 2017-05-19 LAB — SEDIMENTATION RATE: Sed Rate: 25 mm/hr — ABNORMAL HIGH (ref 0–16)

## 2017-05-19 MED ORDER — FOLIC ACID 1 MG PO TABS
1.0000 mg | ORAL_TABLET | Freq: Every day | ORAL | Status: DC
  Administered 2017-05-19 – 2017-05-24 (×6): 1 mg via ORAL
  Filled 2017-05-19 (×6): qty 1

## 2017-05-19 MED ORDER — ADULT MULTIVITAMIN W/MINERALS CH: 1.0000 | ORAL_TABLET | Freq: Every day | ORAL | Status: DC

## 2017-05-19 MED ORDER — INSULIN DETEMIR 100 UNIT/ML ~~LOC~~ SOLN
5.0000 [IU] | Freq: Every day | SUBCUTANEOUS | Status: DC
  Administered 2017-05-19 – 2017-05-23 (×5): 5 [IU] via SUBCUTANEOUS
  Filled 2017-05-19 (×6): qty 0.05

## 2017-05-19 MED ORDER — VITAMIN B-1 100 MG PO TABS
100.0000 mg | ORAL_TABLET | Freq: Every day | ORAL | Status: DC
Start: 2017-05-19 — End: 2017-05-24
  Administered 2017-05-19 – 2017-05-24 (×6): 100 mg via ORAL
  Filled 2017-05-19 (×6): qty 1

## 2017-05-19 MED ORDER — SODIUM CHLORIDE 0.9 % IV SOLN
INTRAVENOUS | Status: DC
  Administered 2017-05-19 – 2017-05-23 (×7): via INTRAVENOUS

## 2017-05-19 MED ORDER — LORAZEPAM 2 MG/ML IJ SOLN: 1.0000 mg | Freq: Four times a day (QID) | INTRAMUSCULAR | Status: AC | PRN

## 2017-05-19 MED ORDER — INSULIN ASPART 100 UNIT/ML ~~LOC~~ SOLN
0.0000 [IU] | Freq: Three times a day (TID) | SUBCUTANEOUS | Status: DC
  Administered 2017-05-19 – 2017-05-22 (×5): 1 [IU] via SUBCUTANEOUS
  Administered 2017-05-22 – 2017-05-24 (×3): 2 [IU] via SUBCUTANEOUS
  Administered 2017-05-24: 1 [IU] via SUBCUTANEOUS

## 2017-05-19 MED ORDER — INSULIN ASPART 100 UNIT/ML ~~LOC~~ SOLN: 0.0000 [IU] | Freq: Every day | SUBCUTANEOUS | Status: DC

## 2017-05-19 MED ORDER — LORAZEPAM 1 MG PO TABS: 1.0000 mg | ORAL_TABLET | Freq: Four times a day (QID) | ORAL | Status: AC | PRN

## 2017-05-19 MED ORDER — THIAMINE HCL 100 MG/ML IJ SOLN: 100.0000 mg | Freq: Every day | INTRAMUSCULAR | Status: DC

## 2017-05-19 NOTE — Progress Notes (Signed)
April, RN transfer coordinator the [facility] called of admission to hospital. Pt use [facility] at home and [facility] from the [place].

## 2017-05-19 NOTE — Progress Notes (Signed)
Patient states that he drinks approximately 2 40 ounce beers/day. He states his last drink was Friday, October 26th due to running out of money.

## 2017-05-19 NOTE — Consult Note (Signed)
Name: Wyatt Salas MRN: [id number] DOB: [birth year]    ADMISSION DATE:  05/18/2017 CONSULTATION DATE:  10/31  REFERRING MD :  [name]   CHIEF COMPLAINT:  Lung mass  BRIEF PATIENT DESCRIPTION:  This is a 66 year old male who was admitted on 10/30 after a fall at home. He had difficulty getting up and did not know how long he was down, when found was confused so was brought to ER for evaluation. Has known h/o ETOH abuse.  Dx eval in ER also included CXR: this showed what appeared to be 2.7x3.5cm RUL lung mass; because of that a CT chest was obtained showing 3.6 cm RUL spiculated lung mass w/ associated right hilar and paratracheal LAN. Also a soft tissue nodule along the RUL segmental bronchus. Because of these findings we were asked to see.   SIGNIFICANT EVENTS    STUDIES:  CT chest 10/30: No evidence of pulmonary embolus.Spiculated 3.6 cm right upper lobe soft tissue mass, highly suspicious for primary lung malignancy, with associated right hilar and paratracheal lymphadenopathy. 9 mm soft tissue nodule along the posterior right upper lobe segmental bronchus may represent a satellite nodule versus a peribronchial lymph node.Hazy ground-glass opacities surround the soft tissue mass and may represent direct extension of disease versus surrounding inflammatory changes.Nodular and ground-glass opacities throughout the right lower lobe and right middle lobe may also represent distal spread of disease.   HISTORY OF PRESENT ILLNESS:  See above   PAST MEDICAL HISTORY :   has a past medical history of Diabetes mellitus without complication ([place]); Diastolic dysfunction; NSTEMI (non-ST elevated myocardial infarction) ([name]); Rhabdomyolysis (01/2014); Seizures ([name]); Syncope (01/2014); and Traumatic brain injury [name]).  has a past surgical history that includes Craniotomy. Prior to Admission medications   Medication Sig Start Date End Date Taking? Authorizing Provider  acetaminophen (TYLENOL) 500  MG tablet Take 1,000 mg by mouth every 12 (twelve) hours as needed for mild pain or fever.   Yes [provider]  carbamide peroxide (DEBROX) 6.5 % OTIC solution Place 5-10 drops into both ears every other day.    Yes [provider]  carboxymethylcellulose (REFRESH PLUS) 0.5 % SOLN Place 1 drop into both eyes 2 (two) times daily as needed (Dry eyes).   Yes [provider]  docusate sodium (COLACE) 100 MG capsule Take 200 mg by mouth 2 (two) times daily.   Yes [provider]  ENSURE PLUS (ENSURE PLUS) LIQD Take 237 mLs by mouth 2 (two) times daily between meals.   Yes [provider]  galantamine (RAZADYNE) 8 MG tablet Take 1 tablet (8 mg total) by mouth 2 (two) times daily with a meal. 07/31/13  Yes [name], MD  ibuprofen (ADVIL,MOTRIN) 200 MG tablet Take 200 mg by mouth 2 (two) times daily as needed for headache.    Yes [provider]  lacosamide (VIMPAT) 50 MG TABS tablet Take 100 mg by mouth 2 (two) times daily.   Yes [provider]  levETIRAcetam (KEPPRA) 750 MG tablet Take 1,500 mg by mouth 2 (two) times daily.   Yes [provider]  lisinopril (PRINIVIL,ZESTRIL) 5 MG tablet Take 5 mg by mouth daily.   Yes [provider]  metFORMIN (GLUCOPHAGE) 500 MG tablet Take 500 mg by mouth daily with breakfast.    Yes [provider]  Multiple Vitamin (MULTIVITAMIN WITH MINERALS) TABS tablet Take 1 tablet by mouth daily.   Yes [provider]  phenytoin (DILANTIN) 30 MG ER capsule  Take 1 capsule (30 mg total) by mouth 2 (two) times daily. 10/15/16 05/18/17 Yes Arrien, [name], MD  simvastatin (ZOCOR) 80 MG tablet Take 40 mg by mouth at bedtime.    Yes [provider]   Allergies  Allergen Reactions  . Shellfish Allergy Rash    FAMILY HISTORY:  family history includes Headache in his mother. SOCIAL HISTORY:  reports that he has quit smoking. His smoking use included Cigars. He  has never used smokeless tobacco. He reports that he drinks alcohol. He reports that he uses drugs, including Marijuana. Was employed as a Training and development officer  REVIEW OF SYSTEMS:   Constitutional: Negative for fever, chills, weight loss, malaise/fatigue and diaphoresis.  HENT: Negative for hearing loss, ear pain, nosebleeds, congestion, sore throat, neck pain, tinnitus and ear discharge.   Eyes: Negative for blurred vision, double vision, photophobia, pain, discharge and redness.  Respiratory: Negative for cough, hemoptysis, sputum production, shortness of breath, wheezing and stridor.   Cardiovascular: Negative for chest pain, palpitations, orthopnea, claudication, leg swelling and PND.  Gastrointestinal: Negative for heartburn, nausea, vomiting, abdominal pain, diarrhea, constipation, blood in stool and melena.  Genitourinary: Negative for dysuria, urgency, frequency, hematuria and flank pain.  Musculoskeletal: Walks with a cane, requires wheelchair at times, has been like this for 2 years  skin: Negative for itching and rash.  Neurological: Negative for dizziness, tingling, tremors, sensory change, speech change, focal weakness, seizures, loss of consciousness, weakness and headaches.  Endo/Heme/Allergies: Negative for environmental allergies and polydipsia. Does not bruise/bleed easily.  SUBJECTIVE:  Feels better VITAL SIGNS: Temp:  [98 F (36.7 C)-98.8 F (37.1 C)] 98 F (36.7 C) (10/31 0644) Pulse Rate:  [71-95] 73 (10/31 0644) Resp:  [12-18] 18 (10/31 0644) BP: (110-146)/(63-87) 118/63 (10/31 0644) SpO2:  [99 %-100 %] 99 % (10/31 0644) Weight:  [157 lb (71.2 kg)] 157 lb (71.2 kg) (10/30 1506)  PHYSICAL EXAMINATION: General: Chronically ill-appearing African-American male, frail physically, but not in acute distress Neuro: Awake, oriented, no focal neurological deficits HEENT: Normocephalic atraumatic poor dentition but no jugular venous distention sclerae anicteric some temporal wasting  noted Cardiovascular: Regular rate and rhythm without murmur rub or gallop Lungs: Clear to auscultation no accessory muscle use Abdomen: Soft nontender no organomegaly Musculoskeletal: Equal strength and bulk Skin: Warm and dry   Recent Labs Lab 05/18/17 1629 05/19/17 0514  NA 145 141  K 4.3 4.1  CL 102 107  CO2 25 21*  BUN 36* 24*  CREATININE 0.97 0.79  GLUCOSE 137* 117*    Recent Labs Lab 05/18/17 1629 05/19/17 0636  HGB 16.2 13.4  HCT 48.5 40.4  WBC 7.3 7.8  PLT 231 154   Dg Chest 2 View  Result Date: 05/18/2017 CLINICAL DATA:  Patient found down. Altered mental status. Hallucinations. Confusion. EXAM: CHEST  2 VIEW COMPARISON:  10/14/2016. FINDINGS: The heart size and mediastinal contours are within normal limits. The lungs are free of infiltrates or edema. In the posterior segment RIGHT upper lobe, there is a suspected 2.7 x 3.5 cm mass. This is new/ increased from priors. There is some overlap from telemetry leads. CT chest with contrast recommended. The visualized skeletal structures are unremarkable. Prominent colonic distention under the RIGHT and LEFT hemidiaphragms simulates pneumoperitoneum. Healed RIGHT-sided rib fractures. IMPRESSION: Query 2.7 x 3.5 cm RIGHT upper lobe mass. CT chest with contrast recommended. Considerable colonic distention under the RIGHT and LEFT hemidiaphragms, simulating pneumoperitoneum. Bowel obstruction cannot be excluded on this chest radiograph. Consider flat and erect abdominal  films. Electronically Signed   By: Staci Righter M.D.   On: 05/18/2017 16:14   Dg Tibia/fibula Left  Result Date: 05/18/2017 CLINICAL DATA:  Patient found down today. Left lower leg pain with movement. Initial encounter. EXAM: LEFT TIBIA AND FIBULA - 2 VIEW COMPARISON:  Plain films left ankle 06/24/2016. FINDINGS: The patient has healed diaphyseal fractures of the tibia and fibula. The fractures are partially seen on the prior examination. Avulsion fracture of  the dorsal calcaneus seen on the prior study has been repaired with a single screw in place. No acute bony abnormality is identified. IMPRESSION: No acute finding. Healed tibial, fibular and calcaneal fractures. Electronically Signed   By: Inge Rise M.D.   On: 05/18/2017 16:11   Ct Head Wo Contrast  Result Date: 05/18/2017 CLINICAL DATA:  EMS reports Home health care nurse called due to being found altered and on kitchen floor unknown LOC unknown down time. Pt experiencing hallucinations. Pt denies pain and has no complaints. Hx DM EXAM: CT HEAD WITHOUT CONTRAST CT CERVICAL SPINE WITHOUT CONTRAST TECHNIQUE: Multidetector CT imaging of the head and cervical spine was performed following the standard protocol without intravenous contrast. Multiplanar CT image reconstructions of the cervical spine were also generated. COMPARISON:  Head CT 03/11/2017, C-spine 05/31/2009 FINDINGS: CT HEAD FINDINGS Brain: No intracranial hemorrhage. No parenchymal contusion. No midline shift or mass effect. Basilar cisterns are patent. No skull base fracture. No fluid in the paranasal sinuses or mastoid air cells. Orbits are normal. Encephalomalacia within the RIGHT temporal lobe unchanged. Prior RIGHT craniotomy noted. Vascular: No hyperdense vessel or unexpected calcification. Skull: Normal. Negative for fracture or focal lesion. Sinuses/Orbits: Paranasal sinuses and mastoid air cells are clear. Orbits are clear. Other: None. CT CERVICAL SPINE FINDINGS Alignment:  Focal kyphosis in the mid cervical spine. Skull base and vertebrae: Normal craniocervical junction. No loss of vertebral body height or disc height. Normal facet articulation. No evidence of fracture. Soft tissues and spinal canal: No prevertebral soft tissue swelling. No perispinal or epidural hematoma. Disc levels: Multiple levels of endplate spurring and joint space narrowing. No acute subluxation. Mild anterolisthesis of C3 on C4 is favored degenerative. Upper  chest: Clear Other: None IMPRESSION: 1. No acute intracranial trauma. 2. Postprocedural encephalomalacia in the RIGHT temporal lobe unchanged. 3. No cervical spine fracture. 4. Multilevel disc osteophytic disease. 5. Focal kyphosis in the mid cervical spine similar to comparison exam. Electronically Signed   By: Suzy Bouchard M.D.   On: 05/18/2017 16:27   Ct Angio Chest Pe W/cm &/or Wo Cm  Result Date: 05/18/2017 CLINICAL DATA:  Chest pain. EXAM: CT ANGIOGRAPHY CHEST WITH CONTRAST TECHNIQUE: Multidetector CT imaging of the chest was performed using the standard protocol during bolus administration of intravenous contrast. Multiplanar CT image reconstructions and MIPs were obtained to evaluate the vascular anatomy. CONTRAST:  100 cc Isovue 370 intravenously. COMPARISON:  Chest radiograph 05/18/2017 FINDINGS: Cardiovascular: Normal heart size. No pericardial effusion. Calcific atherosclerotic disease of the coronary arteries. No evidence of pulmonary embolus. Mediastinum/Nodes: Right hilar lymphadenopathy measuring collectively 20 mm. Milder right peritracheal lymphadenopathy. Enlarged peribronchial lymph node versus a satellite nodule in the right upper lobe. Trachea and esophagus are normal. Lungs/Pleura: Spiculated soft tissue mass in the posterior right upper lobe measures 2.8 x 3.5 x 3.6 cm. Prominent spicules extend to the posterior pleural surface. Hazy ground-glass opacities surround this solid soft tissue mass. The previously mentioned likely malignant right hilar lymphadenopathy narrows slightly the right mainstem bronchus. Nodular and ground-glass opacities  are seen in the right lower lobe, particularly in its superior segment. Example, linear approximately 1 cm in AP dimension ground-glass opacity, image 53/106, series 11. Area of patchy airspace consolidation versus a satellite nodule is seen in the lateral segment of the right middle lobe. Upper Abdomen: No acute abnormality. Musculoskeletal: No  chest wall abnormality. No acute or significant osseous findings. Review of the MIP images confirms the above findings. IMPRESSION: No evidence of pulmonary embolus. Spiculated 3.6 cm right upper lobe soft tissue mass, highly suspicious for primary lung malignancy, with associated right hilar and paratracheal lymphadenopathy. 9 mm soft tissue nodule along the posterior right upper lobe segmental bronchus may represent a satellite nodule versus a peribronchial lymph node. Hazy ground-glass opacities surround the soft tissue mass and may represent direct extension of disease versus surrounding inflammatory changes. Nodular and ground-glass opacities throughout the right lower lobe and right middle lobe may also represent distal spread of disease. These results were called by telephone at the time of interpretation on 05/18/2017 at 8:39 pm to Dr. Sherwood Gambler , who verbally acknowledged these results. Electronically Signed   By: Fidela Salisbury M.D.   On: 05/18/2017 20:42   Ct Cervical Spine Wo Contrast  Result Date: 05/18/2017 CLINICAL DATA:  EMS reports Home health care nurse called due to being found altered and on kitchen floor unknown LOC unknown down time. Pt experiencing hallucinations. Pt denies pain and has no complaints. Hx DM EXAM: CT HEAD WITHOUT CONTRAST CT CERVICAL SPINE WITHOUT CONTRAST TECHNIQUE: Multidetector CT imaging of the head and cervical spine was performed following the standard protocol without intravenous contrast. Multiplanar CT image reconstructions of the cervical spine were also generated. COMPARISON:  Head CT 03/11/2017, C-spine 05/31/2009 FINDINGS: CT HEAD FINDINGS Brain: No intracranial hemorrhage. No parenchymal contusion. No midline shift or mass effect. Basilar cisterns are patent. No skull base fracture. No fluid in the paranasal sinuses or mastoid air cells. Orbits are normal. Encephalomalacia within the RIGHT temporal lobe unchanged. Prior RIGHT craniotomy noted.  Vascular: No hyperdense vessel or unexpected calcification. Skull: Normal. Negative for fracture or focal lesion. Sinuses/Orbits: Paranasal sinuses and mastoid air cells are clear. Orbits are clear. Other: None. CT CERVICAL SPINE FINDINGS Alignment:  Focal kyphosis in the mid cervical spine. Skull base and vertebrae: Normal craniocervical junction. No loss of vertebral body height or disc height. Normal facet articulation. No evidence of fracture. Soft tissues and spinal canal: No prevertebral soft tissue swelling. No perispinal or epidural hematoma. Disc levels: Multiple levels of endplate spurring and joint space narrowing. No acute subluxation. Mild anterolisthesis of C3 on C4 is favored degenerative. Upper chest: Clear Other: None IMPRESSION: 1. No acute intracranial trauma. 2. Postprocedural encephalomalacia in the RIGHT temporal lobe unchanged. 3. No cervical spine fracture. 4. Multilevel disc osteophytic disease. 5. Focal kyphosis in the mid cervical spine similar to comparison exam. Electronically Signed   By: Suzy Bouchard M.D.   On: 05/18/2017 16:27   Dg Foot Complete Left  Result Date: 05/18/2017 CLINICAL DATA:  Patient found down today. Left foot pain with movement. Initial encounter. EXAM: LEFT FOOT - COMPLETE 3+ VIEW COMPARISON:  Plain films left ankle 06/24/2006. FINDINGS: No acute bony or joint abnormality is identified. The patient is status post repair of an avulsion fracture of the calcaneus seen on the prior examination. Bones are osteopenic. IMPRESSION: No acute abnormality. Status post repair of a remote avulsion fracture of the calcaneus. Osteopenia. Electronically Signed   By: Inge Rise M.D.  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  Plan Continue to treat urinary tract infection Follow-up right upper quadrant ultrasound per medicine Tentatively have set him up for navigational bronchoscopy, see Dr. Frederich Balding comments to follow    05/19/2017, 9:40 AM

## 2017-05-19 NOTE — Progress Notes (Signed)
[facility] PROGRESS NOTE  Wyatt Salas ZJI:[id number] DOB: 1951/05/30 DOA: 05/18/2017 PCP: [name], MD  Interim summary and HPI 66 y.o. male, w hx of CAD , Dm2, Seizure and alcohol abuse; who apparently slipped and fell down in the kitchen and had difficulty getting up. Patient Is not sure how long he was on the ground.  Nurses who were filling his medication found him.  Pt was brought to ED for evaluation. Found to have UTI, mild rhabdomyolysis and dehydration/hypercalcemia. Also with new discovery for lung mass.  Assessment/Plan: 1-acute encephalopathy: Metabolic/Toxic in nature and secondary to UTI and alcohol use.. -Patient's mentation improved -Will continue CIWA score and PRN ativan -Continue IV antibiotics -Follow final culture results -Continue and follow clinical response  2-abnormal LFTs -Possible secondary to alcohol use  -will check renal ultrasound -Advised to stop drinking and follow complete alcohol abstinence.    3-history of seizure -Continue Vimpat, Dilantin and Keppra -No active seizure appreciated.  4-diabetes type 2: -Continue sliding scale insulin -Hold metformin while inpatient  5-Lung Mass: -Pulmonary service has been consulted -Plan is to proceed bronchoscopy after infection is clear  6-rhabdomyolysis -Mild and secondary to being lying down for prolonged period of time after his fall at home -Continue IV fluids   Code Status: Full code Family Communication: No family bedside Disposition Plan: Continue IV antibiotics, follow final cultures results, follow clinical response and assess stability and physical functioning by PT.   Consultants:  Pulmonary service  Procedures:  See below for x-ray reports  Antibiotics:   Rocephin  HPI/Subjective: Afebrile, alert and awake x2; denies chest pain or shortness of breath.  Feeling weak.  Objective: Vitals:   05/19/17 0644 05/19/17 1500  BP: 118/63 (!) 111/58  Pulse: 73 90   Resp: 18 18  Temp: 98 F (36.7 C) 98 F (36.7 C)  SpO2: 99% 99%    Intake/Output Summary (Last 24 hours) at 05/19/17 1727 Last data filed at 05/19/17 1500  Gross per 24 hour  Intake             2690 ml  Output              800 ml  Net             1890 ml   Filed Weights   05/18/17 1506  Weight: 71.2 kg (157 lb)    Exam:   General: Afebrile, denies chest pain and shortness of breath.  No nausea, no vomiting.  No seizures   Cardiovascular: S1 and S2, no rubs, no gallops  Respiratory: Good air movement bilaterally, no wheezing, no crackles  Abdomen: Soft, nontender, nondistended, positive bowel sounds.  Musculoskeletal: No edema, no cyanosis or clubbing.  Data Reviewed: Basic Metabolic Panel:  Recent Labs Lab 05/18/17 1629 05/19/17 0514  NA 145 141  K 4.3 4.1  CL 102 107  CO2 25 21*  GLUCOSE 137* 117*  BUN 36* 24*  CREATININE 0.97 0.79  CALCIUM 11.2* 9.3   Liver Function Tests:  Recent Labs Lab 05/18/17 1629 05/19/17 0514  AST 162* 120*  ALT 78* 61  ALKPHOS 72 51  BILITOT 1.1 1.0  PROT 10.6* 7.2  ALBUMIN 5.3* 3.6   CBC:  Recent Labs Lab 05/18/17 1629 05/19/17 0636  WBC 7.3 7.8  NEUTROABS 6.1  --   HGB 16.2 13.4  HCT 48.5 40.4  MCV 86.6 86.0  PLT 231 154   Cardiac Enzymes:  Recent Labs Lab 05/18/17 1629  CKTOTAL 2,035*  TROPONINI  0.05*   CBG:  Recent Labs Lab 05/18/17 1631 05/19/17 0055  GLUCAP 127* 84    Studies: Dg Chest 2 View  Result Date: 05/18/2017 CLINICAL DATA:  Patient found down. Altered mental status. Hallucinations. Confusion. EXAM: CHEST  2 VIEW COMPARISON:  10/14/2016. FINDINGS: The heart size and mediastinal contours are within normal limits. The lungs are free of infiltrates or edema. In the posterior segment RIGHT upper lobe, there is a suspected 2.7 x 3.5 cm mass. This is new/ increased from priors. There is some overlap from telemetry leads. CT chest with contrast recommended. The visualized skeletal  structures are unremarkable. Prominent colonic distention under the RIGHT and LEFT hemidiaphragms simulates pneumoperitoneum. Healed RIGHT-sided rib fractures. IMPRESSION: Query 2.7 x 3.5 cm RIGHT upper lobe mass. CT chest with contrast recommended. Considerable colonic distention under the RIGHT and LEFT hemidiaphragms, simulating pneumoperitoneum. Bowel obstruction cannot be excluded on this chest radiograph. Consider flat and erect abdominal films. Electronically Signed   By: Staci Righter M.D.   On: 05/18/2017 16:14   Dg Tibia/fibula Left  Result Date: 05/18/2017 CLINICAL DATA:  Patient found down today. Left lower leg pain with movement. Initial encounter. EXAM: LEFT TIBIA AND FIBULA - 2 VIEW COMPARISON:  Plain films left ankle 06/24/2016. FINDINGS: The patient has healed diaphyseal fractures of the tibia and fibula. The fractures are partially seen on the prior examination. Avulsion fracture of the dorsal calcaneus seen on the prior study has been repaired with a single screw in place. No acute bony abnormality is identified. IMPRESSION: No acute finding. Healed tibial, fibular and calcaneal fractures. Electronically Signed   By: Inge Rise M.D.   On: 05/18/2017 16:11   Ct Head Wo Contrast  Result Date: 05/18/2017 CLINICAL DATA:  EMS reports Home health care nurse called due to being found altered and on kitchen floor unknown LOC unknown down time. Pt experiencing hallucinations. Pt denies pain and has no complaints. Hx DM EXAM: CT HEAD WITHOUT CONTRAST CT CERVICAL SPINE WITHOUT CONTRAST TECHNIQUE: Multidetector CT imaging of the head and cervical spine was performed following the standard protocol without intravenous contrast. Multiplanar CT image reconstructions of the cervical spine were also generated. COMPARISON:  Head CT 03/11/2017, C-spine 05/31/2009 FINDINGS: CT HEAD FINDINGS Brain: No intracranial hemorrhage. No parenchymal contusion. No midline shift or mass effect. Basilar cisterns  are patent. No skull base fracture. No fluid in the paranasal sinuses or mastoid air cells. Orbits are normal. Encephalomalacia within the RIGHT temporal lobe unchanged. Prior RIGHT craniotomy noted. Vascular: No hyperdense vessel or unexpected calcification. Skull: Normal. Negative for fracture or focal lesion. Sinuses/Orbits: Paranasal sinuses and mastoid air cells are clear. Orbits are clear. Other: None. CT CERVICAL SPINE FINDINGS Alignment:  Focal kyphosis in the mid cervical spine. Skull base and vertebrae: Normal craniocervical junction. No loss of vertebral body height or disc height. Normal facet articulation. No evidence of fracture. Soft tissues and spinal canal: No prevertebral soft tissue swelling. No perispinal or epidural hematoma. Disc levels: Multiple levels of endplate spurring and joint space narrowing. No acute subluxation. Mild anterolisthesis of C3 on C4 is favored degenerative. Upper chest: Clear Other: None IMPRESSION: 1. No acute intracranial trauma. 2. Postprocedural encephalomalacia in the RIGHT temporal lobe unchanged. 3. No cervical spine fracture. 4. Multilevel disc osteophytic disease. 5. Focal kyphosis in the mid cervical spine similar to comparison exam. Electronically Signed   By: Suzy Bouchard M.D.   On: 05/18/2017 16:27   Ct Angio Chest Pe W/cm &/or Wo Cm  Result Date: 05/18/2017 CLINICAL DATA:  Chest pain. EXAM: CT ANGIOGRAPHY CHEST WITH CONTRAST TECHNIQUE: Multidetector CT imaging of the chest was performed using the standard protocol during bolus administration of intravenous contrast. Multiplanar CT image reconstructions and MIPs were obtained to evaluate the vascular anatomy. CONTRAST:  100 cc Isovue 370 intravenously. COMPARISON:  Chest radiograph 05/18/2017 FINDINGS: Cardiovascular: Normal heart size. No pericardial effusion. Calcific atherosclerotic disease of the coronary arteries. No evidence of pulmonary embolus. Mediastinum/Nodes: Right hilar lymphadenopathy  measuring collectively 20 mm. Milder right peritracheal lymphadenopathy. Enlarged peribronchial lymph node versus a satellite nodule in the right upper lobe. Trachea and esophagus are normal. Lungs/Pleura: Spiculated soft tissue mass in the posterior right upper lobe measures 2.8 x 3.5 x 3.6 cm. Prominent spicules extend to the posterior pleural surface. Hazy ground-glass opacities surround this solid soft tissue mass. The previously mentioned likely malignant right hilar lymphadenopathy narrows slightly the right mainstem bronchus. Nodular and ground-glass opacities are seen in the right lower lobe, particularly in its superior segment. Example, linear approximately 1 cm in AP dimension ground-glass opacity, image 53/106, series 11. Area of patchy airspace consolidation versus a satellite nodule is seen in the lateral segment of the right middle lobe. Upper Abdomen: No acute abnormality. Musculoskeletal: No chest wall abnormality. No acute or significant osseous findings. Review of the MIP images confirms the above findings. IMPRESSION: No evidence of pulmonary embolus. Spiculated 3.6 cm right upper lobe soft tissue mass, highly suspicious for primary lung malignancy, with associated right hilar and paratracheal lymphadenopathy. 9 mm soft tissue nodule along the posterior right upper lobe segmental bronchus may represent a satellite nodule versus a peribronchial lymph node. Hazy ground-glass opacities surround the soft tissue mass and may represent direct extension of disease versus surrounding inflammatory changes. Nodular and ground-glass opacities throughout the right lower lobe and right middle lobe may also represent distal spread of disease. These results were called by telephone at the time of interpretation on 05/18/2017 at 8:39 pm to Dr. Sherwood Gambler , who verbally acknowledged these results. Electronically Signed   By: Fidela Salisbury M.D.   On: 05/18/2017 20:42   Ct Cervical Spine Wo  Contrast  Result Date: 05/18/2017 CLINICAL DATA:  EMS reports Home health care nurse called due to being found altered and on kitchen floor unknown LOC unknown down time. Pt experiencing hallucinations. Pt denies pain and has no complaints. Hx DM EXAM: CT HEAD WITHOUT CONTRAST CT CERVICAL SPINE WITHOUT CONTRAST TECHNIQUE: Multidetector CT imaging of the head and cervical spine was performed following the standard protocol without intravenous contrast. Multiplanar CT image reconstructions of the cervical spine were also generated. COMPARISON:  Head CT 03/11/2017, C-spine 05/31/2009 FINDINGS: CT HEAD FINDINGS Brain: No intracranial hemorrhage. No parenchymal contusion. No midline shift or mass effect. Basilar cisterns are patent. No skull base fracture. No fluid in the paranasal sinuses or mastoid air cells. Orbits are normal. Encephalomalacia within the RIGHT temporal lobe unchanged. Prior RIGHT craniotomy noted. Vascular: No hyperdense vessel or unexpected calcification. Skull: Normal. Negative for fracture or focal lesion. Sinuses/Orbits: Paranasal sinuses and mastoid air cells are clear. Orbits are clear. Other: None. CT CERVICAL SPINE FINDINGS Alignment:  Focal kyphosis in the mid cervical spine. Skull base and vertebrae: Normal craniocervical junction. No loss of vertebral body height or disc height. Normal facet articulation. No evidence of fracture. Soft tissues and spinal canal: No prevertebral soft tissue swelling. No perispinal or epidural hematoma. Disc levels: Multiple levels of endplate spurring and joint space narrowing. No acute  subluxation. Mild anterolisthesis of C3 on C4 is favored degenerative. Upper chest: Clear Other: None IMPRESSION: 1. No acute intracranial trauma. 2. Postprocedural encephalomalacia in the RIGHT temporal lobe unchanged. 3. No cervical spine fracture. 4. Multilevel disc osteophytic disease. 5. Focal kyphosis in the mid cervical spine similar to comparison exam. Electronically  Signed   By: Suzy Bouchard M.D.   On: 05/18/2017 16:27   Dg Foot Complete Left  Result Date: 05/18/2017 CLINICAL DATA:  Patient found down today. Left foot pain with movement. Initial encounter. EXAM: LEFT FOOT - COMPLETE 3+ VIEW COMPARISON:  Plain films left ankle 06/24/2006. FINDINGS: No acute bony or joint abnormality is identified. The patient is status post repair of an avulsion fracture of the calcaneus seen on the prior examination. Bones are osteopenic. IMPRESSION: No acute abnormality. Status post repair of a remote avulsion fracture of the calcaneus. Osteopenia. Electronically Signed   By: Inge Rise M.D.   On: 05/18/2017 16:17   US Abdomen Limited Ruq  Result Date: 05/19/2017 CLINICAL DATA:  Elevated LFTs EXAM: ULTRASOUND ABDOMEN LIMITED RIGHT UPPER QUADRANT COMPARISON:  None. FINDINGS: Gallbladder: No gallstones or wall thickening visualized. No sonographic Murphy sign noted by sonographer. Common bile duct: Diameter: 3.6 mm Liver: No focal lesion identified. Within normal limits in parenchymal echogenicity. Portal vein is patent on color Doppler imaging with normal direction of blood flow towards the liver. Cystic lesion is noted within the right kidney, incompletely evaluated on this exam. IMPRESSION: No acute abnormality is noted in the right upper quadrant. Cystic lesion within the right kidney incompletely evaluated on this exam. Electronically Signed   By: Inez Catalina M.D.   On: 05/19/2017 11:43    Scheduled Meds: . atorvastatin  40 mg Oral q1800  . docusate sodium  200 mg Oral BID  . enoxaparin (LOVENOX) injection  40 mg Subcutaneous QHS  . feeding supplement (ENSURE ENLIVE)  237 mL Oral BID BM  . folic acid  1 mg Oral Daily  . galantamine  8 mg Oral BID WC  . insulin aspart  0-9 Units Subcutaneous TID WC  . insulin detemir  5 Units Subcutaneous QHS  . lacosamide  100 mg Oral BID  . levETIRAcetam  1,500 mg Oral BID  . lisinopril  5 mg Oral Daily  . multivitamin  with minerals  1 tablet Oral Daily  . phenytoin  30 mg Oral BID  . thiamine  100 mg Oral Daily   Or  . thiamine  100 mg Intravenous Daily   Continuous Infusions: . sodium chloride    . cefTRIAXone (ROCEPHIN)  IV      Active Problems:   Altered mental status    Time spent: 30 minutes    Barton Dubois  [facility] Pager [phone]. If 7PM-7AM, please contact night-coverage at [email or web address], password [facility] 05/19/2017, 5:27 PM  LOS: 1 day

## 2017-05-19 NOTE — Progress Notes (Signed)
   05/19/17 1600  Clinical Encounter Type  Visited With Patient  Visit Type Initial  Referral From Nurse  Consult/Referral To Chaplain  Spiritual Encounters  Spiritual Needs Prayer;Emotional   Responding to a [facility] for an AD.  Initial visit patient was asleep, on this visit he was sitting up and alert.  We discussed briefly the AD and he seems kind of interested but not yet.  Will pass this on to the other Chaplains.  He was appreciative of the visit and offered some about his family.  We prayed together.  Will follow as needed. Chaplain Katherene Ponto

## 2017-05-20 DIAGNOSIS — K7689 Other specified diseases of liver: Secondary | ICD-10-CM

## 2017-05-20 DIAGNOSIS — F1010 Alcohol abuse, uncomplicated: Secondary | ICD-10-CM

## 2017-05-20 DIAGNOSIS — R4182 Altered mental status, unspecified: Secondary | ICD-10-CM

## 2017-05-20 LAB — GLUCOSE, CAPILLARY
GLUCOSE-CAPILLARY: 112 mg/dL — AB (ref 65–99)
GLUCOSE-CAPILLARY: 84 mg/dL (ref 65–99)
GLUCOSE-CAPILLARY: 109 mg/dL — AB (ref 65–99)
GLUCOSE-CAPILLARY: 149 mg/dL — AB (ref 65–99)
Glucose-Capillary: 123 mg/dL — ABNORMAL HIGH (ref 65–99)
Glucose-Capillary: 110 mg/dL — ABNORMAL HIGH (ref 65–99)
Glucose-Capillary: 135 mg/dL — ABNORMAL HIGH (ref 65–99)

## 2017-05-20 LAB — HEPATITIS PANEL, ACUTE
HCV Ab: >11 s/co ratio — ABNORMAL HIGH (ref 0.0–0.9)
HEP B S AG: NEGATIVE
Hep A IgM: NEGATIVE
Hep B C IgM: NEGATIVE

## 2017-05-20 NOTE — Progress Notes (Signed)
[facility] PROGRESS NOTE  Wyatt Salas NFA:[id number] DOB: 12/17/1950 DOA: 05/18/2017 PCP: [name], MD  Interim summary and HPI 66 y.o. male, w hx of CAD , Dm2, Seizure and alcohol abuse; who apparently slipped and fell down in the kitchen and had difficulty getting up. Patient Is not sure how long he was on the ground.  Nurses who were filling his medication found him.  Pt was brought to ED for evaluation. Found to have UTI, mild rhabdomyolysis and dehydration/hypercalcemia. Also with new discovery for lung mass.  Assessment/Plan: 1-acute encephalopathy: Metabolic/Toxic in nature and secondary to UTI and alcohol use.. -Patient's mentation improved and probably back to baseline now. -Will continue CIWA and PRN ativan -Continue IV antibiotics while waiting culture results -continue IVF's, but adjust rate -Continue and follow clinical response  2-abnormal LFTs -Possible secondary to alcohol use  -RUQ ultrasound demonstrated no acute abnormalities -Advised to stop drinking and follow complete alcohol abstinence.   -will follow CMET in am  3-history of seizure -Continue Vimpat, Dilantin and Keppra -No active seizure appreciated. -continue seizure precaution   4-diabetes type 2: -Continue sliding scale insulin -will continue Holding metformin while inpatient  5-Lung Mass: -Pulmonary service has been consulted -Plan is to proceed bronchoscopy after infection is clear; but pulmonary service will like PFT's evaluation and assessed possibility of treatment base on patient performance.  6-rhabdomyolysis -Mild and secondary to being lying down for prolonged period of time after his fall at home -Continue IV fluids -renal function WNL and hypercalcemia resolved.  7-physical deconditioning -PT ordered -will follow recommendations   Code Status: Full code Family Communication: No family a bedside; but pulmonary service discussed and update sister over the  phone. Disposition Plan: Continue IV antibiotics, follow final cultures results, follow clinical response and assess stability and physical functioning by PT.   Consultants:  Pulmonary service  Procedures:  See below for x-ray reports  Antibiotics:   Rocephin  HPI/Subjective: Afebrile, no CP, no nausea, no vomiting and overall feeling better. Patient with lack of recollection from discussion yesterday or unable to remember what he had for breakfast. Short recent memory.  Objective: Vitals:   05/20/17 0459 05/20/17 1306  BP: 108/68 116/78  Pulse: 71 78  Resp: 18 18  Temp: 98.2 F (36.8 C) 98.8 F (37.1 C)  SpO2: 100% 100%    Intake/Output Summary (Last 24 hours) at 05/20/17 2004 Last data filed at 05/20/17 1116  Gross per 24 hour  Intake           2097.5 ml  Output              400 ml  Net           1697.5 ml   Filed Weights   05/18/17 1506 05/20/17 0629  Weight: 71.2 kg (157 lb) 57.5 kg (126 lb 12.2 oz)    Exam:   General: no fever, no CP, no nausea, no vomiting and denies SOB. No seizure.   Cardiovascular: S1 and S2, no rubs, no gallops  Respiratory: no wheezing, no crackles, good air movement  Abdomen: soft, NT, ND, positive BS  Musculoskeletal: no edema, no cyanosis, no clubbing.   Data Reviewed: Basic Metabolic Panel:  Recent Labs Lab 05/18/17 1629 05/19/17 0514  NA 145 141  K 4.3 4.1  CL 102 107  CO2 25 21*  GLUCOSE 137* 117*  BUN 36* 24*  CREATININE 0.97 0.79  CALCIUM 11.2* 9.3   Liver Function Tests:  Recent Labs Lab 05/18/17  1629 05/19/17 0514  AST 162* 120*  ALT 78* 61  ALKPHOS 72 51  BILITOT 1.1 1.0  PROT 10.6* 7.2  ALBUMIN 5.3* 3.6   CBC:  Recent Labs Lab 05/18/17 1629 05/19/17 0636  WBC 7.3 7.8  NEUTROABS 6.1  --   HGB 16.2 13.4  HCT 48.5 40.4  MCV 86.6 86.0  PLT 231 154   Cardiac Enzymes:  Recent Labs Lab 05/18/17 1629  CKTOTAL 2,035*  TROPONINI 0.05*   CBG:  Recent Labs Lab 05/19/17 1706  05/19/17 2156 05/20/17 0733 05/20/17 1157 05/20/17 1655  GLUCAP 112* 119* 84 109* 135*    Studies: Ct Angio Chest Pe W/cm &/or Wo Cm  Result Date: 05/18/2017 CLINICAL DATA:  Chest pain. EXAM: CT ANGIOGRAPHY CHEST WITH CONTRAST TECHNIQUE: Multidetector CT imaging of the chest was performed using the standard protocol during bolus administration of intravenous contrast. Multiplanar CT image reconstructions and MIPs were obtained to evaluate the vascular anatomy. CONTRAST:  100 cc Isovue 370 intravenously. COMPARISON:  Chest radiograph 05/18/2017 FINDINGS: Cardiovascular: Normal heart size. No pericardial effusion. Calcific atherosclerotic disease of the coronary arteries. No evidence of pulmonary embolus. Mediastinum/Nodes: Right hilar lymphadenopathy measuring collectively 20 mm. Milder right peritracheal lymphadenopathy. Enlarged peribronchial lymph node versus a satellite nodule in the right upper lobe. Trachea and esophagus are normal. Lungs/Pleura: Spiculated soft tissue mass in the posterior right upper lobe measures 2.8 x 3.5 x 3.6 cm. Prominent spicules extend to the posterior pleural surface. Hazy ground-glass opacities surround this solid soft tissue mass. The previously mentioned likely malignant right hilar lymphadenopathy narrows slightly the right mainstem bronchus. Nodular and ground-glass opacities are seen in the right lower lobe, particularly in its superior segment. Example, linear approximately 1 cm in AP dimension ground-glass opacity, image 53/106, series 11. Area of patchy airspace consolidation versus a satellite nodule is seen in the lateral segment of the right middle lobe. Upper Abdomen: No acute abnormality. Musculoskeletal: No chest wall abnormality. No acute or significant osseous findings. Review of the MIP images confirms the above findings. IMPRESSION: No evidence of pulmonary embolus. Spiculated 3.6 cm right upper lobe soft tissue mass, highly suspicious for primary lung  malignancy, with associated right hilar and paratracheal lymphadenopathy. 9 mm soft tissue nodule along the posterior right upper lobe segmental bronchus may represent a satellite nodule versus a peribronchial lymph node. Hazy ground-glass opacities surround the soft tissue mass and may represent direct extension of disease versus surrounding inflammatory changes. Nodular and ground-glass opacities throughout the right lower lobe and right middle lobe may also represent distal spread of disease. These results were called by telephone at the time of interpretation on 05/18/2017 at 8:39 pm to Dr. Sherwood Gambler , who verbally acknowledged these results. Electronically Signed   By: Fidela Salisbury M.D.   On: 05/18/2017 20:42   US Abdomen Limited Ruq  Result Date: 05/19/2017 CLINICAL DATA:  Elevated LFTs EXAM: ULTRASOUND ABDOMEN LIMITED RIGHT UPPER QUADRANT COMPARISON:  None. FINDINGS: Gallbladder: No gallstones or wall thickening visualized. No sonographic Murphy sign noted by sonographer. Common bile duct: Diameter: 3.6 mm Liver: No focal lesion identified. Within normal limits in parenchymal echogenicity. Portal vein is patent on color Doppler imaging with normal direction of blood flow towards the liver. Cystic lesion is noted within the right kidney, incompletely evaluated on this exam. IMPRESSION: No acute abnormality is noted in the right upper quadrant. Cystic lesion within the right kidney incompletely evaluated on this exam. Electronically Signed   By: Linus Mako.D.  On: 05/19/2017 11:43    Scheduled Meds: . atorvastatin  40 mg Oral q1800  . docusate sodium  200 mg Oral BID  . enoxaparin (LOVENOX) injection  40 mg Subcutaneous QHS  . feeding supplement (ENSURE ENLIVE)  237 mL Oral BID BM  . folic acid  1 mg Oral Daily  . galantamine  8 mg Oral BID WC  . insulin aspart  0-9 Units Subcutaneous TID WC  . insulin detemir  5 Units Subcutaneous QHS  . lacosamide  100 mg Oral BID  .  levETIRAcetam  1,500 mg Oral BID  . lisinopril  5 mg Oral Daily  . multivitamin with minerals  1 tablet Oral Daily  . phenytoin  30 mg Oral BID  . thiamine  100 mg Oral Daily   Or  . thiamine  100 mg Intravenous Daily   Continuous Infusions: . sodium chloride 75 mL/hr at 05/20/17 1310  . cefTRIAXone (ROCEPHIN)  IV Stopped (05/19/17 2151)    Active Problems:   Altered mental status    Time spent: 30 minutes    Barton Dubois  [facility] Pager [phone]. If 7PM-7AM, please contact night-coverage at [email or web address], password [facility] 05/20/2017, 8:04 PM  LOS: 2 days

## 2017-05-20 NOTE — Progress Notes (Addendum)
[facility] PCCM  S: No acute events, doesn't recall having a conversation about his lungs yesterday, says he was told he fell and he knows he is in a hospital, but apart from that he says that he doesn't know why he is here  Past Medical History:  Diagnosis Date  . Diabetes mellitus without complication ([place])   . Diastolic dysfunction    a. ECHO 07/2013 EF 55-60% and grade 1 diastolic dysfunction  . NSTEMI (non-ST elevated myocardial infarction) ([place])    problem list 01/2014  . Rhabdomyolysis 01/2014  . Seizures ([place])   . Syncope 01/2014  . Traumatic brain injury ([place])     Vitals:   05/19/17 1500 05/19/17 2103 05/20/17 0459 05/20/17 0629  BP: (!) 111/58 127/66 108/68   Pulse: 90 76 71   Resp: 18 18 18    Temp: 98 F (36.7 C) 98.2 F (36.8 C) 98.2 F (36.8 C)   TempSrc: Oral Oral Oral   SpO2: 99% 100% 100%   Weight:    57.5 kg (126 lb 12.2 oz)  Height:    6\' 1"  (1.854 m)   General:  Resting comfortably in bed HENT: NCAT OP clear PULM: CTA B, normal effort CV: RRR, no mgr GI: BS+, soft, nontender MSK: normal bulk and tone Neuro: speech is clear and coherent, he is oriented to hospital but not situation, follows commands, awake, alert  CT chest images reviewed showing a RUL mass, enlarged 2R lymph node, 10R Lymph node  CBC    Component Value Date/Time   WBC 7.8 05/19/2017 0636   RBC 4.70 05/19/2017 0636   HGB 13.4 05/19/2017 0636   HCT 40.4 05/19/2017 0636   PLT 154 05/19/2017 0636   MCV 86.0 05/19/2017 0636   MCH 28.5 05/19/2017 0636   MCHC 33.2 05/19/2017 0636   RDW 13.0 05/19/2017 0636   LYMPHSABS 0.9 05/18/2017 1629   MONOABS 0.3 05/18/2017 1629   EOSABS 0.0 05/18/2017 1629   BASOSABS 0.0 05/18/2017 1629   BMET    Component Value Date/Time   NA 141 05/19/2017 0514   K 4.1 05/19/2017 0514   CL 107 05/19/2017 0514   CO2 21 (L) 05/19/2017 0514   GLUCOSE 117 (H) 05/19/2017 0514   BUN 24 (H) 05/19/2017 0514   CREATININE 0.79 05/19/2017 0514   CALCIUM 9.3 05/19/2017  0514   GFRNONAA >60 05/19/2017 0514   GFRAA >60 05/19/2017 0514   Impression/Plan:  Acute encephalopathy: resolved, now back to baseline Memory loss: 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  We will make arrangements for an outpatient follow up with either me or Dr. Lamonte Sakai to discuss further.  He has an appointment on November 8 at 9:00 with Tammy Parret  > 40 minutes spent discussing his situation with family and the primary service MD  Roselie Awkward, MD [facility] Pager: [phone] Cell: [phone] After 3pm or if no response, call [phone]

## 2017-05-21 LAB — GLUCOSE, CAPILLARY
GLUCOSE-CAPILLARY: 102 mg/dL — AB (ref 65–99)
GLUCOSE-CAPILLARY: 108 mg/dL — AB (ref 65–99)
Glucose-Capillary: 127 mg/dL — ABNORMAL HIGH (ref 65–99)
Glucose-Capillary: 129 mg/dL — ABNORMAL HIGH (ref 65–99)

## 2017-05-21 MED ORDER — ACETAMINOPHEN 325 MG PO TABS
650.0000 mg | ORAL_TABLET | Freq: Four times a day (QID) | ORAL | Status: DC | PRN
  Administered 2017-05-21 – 2017-05-23 (×4): 650 mg via ORAL
  Filled 2017-05-21 (×4): qty 2

## 2017-05-21 NOTE — Evaluation (Signed)
Physical Therapy Evaluation Patient Details Name: Wyatt Salas MRN: [id number] DOB: 1951-05-29 Today's Date: 05/21/2017   History of Present Illness  66 y.o. male with hx of CAD , Dm2, Seizure, SDH with R craniotomy,  and alcohol abuse; who apparently slipped and fell down in the kitchen and had difficulty getting up and admitted with acute encephalopathy  Clinical Impression  Pt admitted with above diagnosis. Pt currently with functional limitations due to the deficits listed below (see PT Problem List).  Pt will benefit from skilled PT to increase their independence and safety with mobility to allow discharge to the venue listed below.  Pt with old BM in bed upon entering room so assisted with hygiene and pericare with NT.  Pt assisted over to recliner to finish breakfast.  Pt reports he does not remember fall.  He typically uses quad cane to mobilize in home and w/c for community.  Pt would benefit from SNF upon d/c.     Follow Up Recommendations SNF;Supervision/Assistance - 24 hour    Equipment Recommendations  None recommended by PT    Recommendations for Other Services       Precautions / Restrictions Precautions Precautions: Fall Restrictions Weight Bearing Restrictions: No      Mobility  Bed Mobility Overal bed mobility: Needs Assistance Bed Mobility: Rolling;Supine to Sit Rolling: Supervision   Supine to sit: Mod assist     General bed mobility comments: slight assist to bring LEs over EOB and trunk upright  Transfers Overall transfer level: Needs assistance Equipment used: 2 person hand held assist Transfers: Sit to/from [employer] Transfers Sit to Stand: Mod assist;+2 safety/equipment Stand pivot transfers: Mod assist;+2 safety/equipment       General transfer comment: assist to rise and steady, declined using RW but required bil UE support  Ambulation/Gait                Stairs            Wheelchair Mobility    Modified Rankin  (Stroke Patients Only)       Balance Overall balance assessment: History of Falls                                           Pertinent Vitals/Pain Pain Assessment: No/denies pain    Home Living Family/patient expects to be discharged to:: Private residence Living Arrangements: Alone Available Help at Discharge: Friend(s) Type of Home: Apartment Home Access: Elevator     Home Layout: One level Home Equipment: Hand held shower head;Tub bench;Walker - 2 wheels;Cane - quad;Wheelchair - power;Wheelchair - manual      Prior Function Level of Independence: Independent with assistive device(s)         Comments: uses manual w/c mostly. Walks short distances with quad cane.  Sister gets Chief Executive Officer        Extremity/Trunk Assessment        Lower Extremity Assessment Lower Extremity Assessment: Generalized weakness (diffuse muscle atrophy)       Communication   Communication: No difficulties  Cognition Arousal/Alertness: Awake/alert Behavior During Therapy: WFL for tasks assessed/performed Overall Cognitive Status: Within Functional Limits for tasks assessed                                 General Comments: appropriate  during sesssion, follows commands      General Comments      Exercises     Assessment/Plan    PT Assessment Patient needs continued PT services  PT Problem List Decreased strength;Decreased mobility;Decreased range of motion;Decreased balance;Decreased knowledge of use of DME;Decreased activity tolerance       PT Treatment Interventions DME instruction;Gait training;Therapeutic exercise;Therapeutic activities;Functional mobility training;Balance training;Wheelchair mobility training;Patient/family education    PT Goals (Current goals can be found in the Care Plan section)  Acute Rehab PT Goals PT Goal Formulation: With patient Time For Goal Achievement: 06/04/17 Potential to Achieve Goals:  Good    Frequency Min 3X/week   Barriers to discharge        Co-evaluation               AM-PAC PT "6 Clicks" Daily Activity  Outcome Measure Difficulty turning over in bed (including adjusting bedclothes, sheets and blankets)?: None Difficulty moving from lying on back to sitting on the side of the bed? : Unable Difficulty sitting down on and standing up from a chair with arms (e.g., wheelchair, bedside commode, etc,.)?: Unable Help needed moving to and from a bed to chair (including a wheelchair)?: A Lot Help needed walking in hospital room?: Total Help needed climbing 3-5 steps with a railing? : Total 6 Click Score: 10    End of Session   Activity Tolerance: Patient tolerated treatment well Patient left: in chair;with chair alarm set;with call bell/phone within reach;with nursing/sitter in room Nurse Communication: Mobility status PT Visit Diagnosis: Difficulty in walking, not elsewhere classified (R26.2)    Time: 3016-0109 PT Time Calculation (min) (ACUTE ONLY): 21 min   Charges:   PT Evaluation $PT Eval Low Complexity: 1 Low     PT G CodesCarmelia Bake, PT, DPT 05/21/2017 Pager: [phone]  York Ram E 05/21/2017, 11:51 AM

## 2017-05-21 NOTE — Clinical Social Work Note (Signed)
Clinical Social Work Assessment  Patient Details  Name: Wyatt Salas MRN: [id number] Date of Birth: 1951-06-26  Date of referral:  05/21/17               Reason for consult:  Facility Placement                Permission sought to share information with:    Permission granted to share information::     Name::        Agency::     Relationship::     Contact Information:     Housing/Transportation Living arrangements for the past 2 months:  Single Family Home Source of Information:  Patient Patient Interpreter Needed:  None Criminal Activity/Legal Involvement Pertinent to Current Situation/Hospitalization:  No - Comment as needed Significant Relationships:  Friend, Adult Children, Siblings, Community Support Lives with:  Self Do you feel safe going back to the place where you live?  Yes Need for family participation in patient care:  No (Coment)  Care giving concerns:  Pt from home where he resides alone. [facility] center. Pt reports at baseline he uses a walker to ambulate and completes ADLs independently. Reports he does not drive so he rides his motorized wheelchair to grocery store.  Reports he has a close friend and his pastor who are his primary emotional supports and his services through the [facility] RN/aide) are helpful also.  Pt has large family who lives in [place].    Social Worker assessment / plan:  CSW consulted to assess for potential SNF placement for skilled PT.  Met with pt at bedside. Pt alert and oriented at time of assessment. Only indication of altered mental status with CSW was repeating a story about his parents as if not remembering telling it before (the story was relevant to CSW's questions re: his family hx, however) Pt reports he uses alcohol every other day (2 40oz beers at a time) for the past 2 years. States he has drubk alcohol off and on since adolescence with some long periods of sobriety.  States he last drank 05/14/17 and denies any current w/d sx. Reports "I wanna stay sober this time, this isn't good for me." Reports he feels he is not interested in formal treatment programs but likes AA and his church for support with sobriety.  Discussed PT recommendation for SNF at DC and pt is agreeable. He has no facility preference. CSW explained referral and insurance pre-authorization process. Pt states if insurance Josem Kaufmann is not granted "he would just as soon plan to go home." CSW obtained pre-existing PASSR # from [place] must (noted that pt's birthday in [place] must system is 1953 rather than [birth year]- pt reports birthday 55 but he does not have his military ID card with him at hospital for CSW to fax to [place] must in order for their system to be able to change birthdate) Completed FL2 and referred to area SNFs- will follow up for bed choice and initiate [employer] insurance authorization.  Plan: referred to SNF for ST rehab at DC.  Employment status:  Retired Forensic scientist:  Information systems manager, IT sales professional (pt reports VA services- coverage not on file) PT Recommendations:  [place], [place] / Referral to community resources:  [place]  Patient/Family's Response to care:  Pt engaged and appreciative of care  Patient/Family's Understanding of and Emotional Response to Diagnosis, Current Treatment, and Prognosis:  Pt  demonstrates adequate understanding of plan. Was very interactive- asked appropriate questions and, as noted above, other than repeating himself, mental status was unremarkable. Emotionally calm and happy. Re: [place] plan for SNF, states, "That sounds good, but I'd just as well go home too."  Emotional Assessment Appearance:  Appears stated age Attitude/Demeanor/Rapport:   (engaged, welcoming) Affect (typically observed):  Accepting, Adaptable, Calm Orientation:  Oriented to Self, Oriented to Place, Oriented to  Time, Oriented to  Situation Alcohol / Substance use:  Alcohol Use Psych involvement (Current and /or in the community):  No (Comment)  Discharge Needs  Concerns to be addressed:  Discharge Planning Concerns Readmission within the last 30 days:  No Current discharge risk:  Lives alone Barriers to Discharge:  Continued Medical Work up, [employer], LCSW 05/21/2017, 1:41 PM  [phone]

## 2017-05-21 NOTE — NC FL2 (Signed)
[place] LEVEL OF CARE SCREENING TOOL     IDENTIFICATION  Patient Name: Wyatt Salas Birthdate: [birth year] Sex: male Admission Date (Current Location): 05/18/2017  [facility] and [place] Number:  Herbalist and Address:  [facility],  [address], [place]      Provider Number: [id number]  Attending Physician Name and Address:  Barton Dubois, MD  Relative Name and Phone Number:       Current Level of Care: Hospital Recommended Level of Care: [place] Prior Approval Number:    Date Approved/Denied:   PASRR Number:    Discharge Plan: SNF    Current Diagnoses: Patient Active Problem List   Diagnosis Date Noted  . Altered mental status 05/18/2017  . Right sided weakness 10/14/2016  . Acute lower UTI 10/14/2016  . Elevated troponin 10/14/2016  . Type 2 diabetes mellitus with hyperlipidemia ([place]) 10/14/2016  . Fall at home--mechanical 01/26/2014  . Elevated CK 01/26/2014  . Seizure disorder ([place]) 07/26/2013  . NSTEMI (non-ST elevated myocardial infarction) ([place]) 07/26/2013  . Hyperlipidemia 07/26/2013  . Aspiration pneumonia ([place]) 07/26/2013  . Rhabdomyolysis 07/26/2013    Orientation RESPIRATION BLADDER Height & Weight     Self, Time, Situation, Place  Normal External catheter, Continent Weight: 126 lb 12.2 oz (57.5 kg) Height:  6\' 1"  (185.4 cm)  BEHAVIORAL SYMPTOMS/MOOD NEUROLOGICAL BOWEL NUTRITION STATUS      Continent Diet (Heart healthy, carb modified )  AMBULATORY STATUS COMMUNICATION OF NEEDS Skin   Extensive Assist Verbally Normal                       Personal Care Assistance Level of Assistance  Bathing, Feeding, Dressing Bathing Assistance: Limited assistance Feeding assistance: Independent Dressing Assistance: Independent     Functional Limitations Info  Sight, Hearing, Speech Sight Info: Adequate Hearing Info: Adequate Speech Info: Adequate    SPECIAL CARE FACTORS  FREQUENCY  PT (By licensed PT), OT (By licensed OT)     PT Frequency: 5x OT Frequency: 5x            Contractures Contractures Info: Not present    Additional Factors Info  Code Status, Allergies Code Status Info: full code Allergies Info: shellfish           Current Medications (05/21/2017):  This is the current hospital active medication list Current Facility-Administered Medications  Medication Dose Route Frequency Provider Last Rate Last Dose  . 0.9 %  sodium chloride infusion   Intravenous Continuous Barton Dubois, MD 75 mL/hr at 05/21/17 0200    . acetaminophen (TYLENOL) tablet 650 mg  650 mg Oral Q6H PRN Barton Dubois, MD      . atorvastatin (LIPITOR) tablet 40 mg  40 mg Oral q1800 Jani Gravel, MD   40 mg at 05/20/17 1711  . cefTRIAXone (ROCEPHIN) 1 g in dextrose 5 % 50 mL IVPB  1 g Intravenous Q24H Jani Gravel, MD   Stopped at 05/20/17 2250  . docusate sodium (COLACE) capsule 200 mg  200 mg Oral BID Jani Gravel, MD   200 mg at 05/21/17 0844  . enoxaparin (LOVENOX) injection 40 mg  40 mg Subcutaneous QHS Jani Gravel, MD   40 mg at 05/20/17 2120  . feeding supplement (ENSURE ENLIVE) (ENSURE ENLIVE) liquid 237 mL  237 mL Oral BID BM Jani Gravel, MD   237 mL at 05/21/17 0847  . folic acid (FOLVITE) tablet 1 mg  1 mg Oral Daily  Jani Gravel, MD   1 mg at 05/21/17 0844  . galantamine (RAZADYNE) tablet 8 mg  8 mg Oral BID WC Jani Gravel, MD   8 mg at 05/21/17 0843  . insulin aspart (novoLOG) injection 0-9 Units  0-9 Units Subcutaneous TID WC Jani Gravel, MD   1 Units at 05/21/17 1157  . insulin detemir (LEVEMIR) injection 5 Units  5 Units Subcutaneous QHS Barton Dubois, MD   5 Units at 05/20/17 2120  . lacosamide (VIMPAT) tablet 100 mg  100 mg Oral BID Jani Gravel, MD   100 mg at 05/21/17 0844  . levETIRAcetam (KEPPRA) tablet 1,500 mg  1,500 mg Oral BID Jani Gravel, MD   1,500 mg at 05/21/17 0843  . lisinopril (PRINIVIL,ZESTRIL) tablet 5 mg  5 mg Oral Daily Jani Gravel, MD   5 mg at  05/21/17 0844  . LORazepam (ATIVAN) tablet 1 mg  1 mg Oral Q6H PRN Jani Gravel, MD       Or  . LORazepam (ATIVAN) injection 1 mg  1 mg Intravenous Q6H PRN Jani Gravel, MD      . multivitamin with minerals tablet 1 tablet  1 tablet Oral Daily Jani Gravel, MD   1 tablet at 05/21/17 0844  . phenytoin (DILANTIN) ER capsule 30 mg  30 mg Oral BID Jani Gravel, MD   30 mg at 05/21/17 0844  . polyvinyl alcohol (LIQUIFILM TEARS) 1.4 % ophthalmic solution 1 drop  1 drop Both Eyes BID PRN Jani Gravel, MD      . thiamine (VITAMIN B-1) tablet 100 mg  100 mg Oral Daily Jani Gravel, MD   100 mg at 05/21/17 8864     Discharge Medications: Please see discharge summary for a list of discharge medications.  Relevant Imaging Results:  Relevant Lab Results:   Additional Information SS# [id number]  Nila Nephew, LCSW

## 2017-05-21 NOTE — Progress Notes (Signed)
[facility] PROGRESS NOTE  Wyatt Salas MVH:[id number] DOB: 1951/05/05 DOA: 05/18/2017 PCP: [name], MD  Interim summary and HPI 66 y.o. male, w hx of CAD , Dm2, Seizure and alcohol abuse; who apparently slipped and fell down in the kitchen and had difficulty getting up. Patient Is not sure how long he was on the ground.  Nurses who were filling his medication found him.  Pt was brought to ED for evaluation. Found to have UTI, mild rhabdomyolysis and dehydration/hypercalcemia. Also with new discovery for lung mass.  Assessment/Plan: 1-acute encephalopathy: Metabolic/Toxic in nature and secondary to UTI and alcohol use.. -Patient's mentation improved and probably back to baseline now. -Will continue CIWA and PRN ativan -Continue IV antibiotics while waiting culture results -continue IVF's, but adjust rate -Continue and follow clinical response  2-abnormal LFTs -Possible secondary to alcohol use  -RUQ ultrasound demonstrated no acute abnormalities -Advised to stop drinking and follow complete alcohol abstinence.   -will follow CMET in am  3-history of seizure -Continue Vimpat, Dilantin and Keppra -No active seizure appreciated. -continue seizure precaution   4-diabetes type 2: -Continue sliding scale insulin -will continue Holding metformin while inpatient  5-Lung Mass: -Pulmonary service has been consulted -Plan is to proceed bronchoscopy after infection is clear; but pulmonary service will like PFT's evaluation and assessed possibility of treatment base on patient performance. -outpatient follow up has been arranged   6-rhabdomyolysis -Mild and secondary to being lying down for prolonged period of time after his fall at home -Continue IV fluids -renal function WNL and hypercalcemia resolved.  7-physical deconditioning -PT ordered -recommending SNF -patient in agreement and SW on board.   Code Status: Full code Family Communication: No family a  bedside; but pulmonary service discussed and update sister over the phone. Disposition Plan: will transition antibiotics to PO and after having PT evaluation, patient will need SNF for rehabilitation.    Consultants:  Pulmonary service  Procedures:  See below for x-ray reports  Antibiotics:   Rocephin  HPI/Subjective: AAOX2, no CP, no SOB, no nausea, no vomiting.   Objective: Vitals:   05/21/17 1325 05/21/17 2213  BP: 94/61 132/81  Pulse: 76 75  Resp: 16 18  Temp: 98.4 F (36.9 C) 98.2 F (36.8 C)  SpO2: 97% 100%    Intake/Output Summary (Last 24 hours) at 05/21/17 2239 Last data filed at 05/21/17 1938  Gross per 24 hour  Intake             1685 ml  Output             1525 ml  Net              160 ml   Filed Weights   05/18/17 1506 05/20/17 0629  Weight: 71.2 kg (157 lb) 57.5 kg (126 lb 12.2 oz)    Exam:   General: no fever, no CP, no nausea, no vomiting. no seizure. AAOX2  Cardiovascular: S1 and S2, no rubs, no gallops  Respiratory: good air movement, no wheezing, no crackles  Abdomen: soft, NT, ND, positive BS  Musculoskeletal: no edema, no cyanosis, no clubbing    Data Reviewed: Basic Metabolic Panel:  Recent Labs Lab 05/18/17 1629 05/19/17 0514  NA 145 141  K 4.3 4.1  CL 102 107  CO2 25 21*  GLUCOSE 137* 117*  BUN 36* 24*  CREATININE 0.97 0.79  CALCIUM 11.2* 9.3   Liver Function Tests:  Recent Labs Lab 05/18/17 1629 05/19/17 0514  AST 162* 120*  ALT 78* 61  ALKPHOS 72 51  BILITOT 1.1 1.0  PROT 10.6* 7.2  ALBUMIN 5.3* 3.6   CBC:  Recent Labs Lab 05/18/17 1629 05/19/17 0636  WBC 7.3 7.8  NEUTROABS 6.1  --   HGB 16.2 13.4  HCT 48.5 40.4  MCV 86.6 86.0  PLT 231 154   Cardiac Enzymes:  Recent Labs Lab 05/18/17 1629  CKTOTAL 2,035*  TROPONINI 0.05*   CBG:  Recent Labs Lab 05/20/17 2108 05/21/17 0755 05/21/17 1129 05/21/17 1631 05/21/17 2216  GLUCAP 149* 102* 127* 129* 108*    Studies: No results  found.  Scheduled Meds: . atorvastatin  40 mg Oral q1800  . docusate sodium  200 mg Oral BID  . enoxaparin (LOVENOX) injection  40 mg Subcutaneous QHS  . feeding supplement (ENSURE ENLIVE)  237 mL Oral BID BM  . folic acid  1 mg Oral Daily  . galantamine  8 mg Oral BID WC  . insulin aspart  0-9 Units Subcutaneous TID WC  . insulin detemir  5 Units Subcutaneous QHS  . lacosamide  100 mg Oral BID  . levETIRAcetam  1,500 mg Oral BID  . lisinopril  5 mg Oral Daily  . multivitamin with minerals  1 tablet Oral Daily  . phenytoin  30 mg Oral BID  . thiamine  100 mg Oral Daily   Continuous Infusions: . sodium chloride 75 mL/hr at 05/21/17 1449  . cefTRIAXone (ROCEPHIN)  IV 1 g (05/21/17 2232)    Active Problems:   Altered mental status    Time spent: 25 minutes    Barton Dubois  [facility] Pager [phone]. If 7PM-7AM, please contact night-coverage at [email or web address], password [facility] 05/21/2017, 10:39 PM  LOS: 3 days

## 2017-05-22 LAB — GLUCOSE, CAPILLARY
GLUCOSE-CAPILLARY: 121 mg/dL — AB (ref 65–99)
Glucose-Capillary: 153 mg/dL — ABNORMAL HIGH (ref 65–99)
Glucose-Capillary: 102 mg/dL — ABNORMAL HIGH (ref 65–99)
Glucose-Capillary: 100 mg/dL — ABNORMAL HIGH (ref 65–99)

## 2017-05-22 MED ORDER — CEFUROXIME AXETIL 500 MG PO TABS
500.0000 mg | ORAL_TABLET | Freq: Two times a day (BID) | ORAL | Status: DC
  Administered 2017-05-22 – 2017-05-24 (×5): 500 mg via ORAL
  Filled 2017-05-22 (×5): qty 1

## 2017-05-22 NOTE — Clinical Social Work Note (Addendum)
CSW working on SNF placement for patient and Wyatt Salas contacted regarding accepting patient when ready for discharge with LOG (5-day LOG approved by [employer]. They made a bed offer and will take patient when medically stable.  [name], admissions staff at [facility] advised CSW that patient is on a medication, Vimpat and the cost is $958 for 30 pills. CSW asked to talk with MD about this medication to determine if it can be changed. Patient's nurse [name] called MD and he indicated that patient receives this med through the [place] and suggested that facility contact [place] regarding medication. This information relay to admissions and [name] will check with her boss to determine what they will do.  Visited with patient and informed him that SW is working on placement. Call made to patient's sister, Wyatt Salas ([phone]) regarding going to Wyatt Salas to complete patients's admissions paperwork. She talked directly with [name] and appointment set for Sunday at 5 pm to do paperwork.  CSW will continue to follow, work with facility regarding medication concerns and facilitate discharge to skilled facility when medically stable.  [name], MSW, LCSW Licensed Clinical Social Worker [place] [phone]

## 2017-05-22 NOTE — Progress Notes (Signed)
[facility] PROGRESS NOTE  Wyatt Salas DGL:[id number] DOB: 02-06-1951 DOA: 05/18/2017 PCP: [name], MD  Interim summary and HPI 66 y.o. male, w hx of CAD , Dm2, Seizure and alcohol abuse; who apparently slipped and fell down in the kitchen and had difficulty getting up. Patient Is not sure how long he was on the ground.  Nurses who were filling his medication found him.  Pt was brought to ED for evaluation. Found to have UTI, mild rhabdomyolysis and dehydration/hypercalcemia. Also with new discovery for lung mass.  Assessment/Plan: 1-acute encephalopathy: Metabolic/Toxic in nature and secondary to UTI and alcohol use.. -Patient's mentation improved and probably back to baseline now. -Will continue CIWA and PRN ativan -Transition antibiotics to p.o.; follow response and tolerance.   -Continue IV fluids to maintain veins open  -Hemodynamically stable and otherwise ready to be discharged once a bed available at skilled nursing facility.  2-abnormal LFTs -Possible secondary to alcohol use  -RUQ ultrasound demonstrated no acute abnormalities -Advised to stop drinking and follow complete alcohol abstinence.   -will follow CMET in am  3-history of seizure -Continue Vimpat, Dilantin and Keppra -No active seizure appreciated. -continue seizure precaution   4-diabetes type 2: -Continue sliding scale insulin -will continue Holding metformin while inpatient  5-Lung Mass: -Pulmonary service has been consulted -Plan is to proceed bronchoscopy after infection is clear; but pulmonary service will like PFT's evaluation and assessed possibility of treatment base on patient performance. -outpatient follow up has been arranged   6-rhabdomyolysis -Mild and secondary to being lying down for prolonged period of time after his fall at home -Continue IV fluids -renal function WNL and hypercalcemia resolved.  7-physical deconditioning -PT ordered -recommending SNF -patient in  agreement and SW on board.   Code Status: Full code Family Communication: No family a bedside; but pulmonary service discussed and update sister over the phone. Disposition Plan: will transition antibiotics to PO and after having PT evaluation, patient will need SNF for rehabilitation.    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  Patient is feeling weak.  Objective: Vitals:   05/22/17 0522 05/22/17 1346  BP: 95/60 98/67  Pulse: 71 72  Resp: 16 18  Temp: 98.5 F (36.9 C) 98 F (36.7 C)  SpO2: 100% 100%    Intake/Output Summary (Last 24 hours) at 05/22/17 1638 Last data filed at 05/22/17 1300  Gross per 24 hour  Intake             2195 ml  Output             2550 ml  Net             -355 ml   Filed Weights   05/18/17 1506 05/20/17 0629 05/22/17 0641  Weight: 71.2 kg (157 lb) 57.5 kg (126 lb 12.2 oz) 64.6 kg (142 lb 6.7 oz)    Exam:   General: Afebrile, no chest pain, no nausea, no vomiting.  Patient without any active seizure appreciated.  Alert, awake and oriented x3 (short memory and poor insight of ongoing day by day situation).  Cardiovascular: S1 and S2, no rubs, no gallops, no JVD.      Respiratory: Normal respiratory effort,  no wheezing, no crackles.   Abdomen: Soft, nontender, nondistended, positive bowel sounds.   Musculoskeletal: No edema, no cyanosis, no clubbing.    Data Reviewed: Basic Metabolic Panel:  Recent Labs Lab 05/18/17 1629 05/19/17 0514  NA 145 141  K 4.3 4.1  CL 102 107  CO2 25 21*  GLUCOSE 137* 117*  BUN 36* 24*  CREATININE 0.97 0.79  CALCIUM 11.2* 9.3   Liver Function Tests:  Recent Labs Lab 05/18/17 1629  05/19/17 0514  AST 162* 120*  ALT 78* 61  ALKPHOS 72 51  BILITOT 1.1 1.0  PROT 10.6* 7.2  ALBUMIN 5.3* 3.6   CBC:  Recent Labs Lab 05/18/17 1629 05/19/17 0636  WBC 7.3 7.8  NEUTROABS 6.1  --   HGB 16.2 13.4  HCT 48.5 40.4  MCV 86.6 86.0  PLT 231 154   Cardiac Enzymes:  Recent Labs Lab 05/18/17 1629  CKTOTAL 2,035*  TROPONINI 0.05*   CBG:  Recent Labs Lab 05/21/17 1129 05/21/17 1631 05/21/17 2216 05/22/17 0835 05/22/17 1152  GLUCAP 127* 129* 108* 153* 121*    Studies: No results found.  Scheduled Meds: . atorvastatin  40 mg Oral q1800  . cefUROXime  500 mg Oral BID WC  . docusate sodium  200 mg Oral BID  . enoxaparin (LOVENOX) injection  40 mg Subcutaneous QHS  . feeding supplement (ENSURE ENLIVE)  237 mL Oral BID BM  . folic acid  1 mg Oral Daily  . galantamine  8 mg Oral BID WC  . insulin aspart  0-9 Units Subcutaneous TID WC  . insulin detemir  5 Units Subcutaneous QHS  . lacosamide  100 mg Oral BID  . levETIRAcetam  1,500 mg Oral BID  . lisinopril  5 mg Oral Daily  . multivitamin with minerals  1 tablet Oral Daily  . phenytoin  30 mg Oral BID  . thiamine  100 mg Oral Daily   Continuous Infusions: . sodium chloride 75 mL/hr at 05/22/17 0454    Active Problems:   Altered mental status    Time spent: 25 minutes    Barton Dubois  [facility] Pager [phone]. If 7PM-7AM, please contact night-coverage at [email or web address], password [facility] 05/22/2017, 4:38 PM  LOS: 4 days

## 2017-05-23 ENCOUNTER — Other Ambulatory Visit: Payer: Self-pay

## 2017-05-23 DIAGNOSIS — E876 Hypokalemia: Secondary | ICD-10-CM

## 2017-05-23 LAB — URINE CULTURE

## 2017-05-23 LAB — COMPREHENSIVE METABOLIC PANEL
ALK PHOS: 47 U/L (ref 38–126)
ALT: 41 U/L (ref 17–63)
AST: 37 U/L (ref 15–41)
Albumin: 2.7 g/dL — ABNORMAL LOW (ref 3.5–5.0)
Anion gap: 6 (ref 5–15)
BUN: 12 mg/dL (ref 6–20)
CALCIUM: 8.8 mg/dL — AB (ref 8.9–10.3)
CO2: 28 mmol/L (ref 22–32)
Chloride: 105 mmol/L (ref 101–111)
Creatinine, Ser: 0.62 mg/dL (ref 0.61–1.24)
Glucose, Bld: 159 mg/dL — ABNORMAL HIGH (ref 65–99)
Potassium: 3.4 mmol/L — ABNORMAL LOW (ref 3.5–5.1)
SODIUM: 139 mmol/L (ref 135–145)
Total Bilirubin: 0.3 mg/dL (ref 0.3–1.2)
Total Protein: 5.7 g/dL — ABNORMAL LOW (ref 6.5–8.1)

## 2017-05-23 LAB — CBC
HCT: 36.2 % — ABNORMAL LOW (ref 39.0–52.0)
HEMOGLOBIN: 11.6 g/dL — AB (ref 13.0–17.0)
MCH: 28.1 pg (ref 26.0–34.0)
MCHC: 32 g/dL (ref 30.0–36.0)
MCV: 87.7 fL (ref 78.0–100.0)
Platelets: 194 10*3/uL (ref 150–400)
RBC: 4.13 MIL/uL — AB (ref 4.22–5.81)
RDW: 13.3 % (ref 11.5–15.5)
WBC: 7.2 10*3/uL (ref 4.0–10.5)

## 2017-05-23 LAB — MAGNESIUM: Magnesium: 1.9 mg/dL (ref 1.7–2.4)

## 2017-05-23 LAB — GLUCOSE, CAPILLARY
GLUCOSE-CAPILLARY: 161 mg/dL — AB (ref 65–99)
GLUCOSE-CAPILLARY: 128 mg/dL — AB (ref 65–99)
Glucose-Capillary: 88 mg/dL (ref 65–99)
Glucose-Capillary: 97 mg/dL (ref 65–99)

## 2017-05-23 MED ORDER — PANTOPRAZOLE SODIUM 40 MG PO TBEC
40.0000 mg | DELAYED_RELEASE_TABLET | Freq: Once | ORAL | Status: AC
  Administered 2017-05-23: 40 mg via ORAL
  Filled 2017-05-23: qty 1

## 2017-05-23 NOTE — Progress Notes (Signed)
CSW following to facilitate discharge planning.  11:45 AM: CSW checked in with [facility] facility representative, Dedra, to discuss patient's Vimpat medications. [facility] was hopeful that the patient's medication would be changed prior to discharge. CSW explained that it wouldn't, but that patient is able to get that medication through the [place], if the facility could accept the patient bringing his own medication from home. Dedra to check in with the facility administration, and get back to CSW with an answer on whether they can accept the patient.  3:00 PM:  Dedra contacted CSW back that they can accept the patient as long as he can bring his medication from home. Dedra indicated that they will need the sister to come to the facility to complete paperwork prior to the patient admitting. CSW contacted patient's sister to confirm that she would go to the facility to complete paperwork this evening; left voicemail.  CSW will continue to follow.  Laveda Abbe, LCSW Clinical Social Worker [phone] (Weekend coverage)

## 2017-05-23 NOTE — Progress Notes (Signed)
[facility] PROGRESS NOTE  Wyatt Salas EVO:[id number] DOB: 03-Jan-1951 DOA: 05/18/2017 PCP: [name], MD  Interim summary and HPI 66 y.o. male, w hx of CAD , Dm2, Seizure and alcohol abuse; who apparently slipped and fell down in the kitchen and had difficulty getting up. Patient Is not sure how long he was on the ground.  Nurses who were filling his medication found him.  Pt was brought to ED for evaluation. Found to have UTI, mild rhabdomyolysis and dehydration/hypercalcemia. Also with new discovery for lung mass.  Assessment/Plan: 1-acute encephalopathy: Metabolic/Toxic in nature and secondary to UTI and alcohol use.. -Patient's mentation improved and probably back to baseline now. -Will continue CIWA and PRN ativan -Continue p.o. antibiotics to complete therapy.  So far well-tolerated.     -Advised to maintain good oral hydration -Hemodynamically stable and otherwise ready to be discharged once a bed available at skilled nursing facility.  2-abnormal LFTs -Possible secondary to alcohol use  -RUQ ultrasound demonstrated no acute abnormalities -Advised to stop drinking and follow complete alcohol abstinence.   -will recommend outpatient CMET follow-up intermittently to assess LFTs trend.    3-history of seizure -Continue Vimpat, Dilantin and Keppra -No active seizure appreciated. -continue seizure precaution   4-diabetes type 2: -Continue sliding scale insulin -will continue Holding metformin while inpatient -Plan is to resume oral hypoglycemic regimen at discharge -Patient educated about importance of following a modified carbohydrates diet.  5-Lung Mass: -Pulmonary service has been consulted -Plan is to proceed bronchoscopy after infection is clear; but pulmonary service will like PFT's evaluation and assessed possibility of treatment base on patient performance. -outpatient follow up has been arranged   6-rhabdomyolysis -Mild and secondary to being lying  down for prolonged period of time after his fall at home -Continue IV fluids -renal function WNL and hypercalcemia resolved.  7-physical deconditioning -PT ordered -recommending SNF -patient in agreement and SW on board. -Medically stable for discharge to nursing facility.  8-hypokalemia: -Replete as needed and follow electrolytes trend -will check magnesium level in the morning.  Code Status: Full code Family Communication: No family a bedside; but pulmonary service discussed and update sister over the phone. Disposition Plan: will continue p.o. antibiotics and supportive care.  Will need SNF for rehabilitation.  Medically stable for discharge.  Social work assisting with placement.   Consultants:  Pulmonary service  Procedures:  See below for x-ray reports  Antibiotics:  Rocephin 10/30>>>11/3  Ceftin 11/3  HPI/Subjective: Afebrile, no chest pain, no nausea, no vomiting, patient in no distress and tolerating his antibiotics by mouth.  Objective: Vitals:   05/23/17 0632 05/23/17 1452  BP: 104/72 91/62  Pulse: 72 77  Resp: 18 18  Temp: 98.2 F (36.8 C) 98.2 F (36.8 C)  SpO2: 99% 99%    Intake/Output Summary (Last 24 hours) at 05/23/2017 1828 Last data filed at 05/23/2017 1453 Gross per 24 hour  Intake 1352 ml  Output 2400 ml  Net -1048 ml   Filed Weights   05/20/17 0629 05/22/17 0641 05/23/17 2993  Weight: 57.5 kg (126 lb 12.2 oz) 64.6 kg (142 lb 6.7 oz) 65.2 kg (143 lb 11.8 oz)    Exam:   General: No fever, no chest pain, no nausea, no vomiting.  No nausea, no vomiting.  Patient without any seizure activity.  Awake oriented x3 (even unable to remember that we have same discussion about his clinical condition yesterday).  The rest of the physical exam has remained unchanged from 05/22/17; please see  below for details:.  Cardiovascular: S1 and S2, no rubs, no gallops, no JVD.      Respiratory: Normal respiratory effort, no wheezing, no crackles.    Abdomen: Soft, nontender, nondistended, positive bowel sounds.   Musculoskeletal: No edema, no cyanosis, no clubbing.    Data Reviewed: Basic Metabolic Panel: Recent Labs  Lab 05/18/17 1629 05/19/17 0514 05/23/17 1100  NA 145 141 139  K 4.3 4.1 3.4*  CL 102 107 105  CO2 25 21* 28  GLUCOSE 137* 117* 159*  BUN 36* 24* 12  CREATININE 0.97 0.79 0.62  CALCIUM 11.2* 9.3 8.8*   Liver Function Tests: Recent Labs  Lab 05/18/17 1629 05/19/17 0514 05/23/17 1100  AST 162* 120* 37  ALT 78* 61 41  ALKPHOS 72 51 47  BILITOT 1.1 1.0 0.3  PROT 10.6* 7.2 5.7*  ALBUMIN 5.3* 3.6 2.7*   CBC: Recent Labs  Lab 05/18/17 1629 05/19/17 0636 05/23/17 1100  WBC 7.3 7.8 7.2  NEUTROABS 6.1  --   --   HGB 16.2 13.4 11.6*  HCT 48.5 40.4 36.2*  MCV 86.6 86.0 87.7  PLT 231 154 194   Cardiac Enzymes: Recent Labs  Lab 05/18/17 1629  CKTOTAL 2,035*  TROPONINI 0.05*   CBG: Recent Labs  Lab 05/22/17 1655 05/22/17 2129 05/23/17 0752 05/23/17 1130 05/23/17 1715  GLUCAP 102* 100* 88 161* 97    Studies: No results found.  Scheduled Meds: . atorvastatin  40 mg Oral q1800  . cefUROXime  500 mg Oral BID WC  . docusate sodium  200 mg Oral BID  . enoxaparin (LOVENOX) injection  40 mg Subcutaneous QHS  . feeding supplement (ENSURE ENLIVE)  237 mL Oral BID BM  . folic acid  1 mg Oral Daily  . galantamine  8 mg Oral BID WC  . insulin aspart  0-9 Units Subcutaneous TID WC  . insulin detemir  5 Units Subcutaneous QHS  . lacosamide  100 mg Oral BID  . levETIRAcetam  1,500 mg Oral BID  . lisinopril  5 mg Oral Daily  . multivitamin with minerals  1 tablet Oral Daily  . phenytoin  30 mg Oral BID  . thiamine  100 mg Oral Daily   Continuous Infusions:   Active Problems:   Altered mental status    Time spent: 25 minutes    Barton Dubois  [facility] Pager [phone]. If 7PM-7AM, please contact night-coverage at [email or web address], password [facility] 05/23/2017, 6:28 PM  LOS: 5  days

## 2017-05-24 DIAGNOSIS — R918 Other nonspecific abnormal finding of lung field: Secondary | ICD-10-CM

## 2017-05-24 DIAGNOSIS — R945 Abnormal results of liver function studies: Secondary | ICD-10-CM

## 2017-05-24 DIAGNOSIS — I10 Essential (primary) hypertension: Secondary | ICD-10-CM

## 2017-05-24 LAB — BASIC METABOLIC PANEL
Anion gap: 5 (ref 5–15)
BUN: 11 mg/dL (ref 6–20)
CO2: 27 mmol/L (ref 22–32)
CREATININE: 0.63 mg/dL (ref 0.61–1.24)
Calcium: 8.7 mg/dL — ABNORMAL LOW (ref 8.9–10.3)
Chloride: 107 mmol/L (ref 101–111)
GFR calc non Af Amer: >60 mL/min (ref >60)
Glucose, Bld: 82 mg/dL (ref 65–99)
Potassium: 4.4 mmol/L (ref 3.5–5.1)
SODIUM: 139 mmol/L (ref 135–145)

## 2017-05-24 LAB — GLUCOSE, CAPILLARY
GLUCOSE-CAPILLARY: 103 mg/dL — AB (ref 65–99)
GLUCOSE-CAPILLARY: 176 mg/dL — AB (ref 65–99)
Glucose-Capillary: 127 mg/dL — ABNORMAL HIGH (ref 65–99)

## 2017-05-24 MED ORDER — FOLIC ACID 1 MG PO TABS: 1.0000 mg | ORAL_TABLET | Freq: Every day | ORAL | Status: AC

## 2017-05-24 MED ORDER — CEFUROXIME AXETIL 500 MG PO TABS: 500.0000 mg | ORAL_TABLET | Freq: Two times a day (BID) | ORAL | Status: AC

## 2017-05-24 MED ORDER — THIAMINE HCL 100 MG PO TABS: 100.0000 mg | ORAL_TABLET | Freq: Every day | ORAL | Status: AC

## 2017-05-24 NOTE — Progress Notes (Signed)
CSW was contacted by [employer]- pt given authorization for SNF- [phone], next review date 05/26/17. 542min/week, rug level RVC. CSW spoke with [facility]- facility prepared for pt to admit today- pt's sister completed admission paperwork. Will facilitate transfer at DC.  Sharren Bridge, MSW, LCSW Clinical Social Work 05/24/2017 [phone]

## 2017-05-24 NOTE — Discharge Summary (Signed)
Physician Discharge Summary  Wyatt Salas JKD:[id number] DOB: 1951/03/09 DOA: 05/18/2017  PCP: [name], MD  Admit date: 05/18/2017 Discharge date: 05/24/2017  Time spent: 35 minutes  Recommendations for Outpatient Follow-up:  Repeat CMET to follow electrolytes and LFT's Patient to follow up with pulmonary service as indicated for further evaluation of lung mass.  Discharge Diagnoses:  Active Problems:   Acute urinary tract infection   Altered mental status   Abnormal liver function   Lung mass   Essential hypertension alcohol abuse Hypokalemia hypomagnesemia  Discharge Condition: stable and improved. Discharge to SNF for further care and rehabilitation.   Diet recommendation: heart healthy diet   Filed Weights   05/20/17 0629 05/22/17 0641 05/23/17 0998  Weight: 57.5 kg (126 lb 12.2 oz) 64.6 kg (142 lb 6.7 oz) 65.2 kg (143 lb 11.8 oz)    History of present illness:  66 y.o.male,w hx of CAD , Dm2, Seizure and alcohol abuse; who apparently slipped and fell down in the kitchen and had difficulty getting up. Patient Is not sure how long he was on the ground. Nurses who were filling his medication found him. Pt was brought to ED for evaluation. Found to have UTI, mild rhabdomyolysis and dehydration/hypercalcemia. Also with new discovery for lung mass.  Hospital Course:  1-acute encephalopathy: Metabolic/Toxic in nature and secondary to staph lugdunensis UTI and alcohol use.. -Patient's mentation improved and probably back to baseline now. -no withdrawal symptoms  -Continue p.o. antibiotics to complete therapy.  So far well-tolerated.   -5 days of antibiotics after discharge   -Advised to maintain good oral hydration -Hemodynamically stable and ready to be discharged to skilled nursing facility for rehab.  2-abnormal LFTs -Possible secondary to alcohol use  -RUQ ultrasound demonstrated no acute abnormalities -Advised to stop drinking and follow complete  alcohol abstinence.   -will recommend outpatient CMET to follow-up LFTs trend.    3-history of seizure -Continue Vimpat, Dilantin and Keppra -No active seizure appreciated. -continue seizure precaution.  4-diabetes type 2: -Plan is to resume oral hypoglycemic regimen at discharge -Patient educated about importance of following a modified carbohydrates diet.  5-Lung Mass: -Pulmonary service has been consulted -Plan is to proceed bronchoscopy after infection is clear; but pulmonary service will like PFT's evaluation and assessed possibility of treatment base on patient performance. -outpatient follow up has been arranged   6-rhabdomyolysis -Mild and secondary to being lying down for prolonged period of time after his fall at home -resolved with IVF's -renal function WNL and hypercalcemia resolved.  7-physical deconditioning -PT ordered -recommending SNF -patient in agreement and Medically stable for discharge to nursing facility for rehabilitaiton.  8-hypokalemia/hypomagnesemia: -Repleted -magnesium also repleted and WNL at discharge.  9-HTN -will continue lisinopril  Procedures: See below for x-ray reports   Consultations:  PCCM  Discharge Exam: Vitals:   05/24/17 1120 05/24/17 1437  BP: 116/73 112/70  Pulse: 79 66  Resp:    Temp:  98.5 F (36.9 C)  SpO2: 100% 100%     General: No fever, no chest pain, no nausea, no vomiting.  No nausea, no vomiting.  Patient without any seizure activity.  Awake oriented x3 (even unable to remember that we have same discussion about his clinical condition).   Cardiovascular: S1 and S2, no rubs, no gallops, no JVD.      Respiratory: Normal respiratory effort, no wheezing, no crackles.   Abdomen: Soft, nontender, nondistended, positive bowel sounds.   Musculoskeletal: No edema, no cyanosis, no clubbing.  Discharge Instructions   Discharge Instructions    Diet - low sodium heart healthy   Complete by:  As  directed    Discharge instructions   Complete by:  As directed    Take medications as prescribed  Maintain adequate hydration Follow up with pulmonary service for further evaluation and treatment of lung mass found during this admission  Stop alcohol intake  Physical rehabilitation as per SNF protocol     Current Discharge Medication List    START taking these medications   Details  cefUROXime (CEFTIN) 500 MG tablet Take 1 tablet (500 mg total) 2 (two) times daily with a meal for 5 days by mouth.    folic acid (FOLVITE) 1 MG tablet Take 1 tablet (1 mg total) daily by mouth.    thiamine 100 MG tablet Take 1 tablet (100 mg total) daily by mouth.      CONTINUE these medications which have NOT CHANGED   Details  acetaminophen (TYLENOL) 500 MG tablet Take 1,000 mg by mouth every 12 (twelve) hours as needed for mild pain or fever.    carboxymethylcellulose (REFRESH PLUS) 0.5 % SOLN Place 1 drop into both eyes 2 (two) times daily as needed (Dry eyes).    docusate sodium (COLACE) 100 MG capsule Take 200 mg by mouth 2 (two) times daily.    ENSURE PLUS (ENSURE PLUS) LIQD Take 237 mLs by mouth 2 (two) times daily between meals.    galantamine (RAZADYNE) 8 MG tablet Take 1 tablet (8 mg total) by mouth 2 (two) times daily with a meal. Qty: 60 tablet, Refills: 1    lacosamide (VIMPAT) 50 MG TABS tablet Take 100 mg by mouth 2 (two) times daily.    levETIRAcetam (KEPPRA) 750 MG tablet Take 1,500 mg by mouth 2 (two) times daily.    lisinopril (PRINIVIL,ZESTRIL) 5 MG tablet Take 5 mg by mouth daily.    metFORMIN (GLUCOPHAGE) 500 MG tablet Take 500 mg by mouth daily with breakfast.     Multiple Vitamin (MULTIVITAMIN WITH MINERALS) TABS tablet Take 1 tablet by mouth daily.    phenytoin (DILANTIN) 30 MG ER capsule Take 1 capsule (30 mg total) by mouth 2 (two) times daily. Qty: 14 capsule, Refills: 0    simvastatin (ZOCOR) 80 MG tablet Take 40 mg by mouth at bedtime.       STOP taking  these medications     carbamide peroxide (DEBROX) 6.5 % OTIC solution      ibuprofen (ADVIL,MOTRIN) 200 MG tablet        Allergies  Allergen Reactions  . Shellfish Allergy Rash    Contact information for follow-up providers    Parrett, Fonnie Mu, NP Follow up on 05/27/2017.   Specialty:  Pulmonary Disease Why:  To discuss his lung mass and biopsy; 9 AM, needs to have HCPOA present for this conversation Contact information: [address]. [place] [address] [phone]        [name], MD. Schedule an appointment as soon as possible for a visit in 10 day(s).   Specialty:  Internal Medicine Why:  after discharge from SNF Contact information: [place] [address] [phone]            Contact information for after-discharge care    Destination    [facility] Follow up.   Service:  Skilled Chiropodist information: [place] [phone]  The results of significant diagnostics from this hospitalization (including imaging, microbiology, ancillary and laboratory) are listed below for reference.    Significant Diagnostic Studies: Dg Chest 2 View  Result Date: 05/18/2017 CLINICAL DATA:  Patient found down. Altered mental status. Hallucinations. Confusion. EXAM: CHEST  2 VIEW COMPARISON:  10/14/2016. FINDINGS: The heart size and mediastinal contours are within normal limits. The lungs are free of infiltrates or edema. In the posterior segment RIGHT upper lobe, there is a suspected 2.7 x 3.5 cm mass. This is new/ increased from priors. There is some overlap from telemetry leads. CT chest with contrast recommended. The visualized skeletal structures are unremarkable. Prominent colonic distention under the RIGHT and LEFT hemidiaphragms simulates pneumoperitoneum. Healed RIGHT-sided rib fractures. IMPRESSION: Query 2.7 x 3.5 cm RIGHT upper lobe mass. CT  chest with contrast recommended. Considerable colonic distention under the RIGHT and LEFT hemidiaphragms, simulating pneumoperitoneum. Bowel obstruction cannot be excluded on this chest radiograph. Consider flat and erect abdominal films. Electronically Signed   By: Staci Righter M.D.   On: 05/18/2017 16:14   Dg Tibia/fibula Left  Result Date: 05/18/2017 CLINICAL DATA:  Patient found down today. Left lower leg pain with movement. Initial encounter. EXAM: LEFT TIBIA AND FIBULA - 2 VIEW COMPARISON:  Plain films left ankle 06/24/2016. FINDINGS: The patient has healed diaphyseal fractures of the tibia and fibula. The fractures are partially seen on the prior examination. Avulsion fracture of the dorsal calcaneus seen on the prior study has been repaired with a single screw in place. No acute bony abnormality is identified. IMPRESSION: No acute finding. Healed tibial, fibular and calcaneal fractures. Electronically Signed   By: Inge Rise M.D.   On: 05/18/2017 16:11   Ct Head Wo Contrast  Result Date: 05/18/2017 CLINICAL DATA:  EMS reports Home health care nurse called due to being found altered and on kitchen floor unknown LOC unknown down time. Pt experiencing hallucinations. Pt denies pain and has no complaints. Hx DM EXAM: CT HEAD WITHOUT CONTRAST CT CERVICAL SPINE WITHOUT CONTRAST TECHNIQUE: Multidetector CT imaging of the head and cervical spine was performed following the standard protocol without intravenous contrast. Multiplanar CT image reconstructions of the cervical spine were also generated. COMPARISON:  Head CT 03/11/2017, C-spine 05/31/2009 FINDINGS: CT HEAD FINDINGS Brain: No intracranial hemorrhage. No parenchymal contusion. No midline shift or mass effect. Basilar cisterns are patent. No skull base fracture. No fluid in the paranasal sinuses or mastoid air cells. Orbits are normal. Encephalomalacia within the RIGHT temporal lobe unchanged. Prior RIGHT craniotomy noted. Vascular: No  hyperdense vessel or unexpected calcification. Skull: Normal. Negative for fracture or focal lesion. Sinuses/Orbits: Paranasal sinuses and mastoid air cells are clear. Orbits are clear. Other: None. CT CERVICAL SPINE FINDINGS Alignment:  Focal kyphosis in the mid cervical spine. Skull base and vertebrae: Normal craniocervical junction. No loss of vertebral body height or disc height. Normal facet articulation. No evidence of fracture. Soft tissues and spinal canal: No prevertebral soft tissue swelling. No perispinal or epidural hematoma. Disc levels: Multiple levels of endplate spurring and joint space narrowing. No acute subluxation. Mild anterolisthesis of C3 on C4 is favored degenerative. Upper chest: Clear Other: None IMPRESSION: 1. No acute intracranial trauma. 2. Postprocedural encephalomalacia in the RIGHT temporal lobe unchanged. 3. No cervical spine fracture. 4. Multilevel disc osteophytic disease. 5. Focal kyphosis in the mid cervical spine similar to comparison exam. Electronically Signed   By: Suzy Bouchard M.D.   On: 05/18/2017 16:27   Ct Angio Chest Pe  W/cm &/or Wo Cm  Result Date: 05/18/2017 CLINICAL DATA:  Chest pain. EXAM: CT ANGIOGRAPHY CHEST WITH CONTRAST TECHNIQUE: Multidetector CT imaging of the chest was performed using the standard protocol during bolus administration of intravenous contrast. Multiplanar CT image reconstructions and MIPs were obtained to evaluate the vascular anatomy. CONTRAST:  100 cc Isovue 370 intravenously. COMPARISON:  Chest radiograph 05/18/2017 FINDINGS: Cardiovascular: Normal heart size. No pericardial effusion. Calcific atherosclerotic disease of the coronary arteries. No evidence of pulmonary embolus. Mediastinum/Nodes: Right hilar lymphadenopathy measuring collectively 20 mm. Milder right peritracheal lymphadenopathy. Enlarged peribronchial lymph node versus a satellite nodule in the right upper lobe. Trachea and esophagus are normal. Lungs/Pleura:  Spiculated soft tissue mass in the posterior right upper lobe measures 2.8 x 3.5 x 3.6 cm. Prominent spicules extend to the posterior pleural surface. Hazy ground-glass opacities surround this solid soft tissue mass. The previously mentioned likely malignant right hilar lymphadenopathy narrows slightly the right mainstem bronchus. Nodular and ground-glass opacities are seen in the right lower lobe, particularly in its superior segment. Example, linear approximately 1 cm in AP dimension ground-glass opacity, image 53/106, series 11. Area of patchy airspace consolidation versus a satellite nodule is seen in the lateral segment of the right middle lobe. Upper Abdomen: No acute abnormality. Musculoskeletal: No chest wall abnormality. No acute or significant osseous findings. Review of the MIP images confirms the above findings. IMPRESSION: No evidence of pulmonary embolus. Spiculated 3.6 cm right upper lobe soft tissue mass, highly suspicious for primary lung malignancy, with associated right hilar and paratracheal lymphadenopathy. 9 mm soft tissue nodule along the posterior right upper lobe segmental bronchus may represent a satellite nodule versus a peribronchial lymph node. Hazy ground-glass opacities surround the soft tissue mass and may represent direct extension of disease versus surrounding inflammatory changes. Nodular and ground-glass opacities throughout the right lower lobe and right middle lobe may also represent distal spread of disease. These results were called by telephone at the time of interpretation on 05/18/2017 at 8:39 pm to Dr. Sherwood Gambler , who verbally acknowledged these results. Electronically Signed   By: Fidela Salisbury M.D.   On: 05/18/2017 20:42   Ct Cervical Spine Wo Contrast  Result Date: 05/18/2017 CLINICAL DATA:  EMS reports Home health care nurse called due to being found altered and on kitchen floor unknown LOC unknown down time. Pt experiencing hallucinations. Pt denies  pain and has no complaints. Hx DM EXAM: CT HEAD WITHOUT CONTRAST CT CERVICAL SPINE WITHOUT CONTRAST TECHNIQUE: Multidetector CT imaging of the head and cervical spine was performed following the standard protocol without intravenous contrast. Multiplanar CT image reconstructions of the cervical spine were also generated. COMPARISON:  Head CT 03/11/2017, C-spine 05/31/2009 FINDINGS: CT HEAD FINDINGS Brain: No intracranial hemorrhage. No parenchymal contusion. No midline shift or mass effect. Basilar cisterns are patent. No skull base fracture. No fluid in the paranasal sinuses or mastoid air cells. Orbits are normal. Encephalomalacia within the RIGHT temporal lobe unchanged. Prior RIGHT craniotomy noted. Vascular: No hyperdense vessel or unexpected calcification. Skull: Normal. Negative for fracture or focal lesion. Sinuses/Orbits: Paranasal sinuses and mastoid air cells are clear. Orbits are clear. Other: None. CT CERVICAL SPINE FINDINGS Alignment:  Focal kyphosis in the mid cervical spine. Skull base and vertebrae: Normal craniocervical junction. No loss of vertebral body height or disc height. Normal facet articulation. No evidence of fracture. Soft tissues and spinal canal: No prevertebral soft tissue swelling. No perispinal or epidural hematoma. Disc levels: Multiple levels of endplate spurring and  joint space narrowing. No acute subluxation. Mild anterolisthesis of C3 on C4 is favored degenerative. Upper chest: Clear Other: None IMPRESSION: 1. No acute intracranial trauma. 2. Postprocedural encephalomalacia in the RIGHT temporal lobe unchanged. 3. No cervical spine fracture. 4. Multilevel disc osteophytic disease. 5. Focal kyphosis in the mid cervical spine similar to comparison exam. Electronically Signed   By: Suzy Bouchard M.D.   On: 05/18/2017 16:27   Dg Foot Complete Left  Result Date: 05/18/2017 CLINICAL DATA:  Patient found down today. Left foot pain with movement. Initial encounter. EXAM: LEFT  FOOT - COMPLETE 3+ VIEW COMPARISON:  Plain films left ankle 06/24/2006. FINDINGS: No acute bony or joint abnormality is identified. The patient is status post repair of an avulsion fracture of the calcaneus seen on the prior examination. Bones are osteopenic. IMPRESSION: No acute abnormality. Status post repair of a remote avulsion fracture of the calcaneus. Osteopenia. Electronically Signed   By: Inge Rise M.D.   On: 05/18/2017 16:17   US Abdomen Limited Ruq  Result Date: 05/19/2017 CLINICAL DATA:  Elevated LFTs EXAM: ULTRASOUND ABDOMEN LIMITED RIGHT UPPER QUADRANT COMPARISON:  None. FINDINGS: Gallbladder: No gallstones or wall thickening visualized. No sonographic Murphy sign noted by sonographer. Common bile duct: Diameter: 3.6 mm Liver: No focal lesion identified. Within normal limits in parenchymal echogenicity. Portal vein is patent on color Doppler imaging with normal direction of blood flow towards the liver. Cystic lesion is noted within the right kidney, incompletely evaluated on this exam. IMPRESSION: No acute abnormality is noted in the right upper quadrant. Cystic lesion within the right kidney incompletely evaluated on this exam. Electronically Signed   By: Inez Catalina M.D.   On: 05/19/2017 11:43    Microbiology: Recent Results (from the past 240 hour(s))  Urine culture     Status: Abnormal   Collection Time: 05/18/17  8:35 PM  Result Value Ref Range Status   Specimen Description URINE, CLEAN CATCH  Final   Special Requests NONE  Final   Culture >=100,000 COLONIES/mL STAPHYLOCOCCUS LUGDUNENSIS (A)  Final   Report Status 05/23/2017 FINAL  Final   Organism ID, Bacteria STAPHYLOCOCCUS LUGDUNENSIS (A)  Final      Susceptibility   Staphylococcus lugdunensis - MIC*    CIPROFLOXACIN Value in next row Sensitive      SENSITIVE<=0.5    GENTAMICIN Value in next row Sensitive      SENSITIVE<=0.5    NITROFURANTOIN Value in next row Sensitive      SENSITIVE<=16    TETRACYCLINE Value  in next row Sensitive      SENSITIVE<=1    VANCOMYCIN Value in next row Sensitive      SENSITIVE<=0.5    TRIMETH/SULFA Value in next row Sensitive      SENSITIVE<=10    CLINDAMYCIN Value in next row Sensitive      SENSITIVE<=0.25    RIFAMPIN Value in next row Sensitive      SENSITIVE<=0.5    Inducible Clindamycin Value in next row Sensitive      SENSITIVE<=0.5    * >=100,000 COLONIES/mL STAPHYLOCOCCUS LUGDUNENSIS     Labs: Basic Metabolic Panel: Recent Labs  Lab 05/18/17 1629 05/19/17 0514 05/23/17 1100 05/23/17 1938 05/24/17 0453  NA 145 141 139  --  139  K 4.3 4.1 3.4*  --  4.4  CL 102 107 105  --  107  CO2 25 21* 28  --  27  GLUCOSE 137* 117* 159*  --  82  BUN 36* 24* 12  --  11  CREATININE 0.97 0.79 0.62  --  0.63  CALCIUM 11.2* 9.3 8.8*  --  8.7*  MG  --   --   --  1.9  --    Liver Function Tests: Recent Labs  Lab 05/18/17 1629 05/19/17 0514 05/23/17 1100  AST 162* 120* 37  ALT 78* 61 41  ALKPHOS 72 51 47  BILITOT 1.1 1.0 0.3  PROT 10.6* 7.2 5.7*  ALBUMIN 5.3* 3.6 2.7*   CBC: Recent Labs  Lab 05/18/17 1629 05/19/17 0636 05/23/17 1100  WBC 7.3 7.8 7.2  NEUTROABS 6.1  --   --   HGB 16.2 13.4 11.6*  HCT 48.5 40.4 36.2*  MCV 86.6 86.0 87.7  PLT 231 154 194   Cardiac Enzymes: Recent Labs  Lab 05/18/17 1629  CKTOTAL 2,035*  TROPONINI 0.05*    CBG: Recent Labs  Lab 05/23/17 1130 05/23/17 1715 05/23/17 2226 05/24/17 0745 05/24/17 1143  GLUCAP 161* 97 128* 103* 176*    Signed:  Barton Dubois MD.  [facility] 05/24/2017, 3:52 PM

## 2017-05-24 NOTE — Clinical Social Work Placement (Signed)
  Pt discharged today- will admit to [facility] for ST rehab. Report [phone] Pt will transport via [facility]. CSW completed medical necessity form and will arrange transport. All information provided to facility via the [facility]. Pt's sister aware of plan and agreeable.  See below for placement details.  CLINICAL SOCIAL WORK PLACEMENT  NOTE  Date:  05/24/2017  Patient Details  Name: Wyatt Salas MRN: [id number] Date of Birth: 1951-01-24  Clinical Social Work is seeking post-discharge placement for this patient at the [place] level of care (*CSW will initial, date and re-position this form in  chart as items are completed):  Yes   Patient/family provided with [facility] Work Department's list of facilities offering this level of care within the geographic area requested by the patient (or if unable, by the patient's family).  Yes   Patient/family informed of their freedom to choose among providers that offer the needed level of care, that participate in Medicare, Medicaid or managed care program needed by the patient, have an available bed and are willing to accept the patient.  Yes   Patient/family informed of [facility]'s ownership interest in [facility] and [facility], as well as of the fact that they are under no obligation to receive care at these facilities.  PASRR submitted to EDS on       PASRR number received on       Existing PASRR number confirmed on 05/21/17     FL2 transmitted to all facilities in geographic area requested by pt/family on 05/21/17     FL2 transmitted to all facilities within larger geographic area on       Patient informed that his/her managed care company has contracts with or will negotiate with certain facilities, including the following:        Yes   Patient/family informed of bed offers received.  Patient chooses bed at [facility]     Physician recommends and patient chooses  bed at [facility]    Patient to be transferred to [facility] on 05/24/17.  Patient to be transferred to facility by [facility]     Patient family notified on 05/24/17 of transfer.  Name of family member notified:  sister Wyatt Salas     PHYSICIAN       Additional Comment:    _______________________________________________ Nila Nephew, LCSW 05/24/2017, 4:27 PM  [phone]

## 2017-05-24 NOTE — Progress Notes (Signed)
Physical Therapy Treatment Patient Details Name: Wyatt Salas MRN: [id number] DOB: 1951/04/21 Today's Date: 05/24/2017    History of Present Illness 66 y.o. male with hx of CAD , Dm2, Seizure, SDH with R craniotomy,  and alcohol abuse; who apparently slipped and fell down in the kitchen and had difficulty getting up and admitted with acute encephalopathy    PT Comments    Required min assist with bed mobility to lift trunk into upright posture, 25% VC's needed to push with elbow. Required min guard with sit to stand. Ambulated 4 feet with RW and c/o dizziness that diminished with rest. Ambulated 50 feet with min guard with a step to pattern and a cervical flexion posture. 50% VC's needed to raise head while ambulating however this increased his c/o dizziness. Positioned in recliner.   Follow Up Recommendations  SNF;Supervision/Assistance - 24 hour     Equipment Recommendations  None recommended by PT    Recommendations for Other Services       Precautions / Restrictions Precautions Precautions: Fall Restrictions Weight Bearing Restrictions: No    Mobility  Bed Mobility Overal bed mobility: Needs Assistance Bed Mobility: Supine to Sit Rolling: Supervision;Min guard   Supine to sit: Min assist     General bed mobility comments: min assist needed to position upright  Transfers Overall transfer level: Needs assistance Equipment used: Rolling walker (2 wheeled) Transfers: Sit to/from Stand Sit to Stand: Min guard;Min assist         General transfer comment: required min guard with sit to stand for safety, min assist with 25% VC's for hand placement   Ambulation/Gait Ambulation/Gait assistance: Min guard Ambulation Distance (Feet): 60 Feet Assistive device: Rolling walker (2 wheeled) Gait Pattern/deviations: Step-to pattern;Shuffle(Cervical flexed)   Gait velocity interpretation: Below normal speed for age/gender General Gait Details: ambulated 4 feet c/o  dizziness that diminished with rest, cervical flexion posture   Stairs            Wheelchair Mobility    Modified Rankin (Stroke Patients Only)       Balance                                            Cognition Arousal/Alertness: Awake/alert Behavior During Therapy: WFL for tasks assessed/performed Overall Cognitive Status: Within Functional Limits for tasks assessed                                        Exercises      General Comments        Pertinent Vitals/Pain Pain Assessment: No/denies pain    Home Living                      Prior Function            PT Goals (current goals can now be found in the care plan section)      Frequency    Min 3X/week      PT Plan      Co-evaluation              AM-PAC PT "6 Clicks" Daily Activity  Outcome Measure  Difficulty turning over in bed (including adjusting bedclothes, sheets and blankets)?: None Difficulty moving from lying on back to sitting on the side  of the bed? : A Little Difficulty sitting down on and standing up from a chair with arms (e.g., wheelchair, bedside commode, etc,.)?: A Little Help needed moving to and from a bed to chair (including a wheelchair)?: A Lot Help needed walking in hospital room?: A Little Help needed climbing 3-5 steps with a railing? : A Lot 6 Click Score: 17    End of Session Equipment Utilized During Treatment: Gait belt Activity Tolerance: Patient tolerated treatment well Patient left: in chair;with chair alarm set;with call bell/phone within reach Nurse Communication: Mobility status PT Visit Diagnosis: Difficulty in walking, not elsewhere classified (R26.2)     Time: 1452-1510 PT Time Calculation (min) (ACUTE ONLY): 18 min  Charges:  $Gait Training: 8-22 mins                    G Codes:       Almond Lint, SPTA [facility] [place]  PTA [facility]  Acute  Rehab Pager       [phone]

## 2017-05-27 ENCOUNTER — Encounter: Payer: Self-pay | Admitting: Adult Health

## 2017-05-27 ENCOUNTER — Ambulatory Visit (INDEPENDENT_AMBULATORY_CARE_PROVIDER_SITE_OTHER): Payer: Medicare HMO | Admitting: Adult Health

## 2017-05-27 DIAGNOSIS — R918 Other nonspecific abnormal finding of lung field: Secondary | ICD-10-CM | POA: Diagnosis not present

## 2017-05-27 DIAGNOSIS — R911 Solitary pulmonary nodule: Secondary | ICD-10-CM

## 2017-05-27 NOTE — Addendum Note (Signed)
Addended by: Parke Poisson E on: 05/27/2017 10:13 AM   Modules accepted: Orders

## 2017-05-27 NOTE — Assessment & Plan Note (Addendum)
Lung mass with associated adenopathy suspcious for lung cancer  Set up for PET scan  follow up with Dr. Lamonte Sakai  To disucss results and next step .    Plan  Patient Instructions  Set up PET scan .  Follow up with Dr. Lamonte Sakai 1 week with PFT  To discuss results and next step .  Please contact office for sooner follow up if symptoms do not improve or worsen or seek emergency care

## 2017-05-27 NOTE — Patient Instructions (Addendum)
Set up PET scan .  Follow up with Dr. Lamonte Sakai 1 week with PFT  To discuss results and next step .  Please contact office for sooner follow up if symptoms do not improve or worsen or seek emergency care  When she is

## 2017-05-27 NOTE — Progress Notes (Signed)
@Patient  ID: Wyatt Salas, male    DOB: 14-Dec-1950, 66 y.o.   MRN: [id number]  Chief Complaint  Patient presents with  . Follow-up    Lung mass     Referring provider: [name], MD  HPI: 66 year old male with a history of alcohol abuse former smoker seen for pulmonary consult during hospitalization October 2018.  Patient was admitted after a traumatic fall.  During his workup a CT scan of the chest identified a 3.6 right upper lobe spiculated mass with associated right hilar and paratracheal lymphadenopathy.  TEST  CT chest 10/30: No evidence of pulmonary embolus.Spiculated 3.6 cm right upper lobe soft tissue mass, highly suspicious for primary lung malignancy, with associated right hilar and paratracheal lymphadenopathy. 9 mm soft tissue nodule along the posterior right upper lobe segmental bronchus may represent a satellite nodule versus a peribronchial lymph node.Hazy ground-glass opacities surround the soft tissue mass and may represent direct extension of disease versus surrounding inflammatory changes.Nodular and ground-glass opacities throughout the right lower lobe and right middle lobe may also represent distal spread of disease.  05/27/2017 Follow up: Lung Mass Patient presents for a post hospital follow-up.  Patient was admitted last month after a traumatic fall with head trauma.  During patient's workup a CT chest was done that showed a spiculated 3.6 right upper lobe lung mass highly suspicious for primary lung malignancy.  There was associated right hilar and paratracheal lymphadenopathy.  9 mm soft tissue nodule along the posterior right upper lobe segmental bronchus and may represent direct extension of disease versus surrounding inflammatory changes. Patient denies any chest pain orthopnea PND or leg swelling.  No hemoptysis.  Weight has been trending down over the last 2 years.  Pt is in rehab currently . Prior to admission he lived in an apartment by himself . He  has left sided weakness from [facility] spotted fever in his 50s. Walks with cane and has power wheelchair. [facility] [place]. Former smoker , quit 1 year ago . Smoked cigs, cigars, THC . Drinks beers when he has them.      Allergies  Allergen Reactions  . Shellfish Allergy Rash    Immunization History  Administered Date(s) Administered  . Influenza, High Dose Seasonal PF 04/19/2017  . Influenza,inj,Quad PF,6+ Mos 07/28/2013  . Pneumococcal Polysaccharide-23 07/28/2013    Past Medical History:  Diagnosis Date  . Diabetes mellitus without complication ([place])   . Diastolic dysfunction    a. ECHO 07/2013 EF 55-60% and grade 1 diastolic dysfunction  . NSTEMI (non-ST elevated myocardial infarction) ([place])    problem list 01/2014  . Rhabdomyolysis 01/2014  . Seizures ([place])   . Syncope 01/2014  . Traumatic brain injury ([place])     Tobacco History: Social History   Tobacco Use  Smoking Status Former Smoker  . Types: Cigars  Smokeless Tobacco Never Used   Counseling given: Not Answered   Outpatient Encounter Medications as of 05/27/2017  Medication Sig  . acetaminophen (TYLENOL) 500 MG tablet Take 1,000 mg by mouth every 12 (twelve) hours as needed for mild pain or fever.  . carboxymethylcellulose (REFRESH PLUS) 0.5 % SOLN Place 1 drop into both eyes 2 (two) times daily as needed (Dry eyes).  . cefUROXime (CEFTIN) 500 MG tablet Take 1 tablet (500 mg total) 2 (two) times daily with a meal for 5 days by mouth.  . docusate sodium (COLACE) 100 MG capsule Take 200 mg by mouth 2 (two) times daily.  Marland Kitchen  ENSURE PLUS (ENSURE PLUS) LIQD Take 237 mLs by mouth 2 (two) times daily between meals.  . folic acid (FOLVITE) 1 MG tablet Take 1 tablet (1 mg total) daily by mouth.  . galantamine (RAZADYNE) 8 MG tablet Take 1 tablet (8 mg total) by mouth 2 (two) times daily with a meal.  . lacosamide (VIMPAT) 50 MG TABS tablet Take 100 mg by mouth 2 (two) times daily.  Marland Kitchen levETIRAcetam  (KEPPRA) 750 MG tablet Take 1,500 mg by mouth 2 (two) times daily.  Marland Kitchen lisinopril (PRINIVIL,ZESTRIL) 5 MG tablet Take 5 mg by mouth daily.  . metFORMIN (GLUCOPHAGE) 500 MG tablet Take 500 mg by mouth daily with breakfast.   . Multiple Vitamin (MULTIVITAMIN WITH MINERALS) TABS tablet Take 1 tablet by mouth daily.  . phenytoin (DILANTIN) 30 MG ER capsule Take 1 capsule (30 mg total) by mouth 2 (two) times daily. (Patient taking differently: Take 30 mg daily by mouth. )  . simvastatin (ZOCOR) 80 MG tablet Take 80 mg at bedtime by mouth.   . thiamine 100 MG tablet Take 1 tablet (100 mg total) daily by mouth.   No facility-administered encounter medications on file as of 05/27/2017.      Review of Systems  Constitutional:   No  weight loss, night sweats,  Fevers, chills,  + fatigue, or  lassitude.  HEENT:   No headaches,  Difficulty swallowing,  Tooth/dental problems, or  Sore throat,                No sneezing, itching, ear ache, nasal congestion, post nasal drip,   CV:  No chest pain,  Orthopnea, PND, swelling in lower extremities, anasarca, dizziness, palpitations, syncope.   GI  No heartburn, indigestion, abdominal pain, nausea, vomiting, diarrhea, change in bowel habits, loss of appetite, bloody stools.   Resp:    No chest wall deformity  Skin: no rash or lesions.  GU: no dysuria, change in color of urine, no urgency or frequency.  No flank pain, no hematuria   MS:  No joint pain or swelling.  No decreased range of motion.  No back pain.    Physical Exam  BP 104/62 (BP Location: Right Arm, Cuff Size: Normal)   Pulse 75   Wt 140 lb 6.4 oz (63.7 kg)   SpO2 100%   BMI 18.52 kg/m   GEN: A/Ox3; pleasant , NAD, Thin and Frail in wc    HEENT:  [place]/AT,  EACs-clear, TMs-wnl, NOSE-clear, THROAT-clear, no lesions, no postnasal drip or exudate noted. Very poor dentition .   NECK:  Supple w/ fair ROM; no JVD; normal carotid impulses w/o bruits; no thyromegaly or nodules palpated; no  lymphadenopathy.    RESP  Clear  P & A; w/o, wheezes/ rales/ or rhonchi. no accessory muscle use, no dullness to percussion  CARD:  RRR, no m/r/g, no peripheral edema, pulses intact, no cyanosis or clubbing.  GI:   Soft & nt; nml bowel sounds; no organomegaly or masses detected.   Musco: Warm bil, no deformities or joint swelling noted.   Neuro: alert, no focal deficits noted.    Skin: Warm, no lesions or rashes    Lab Results:  CBC    Component Value Date/Time   WBC 7.2 05/23/2017 1100   RBC 4.13 (L) 05/23/2017 1100   HGB 11.6 (L) 05/23/2017 1100   HCT 36.2 (L) 05/23/2017 1100   PLT 194 05/23/2017 1100   MCV 87.7 05/23/2017 1100   MCH 28.1 05/23/2017 1100  MCHC 32.0 05/23/2017 1100   RDW 13.3 05/23/2017 1100   LYMPHSABS 0.9 05/18/2017 1629   MONOABS 0.3 05/18/2017 1629   EOSABS 0.0 05/18/2017 1629   BASOSABS 0.0 05/18/2017 1629    BMET    Component Value Date/Time   NA 139 05/24/2017 0453   K 4.4 05/24/2017 0453   CL 107 05/24/2017 0453   CO2 27 05/24/2017 0453   GLUCOSE 82 05/24/2017 0453   BUN 11 05/24/2017 0453   CREATININE 0.63 05/24/2017 0453   CALCIUM 8.7 (L) 05/24/2017 0453   GFRNONAA >60 05/24/2017 0453   GFRAA >60 05/24/2017 0453    BNP No results found for: BNP  ProBNP No results found for: PROBNP  Imaging: Dg Chest 2 View  Result Date: 05/18/2017 CLINICAL DATA:  Patient found down. Altered mental status. Hallucinations. Confusion. EXAM: CHEST  2 VIEW COMPARISON:  10/14/2016. FINDINGS: The heart size and mediastinal contours are within normal limits. The lungs are free of infiltrates or edema. In the posterior segment RIGHT upper lobe, there is a suspected 2.7 x 3.5 cm mass. This is new/ increased from priors. There is some overlap from telemetry leads. CT chest with contrast recommended. The visualized skeletal structures are unremarkable. Prominent colonic distention under the RIGHT and LEFT hemidiaphragms simulates pneumoperitoneum. Healed  RIGHT-sided rib fractures. IMPRESSION: Query 2.7 x 3.5 cm RIGHT upper lobe mass. CT chest with contrast recommended. Considerable colonic distention under the RIGHT and LEFT hemidiaphragms, simulating pneumoperitoneum. Bowel obstruction cannot be excluded on this chest radiograph. Consider flat and erect abdominal films. Electronically Signed   By: Staci Righter M.D.   On: 05/18/2017 16:14   Dg Tibia/fibula Left  Result Date: 05/18/2017 CLINICAL DATA:  Patient found down today. Left lower leg pain with movement. Initial encounter. EXAM: LEFT TIBIA AND FIBULA - 2 VIEW COMPARISON:  Plain films left ankle 06/24/2016. FINDINGS: The patient has healed diaphyseal fractures of the tibia and fibula. The fractures are partially seen on the prior examination. Avulsion fracture of the dorsal calcaneus seen on the prior study has been repaired with a single screw in place. No acute bony abnormality is identified. IMPRESSION: No acute finding. Healed tibial, fibular and calcaneal fractures. Electronically Signed   By: Inge Rise M.D.   On: 05/18/2017 16:11   Ct Head Wo Contrast  Result Date: 05/18/2017 CLINICAL DATA:  EMS reports Home health care nurse called due to being found altered and on kitchen floor unknown LOC unknown down time. Pt experiencing hallucinations. Pt denies pain and has no complaints. Hx DM EXAM: CT HEAD WITHOUT CONTRAST CT CERVICAL SPINE WITHOUT CONTRAST TECHNIQUE: Multidetector CT imaging of the head and cervical spine was performed following the standard protocol without intravenous contrast. Multiplanar CT image reconstructions of the cervical spine were also generated. COMPARISON:  Head CT 03/11/2017, C-spine 05/31/2009 FINDINGS: CT HEAD FINDINGS Brain: No intracranial hemorrhage. No parenchymal contusion. No midline shift or mass effect. Basilar cisterns are patent. No skull base fracture. No fluid in the paranasal sinuses or mastoid air cells. Orbits are normal. Encephalomalacia within  the RIGHT temporal lobe unchanged. Prior RIGHT craniotomy noted. Vascular: No hyperdense vessel or unexpected calcification. Skull: Normal. Negative for fracture or focal lesion. Sinuses/Orbits: Paranasal sinuses and mastoid air cells are clear. Orbits are clear. Other: None. CT CERVICAL SPINE FINDINGS Alignment:  Focal kyphosis in the mid cervical spine. Skull base and vertebrae: Normal craniocervical junction. No loss of vertebral body height or disc height. Normal facet articulation. No evidence of fracture. Soft tissues  and spinal canal: No prevertebral soft tissue swelling. No perispinal or epidural hematoma. Disc levels: Multiple levels of endplate spurring and joint space narrowing. No acute subluxation. Mild anterolisthesis of C3 on C4 is favored degenerative. Upper chest: Clear Other: None IMPRESSION: 1. No acute intracranial trauma. 2. Postprocedural encephalomalacia in the RIGHT temporal lobe unchanged. 3. No cervical spine fracture. 4. Multilevel disc osteophytic disease. 5. Focal kyphosis in the mid cervical spine similar to comparison exam. Electronically Signed   By: Suzy Bouchard M.D.   On: 05/18/2017 16:27   Ct Angio Chest Pe W/cm &/or Wo Cm  Result Date: 05/18/2017 CLINICAL DATA:  Chest pain. EXAM: CT ANGIOGRAPHY CHEST WITH CONTRAST TECHNIQUE: Multidetector CT imaging of the chest was performed using the standard protocol during bolus administration of intravenous contrast. Multiplanar CT image reconstructions and MIPs were obtained to evaluate the vascular anatomy. CONTRAST:  100 cc Isovue 370 intravenously. COMPARISON:  Chest radiograph 05/18/2017 FINDINGS: Cardiovascular: Normal heart size. No pericardial effusion. Calcific atherosclerotic disease of the coronary arteries. No evidence of pulmonary embolus. Mediastinum/Nodes: Right hilar lymphadenopathy measuring collectively 20 mm. Milder right peritracheal lymphadenopathy. Enlarged peribronchial lymph node versus a satellite nodule in  the right upper lobe. Trachea and esophagus are normal. Lungs/Pleura: Spiculated soft tissue mass in the posterior right upper lobe measures 2.8 x 3.5 x 3.6 cm. Prominent spicules extend to the posterior pleural surface. Hazy ground-glass opacities surround this solid soft tissue mass. The previously mentioned likely malignant right hilar lymphadenopathy narrows slightly the right mainstem bronchus. Nodular and ground-glass opacities are seen in the right lower lobe, particularly in its superior segment. Example, linear approximately 1 cm in AP dimension ground-glass opacity, image 53/106, series 11. Area of patchy airspace consolidation versus a satellite nodule is seen in the lateral segment of the right middle lobe. Upper Abdomen: No acute abnormality. Musculoskeletal: No chest wall abnormality. No acute or significant osseous findings. Review of the MIP images confirms the above findings. IMPRESSION: No evidence of pulmonary embolus. Spiculated 3.6 cm right upper lobe soft tissue mass, highly suspicious for primary lung malignancy, with associated right hilar and paratracheal lymphadenopathy. 9 mm soft tissue nodule along the posterior right upper lobe segmental bronchus may represent a satellite nodule versus a peribronchial lymph node. Hazy ground-glass opacities surround the soft tissue mass and may represent direct extension of disease versus surrounding inflammatory changes. Nodular and ground-glass opacities throughout the right lower lobe and right middle lobe may also represent distal spread of disease. These results were called by telephone at the time of interpretation on 05/18/2017 at 8:39 pm to Dr. Sherwood Gambler , who verbally acknowledged these results. Electronically Signed   By: Fidela Salisbury M.D.   On: 05/18/2017 20:42   Ct Cervical Spine Wo Contrast  Result Date: 05/18/2017 CLINICAL DATA:  EMS reports Home health care nurse called due to being found altered and on kitchen floor  unknown LOC unknown down time. Pt experiencing hallucinations. Pt denies pain and has no complaints. Hx DM EXAM: CT HEAD WITHOUT CONTRAST CT CERVICAL SPINE WITHOUT CONTRAST TECHNIQUE: Multidetector CT imaging of the head and cervical spine was performed following the standard protocol without intravenous contrast. Multiplanar CT image reconstructions of the cervical spine were also generated. COMPARISON:  Head CT 03/11/2017, C-spine 05/31/2009 FINDINGS: CT HEAD FINDINGS Brain: No intracranial hemorrhage. No parenchymal contusion. No midline shift or mass effect. Basilar cisterns are patent. No skull base fracture. No fluid in the paranasal sinuses or mastoid air cells. Orbits are normal. Encephalomalacia  within the RIGHT temporal lobe unchanged. Prior RIGHT craniotomy noted. Vascular: No hyperdense vessel or unexpected calcification. Skull: Normal. Negative for fracture or focal lesion. Sinuses/Orbits: Paranasal sinuses and mastoid air cells are clear. Orbits are clear. Other: None. CT CERVICAL SPINE FINDINGS Alignment:  Focal kyphosis in the mid cervical spine. Skull base and vertebrae: Normal craniocervical junction. No loss of vertebral body height or disc height. Normal facet articulation. No evidence of fracture. Soft tissues and spinal canal: No prevertebral soft tissue swelling. No perispinal or epidural hematoma. Disc levels: Multiple levels of endplate spurring and joint space narrowing. No acute subluxation. Mild anterolisthesis of C3 on C4 is favored degenerative. Upper chest: Clear Other: None IMPRESSION: 1. No acute intracranial trauma. 2. Postprocedural encephalomalacia in the RIGHT temporal lobe unchanged. 3. No cervical spine fracture. 4. Multilevel disc osteophytic disease. 5. Focal kyphosis in the mid cervical spine similar to comparison exam. Electronically Signed   By: Suzy Bouchard M.D.   On: 05/18/2017 16:27   Dg Foot Complete Left  Result Date: 05/18/2017 CLINICAL DATA:  Patient found  down today. Left foot pain with movement. Initial encounter. EXAM: LEFT FOOT - COMPLETE 3+ VIEW COMPARISON:  Plain films left ankle 06/24/2006. FINDINGS: No acute bony or joint abnormality is identified. The patient is status post repair of an avulsion fracture of the calcaneus seen on the prior examination. Bones are osteopenic. IMPRESSION: No acute abnormality. Status post repair of a remote avulsion fracture of the calcaneus. Osteopenia. Electronically Signed   By: Inge Rise M.D.   On: 05/18/2017 16:17   US Abdomen Limited Ruq  Result Date: 05/19/2017 CLINICAL DATA:  Elevated LFTs EXAM: ULTRASOUND ABDOMEN LIMITED RIGHT UPPER QUADRANT COMPARISON:  None. FINDINGS: Gallbladder: No gallstones or wall thickening visualized. No sonographic Murphy sign noted by sonographer. Common bile duct: Diameter: 3.6 mm Liver: No focal lesion identified. Within normal limits in parenchymal echogenicity. Portal vein is patent on color Doppler imaging with normal direction of blood flow towards the liver. Cystic lesion is noted within the right kidney, incompletely evaluated on this exam. IMPRESSION: No acute abnormality is noted in the right upper quadrant. Cystic lesion within the right kidney incompletely evaluated on this exam. Electronically Signed   By: Inez Catalina M.D.   On: 05/19/2017 11:43     Assessment & Plan:   Lung mass Lung mass with associated adenopathy suspcious for lung cancer  Set up for PET scan  follow up with Dr. Lamonte Sakai  To disucss results and next step .    Plan  Patient Instructions  Set up PET scan .  Follow up with Dr. Lamonte Sakai 1 week with PFT  To discuss results and next step .  Please contact office for sooner follow up if symptoms do not improve or worsen or seek emergency care         Rexene Edison, NP 05/27/2017

## 2017-05-28 NOTE — Addendum Note (Signed)
Addended by: Parke Poisson E on: 05/28/2017 01:52 PM   Modules accepted: Orders

## 2017-05-31 ENCOUNTER — Encounter (HOSPITAL_COMMUNITY)
Admission: RE | Admit: 2017-05-31 | Discharge: 2017-05-31 | Disposition: A | Discharge status: Home / Self Care | Payer: Medicare HMO | Source: Ambulatory Visit | Attending: Adult Health | Admitting: Adult Health

## 2017-05-31 DIAGNOSIS — R918 Other nonspecific abnormal finding of lung field: Secondary | ICD-10-CM | POA: Diagnosis present

## 2017-05-31 DIAGNOSIS — R911 Solitary pulmonary nodule: Secondary | ICD-10-CM | POA: Diagnosis not present

## 2017-05-31 LAB — GLUCOSE, CAPILLARY: GLUCOSE-CAPILLARY: 79 mg/dL (ref 65–99)

## 2017-05-31 MED ORDER — FLUDEOXYGLUCOSE F - 18 (FDG) INJECTION
6.9700 | Freq: Once | INTRAVENOUS | Status: AC | PRN
  Administered 2017-05-31: 6.97 via INTRAVENOUS

## 2017-06-01 ENCOUNTER — Ambulatory Visit (INDEPENDENT_AMBULATORY_CARE_PROVIDER_SITE_OTHER): Payer: Medicare HMO | Admitting: Emergency Medicine

## 2017-06-01 ENCOUNTER — Encounter: Payer: Self-pay | Admitting: Emergency Medicine

## 2017-06-01 VITALS — BP 128/60 | HR 84

## 2017-06-01 DIAGNOSIS — R918 Other nonspecific abnormal finding of lung field: Secondary | ICD-10-CM

## 2017-06-01 DIAGNOSIS — R911 Solitary pulmonary nodule: Secondary | ICD-10-CM

## 2017-06-01 LAB — PULMONARY FUNCTION TEST
FEF 25-75 Pre: 0.34 L/sec
FEF2575-%Pred-Pre: 12 %
FEV1-%Pred-Pre: 14 %
FEV1-Pre: 0.45 L
FEV1FVC-%Pred-Pre: 56 %
FEV6-%Pred-Pre: 26 %
FEV6-Pre: 1.03 L
FEV6FVC-%Pred-Pre: 104 %
FVC-%Pred-Pre: 25 %
FVC-Pre: 1.03 L
Pre FEV1/FVC ratio: 44 %
Pre FEV6/FVC Ratio: 100 %

## 2017-06-01 NOTE — Assessment & Plan Note (Signed)
Right upper lobe mass with hypermetabolic mediastinal and hilar lymphadenopathy.  Consistent with a primary lung cancer.  I discussed the potential diagnostic procedures that we could pursue.  Given his overall debilitation from his hemiplegia I suspect that he will need to be admitted for observation before discharge back to [facility].  I will work to arrange navigational bronchoscopy plus endobronchial ultrasound.  He understands the plans, risks.  He agrees to proceed. 35 minutes spent explaining the options nas making the plans.

## 2017-06-01 NOTE — Progress Notes (Signed)
$'@Patient'D$  ID: Wyatt Salas, male    DOB: [birth year], 66 y.o.   MRN: [id number]  Chief Complaint  Patient presents with  . Lung Mass    Review PET scan results.    Referring provider: [name], MD  HPI: 66 year old male with a history of alcohol abuse former smoker seen for pulmonary consult during hospitalization October 2018.  Patient was admitted after a traumatic fall.  During his workup a CT scan of the chest identified a 3.6 right upper lobe spiculated mass with associated right hilar and paratracheal lymphadenopathy.  TEST  CT chest 10/30: No evidence of pulmonary embolus.Spiculated 3.6 cm right upper lobe soft tissue mass, highly suspicious for primary lung malignancy, with associated right hilar and paratracheal lymphadenopathy. 9 mm soft tissue nodule along the posterior right upper lobe segmental bronchus may represent a satellite nodule versus a peribronchial lymph node.Hazy ground-glass opacities surround the soft tissue mass and may represent direct extension of disease versus surrounding inflammatory changes.Nodular and ground-glass opacities throughout the right lower lobe and right middle lobe may also represent distal spread of disease.  Follow up: Lung Mass Patient presents for a post hospital follow-up.  Patient was admitted last month after a traumatic fall with head trauma.  During patient's workup a CT chest was done that showed a spiculated 3.6 right upper lobe lung mass highly suspicious for primary lung malignancy.  There was associated right hilar and paratracheal lymphadenopathy.  9 mm soft tissue nodule along the posterior right upper lobe segmental bronchus and may represent direct extension of disease versus surrounding inflammatory changes. Patient denies any chest pain orthopnea PND or leg swelling.  No hemoptysis.  Weight has been trending down over the last 2 years.  Pt is in rehab currently . Prior to admission he lived in an apartment by himself  . 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 Walks with cane and has power wheelchair. [facility] [place]. Former smoker , quit 1 year ago . Smoked cigs, cigars, THC . Drinks beers when he has them.   ROV 06/01/17.  66 year old smoker with a history of alcohol abuse whom I met in the hospital 04/2017 after a fall.  There was a spurious identification of a 3.6 right upper lobe spiculated lesion with some associated right hilar and paratracheal adenopathy.  He was discharged to rehab.  He follows up today to discuss his abnormal CT scan of the chest and a PET scan that was done on 05/31/17.  I have reviewed the scan and it shows hypermetabolism in the RUL mass as well R hilar and mediastinal LAD   Physical Exam  Vitals:   06/01/17 0933  BP: 128/60  Pulse: 84  SpO2: 100%   Gen: Pleasant, thin, in no distress,  normal affect  ENT: No lesions,  mouth clear,  oropharynx clear, no postnasal drip, poor dentition  Neck: No JVD, no stridor  Lungs: No use of accessory muscles, distant, no wheezes.   Cardiovascular: RRR, heart sounds normal, no murmur or gallops, no peripheral edema  Musculoskeletal: No deformities, no cyanosis or clubbing  Neuro: alert, appro[priate. L UE nad LE weakness, more in the LE  Skin: Warm, no lesions or rashes   ,   Lab Results:  CBC    Component Value Date/Time   WBC 7.2 05/23/2017 1100   RBC 4.13 (L) 05/23/2017 1100   HGB 11.6 (L) 05/23/2017 1100   HCT 36.2 (L) 05/23/2017  1100   PLT 194 05/23/2017 1100   MCV 87.7 05/23/2017 1100   MCH 28.1 05/23/2017 1100   MCHC 32.0 05/23/2017 1100   RDW 13.3 05/23/2017 1100   LYMPHSABS 0.9 05/18/2017 1629   MONOABS 0.3 05/18/2017 1629   EOSABS 0.0 05/18/2017 1629   BASOSABS 0.0 05/18/2017 1629    BMET    Component Value Date/Time   NA 139 05/24/2017 0453   K 4.4 05/24/2017 0453   CL 107 05/24/2017 0453   CO2 27 05/24/2017 0453   GLUCOSE 82 05/24/2017 0453   BUN  11 05/24/2017 0453   CREATININE 0.63 05/24/2017 0453   CALCIUM 8.7 (L) 05/24/2017 0453   GFRNONAA >60 05/24/2017 0453   GFRAA >60 05/24/2017 0453    BNP No results found for: BNP  ProBNP No results found for: PROBNP  Imaging: Dg Chest 2 View  Result Date: 05/18/2017 CLINICAL DATA:  Patient found down. Altered mental status. Hallucinations. Confusion. EXAM: CHEST  2 VIEW COMPARISON:  10/14/2016. FINDINGS: The heart size and mediastinal contours are within normal limits. The lungs are free of infiltrates or edema. In the posterior segment RIGHT upper lobe, there is a suspected 2.7 x 3.5 cm mass. This is new/ increased from priors. There is some overlap from telemetry leads. CT chest with contrast recommended. The visualized skeletal structures are unremarkable. Prominent colonic distention under the RIGHT and LEFT hemidiaphragms simulates pneumoperitoneum. Healed RIGHT-sided rib fractures. IMPRESSION: Query 2.7 x 3.5 cm RIGHT upper lobe mass. CT chest with contrast recommended. Considerable colonic distention under the RIGHT and LEFT hemidiaphragms, simulating pneumoperitoneum. Bowel obstruction cannot be excluded on this chest radiograph. Consider flat and erect abdominal films. Electronically Signed   By: Staci Righter M.D.   On: 05/18/2017 16:14   Dg Tibia/fibula Left  Result Date: 05/18/2017 CLINICAL DATA:  Patient found down today. Left lower leg pain with movement. Initial encounter. EXAM: LEFT TIBIA AND FIBULA - 2 VIEW COMPARISON:  Plain films left ankle 06/24/2016. FINDINGS: The patient has healed diaphyseal fractures of the tibia and fibula. The fractures are partially seen on the prior examination. Avulsion fracture of the dorsal calcaneus seen on the prior study has been repaired with a single screw in place. No acute bony abnormality is identified. IMPRESSION: No acute finding. Healed tibial, fibular and calcaneal fractures. Electronically Signed   By: Inge Rise M.D.   On:  05/18/2017 16:11   Ct Head Wo Contrast  Result Date: 05/18/2017 CLINICAL DATA:  EMS reports Home health care nurse called due to being found altered and on kitchen floor unknown LOC unknown down time. Pt experiencing hallucinations. Pt denies pain and has no complaints. Hx DM EXAM: CT HEAD WITHOUT CONTRAST CT CERVICAL SPINE WITHOUT CONTRAST TECHNIQUE: Multidetector CT imaging of the head and cervical spine was performed following the standard protocol without intravenous contrast. Multiplanar CT image reconstructions of the cervical spine were also generated. COMPARISON:  Head CT 03/11/2017, C-spine 05/31/2009 FINDINGS: CT HEAD FINDINGS Brain: No intracranial hemorrhage. No parenchymal contusion. No midline shift or mass effect. Basilar cisterns are patent. No skull base fracture. No fluid in the paranasal sinuses or mastoid air cells. Orbits are normal. Encephalomalacia within the RIGHT temporal lobe unchanged. Prior RIGHT craniotomy noted. Vascular: No hyperdense vessel or unexpected calcification. Skull: Normal. Negative for fracture or focal lesion. Sinuses/Orbits: Paranasal sinuses and mastoid air cells are clear. Orbits are clear. Other: None. CT CERVICAL SPINE FINDINGS Alignment:  Focal kyphosis in the mid cervical spine. Skull base and vertebrae:  Normal craniocervical junction. No loss of vertebral body height or disc height. Normal facet articulation. No evidence of fracture. Soft tissues and spinal canal: No prevertebral soft tissue swelling. No perispinal or epidural hematoma. Disc levels: Multiple levels of endplate spurring and joint space narrowing. No acute subluxation. Mild anterolisthesis of C3 on C4 is favored degenerative. Upper chest: Clear Other: None IMPRESSION: 1. No acute intracranial trauma. 2. Postprocedural encephalomalacia in the RIGHT temporal lobe unchanged. 3. No cervical spine fracture. 4. Multilevel disc osteophytic disease. 5. Focal kyphosis in the mid cervical spine similar to  comparison exam. Electronically Signed   By: Suzy Bouchard M.D.   On: 05/18/2017 16:27   Ct Angio Chest Pe W/cm &/or Wo Cm  Result Date: 05/18/2017 CLINICAL DATA:  Chest pain. EXAM: CT ANGIOGRAPHY CHEST WITH CONTRAST TECHNIQUE: Multidetector CT imaging of the chest was performed using the standard protocol during bolus administration of intravenous contrast. Multiplanar CT image reconstructions and MIPs were obtained to evaluate the vascular anatomy. CONTRAST:  100 cc Isovue 370 intravenously. COMPARISON:  Chest radiograph 05/18/2017 FINDINGS: Cardiovascular: Normal heart size. No pericardial effusion. Calcific atherosclerotic disease of the coronary arteries. No evidence of pulmonary embolus. Mediastinum/Nodes: Right hilar lymphadenopathy measuring collectively 20 mm. Milder right peritracheal lymphadenopathy. Enlarged peribronchial lymph node versus a satellite nodule in the right upper lobe. Trachea and esophagus are normal. Lungs/Pleura: Spiculated soft tissue mass in the posterior right upper lobe measures 2.8 x 3.5 x 3.6 cm. Prominent spicules extend to the posterior pleural surface. Hazy ground-glass opacities surround this solid soft tissue mass. The previously mentioned likely malignant right hilar lymphadenopathy narrows slightly the right mainstem bronchus. Nodular and ground-glass opacities are seen in the right lower lobe, particularly in its superior segment. Example, linear approximately 1 cm in AP dimension ground-glass opacity, image 53/106, series 11. Area of patchy airspace consolidation versus a satellite nodule is seen in the lateral segment of the right middle lobe. Upper Abdomen: No acute abnormality. Musculoskeletal: No chest wall abnormality. No acute or significant osseous findings. Review of the MIP images confirms the above findings. IMPRESSION: No evidence of pulmonary embolus. Spiculated 3.6 cm right upper lobe soft tissue mass, highly suspicious for primary lung malignancy,  with associated right hilar and paratracheal lymphadenopathy. 9 mm soft tissue nodule along the posterior right upper lobe segmental bronchus may represent a satellite nodule versus a peribronchial lymph node. Hazy ground-glass opacities surround the soft tissue mass and may represent direct extension of disease versus surrounding inflammatory changes. Nodular and ground-glass opacities throughout the right lower lobe and right middle lobe may also represent distal spread of disease. These results were called by telephone at the time of interpretation on 05/18/2017 at 8:39 pm to Dr. Sherwood Gambler , who verbally acknowledged these results. Electronically Signed   By: Fidela Salisbury M.D.   On: 05/18/2017 20:42   Ct Cervical Spine Wo Contrast  Result Date: 05/18/2017 CLINICAL DATA:  EMS reports Home health care nurse called due to being found altered and on kitchen floor unknown LOC unknown down time. Pt experiencing hallucinations. Pt denies pain and has no complaints. Hx DM EXAM: CT HEAD WITHOUT CONTRAST CT CERVICAL SPINE WITHOUT CONTRAST TECHNIQUE: Multidetector CT imaging of the head and cervical spine was performed following the standard protocol without intravenous contrast. Multiplanar CT image reconstructions of the cervical spine were also generated. COMPARISON:  Head CT 03/11/2017, C-spine 05/31/2009 FINDINGS: CT HEAD FINDINGS Brain: No intracranial hemorrhage. No parenchymal contusion. No midline shift or mass effect. Basilar  cisterns are patent. No skull base fracture. No fluid in the paranasal sinuses or mastoid air cells. Orbits are normal. Encephalomalacia within the RIGHT temporal lobe unchanged. Prior RIGHT craniotomy noted. Vascular: No hyperdense vessel or unexpected calcification. Skull: Normal. Negative for fracture or focal lesion. Sinuses/Orbits: Paranasal sinuses and mastoid air cells are clear. Orbits are clear. Other: None. CT CERVICAL SPINE FINDINGS Alignment:  Focal kyphosis in  the mid cervical spine. Skull base and vertebrae: Normal craniocervical junction. No loss of vertebral body height or disc height. Normal facet articulation. No evidence of fracture. Soft tissues and spinal canal: No prevertebral soft tissue swelling. No perispinal or epidural hematoma. Disc levels: Multiple levels of endplate spurring and joint space narrowing. No acute subluxation. Mild anterolisthesis of C3 on C4 is favored degenerative. Upper chest: Clear Other: None IMPRESSION: 1. No acute intracranial trauma. 2. Postprocedural encephalomalacia in the RIGHT temporal lobe unchanged. 3. No cervical spine fracture. 4. Multilevel disc osteophytic disease. 5. Focal kyphosis in the mid cervical spine similar to comparison exam. Electronically Signed   By: Suzy Bouchard M.D.   On: 05/18/2017 16:27   Nm Pet Image Initial (pi) Skull Base To Thigh  Result Date: 05/31/2017 CLINICAL DATA:  Initial treatment strategy for right upper lobe mass. EXAM: NUCLEAR MEDICINE PET SKULL BASE TO THIGH TECHNIQUE: 7.0 mCi F-18 FDG was injected intravenously. Full-ring PET imaging was performed from the skull base to thigh after the radiotracer. CT data was obtained and used for attenuation correction and anatomic localization. FASTING BLOOD GLUCOSE:  Value: 7 and mg/dl COMPARISON:  Chest CT 05/18/2017 FINDINGS: NECK No hypermetabolic lymph nodes in the neck. Glottic activity is likely physiologic. Minimal chronic right maxillary sinusitis. Multifocal dental disease/dental cavities. CHEST The spiculated right upper lobe mass currently measures 3.6 by 3.4 cm on image 48/4 and has a maximum standard uptake value of 15.1. A subtending peribronchovascular lymph node near this lesion measures approximately 10 mm in diameter and has a maximum SUV of 14.7. An enlarged right hilar lymph node measuring approximately 2.0 cm in short axis has a maximum SUV of 12.5. Abnormal faintly accentuated activity in a right lower paratracheal lymph  node measuring 1.3 cm in short axis on image 51/4, maximum SUV 5.7. Background mediastinal blood pool activity SUV 2.0. A prevascular lymph node measuring 0.7 cm in short axis on image 42/4 has a maximum SUV of 3.0. Trace right pleural effusion without definite hypermetabolic activity. Tree-in-bud reticulonodular opacities in the right lower lobe with some associated calcifications favoring atypical infectious bronchiolitis or the residua there of. Lesser similar findings medially in the left lower lobe and laterally in the right middle lobe. Aspirated barium can sometimes have a similar appearance. ABDOMEN/PELVIS No abnormal hypermetabolic activity within the liver, pancreas, adrenal glands, or spleen. No hypermetabolic lymph nodes in the abdomen or pelvis. Right mid kidney cysts. Aortoiliac atherosclerotic vascular disease. Prominently distended urinary bladder extends up above the iliac crests. SKELETON No focal hypermetabolic activity to suggest skeletal metastasis. Heterotopic ossification adjacent to the left greater trochanter and posterior to the left hip joint. Advanced cervical spondylosis. IMPRESSION: 1. Spiculated hypermetabolic 3.6 cm right upper lobe mass with small subtending peribronchovascular hypermetabolic lymph node and associated hypermetabolic right hilar and right lower paratracheal adenopathy. Questionably hypermetabolic small prevascular lymph node in the upper mediastinum. Assuming non-small cell lung cancer, this represents T2a N2 M0 disease (stage IIIA). 2. Prominent distention of the urinary bladder, cause uncertain. 3. Other imaging findings of potential clinical significance: Minimal chronic right  maxillary sinusitis. Dental cavities. Trace right pleural effusion. Tree-in-bud reticulonodular opacities in the lung bases, especially the right lower lobe, possibly from aspirated barium or residua from chronic atypical infectious bronchiolitis. Aortic Atherosclerosis (ICD10-I70.0).  Advanced cervical spondylosis. Electronically Signed   By: Van Clines M.D.   On: 05/31/2017 12:06   Dg Foot Complete Left  Result Date: 05/18/2017 CLINICAL DATA:  Patient found down today. Left foot pain with movement. Initial encounter. EXAM: LEFT FOOT - COMPLETE 3+ VIEW COMPARISON:  Plain films left ankle 06/24/2006. FINDINGS: No acute bony or joint abnormality is identified. The patient is status post repair of an avulsion fracture of the calcaneus seen on the prior examination. Bones are osteopenic. IMPRESSION: No acute abnormality. Status post repair of a remote avulsion fracture of the calcaneus. Osteopenia. Electronically Signed   By: Inge Rise M.D.   On: 05/18/2017 16:17   US Abdomen Limited Ruq  Result Date: 05/19/2017 CLINICAL DATA:  Elevated LFTs EXAM: ULTRASOUND ABDOMEN LIMITED RIGHT UPPER QUADRANT COMPARISON:  None. FINDINGS: Gallbladder: No gallstones or wall thickening visualized. No sonographic Murphy sign noted by sonographer. Common bile duct: Diameter: 3.6 mm Liver: No focal lesion identified. Within normal limits in parenchymal echogenicity. Portal vein is patent on color Doppler imaging with normal direction of blood flow towards the liver. Cystic lesion is noted within the right kidney, incompletely evaluated on this exam. IMPRESSION: No acute abnormality is noted in the right upper quadrant. Cystic lesion within the right kidney incompletely evaluated on this exam. Electronically Signed   By: Inez Catalina M.D.   On: 05/19/2017 11:43     PET scan 05/31/17 --  COMPARISON:  Chest CT 05/18/2017  FINDINGS: NECK  No hypermetabolic lymph nodes in the neck. Glottic activity is likely physiologic.  Minimal chronic right maxillary sinusitis. Multifocal dental disease/dental cavities.  CHEST  The spiculated right upper lobe mass currently measures 3.6 by 3.4 cm on image 48/4 and has a maximum standard uptake value of 15.1.  A subtending  peribronchovascular lymph node near this lesion measures approximately 10 mm in diameter and has a maximum SUV of 14.7.  An enlarged right hilar lymph node measuring approximately 2.0 cm in short axis has a maximum SUV of 12.5.  Abnormal faintly accentuated activity in a right lower paratracheal lymph node measuring 1.3 cm in short axis on image 51/4, maximum SUV 5.7. Background mediastinal blood pool activity SUV 2.0.  A prevascular lymph node measuring 0.7 cm in short axis on image 42/4 has a maximum SUV of 3.0.  Trace right pleural effusion without definite hypermetabolic activity. Tree-in-bud reticulonodular opacities in the right lower lobe with some associated calcifications favoring atypical infectious bronchiolitis or the residua there of. Lesser similar findings medially in the left lower lobe and laterally in the right middle lobe. Aspirated barium can sometimes have a similar appearance.  ABDOMEN/PELVIS  No abnormal hypermetabolic activity within the liver, pancreas, adrenal glands, or spleen. No hypermetabolic lymph nodes in the abdomen or pelvis.  Right mid kidney cysts. Aortoiliac atherosclerotic vascular disease. Prominently distended urinary bladder extends up above the iliac crests.  SKELETON  No focal hypermetabolic activity to suggest skeletal metastasis.  Heterotopic ossification adjacent to the left greater trochanter and posterior to the left hip joint.  Advanced cervical spondylosis.  IMPRESSION: 1. Spiculated hypermetabolic 3.6 cm right upper lobe mass with small subtending peribronchovascular hypermetabolic lymph node and associated hypermetabolic right hilar and right lower paratracheal adenopathy. Questionably hypermetabolic small prevascular lymph node in the upper mediastinum.  Assuming non-small cell lung cancer, this represents T2a N2 M0 disease (stage IIIA). 2. Prominent distention of the urinary bladder, cause uncertain. 3.  Other imaging findings of potential clinical significance: Minimal chronic right maxillary sinusitis. Dental cavities. Trace right pleural effusion. Tree-in-bud reticulonodular opacities in the lung bases, especially the right lower lobe, possibly from aspirated barium or residua from chronic atypical infectious bronchiolitis. Aortic Atherosclerosis (ICD10-I70.0). Advanced cervical spondylosis.     Assessment & Plan:   Lung mass Right upper lobe mass with hypermetabolic mediastinal and hilar lymphadenopathy.  Consistent with a primary lung cancer.  I discussed the potential diagnostic procedures that we could pursue.  Given his overall debilitation from his hemiplegia I suspect that he will need to be admitted for observation before discharge back to [facility].  I will work to arrange navigational bronchoscopy plus endobronchial ultrasound.  He understands the plans, risks.  He agrees to proceed. 35 minutes spent explaining the options nas making the plans.    Baltazar Apo, MD, PhD 06/01/2017, 10:02 AM [facility] [phone] or if no answer [phone]

## 2017-06-01 NOTE — Patient Instructions (Addendum)
We will work on setting up a procedure to biopsy your lung nodule and your chest lymph nodes to evaluate for possible lung cancer. Please continue your current medications as you have been taking them We will likely plan to admit you to the hospital for observation after your procedure to ensure that you are stable before being discharged back to home.  Follow with Dr Lamonte Sakai in 1 month

## 2017-06-01 NOTE — Progress Notes (Signed)
Attempted PFT today. Pt was unable to follow directions to obtain reproducible data that meet ATS standards. Wyatt Salas was consulted before pt was discharged.

## 2017-06-14 ENCOUNTER — Encounter (HOSPITAL_COMMUNITY): Payer: Self-pay | Admitting: *Deleted

## 2017-06-15 ENCOUNTER — Telehealth: Payer: Self-pay | Admitting: Emergency Medicine

## 2017-06-15 NOTE — Telephone Encounter (Signed)
Spoke with Opal Sidles who stated that pt cancelling bronch due to wanting to get a second opinion.  Opal Sidles stated to me that we can call pt back next week to see if he was able to get a second opinion or if he wants [place] to reschedule him for a bronch.  Nothing further needed.

## 2017-06-15 NOTE — Telephone Encounter (Signed)
It appears that the provided number is incorrect, as that is cardio pulmonary number.  I have spoken to [place] with resp, who states she was not aware of pt canceling apt. Baxter Flattery was able to transfer me per admit. Im for Opal Sidles with pre admit to return our call.

## 2017-06-15 NOTE — Progress Notes (Signed)
Patient states that he is getting a second opinion at the [place] and then will decide.

## 2017-06-16 ENCOUNTER — Ambulatory Visit (HOSPITAL_COMMUNITY): Admission: RE | Admit: 2017-06-16 | Payer: Medicare HMO | Source: Ambulatory Visit | Admitting: Emergency Medicine

## 2017-06-16 SURGERY — VIDEO BRONCHOSCOPY WITH ENDOBRONCHIAL NAVIGATION
Anesthesia: General | Laterality: Right

## 2017-09-13 ENCOUNTER — Other Ambulatory Visit: Payer: Self-pay

## 2017-09-13 ENCOUNTER — Encounter (HOSPITAL_COMMUNITY): Payer: Self-pay

## 2017-09-13 ENCOUNTER — Emergency Department (HOSPITAL_COMMUNITY)
Admission: EM | Admit: 2017-09-13 | Discharge: 2017-09-13 | Disposition: A | Discharge status: Home / Self Care | Payer: Medicare HMO | Attending: Emergency Medicine | Admitting: Emergency Medicine

## 2017-09-13 ENCOUNTER — Emergency Department (HOSPITAL_COMMUNITY): Payer: Medicare HMO

## 2017-09-13 DIAGNOSIS — Z79899 Other long term (current) drug therapy: Secondary | ICD-10-CM | POA: Diagnosis not present

## 2017-09-13 DIAGNOSIS — S8255XA Nondisplaced fracture of medial malleolus of left tibia, initial encounter for closed fracture: Secondary | ICD-10-CM | POA: Insufficient documentation

## 2017-09-13 DIAGNOSIS — Y999 Unspecified external cause status: Secondary | ICD-10-CM | POA: Insufficient documentation

## 2017-09-13 DIAGNOSIS — I252 Old myocardial infarction: Secondary | ICD-10-CM | POA: Diagnosis not present

## 2017-09-13 DIAGNOSIS — Y92032 Bedroom in apartment as the place of occurrence of the external cause: Secondary | ICD-10-CM | POA: Diagnosis not present

## 2017-09-13 DIAGNOSIS — Z87891 Personal history of nicotine dependence: Secondary | ICD-10-CM | POA: Diagnosis not present

## 2017-09-13 DIAGNOSIS — E119 Type 2 diabetes mellitus without complications: Secondary | ICD-10-CM | POA: Insufficient documentation

## 2017-09-13 DIAGNOSIS — Y9389 Activity, other specified: Secondary | ICD-10-CM | POA: Diagnosis not present

## 2017-09-13 DIAGNOSIS — W06XXXA Fall from bed, initial encounter: Secondary | ICD-10-CM | POA: Insufficient documentation

## 2017-09-13 DIAGNOSIS — Z87820 Personal history of traumatic brain injury: Secondary | ICD-10-CM | POA: Insufficient documentation

## 2017-09-13 DIAGNOSIS — Z7984 Long term (current) use of oral hypoglycemic drugs: Secondary | ICD-10-CM | POA: Insufficient documentation

## 2017-09-13 DIAGNOSIS — S99912A Unspecified injury of left ankle, initial encounter: Secondary | ICD-10-CM | POA: Diagnosis present

## 2017-09-13 MED ORDER — HYDROCODONE-ACETAMINOPHEN 5-325 MG PO TABS: 1.0000 | ORAL_TABLET | Freq: Four times a day (QID) | ORAL | 0 refills | Status: AC | PRN

## 2017-09-13 NOTE — ED Triage Notes (Signed)
EMS reports from home pt attempted transfer from bed to wheelchair and fell to ground. No obvious injury, c/o left lower leg and foot pain. 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

## 2017-09-13 NOTE — ED Notes (Signed)
Bed: [facility] Expected date:  Expected time:  Means of arrival:  Comments: EMS-fall

## 2017-09-13 NOTE — Progress Notes (Signed)
Orthopedic Tech Progress Note Patient Details:  Wyatt Salas 1951-06-26 [id number]  Ortho Devices Type of Ortho Device: Post (short leg) splint       [place] Pink 09/13/2017, 12:51 PM

## 2017-09-13 NOTE — ED Provider Notes (Signed)
[facility] DEPT Provider Note   CSN: [id number] Arrival date & time: 09/13/17  1941     History   Chief Complaint Chief Complaint  Patient presents with  . Fall    HPI Wyatt Salas is a 67 y.o. male.  HPI Patient states that he fell while at home trying to transfer from bed to wheelchair.  Landed on his left ankle.  Denies any head injury or syncope.  Complains only of left ankle pain.  No new weakness or numbness. Past Medical History:  Diagnosis Date  . Diabetes mellitus without complication ([place])    Type II  . Diastolic dysfunction    a. ECHO 07/2013 EF 55-60% and grade 1 diastolic dysfunction  . NSTEMI (non-ST elevated myocardial infarction) ([place])    problem list 01/2014  . Rhabdomyolysis 01/2014  . Seizures ([place])   . Syncope 01/2014  . Traumatic brain injury [facility])     Patient Active Problem List   Diagnosis Date Noted  . Abnormal liver function   . Lung mass   . Essential hypertension   . Altered mental status 05/18/2017  . Right sided weakness 10/14/2016  . Acute urinary tract infection 10/14/2016  . Elevated troponin 10/14/2016  . Type 2 diabetes mellitus with hyperlipidemia ([place]) 10/14/2016  . Fall at home--mechanical 01/26/2014  . Elevated CK 01/26/2014  . Seizure disorder ([place]) 07/26/2013  . NSTEMI (non-ST elevated myocardial infarction) ([place]) 07/26/2013  . Hyperlipidemia 07/26/2013  . Aspiration pneumonia ([place]) 07/26/2013  . Rhabdomyolysis 07/26/2013    Past Surgical History:  Procedure Laterality Date  . CRANIOTOMY         Home Medications    Prior to Admission medications   Medication Sig Start Date End Date Taking? Authorizing Provider  acetaminophen (TYLENOL) 500 MG tablet Take 1,000 mg by mouth every 12 (twelve) hours as needed for mild pain or fever.    [provider]  carboxymethylcellulose (REFRESH PLUS) 0.5 % SOLN Place 1 drop into both eyes 2 (two) times daily as needed (Dry eyes).     [provider]  docusate sodium (COLACE) 100 MG capsule Take 200 mg by mouth 2 (two) times daily.    [provider]  ENSURE PLUS (ENSURE PLUS) LIQD Take 237 mLs by mouth 2 (two) times daily between meals.    [provider]  folic acid (FOLVITE) 1 MG tablet Take 1 tablet (1 mg total) daily by mouth. 05/25/17   Barton Dubois, MD  galantamine (RAZADYNE) 8 MG tablet Take 1 tablet (8 mg total) by mouth 2 (two) times daily with a meal. 07/31/13   [name], MD  HYDROcodone-acetaminophen (NORCO) 5-325 MG tablet Take 1 tablet by mouth every 6 (six) hours as needed for severe pain. 09/13/17   Julianne Rice, MD  lacosamide (VIMPAT) 50 MG TABS tablet Take 100 mg by mouth 2 (two) times daily.    [provider]  levETIRAcetam (KEPPRA) 750 MG tablet Take 1,500 mg by mouth 2 (two) times daily.    [provider]  lisinopril (PRINIVIL,ZESTRIL) 5 MG tablet Take 5 mg by mouth daily.    [provider]  metFORMIN (GLUCOPHAGE) 500 MG tablet Take 500 mg by mouth daily with breakfast.     [provider]  Multiple Vitamin (MULTIVITAMIN WITH MINERALS) TABS tablet Take 1 tablet by mouth daily.    [provider]  phenytoin (DILANTIN) 30 MG ER capsule Take 1 capsule (30 mg total) by mouth 2 (two) times daily.  Patient taking differently: Take 30 mg daily by mouth.  10/15/16 05/27/18  Arrien, [name], MD  simvastatin (ZOCOR) 80 MG tablet Take 80 mg at bedtime by mouth.     [provider]  thiamine 100 MG tablet Take 1 tablet (100 mg total) daily by mouth. 05/25/17   Barton Dubois, MD    Family History Family History  Problem Relation Age of Onset  . Headache Mother   . Diabetes Neg Hx   . Stroke Neg Hx     Social History Social History   Tobacco Use  . Smoking status: Former Smoker    Types: Cigars  . Smokeless tobacco: Never Used  Substance Use Topics  . Alcohol use: Yes    Comment: rare  . Drug use: Yes     Types: Marijuana     Allergies   Shellfish allergy   Review of Systems Review of Systems  Constitutional: Negative for chills and fever.  Respiratory: Negative for cough and shortness of breath.   Cardiovascular: Negative for chest pain, palpitations and leg swelling.  Gastrointestinal: Negative for abdominal pain, nausea and vomiting.  Musculoskeletal: Positive for arthralgias. Negative for joint swelling, neck pain and neck stiffness.  Skin: Negative for rash and wound.  Neurological: Negative for syncope, weakness, numbness and headaches.  All other systems reviewed and are negative.    Physical Exam Updated Vital Signs BP 108/78   Pulse 72   Temp 98.4 F (36.9 C) (Oral)   Resp 16   Ht 6\' 1"  (1.854 m)   Wt 65.8 kg (145 lb)   SpO2 100%   BMI 19.13 kg/m   Physical Exam  Constitutional: He is oriented to person, place, and time. He appears well-developed and well-nourished.  HENT:  Head: Normocephalic and atraumatic.  No obvious facial or scalp injury.  No intraoral injury.  Eyes: EOM are normal. Pupils are equal, round, and reactive to light.  Neck: Normal range of motion. Neck supple.  No posterior midline cervical tenderness to palpation.  Cardiovascular: Normal rate and regular rhythm. Exam reveals no gallop and no friction rub.  No murmur heard. Pulmonary/Chest: Effort normal and breath sounds normal.  Abdominal: Soft. Bowel sounds are normal. There is no tenderness. There is no rebound and no guarding.  Musculoskeletal: Normal range of motion. He exhibits tenderness. He exhibits no edema.  Patient with tenderness to palpation over the lateral malleolus of the left ankle mild tenderness palpation over the medial malleolus.  No obvious swelling or deformity.  No proximal fibular tenderness.  Full range of motion of the left knee and left hip.  Distal pulses are 2+.  Neurological: He is alert and oriented to person, place, and time.  Skin: Skin is warm and dry. No  rash noted. No erythema.  Psychiatric: He has a normal mood and affect. His behavior is normal.  Nursing note and vitals reviewed.    ED Treatments / Results  Labs (all labs ordered are listed, but only abnormal results are displayed) Labs Reviewed - No data to display  EKG  EKG Interpretation None       Radiology Dg Ankle Complete Left  Result Date: 09/13/2017 CLINICAL DATA:  Diffuse LEFT ankle pain, fell yesterday EXAM: LEFT ANKLE COMPLETE - 3+ VIEW COMPARISON:  05/18/2017, 06/24/2006 FINDINGS: Diffuse osseous demineralization. Deformity of the mid LEFT fibular diaphysis secondary to old healed fracture. Previously identified mid LEFT tibial fracture is only marginally visualized at the cranial aspect of the exam. Single screw identified  at the posterior LEFT calcaneus with prominent posterior calcaneal spurring likely reflecting repair of prior posterior calcaneal/Achilles avulsion fracture. Joint spaces preserved. Mild medial soft tissue swelling at ankle. Slight cortical irregularity at the medial malleolus, configuration slightly different than on previous exams, question subtle fracture. No additional acute fracture, dislocation or bone destruction. IMPRESSION: Old LEFT tibial and fibular diaphyseal fractures. Postsurgical and probable posttraumatic deformities of the posterior LEFT calcaneus. Osseous demineralization with suspicion of a subtle fracture at medial malleolus. Electronically Signed   By: Lavonia Dana M.D.   On: 09/13/2017 08:59    Procedures Procedures (including critical care time)  Medications Ordered in ED Medications - No data to display   Initial Impression / Assessment and Plan / ED Course  I have reviewed the triage vital signs and the nursing notes.  Pertinent labs & imaging results that were available during my care of the patient were reviewed by me and considered in my medical decision making (see chart for details).     Questionable fracture of  the medial mat malleolus.  Previous fractures identified on x-ray.  Patient was placed in splint.  Is advised to follow-up with orthopedics.  Return precautions been given.  Final Clinical Impressions(s) / ED Diagnoses   Final diagnoses:  Nondisplaced fracture of medial malleolus of left tibia, initial encounter for closed fracture    ED Discharge Orders        Ordered    HYDROcodone-acetaminophen (NORCO) 5-325 MG tablet  Every 6 hours PRN     09/13/17 1136       Julianne Rice, MD 09/13/17 1137

## 2017-09-20 ENCOUNTER — Encounter (HOSPITAL_COMMUNITY): Payer: Self-pay | Admitting: Family Medicine

## 2017-09-20 ENCOUNTER — Emergency Department (HOSPITAL_COMMUNITY): Payer: Medicare HMO

## 2017-09-20 DIAGNOSIS — E785 Hyperlipidemia, unspecified: Secondary | ICD-10-CM | POA: Diagnosis not present

## 2017-09-20 DIAGNOSIS — S42452A Displaced fracture of lateral condyle of left humerus, initial encounter for closed fracture: Secondary | ICD-10-CM | POA: Diagnosis not present

## 2017-09-20 DIAGNOSIS — Y999 Unspecified external cause status: Secondary | ICD-10-CM | POA: Diagnosis not present

## 2017-09-20 DIAGNOSIS — W0110XA Fall on same level from slipping, tripping and stumbling with subsequent striking against unspecified object, initial encounter: Secondary | ICD-10-CM | POA: Diagnosis not present

## 2017-09-20 DIAGNOSIS — Z87820 Personal history of traumatic brain injury: Secondary | ICD-10-CM | POA: Insufficient documentation

## 2017-09-20 DIAGNOSIS — Y9389 Activity, other specified: Secondary | ICD-10-CM | POA: Diagnosis not present

## 2017-09-20 DIAGNOSIS — E1169 Type 2 diabetes mellitus with other specified complication: Secondary | ICD-10-CM | POA: Diagnosis not present

## 2017-09-20 DIAGNOSIS — Z87891 Personal history of nicotine dependence: Secondary | ICD-10-CM | POA: Diagnosis not present

## 2017-09-20 DIAGNOSIS — Z79899 Other long term (current) drug therapy: Secondary | ICD-10-CM | POA: Diagnosis not present

## 2017-09-20 DIAGNOSIS — Z7984 Long term (current) use of oral hypoglycemic drugs: Secondary | ICD-10-CM | POA: Diagnosis not present

## 2017-09-20 DIAGNOSIS — S4992XA Unspecified injury of left shoulder and upper arm, initial encounter: Secondary | ICD-10-CM | POA: Diagnosis present

## 2017-09-20 DIAGNOSIS — I10 Essential (primary) hypertension: Secondary | ICD-10-CM | POA: Diagnosis not present

## 2017-09-20 DIAGNOSIS — Y92003 Bedroom of unspecified non-institutional (private) residence as the place of occurrence of the external cause: Secondary | ICD-10-CM | POA: Diagnosis not present

## 2017-09-20 DIAGNOSIS — R4182 Altered mental status, unspecified: Secondary | ICD-10-CM | POA: Insufficient documentation

## 2017-09-20 LAB — CBC
HEMATOCRIT: 40 % (ref 39.0–52.0)
Hemoglobin: 12.9 g/dL — ABNORMAL LOW (ref 13.0–17.0)
MCH: 26.4 pg (ref 26.0–34.0)
MCHC: 32.3 g/dL (ref 30.0–36.0)
MCV: 81.8 fL (ref 78.0–100.0)
Platelets: 329 10*3/uL (ref 150–400)
RBC: 4.89 MIL/uL (ref 4.22–5.81)
RDW: 14.1 % (ref 11.5–15.5)
WBC: 10.7 10*3/uL — AB (ref 4.0–10.5)

## 2017-09-20 LAB — COMPREHENSIVE METABOLIC PANEL
ALT: 25 U/L (ref 17–63)
AST: 49 U/L — AB (ref 15–41)
Albumin: 3.8 g/dL (ref 3.5–5.0)
Alkaline Phosphatase: 82 U/L (ref 38–126)
Anion gap: 12 (ref 5–15)
BUN: 8 mg/dL (ref 6–20)
CHLORIDE: 102 mmol/L (ref 101–111)
CO2: 27 mmol/L (ref 22–32)
Calcium: 10.1 mg/dL (ref 8.9–10.3)
Creatinine, Ser: 0.72 mg/dL (ref 0.61–1.24)
Glucose, Bld: 116 mg/dL — ABNORMAL HIGH (ref 65–99)
POTASSIUM: 3.6 mmol/L (ref 3.5–5.1)
SODIUM: 141 mmol/L (ref 135–145)
Total Bilirubin: 0.9 mg/dL (ref 0.3–1.2)
Total Protein: 8 g/dL (ref 6.5–8.1)

## 2017-09-20 LAB — LIPASE, BLOOD: LIPASE: 32 U/L (ref 11–51)

## 2017-09-20 NOTE — ED Triage Notes (Signed)
Patient is from home and transported via [facility]. Per EMS, patient has had 2 falls in the last 3 days. He has a cast to the left foot that he slipped on. He is complaining of left arm pain, around the elbow. Denies any LOC. Also, has had diarrhea 3 times today.

## 2017-09-21 ENCOUNTER — Emergency Department (HOSPITAL_COMMUNITY): Payer: Medicare HMO

## 2017-09-21 ENCOUNTER — Emergency Department (HOSPITAL_COMMUNITY)
Admission: EM | Admit: 2017-09-21 | Discharge: 2017-09-21 | Disposition: A | Discharge status: Home / Self Care | Payer: Medicare HMO | Attending: Emergency Medicine | Admitting: Emergency Medicine

## 2017-09-21 DIAGNOSIS — S42452A Displaced fracture of lateral condyle of left humerus, initial encounter for closed fracture: Secondary | ICD-10-CM

## 2017-09-21 DIAGNOSIS — W19XXXA Unspecified fall, initial encounter: Secondary | ICD-10-CM

## 2017-09-21 LAB — PHENYTOIN LEVEL, TOTAL

## 2017-09-21 MED ORDER — PHENYTOIN SODIUM EXTENDED 100 MG PO CAPS
400.0000 mg | ORAL_CAPSULE | Freq: Once | ORAL | Status: AC
  Administered 2017-09-21: 400 mg via ORAL
  Filled 2017-09-21: qty 4

## 2017-09-21 MED ORDER — PHENYTOIN SODIUM EXTENDED 100 MG PO CAPS
300.0000 mg | ORAL_CAPSULE | ORAL | Status: DC
Start: 2017-09-21 — End: 2017-09-21

## 2017-09-21 NOTE — Progress Notes (Signed)
MEDICATION RELATED CONSULT NOTE - INITIAL   Pharmacy Consult for Phenytoin Indication: Seizures  Allergies  Allergen Reactions  . Shellfish Allergy Rash    Patient Measurements: Height: 6\' 1"  (185.4 cm) Weight: 150 lb (68 kg) IBW/kg (Calculated) : 79.9   Vital Signs: Temp: 97.9 F (36.6 C) (03/04 2049) Temp Source: Oral (03/04 2049) BP: 118/76 (03/05 0617) Pulse Rate: 54 (03/05 0617) Intake/Output from previous day: No intake/output data recorded. Intake/Output from this shift: No intake/output data recorded.  Labs: Recent Labs    09/20/17 2125  WBC 10.7*  HGB 12.9*  HCT 40.0  PLT 329  CREATININE 0.72  ALBUMIN 3.8  PROT 8.0  AST 49*  ALT 25  ALKPHOS 82  BILITOT 0.9   Estimated Creatinine Clearance: 85 mL/min (by C-G formula based on SCr of 0.72 mg/dL).   Microbiology: No results found for this or any previous visit (from the past 720 hour(s)).  Medical History: Past Medical History:  Diagnosis Date  . Diabetes mellitus without complication ([place])    Type II  . Diastolic dysfunction    a. ECHO 07/2013 EF 55-60% and grade 1 diastolic dysfunction  . NSTEMI (non-ST elevated myocardial infarction) ([place])    problem list 01/2014  . Rhabdomyolysis 01/2014  . Seizures ([place])   . Syncope 01/2014  . Traumatic brain injury [facility])       Assessment: 32 yoM on Dilantin for seizures compliance unclear. Asked by ED physician to give oral load of Dilantin.  Goal of Therapy:  Prevent seizures  Plan:  Give 15 mg/kg of Phenytoin (~ 1 Gm) Give 400 mg x1 then 300 mg q2h x 2 doses for total dose = 1 Gm    Journii Nierman R 09/21/2017,6:50 AM

## 2017-09-21 NOTE — ED Provider Notes (Signed)
[facility] DEPT Provider Note   CSN: [id number] Arrival date & time: 09/20/17  2040     History   Chief Complaint Chief Complaint  Patient presents with  . Arm Pain  . Diarrhea    HPI Wyatt Salas is a 67 y.o. male.  Patient presents with left arm pain after fall.  He is wheelchair-bound due to "[facility] spotted fever in the 1970s" and slipped while he was trying to get from his bed to his wheelchair.  He landed on his left arm and sustained pain to his left elbow wrist and forearm.  Denies hitting head or losing consciousness.  States he tripped over the splint that is on his left ankle.  He was seen in February 25 and diagnosed with a possible chip fracture of his left ankle and placed in a short leg splint.  He denies any dizzy spell or passing out.  No chest pain or shortness of breath.  Denies any blood thinner use.  States he had 3 loose bowel movements earlier today.  No vomiting or abdominal pain.   The history is provided by the patient and the EMS personnel.  Arm Pain  Pertinent negatives include no chest pain, no abdominal pain, no headaches and no shortness of breath.  Diarrhea   Associated symptoms include arthralgias and myalgias. Pertinent negatives include no abdominal pain, no vomiting, no headaches and no cough.    Past Medical History:  Diagnosis Date  . Diabetes mellitus without complication ([place])    Type II  . Diastolic dysfunction    a. ECHO 07/2013 EF 55-60% and grade 1 diastolic dysfunction  . NSTEMI (non-ST elevated myocardial infarction) ([place])    problem list 01/2014  . Rhabdomyolysis 01/2014  . Seizures ([place])   . Syncope 01/2014  . Traumatic brain injury [facility])     Patient Active Problem List   Diagnosis Date Noted  . Abnormal liver function   . Lung mass   . Essential hypertension   . Altered mental status 05/18/2017  . Right sided weakness 10/14/2016  . Acute urinary tract infection 10/14/2016  .  Elevated troponin 10/14/2016  . Type 2 diabetes mellitus with hyperlipidemia ([place]) 10/14/2016  . Fall at home--mechanical 01/26/2014  . Elevated CK 01/26/2014  . Seizure disorder ([place]) 07/26/2013  . NSTEMI (non-ST elevated myocardial infarction) ([place]) 07/26/2013  . Hyperlipidemia 07/26/2013  . Aspiration pneumonia ([place]) 07/26/2013  . Rhabdomyolysis 07/26/2013    Past Surgical History:  Procedure Laterality Date  . CRANIOTOMY         Home Medications    Prior to Admission medications   Medication Sig Start Date End Date Taking? Authorizing Provider  acetaminophen (TYLENOL) 500 MG tablet Take 1,000 mg by mouth every 12 (twelve) hours as needed for mild pain or fever.    [provider]  carboxymethylcellulose (REFRESH PLUS) 0.5 % SOLN Place 1 drop into both eyes 2 (two) times daily as needed (Dry eyes).    [provider]  docusate sodium (COLACE) 100 MG capsule Take 200 mg by mouth 2 (two) times daily.    [provider]  ENSURE PLUS (ENSURE PLUS) LIQD Take 237 mLs by mouth 2 (two) times daily between meals.    [provider]  folic acid (FOLVITE) 1 MG tablet Take 1 tablet (1 mg total) daily by mouth. 05/25/17   Barton Dubois, MD  galantamine (RAZADYNE) 8 MG tablet Take 1 tablet (8 mg total) by mouth 2 (two) times  daily with a meal. 07/31/13   [name], MD  HYDROcodone-acetaminophen (NORCO) 5-325 MG tablet Take 1 tablet by mouth every 6 (six) hours as needed for severe pain. 09/13/17   Julianne Rice, MD  lacosamide (VIMPAT) 50 MG TABS tablet Take 100 mg by mouth 2 (two) times daily.    [provider]  levETIRAcetam (KEPPRA) 750 MG tablet Take 1,500 mg by mouth 2 (two) times daily.    [provider]  lisinopril (PRINIVIL,ZESTRIL) 5 MG tablet Take 5 mg by mouth daily.    [provider]  metFORMIN (GLUCOPHAGE) 500 MG tablet Take 500 mg by mouth daily with breakfast.     [provider]  Multiple Vitamin  (MULTIVITAMIN WITH MINERALS) TABS tablet Take 1 tablet by mouth daily.    [provider]  phenytoin (DILANTIN) 30 MG ER capsule Take 1 capsule (30 mg total) by mouth 2 (two) times daily. Patient taking differently: Take 30 mg daily by mouth.  10/15/16 05/27/18  Arrien, [name], MD  simvastatin (ZOCOR) 80 MG tablet Take 80 mg at bedtime by mouth.     [provider]  thiamine 100 MG tablet Take 1 tablet (100 mg total) daily by mouth. 05/25/17   Barton Dubois, MD    Family History Family History  Problem Relation Age of Onset  . Headache Mother   . Diabetes Neg Hx   . Stroke Neg Hx     Social History Social History   Tobacco Use  . Smoking status: Former Smoker    Types: Cigars  . Smokeless tobacco: Never Used  Substance Use Topics  . Alcohol use: No    Frequency: Never  . Drug use: Yes    Types: Marijuana     Allergies   Shellfish allergy   Review of Systems Review of Systems  Constitutional: Negative for appetite change, fatigue and fever.  HENT: Negative for congestion and rhinorrhea.   Respiratory: Negative for cough, chest tightness and shortness of breath.   Cardiovascular: Negative for chest pain.  Gastrointestinal: Positive for diarrhea. Negative for abdominal pain, nausea and vomiting.  Genitourinary: Negative for dysuria, hematuria and testicular pain.  Musculoskeletal: Positive for arthralgias and myalgias.  Skin: Negative for rash.  Neurological: Negative for dizziness, weakness and headaches.    all other systems are negative except as noted in the HPI and PMH.    Physical Exam Updated Vital Signs BP 113/86 (BP Location: Right Wrist)   Pulse (!) 109   Temp 97.9 F (36.6 C) (Oral)   Resp 16   Ht 6\' 1"  (1.854 m)   Wt 68 kg (150 lb)   SpO2 100%   BMI 19.79 kg/m   Physical Exam  Constitutional: He is oriented to person, place, and time. He appears well-developed and well-nourished. No distress.  HENT:  Head:  Normocephalic and atraumatic.  Mouth/Throat: Oropharynx is clear and moist. No oropharyngeal exudate.  Eyes: Conjunctivae and EOM are normal. Pupils are equal, round, and reactive to light.  Neck: Normal range of motion. Neck supple.  Paraspinal C spine tenderness  Cardiovascular: Normal rate, regular rhythm, normal heart sounds and intact distal pulses.  No murmur heard. Pulmonary/Chest: Effort normal and breath sounds normal. No respiratory distress. He exhibits no tenderness.  Abdominal: Soft. There is no tenderness. There is no rebound and no guarding.  Musculoskeletal: Normal range of motion. He exhibits edema and tenderness.  There is pain to palpation of the left elbow and olecranon with mild swelling.  There  is an abrasion but no open fracture.  Pain with range of motion of left elbow pain to palpation across left forearm and wrist.  Intact radial pulse.  Swelling of left ankle with tenderness across medial malleolus and lateral dorsal foot.  Intact DP pulse.  Compartments are soft.  Full range of motion of left hip and knee without pain  Neurological: He is alert and oriented to person, place, and time. No cranial nerve deficit. He exhibits normal muscle tone. Coordination normal.  No ataxia on finger to nose bilaterally. No pronator drift. 5/5 strength throughout. CN 2-12 intact.Equal grip strength. Sensation intact.   Skin: Skin is warm. Capillary refill takes less than 2 seconds. No rash noted.  Psychiatric: He has a normal mood and affect. His behavior is normal.  Nursing note and vitals reviewed.    ED Treatments / Results  Labs (all labs ordered are listed, but only abnormal results are displayed) Labs Reviewed  COMPREHENSIVE METABOLIC PANEL - Abnormal; Notable for the following components:      Result Value   Glucose, Bld 116 (*)    AST 49 (*)    All other components within normal limits  CBC - Abnormal; Notable for the following components:   WBC 10.7 (*)     Hemoglobin 12.9 (*)    All other components within normal limits  LIPASE, BLOOD  URINALYSIS, ROUTINE W REFLEX MICROSCOPIC  PHENYTOIN LEVEL, TOTAL    EKG  EKG Interpretation  Date/Time:  Tuesday September 21 2017 06:16:30 EST Ventricular Rate:  85 PR Interval:    QRS Duration: 98 QT Interval:  381 QTC Calculation: 453 R Axis:   85 Text Interpretation:  Sinus rhythm Multiform ventricular premature complexes Borderline short PR interval Borderline right axis deviation Borderline repolarization abnormality Borderline ST elevation, anterior leads Nonspecific ST and T wave abnormality Confirmed by Ezequiel Essex [phone]) on 09/21/2017 6:22:01 AM Also confirmed by Ezequiel Essex [phone]), editor Oswaldo Milian, [place] ([id number])  on 09/21/2017 7:11:57 AM       Radiology Dg Elbow Complete Left  Result Date: 09/20/2017 CLINICAL DATA:  Left elbow and forearm pain after 2 falls in the past 3 days. EXAM: LEFT ELBOW - COMPLETE 3+ VIEW COMPARISON:  Elbow radiographs 11/17/2009 FINDINGS: Cortical step-off of the lateral humeral articular surface involving the capitellum consistent with minimally displaced fracture. There is an associated joint effusion. Tiny soft tissue calcifications laterally are unchanged from prior exam. IMPRESSION: Intra-articular distal humerus fracture involving the capitellum, minimally displaced, with associated joint effusion. Electronically Signed   By: Jeb Levering M.D.   On: 09/20/2017 21:28   Dg Forearm Left  Result Date: 09/20/2017 CLINICAL DATA:  Left elbow and forearm pain after 2 falls in the past 3 days. EXAM: LEFT FOREARM - 2 VIEW COMPARISON:  Left forearm radiograph 11/17/2009. Elbow radiographs performed concurrently. FINDINGS: Elbow joint effusion and cortical step-off of the lateral humeral articular surface, better assessed on concurrent elbow exam. No fracture of the radius or ulna. Wrist alignment is maintained. IMPRESSION: No forearm fracture. Electronically Signed    By: Jeb Levering M.D.   On: 09/20/2017 21:26    Procedures Procedures (including critical care time)  Medications Ordered in ED Medications - No data to display   Initial Impression / Assessment and Plan / ED Course  I have reviewed the triage vital signs and the nursing notes.  Pertinent labs & imaging results that were available during my care of the patient were reviewed by me and considered in my  medical decision making (see chart for details).    Patient with mechanical fall today injuring left elbow and wrist.  Previous injury of left ankle.  Denies hitting head or losing consciousness.  X-ray results of distal humerus fracture discussed with Dr. Mardelle Matte orthopedics.  He requests CT of elbow as well as long-arm splint.  X-ray of ankle today does not appear to show any acute fracture.  There is healing of patient's previous tibial and fibular fractures.  LLE spling replaced with cam walker. LUE splinted.  Patient to follow up with Dr. Mardelle Matte. Dilantin level undetectable and loading dose given. Return precautions discussed. Final Clinical Impressions(s) / ED Diagnoses   Final diagnoses:  Closed fracture of capitellum of distal humerus, left, initial encounter  Fall, initial encounter    ED Discharge Orders    None        Jon, Annie Main, MD 09/21/17 [phone]

## 2017-09-21 NOTE — ED Notes (Signed)
Patient transported to CT 

## 2017-09-21 NOTE — Discharge Instructions (Addendum)
Follow up with Dr. Mardelle Matte. Wear the splint until you see him in the office. Return to the ED if you develop new or worsening symptoms.

## 2017-09-21 NOTE — ED Notes (Signed)
[facility] notified of need for transport. 

## 2017-10-05 ENCOUNTER — Ambulatory Visit (HOSPITAL_COMMUNITY)
Admission: EM | Admit: 2017-10-05 | Discharge: 2017-10-05 | Disposition: A | Discharge status: Home / Self Care | Payer: Medicare HMO | Attending: Family Medicine | Admitting: Family Medicine

## 2017-10-05 ENCOUNTER — Encounter (HOSPITAL_COMMUNITY): Payer: Self-pay | Admitting: Emergency Medicine

## 2017-10-05 DIAGNOSIS — S42452A Displaced fracture of lateral condyle of left humerus, initial encounter for closed fracture: Secondary | ICD-10-CM

## 2017-10-05 DIAGNOSIS — W19XXXA Unspecified fall, initial encounter: Secondary | ICD-10-CM

## 2017-10-05 DIAGNOSIS — S42452S Displaced fracture of lateral condyle of left humerus, sequela: Secondary | ICD-10-CM

## 2017-10-05 MED ORDER — ACETAMINOPHEN 500 MG PO TABS: 500.0000 mg | ORAL_TABLET | Freq: Four times a day (QID) | ORAL | 0 refills | Status: AC | PRN

## 2017-10-05 NOTE — Discharge Instructions (Signed)
Please call today to Dr. Luanna Cole office for appointment in the next 1-2 days for further management and treatment of your left arm.  May continue to use tylenol as needed for pain. Elevation of arm as able to help with pain.

## 2017-10-05 NOTE — ED Provider Notes (Signed)
[place]    CSN: [id number] Arrival date & time: 10/05/17  1029     History   Chief Complaint Chief Complaint  Patient presents with  . Follow-up    HPI Wyatt Salas is a 67 y.o. male.   Pershing presents with complaints of persistent left elbow pain after a fall 3/4. He is wheelchair bound and fell during a transfer from his wheelchair. He was placed in a long arm splint in the ED with instruction to follow up with orthopedics in two days. He has not yet seen orthopedics, states he did not call them. He receives transportation [facility] the [place]. [facility] for [facility]. Taking tylenol for pain which has minimally helped. Rates pain 8/10. Hx DM, NSTEMI, seizures, TBI.     Result Date: 09/20/2017 CLINICAL DATA:  Left elbow and forearm pain after 2 falls in the past 3 days. EXAM: LEFT ELBOW - COMPLETE 3+ VIEW COMPARISON:  Elbow radiographs 11/17/2009 FINDINGS: Cortical step-off of the lateral humeral articular surface involving the capitellum consistent with minimally displaced fracture. There is an associated joint effusion. Tiny soft tissue calcifications laterally are unchanged from prior exam. IMPRESSION: Intra-articular distal humerus fracture involving the capitellum, minimally displaced, with associated joint effusion. Electronically Signed   By: Jeb Levering M.D.   On: 09/20/2017 21:28       Past Medical History:  Diagnosis Date  . Diabetes mellitus without complication ([place])    Type II  . Diastolic dysfunction    a. ECHO 07/2013 EF 55-60% and grade 1 diastolic dysfunction  . NSTEMI (non-ST elevated myocardial infarction) ([place])    problem list 01/2014  . Rhabdomyolysis 01/2014  . Seizures ([employer])   . Syncope 01/2014  . Traumatic brain injury [facility])     Patient Active Problem List   Diagnosis Date Noted  . Abnormal liver function   . Lung mass   . Essential hypertension   . Altered mental status 05/18/2017  .  Right sided weakness 10/14/2016  . Acute urinary tract infection 10/14/2016  . Elevated troponin 10/14/2016  . Type 2 diabetes mellitus with hyperlipidemia ([place]) 10/14/2016  . Fall at home--mechanical 01/26/2014  . Elevated CK 01/26/2014  . Seizure disorder ([place]) 07/26/2013  . NSTEMI (non-ST elevated myocardial infarction) ([place]) 07/26/2013  . Hyperlipidemia 07/26/2013  . Aspiration pneumonia ([place]) 07/26/2013  . Rhabdomyolysis 07/26/2013    Past Surgical History:  Procedure Laterality Date  . CRANIOTOMY         Home Medications    Prior to Admission medications   Medication Sig Start Date End Date Taking? Authorizing Provider  acetaminophen (TYLENOL) 500 MG tablet Take 1 tablet (500 mg total) by mouth every 6 (six) hours as needed. 10/05/17   Zigmund Gottron, NP  carboxymethylcellulose (REFRESH PLUS) 0.5 % SOLN Place 1 drop into both eyes 2 (two) times daily as needed (Dry eyes).    [provider]  docusate sodium (COLACE) 100 MG capsule Take 200 mg by mouth 2 (two) times daily.    [provider]  ENSURE PLUS (ENSURE PLUS) LIQD Take 237 mLs by mouth 2 (two) times daily between meals.    [provider]  folic acid (FOLVITE) 1 MG tablet Take 1 tablet (1 mg total) daily by mouth. 05/25/17   Barton Dubois, MD  galantamine (RAZADYNE) 8 MG tablet Take 1 tablet (8 mg total) by mouth 2 (two) times daily with a meal. 07/31/13  [name], MD  HYDROcodone-acetaminophen (NORCO) 5-325 MG tablet Take 1 tablet by mouth every 6 (six) hours as needed for severe pain. 09/13/17   Julianne Rice, MD  lacosamide (VIMPAT) 50 MG TABS tablet Take 100 mg by mouth 2 (two) times daily.    [provider]  levETIRAcetam (KEPPRA) 750 MG tablet Take 1,500 mg by mouth 2 (two) times daily.    [provider]  lisinopril (PRINIVIL,ZESTRIL) 5 MG tablet Take 5 mg by mouth daily.    [provider]  metFORMIN (GLUCOPHAGE) 500 MG tablet Take 500 mg by mouth  daily with breakfast.     [provider]  Multiple Vitamin (MULTIVITAMIN WITH MINERALS) TABS tablet Take 1 tablet by mouth daily.    [provider]  phenytoin (DILANTIN) 30 MG ER capsule Take 1 capsule (30 mg total) by mouth 2 (two) times daily. Patient taking differently: Take 30 mg daily by mouth.  10/15/16 05/27/18  Arrien, [name], MD  simvastatin (ZOCOR) 80 MG tablet Take 80 mg at bedtime by mouth.     [provider]  thiamine 100 MG tablet Take 1 tablet (100 mg total) daily by mouth. 05/25/17   Barton Dubois, MD    Family History Family History  Problem Relation Age of Onset  . Headache Mother   . Diabetes Neg Hx   . Stroke Neg Hx     Social History Social History   Tobacco Use  . Smoking status: Former Smoker    Types: Cigars  . Smokeless tobacco: Never Used  Substance Use Topics  . Alcohol use: No    Frequency: Never  . Drug use: Yes    Types: Marijuana     Allergies   Shellfish allergy   Review of Systems Review of Systems   Physical Exam Triage Vital Signs ED Triage Vitals [10/05/17 1127]  Enc Vitals Group     BP 140/81     Pulse Rate 75     Resp 18     Temp 98.4 F (36.9 C)     Temp src      SpO2 99 %     Weight      Height      Head Circumference      Peak Flow      Pain Score      Pain Loc      Pain Edu?      Excl. in [place]?    No data found.  Updated Vital Signs BP 140/81   Pulse 75   Temp 98.4 F (36.9 C)   Resp 18   SpO2 99%   Visual Acuity Right Eye Distance:   Left Eye Distance:   Bilateral Distance:    Right Eye Near:   Left Eye Near:    Bilateral Near:     Physical Exam  Constitutional: He is oriented to person, place, and time. He appears well-developed and well-nourished.  Cardiovascular: Normal rate and regular rhythm.  Pulmonary/Chest: Effort normal and breath sounds normal.  Musculoskeletal:  Left arm in long arm splint; left fingers without edema, sensation intact and cap  refill <2 seconds   Neurological: He is alert and oriented to person, place, and time.  Skin: Skin is warm and dry.     UC Treatments / Results  Labs (all labs ordered are listed, but only abnormal results are displayed) Labs Reviewed - No data to display  EKG  EKG Interpretation None       Radiology  No results found.  Procedures Procedures (including critical care time)  Medications Ordered in UC Medications - No data to display   Initial Impression / Assessment and Plan / UC Course  I have reviewed the triage vital signs and the nursing notes.  Pertinent labs & imaging results that were available during my care of the patient were reviewed by me and considered in my medical decision making (see chart for details).     Ct and xray obtained 3/5 per surgeon request, patient did not make follow up appointment. Left arm splint in place at this time. Tylenol as needed for pain. Patient ensures he has transportation for follow up. Information reiterated to make appointment with orthopedists for definitive treatment, highlighted discharge papers for patient. History and physical consistent with viral illness.    Final Clinical Impressions(s) / UC Diagnoses   Final diagnoses:  Closed fracture of capitulum of left humerus, sequela    ED Discharge Orders        Ordered    acetaminophen (TYLENOL) 500 MG tablet  Every 6 hours PRN     10/05/17 1203       Controlled Substance Prescriptions [place] Controlled Substance Registry consulted? Not Applicable   Zigmund Gottron, NP 10/05/17 1216

## 2017-10-05 NOTE — ED Triage Notes (Signed)
Pt went to the [place], states he had some breaks in his L arm after a fall and a chip in his L foot. Pt was told to come here for follow up. Pt c/o ongoing pain in his L arm and L foot. L arm has splint applied.

## 2017-10-26 ENCOUNTER — Emergency Department (HOSPITAL_COMMUNITY): Payer: Medicare HMO

## 2017-10-26 ENCOUNTER — Inpatient Hospital Stay (HOSPITAL_COMMUNITY)
Admission: EM | Admit: 2017-10-26 | Discharge: 2017-12-18 | DRG: 264 | Disposition: E | Payer: Medicare HMO | Attending: Internal Medicine | Admitting: Internal Medicine

## 2017-10-26 ENCOUNTER — Encounter (HOSPITAL_COMMUNITY): Payer: Self-pay | Admitting: *Deleted

## 2017-10-26 DIAGNOSIS — R339 Retention of urine, unspecified: Secondary | ICD-10-CM

## 2017-10-26 DIAGNOSIS — I4891 Unspecified atrial fibrillation: Secondary | ICD-10-CM | POA: Diagnosis not present

## 2017-10-26 DIAGNOSIS — E876 Hypokalemia: Secondary | ICD-10-CM | POA: Diagnosis not present

## 2017-10-26 DIAGNOSIS — E43 Unspecified severe protein-calorie malnutrition: Secondary | ICD-10-CM

## 2017-10-26 DIAGNOSIS — J910 Malignant pleural effusion: Secondary | ICD-10-CM | POA: Diagnosis not present

## 2017-10-26 DIAGNOSIS — I493 Ventricular premature depolarization: Secondary | ICD-10-CM | POA: Diagnosis present

## 2017-10-26 DIAGNOSIS — M79672 Pain in left foot: Secondary | ICD-10-CM | POA: Diagnosis present

## 2017-10-26 DIAGNOSIS — R918 Other nonspecific abnormal finding of lung field: Secondary | ICD-10-CM | POA: Diagnosis present

## 2017-10-26 DIAGNOSIS — R059 Cough, unspecified: Secondary | ICD-10-CM

## 2017-10-26 DIAGNOSIS — R05 Cough: Secondary | ICD-10-CM

## 2017-10-26 DIAGNOSIS — W050XXA Fall from non-moving wheelchair, initial encounter: Secondary | ICD-10-CM | POA: Diagnosis present

## 2017-10-26 DIAGNOSIS — E785 Hyperlipidemia, unspecified: Secondary | ICD-10-CM | POA: Diagnosis present

## 2017-10-26 DIAGNOSIS — W19XXXA Unspecified fall, initial encounter: Secondary | ICD-10-CM

## 2017-10-26 DIAGNOSIS — I313 Pericardial effusion (noninflammatory): Secondary | ICD-10-CM | POA: Diagnosis not present

## 2017-10-26 DIAGNOSIS — C801 Malignant (primary) neoplasm, unspecified: Secondary | ICD-10-CM

## 2017-10-26 DIAGNOSIS — R1311 Dysphagia, oral phase: Secondary | ICD-10-CM | POA: Diagnosis present

## 2017-10-26 DIAGNOSIS — M21332 Wrist drop, left wrist: Secondary | ICD-10-CM | POA: Diagnosis present

## 2017-10-26 DIAGNOSIS — M25512 Pain in left shoulder: Secondary | ICD-10-CM | POA: Diagnosis present

## 2017-10-26 DIAGNOSIS — R54 Age-related physical debility: Secondary | ICD-10-CM | POA: Diagnosis present

## 2017-10-26 DIAGNOSIS — I3131 Malignant pericardial effusion in diseases classified elsewhere: Secondary | ICD-10-CM

## 2017-10-26 DIAGNOSIS — R008 Other abnormalities of heart beat: Secondary | ICD-10-CM | POA: Diagnosis present

## 2017-10-26 DIAGNOSIS — R338 Other retention of urine: Secondary | ICD-10-CM | POA: Diagnosis present

## 2017-10-26 DIAGNOSIS — J9601 Acute respiratory failure with hypoxia: Secondary | ICD-10-CM | POA: Diagnosis not present

## 2017-10-26 DIAGNOSIS — Z91013 Allergy to seafood: Secondary | ICD-10-CM

## 2017-10-26 DIAGNOSIS — I110 Hypertensive heart disease with heart failure: Secondary | ICD-10-CM | POA: Diagnosis present

## 2017-10-26 DIAGNOSIS — I481 Persistent atrial fibrillation: Secondary | ICD-10-CM | POA: Diagnosis present

## 2017-10-26 DIAGNOSIS — N401 Enlarged prostate with lower urinary tract symptoms: Secondary | ICD-10-CM | POA: Diagnosis present

## 2017-10-26 DIAGNOSIS — S42402A Unspecified fracture of lower end of left humerus, initial encounter for closed fracture: Secondary | ICD-10-CM | POA: Diagnosis present

## 2017-10-26 DIAGNOSIS — J90 Pleural effusion, not elsewhere classified: Secondary | ICD-10-CM

## 2017-10-26 DIAGNOSIS — I5041 Acute combined systolic (congestive) and diastolic (congestive) heart failure: Secondary | ICD-10-CM

## 2017-10-26 DIAGNOSIS — Z79899 Other long term (current) drug therapy: Secondary | ICD-10-CM

## 2017-10-26 DIAGNOSIS — I472 Ventricular tachycardia: Secondary | ICD-10-CM | POA: Diagnosis present

## 2017-10-26 DIAGNOSIS — B182 Chronic viral hepatitis C: Secondary | ICD-10-CM | POA: Diagnosis present

## 2017-10-26 DIAGNOSIS — Z9119 Patient's noncompliance with other medical treatment and regimen: Secondary | ICD-10-CM

## 2017-10-26 DIAGNOSIS — I10 Essential (primary) hypertension: Secondary | ICD-10-CM | POA: Diagnosis present

## 2017-10-26 DIAGNOSIS — F1011 Alcohol abuse, in remission: Secondary | ICD-10-CM | POA: Diagnosis present

## 2017-10-26 DIAGNOSIS — T148XXA Other injury of unspecified body region, initial encounter: Secondary | ICD-10-CM

## 2017-10-26 DIAGNOSIS — D649 Anemia, unspecified: Secondary | ICD-10-CM | POA: Diagnosis present

## 2017-10-26 DIAGNOSIS — R296 Repeated falls: Secondary | ICD-10-CM | POA: Diagnosis present

## 2017-10-26 DIAGNOSIS — R0600 Dyspnea, unspecified: Secondary | ICD-10-CM

## 2017-10-26 DIAGNOSIS — R52 Pain, unspecified: Secondary | ICD-10-CM

## 2017-10-26 DIAGNOSIS — I314 Cardiac tamponade: Secondary | ICD-10-CM

## 2017-10-26 DIAGNOSIS — R0689 Other abnormalities of breathing: Secondary | ICD-10-CM

## 2017-10-26 DIAGNOSIS — E1169 Type 2 diabetes mellitus with other specified complication: Secondary | ICD-10-CM | POA: Diagnosis present

## 2017-10-26 DIAGNOSIS — Z9889 Other specified postprocedural states: Secondary | ICD-10-CM

## 2017-10-26 DIAGNOSIS — I959 Hypotension, unspecified: Secondary | ICD-10-CM | POA: Diagnosis not present

## 2017-10-26 DIAGNOSIS — Z7984 Long term (current) use of oral hypoglycemic drugs: Secondary | ICD-10-CM

## 2017-10-26 DIAGNOSIS — F329 Major depressive disorder, single episode, unspecified: Secondary | ICD-10-CM | POA: Diagnosis present

## 2017-10-26 DIAGNOSIS — I252 Old myocardial infarction: Secondary | ICD-10-CM

## 2017-10-26 DIAGNOSIS — G9389 Other specified disorders of brain: Secondary | ICD-10-CM | POA: Diagnosis present

## 2017-10-26 DIAGNOSIS — Z515 Encounter for palliative care: Secondary | ICD-10-CM

## 2017-10-26 DIAGNOSIS — I2510 Atherosclerotic heart disease of native coronary artery without angina pectoris: Secondary | ICD-10-CM | POA: Diagnosis present

## 2017-10-26 DIAGNOSIS — E861 Hypovolemia: Secondary | ICD-10-CM | POA: Diagnosis present

## 2017-10-26 DIAGNOSIS — Z66 Do not resuscitate: Secondary | ICD-10-CM | POA: Diagnosis present

## 2017-10-26 DIAGNOSIS — Z993 Dependence on wheelchair: Secondary | ICD-10-CM

## 2017-10-26 DIAGNOSIS — I5043 Acute on chronic combined systolic (congestive) and diastolic (congestive) heart failure: Secondary | ICD-10-CM | POA: Diagnosis present

## 2017-10-26 DIAGNOSIS — M25572 Pain in left ankle and joints of left foot: Secondary | ICD-10-CM | POA: Diagnosis not present

## 2017-10-26 DIAGNOSIS — R64 Cachexia: Secondary | ICD-10-CM | POA: Diagnosis present

## 2017-10-26 DIAGNOSIS — I4892 Unspecified atrial flutter: Secondary | ICD-10-CM

## 2017-10-26 DIAGNOSIS — C3411 Malignant neoplasm of upper lobe, right bronchus or lung: Secondary | ICD-10-CM | POA: Diagnosis present

## 2017-10-26 DIAGNOSIS — M19072 Primary osteoarthritis, left ankle and foot: Secondary | ICD-10-CM | POA: Diagnosis present

## 2017-10-26 DIAGNOSIS — E222 Syndrome of inappropriate secretion of antidiuretic hormone: Secondary | ICD-10-CM | POA: Diagnosis not present

## 2017-10-26 DIAGNOSIS — S42302D Unspecified fracture of shaft of humerus, left arm, subsequent encounter for fracture with routine healing: Secondary | ICD-10-CM

## 2017-10-26 DIAGNOSIS — Z681 Body mass index (BMI) 19 or less, adult: Secondary | ICD-10-CM

## 2017-10-26 DIAGNOSIS — I5032 Chronic diastolic (congestive) heart failure: Secondary | ICD-10-CM | POA: Diagnosis present

## 2017-10-26 DIAGNOSIS — Z87891 Personal history of nicotine dependence: Secondary | ICD-10-CM

## 2017-10-26 DIAGNOSIS — R0602 Shortness of breath: Secondary | ICD-10-CM

## 2017-10-26 DIAGNOSIS — G40909 Epilepsy, unspecified, not intractable, without status epilepticus: Secondary | ICD-10-CM

## 2017-10-26 DIAGNOSIS — Z7189 Other specified counseling: Secondary | ICD-10-CM

## 2017-10-26 LAB — COMPREHENSIVE METABOLIC PANEL
ALBUMIN: 3.3 g/dL — AB (ref 3.5–5.0)
ALK PHOS: 81 U/L (ref 38–126)
ALT: 18 U/L (ref 17–63)
AST: 24 U/L (ref 15–41)
Anion gap: 13 (ref 5–15)
BUN: 9 mg/dL (ref 6–20)
CALCIUM: 10 mg/dL (ref 8.9–10.3)
CHLORIDE: 104 mmol/L (ref 101–111)
CO2: 26 mmol/L (ref 22–32)
CREATININE: 0.62 mg/dL (ref 0.61–1.24)
GFR calc non Af Amer: >60 mL/min (ref >60)
GLUCOSE: 132 mg/dL — AB (ref 65–99)
Potassium: 4.1 mmol/L (ref 3.5–5.1)
SODIUM: 143 mmol/L (ref 135–145)
Total Bilirubin: 0.6 mg/dL (ref 0.3–1.2)
Total Protein: 7.8 g/dL (ref 6.5–8.1)

## 2017-10-26 LAB — RAPID URINE DRUG SCREEN, HOSP PERFORMED
AMPHETAMINES: NOT DETECTED
BARBITURATES: NOT DETECTED
Benzodiazepines: NOT DETECTED
Cocaine: NOT DETECTED
OPIATES: NOT DETECTED
TETRAHYDROCANNABINOL: NOT DETECTED

## 2017-10-26 LAB — CBC WITH DIFFERENTIAL/PLATELET
Basophils Absolute: 0 10*3/uL (ref 0.0–0.1)
Basophils Relative: 0 %
EOS PCT: 0 %
Eosinophils Absolute: 0 10*3/uL (ref 0.0–0.7)
HCT: 38 % — ABNORMAL LOW (ref 39.0–52.0)
HEMOGLOBIN: 12 g/dL — AB (ref 13.0–17.0)
LYMPHS ABS: 1.9 10*3/uL (ref 0.7–4.0)
Lymphocytes Relative: 17 %
MCH: 26.5 pg (ref 26.0–34.0)
MCHC: 31.6 g/dL (ref 30.0–36.0)
MCV: 83.9 fL (ref 78.0–100.0)
Monocytes Absolute: 0.9 10*3/uL (ref 0.1–1.0)
Monocytes Relative: 8 %
Neutro Abs: 8.4 10*3/uL — ABNORMAL HIGH (ref 1.7–7.7)
Neutrophils Relative %: 75 %
PLATELETS: 391 10*3/uL (ref 150–400)
RBC: 4.53 MIL/uL (ref 4.22–5.81)
RDW: 15.4 % (ref 11.5–15.5)
WBC: 11.1 10*3/uL — AB (ref 4.0–10.5)

## 2017-10-26 LAB — I-STAT TROPONIN, ED: Troponin i, poc: 0 ng/mL (ref 0.00–0.08)

## 2017-10-26 LAB — LIPASE, BLOOD: Lipase: 26 U/L (ref 11–51)

## 2017-10-26 MED ORDER — SODIUM CHLORIDE 0.9 % IV BOLUS
1000.0000 mL | Freq: Once | INTRAVENOUS | Status: AC
  Administered 2017-10-27: 1000 mL via INTRAVENOUS

## 2017-10-26 MED ORDER — IOPAMIDOL (ISOVUE-300) INJECTION 61%
INTRAVENOUS | Status: AC
  Filled 2017-10-26: qty 100

## 2017-10-26 MED ORDER — SODIUM CHLORIDE 0.9 % IV BOLUS
1000.0000 mL | Freq: Once | INTRAVENOUS | Status: AC
  Administered 2017-10-26: 1000 mL via INTRAVENOUS

## 2017-10-26 MED ORDER — IOPAMIDOL (ISOVUE-300) INJECTION 61%: 100.0000 mL | Freq: Once | INTRAVENOUS | Status: DC | PRN

## 2017-10-26 NOTE — ED Provider Notes (Signed)
MSE was initiated and I personally evaluated the patient and placed orders (if any) at  7:09 PM on October 26, 2017.  The patient appears stable so that the remainder of the MSE may be completed by another provider.  Patient presents today for evaluation of a fall.  He reports that he had a new fall today where he fell out of his wheelchair.  He reports he has since had increased pain in his left arm, head, chest, and belly.  He reports that he hit the left side of his head on the floor.  He reports that he is continuing to have cough and shortness of breath.  Chart review shows he has a history of lung cancer, unclear if he actually received treatment for this or not.  Patient's abdomen is tender to palpation over left lower and lower middle areas.  Patient appears to require higher level of care than can be provided given the resources and flow of the fast track area.  Patient will be moved to the back acute area.   Ollen Gross 11/04/2017 Remonia Richter    Isla Pence, MD 11/01/2017 2159

## 2017-10-26 NOTE — Discharge Instructions (Addendum)
Foley catheter to stay in place until you see the urologist.  You need to make an appointment with the pulmonologists to evaluate your lung mass.

## 2017-10-26 NOTE — ED Triage Notes (Signed)
Per EMS, pt complains of left forehead pain and left shoulder pain since falling out of his wheelchair this morning. Pt states he was trying to get out of wheelchair. Pt has splint to left arm from previous fall. Pt also complains of productive cough for the past week.
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
## 2017-10-26 NOTE — ED Notes (Signed)
Pt going for chest x ray

## 2017-10-27 ENCOUNTER — Encounter (HOSPITAL_COMMUNITY): Payer: Self-pay | Admitting: Family Medicine

## 2017-10-27 ENCOUNTER — Other Ambulatory Visit: Payer: Self-pay

## 2017-10-27 DIAGNOSIS — Z87891 Personal history of nicotine dependence: Secondary | ICD-10-CM | POA: Diagnosis not present

## 2017-10-27 DIAGNOSIS — I5032 Chronic diastolic (congestive) heart failure: Secondary | ICD-10-CM | POA: Diagnosis present

## 2017-10-27 DIAGNOSIS — E785 Hyperlipidemia, unspecified: Secondary | ICD-10-CM | POA: Diagnosis not present

## 2017-10-27 DIAGNOSIS — E1169 Type 2 diabetes mellitus with other specified complication: Secondary | ICD-10-CM | POA: Diagnosis not present

## 2017-10-27 DIAGNOSIS — M79672 Pain in left foot: Secondary | ICD-10-CM | POA: Diagnosis not present

## 2017-10-27 DIAGNOSIS — J910 Malignant pleural effusion: Secondary | ICD-10-CM | POA: Diagnosis not present

## 2017-10-27 DIAGNOSIS — Z91013 Allergy to seafood: Secondary | ICD-10-CM | POA: Diagnosis not present

## 2017-10-27 DIAGNOSIS — R64 Cachexia: Secondary | ICD-10-CM | POA: Diagnosis present

## 2017-10-27 DIAGNOSIS — S42402D Unspecified fracture of lower end of left humerus, subsequent encounter for fracture with routine healing: Secondary | ICD-10-CM

## 2017-10-27 DIAGNOSIS — Z681 Body mass index (BMI) 19 or less, adult: Secondary | ICD-10-CM | POA: Diagnosis not present

## 2017-10-27 DIAGNOSIS — I5041 Acute combined systolic (congestive) and diastolic (congestive) heart failure: Secondary | ICD-10-CM | POA: Diagnosis not present

## 2017-10-27 DIAGNOSIS — R296 Repeated falls: Secondary | ICD-10-CM | POA: Diagnosis present

## 2017-10-27 DIAGNOSIS — R918 Other nonspecific abnormal finding of lung field: Secondary | ICD-10-CM | POA: Diagnosis not present

## 2017-10-27 DIAGNOSIS — I252 Old myocardial infarction: Secondary | ICD-10-CM | POA: Diagnosis not present

## 2017-10-27 DIAGNOSIS — B182 Chronic viral hepatitis C: Secondary | ICD-10-CM | POA: Diagnosis present

## 2017-10-27 DIAGNOSIS — I110 Hypertensive heart disease with heart failure: Secondary | ICD-10-CM | POA: Diagnosis present

## 2017-10-27 DIAGNOSIS — Z87820 Personal history of traumatic brain injury: Secondary | ICD-10-CM | POA: Diagnosis not present

## 2017-10-27 DIAGNOSIS — I5043 Acute on chronic combined systolic (congestive) and diastolic (congestive) heart failure: Secondary | ICD-10-CM | POA: Diagnosis present

## 2017-10-27 DIAGNOSIS — I340 Nonrheumatic mitral (valve) insufficiency: Secondary | ICD-10-CM | POA: Diagnosis not present

## 2017-10-27 DIAGNOSIS — I313 Pericardial effusion (noninflammatory): Secondary | ICD-10-CM | POA: Diagnosis not present

## 2017-10-27 DIAGNOSIS — I4892 Unspecified atrial flutter: Secondary | ICD-10-CM | POA: Diagnosis not present

## 2017-10-27 DIAGNOSIS — C3411 Malignant neoplasm of upper lobe, right bronchus or lung: Secondary | ICD-10-CM | POA: Diagnosis not present

## 2017-10-27 DIAGNOSIS — F1020 Alcohol dependence, uncomplicated: Secondary | ICD-10-CM | POA: Diagnosis not present

## 2017-10-27 DIAGNOSIS — C3490 Malignant neoplasm of unspecified part of unspecified bronchus or lung: Secondary | ICD-10-CM | POA: Diagnosis not present

## 2017-10-27 DIAGNOSIS — J9601 Acute respiratory failure with hypoxia: Secondary | ICD-10-CM | POA: Diagnosis not present

## 2017-10-27 DIAGNOSIS — S42302A Unspecified fracture of shaft of humerus, left arm, initial encounter for closed fracture: Secondary | ICD-10-CM | POA: Diagnosis not present

## 2017-10-27 DIAGNOSIS — W050XXA Fall from non-moving wheelchair, initial encounter: Secondary | ICD-10-CM | POA: Diagnosis present

## 2017-10-27 DIAGNOSIS — R590 Localized enlarged lymph nodes: Secondary | ICD-10-CM | POA: Diagnosis not present

## 2017-10-27 DIAGNOSIS — M25572 Pain in left ankle and joints of left foot: Secondary | ICD-10-CM | POA: Diagnosis not present

## 2017-10-27 DIAGNOSIS — R1310 Dysphagia, unspecified: Secondary | ICD-10-CM | POA: Diagnosis not present

## 2017-10-27 DIAGNOSIS — S42402A Unspecified fracture of lower end of left humerus, initial encounter for closed fracture: Secondary | ICD-10-CM | POA: Diagnosis present

## 2017-10-27 DIAGNOSIS — I4891 Unspecified atrial fibrillation: Principal | ICD-10-CM | POA: Insufficient documentation

## 2017-10-27 DIAGNOSIS — I314 Cardiac tamponade: Secondary | ICD-10-CM | POA: Diagnosis not present

## 2017-10-27 DIAGNOSIS — I318 Other specified diseases of pericardium: Secondary | ICD-10-CM | POA: Diagnosis not present

## 2017-10-27 DIAGNOSIS — E222 Syndrome of inappropriate secretion of antidiuretic hormone: Secondary | ICD-10-CM | POA: Diagnosis not present

## 2017-10-27 DIAGNOSIS — Z7189 Other specified counseling: Secondary | ICD-10-CM | POA: Diagnosis not present

## 2017-10-27 DIAGNOSIS — J90 Pleural effusion, not elsewhere classified: Secondary | ICD-10-CM | POA: Diagnosis not present

## 2017-10-27 DIAGNOSIS — E43 Unspecified severe protein-calorie malnutrition: Secondary | ICD-10-CM | POA: Diagnosis not present

## 2017-10-27 DIAGNOSIS — I10 Essential (primary) hypertension: Secondary | ICD-10-CM | POA: Diagnosis not present

## 2017-10-27 DIAGNOSIS — E119 Type 2 diabetes mellitus without complications: Secondary | ICD-10-CM | POA: Diagnosis not present

## 2017-10-27 DIAGNOSIS — R338 Other retention of urine: Secondary | ICD-10-CM | POA: Diagnosis present

## 2017-10-27 DIAGNOSIS — S42302D Unspecified fracture of shaft of humerus, left arm, subsequent encounter for fracture with routine healing: Secondary | ICD-10-CM | POA: Diagnosis not present

## 2017-10-27 DIAGNOSIS — E876 Hypokalemia: Secondary | ICD-10-CM | POA: Diagnosis not present

## 2017-10-27 DIAGNOSIS — C801 Malignant (primary) neoplasm, unspecified: Secondary | ICD-10-CM | POA: Diagnosis not present

## 2017-10-27 DIAGNOSIS — Z515 Encounter for palliative care: Secondary | ICD-10-CM | POA: Diagnosis not present

## 2017-10-27 DIAGNOSIS — F1011 Alcohol abuse, in remission: Secondary | ICD-10-CM | POA: Diagnosis present

## 2017-10-27 DIAGNOSIS — M25512 Pain in left shoulder: Secondary | ICD-10-CM | POA: Diagnosis present

## 2017-10-27 DIAGNOSIS — G40909 Epilepsy, unspecified, not intractable, without status epilepticus: Secondary | ICD-10-CM | POA: Diagnosis not present

## 2017-10-27 LAB — URINALYSIS, ROUTINE W REFLEX MICROSCOPIC
BILIRUBIN URINE: NEGATIVE
Glucose, UA: NEGATIVE mg/dL
HGB URINE DIPSTICK: NEGATIVE
Ketones, ur: NEGATIVE mg/dL
LEUKOCYTES UA: NEGATIVE
NITRITE: NEGATIVE
PH: 5 (ref 5.0–8.0)
Protein, ur: 30 mg/dL — AB
SPECIFIC GRAVITY, URINE: 1.026 (ref 1.005–1.030)
WBC, UA: NONE SEEN WBC/hpf (ref 0–5)

## 2017-10-27 LAB — GLUCOSE, CAPILLARY
Glucose-Capillary: 131 mg/dL — ABNORMAL HIGH (ref 65–99)
Glucose-Capillary: 171 mg/dL — ABNORMAL HIGH (ref 65–99)

## 2017-10-27 LAB — APTT: APTT: 29 s (ref 24–36)

## 2017-10-27 LAB — EXPECTORATED SPUTUM ASSESSMENT W GRAM STAIN, RFLX TO RESP C

## 2017-10-27 LAB — HEPARIN LEVEL (UNFRACTIONATED)

## 2017-10-27 LAB — PROTIME-INR
INR: 1.11
PROTHROMBIN TIME: 14.2 s (ref 11.4–15.2)

## 2017-10-27 LAB — MAGNESIUM: MAGNESIUM: 1.5 mg/dL — AB (ref 1.7–2.4)

## 2017-10-27 LAB — MRSA PCR SCREENING: MRSA BY PCR: NEGATIVE

## 2017-10-27 LAB — TSH: TSH: 0.962 u[IU]/mL (ref 0.350–4.500)

## 2017-10-27 LAB — EXPECTORATED SPUTUM ASSESSMENT W REFEX TO RESP CULTURE

## 2017-10-27 MED ORDER — LACOSAMIDE 50 MG PO TABS
100.0000 mg | ORAL_TABLET | Freq: Two times a day (BID) | ORAL | Status: DC
  Administered 2017-10-27 – 2017-11-15 (×39): 100 mg via ORAL
  Filled 2017-10-27 (×40): qty 2

## 2017-10-27 MED ORDER — LIP MEDEX EX OINT
TOPICAL_OINTMENT | CUTANEOUS | Status: AC
  Administered 2017-10-27: 1
  Filled 2017-10-27: qty 7

## 2017-10-27 MED ORDER — HEPARIN (PORCINE) IN NACL 100-0.45 UNIT/ML-% IJ SOLN
1250.0000 [IU]/h | INTRAMUSCULAR | Status: DC
  Administered 2017-10-27: 1050 [IU]/h via INTRAVENOUS
  Administered 2017-10-28: 1250 [IU]/h via INTRAVENOUS
  Filled 2017-10-27: qty 250

## 2017-10-27 MED ORDER — AMIODARONE IV BOLUS ONLY 150 MG/100ML
150.0000 mg | Freq: Once | INTRAVENOUS | Status: AC
  Administered 2017-10-27: 150 mg via INTRAVENOUS
  Filled 2017-10-27: qty 100

## 2017-10-27 MED ORDER — ONDANSETRON HCL 4 MG PO TABS: 4.0000 mg | ORAL_TABLET | Freq: Four times a day (QID) | ORAL | Status: DC | PRN

## 2017-10-27 MED ORDER — FOLIC ACID 1 MG PO TABS
1.0000 mg | ORAL_TABLET | Freq: Every day | ORAL | Status: DC
  Administered 2017-10-27 – 2017-11-15 (×20): 1 mg via ORAL
  Filled 2017-10-27 (×21): qty 1

## 2017-10-27 MED ORDER — SODIUM CHLORIDE 0.9% FLUSH
3.0000 mL | Freq: Two times a day (BID) | INTRAVENOUS | Status: DC
  Administered 2017-10-27 – 2017-11-04 (×15): 3 mL via INTRAVENOUS

## 2017-10-27 MED ORDER — SODIUM CHLORIDE 0.9 % IV SOLN
INTRAVENOUS | Status: AC
  Administered 2017-10-27 (×2): via INTRAVENOUS

## 2017-10-27 MED ORDER — METOPROLOL TARTRATE 5 MG/5ML IV SOLN
5.0000 mg | Freq: Once | INTRAVENOUS | Status: AC
Start: 2017-10-27 — End: 2017-10-27
  Administered 2017-10-27: 5 mg via INTRAVENOUS
  Filled 2017-10-27: qty 5

## 2017-10-27 MED ORDER — INSULIN ASPART 100 UNIT/ML ~~LOC~~ SOLN
0.0000 [IU] | Freq: Three times a day (TID) | SUBCUTANEOUS | Status: DC
  Administered 2017-10-27: 1 [IU] via SUBCUTANEOUS
  Administered 2017-10-27: 2 [IU] via SUBCUTANEOUS
  Administered 2017-10-28: 1 [IU] via SUBCUTANEOUS
  Administered 2017-10-28 (×2): 2 [IU] via SUBCUTANEOUS
  Administered 2017-10-29: 1 [IU] via SUBCUTANEOUS
  Administered 2017-10-29: 2 [IU] via SUBCUTANEOUS
  Administered 2017-10-29: 1 [IU] via SUBCUTANEOUS
  Administered 2017-10-30 (×2): 2 [IU] via SUBCUTANEOUS
  Administered 2017-10-30: 1 [IU] via SUBCUTANEOUS
  Administered 2017-10-31 (×2): 2 [IU] via SUBCUTANEOUS
  Administered 2017-10-31: 1 [IU] via SUBCUTANEOUS
  Administered 2017-11-01: 2 [IU] via SUBCUTANEOUS
  Administered 2017-11-01: 1 [IU] via SUBCUTANEOUS
  Administered 2017-11-01 – 2017-11-03 (×4): 2 [IU] via SUBCUTANEOUS
  Administered 2017-11-03: 1 [IU] via SUBCUTANEOUS
  Administered 2017-11-04 (×2): 2 [IU] via SUBCUTANEOUS
  Administered 2017-11-04: 3 [IU] via SUBCUTANEOUS
  Administered 2017-11-05: 2 [IU] via SUBCUTANEOUS
  Administered 2017-11-05: 3 [IU] via SUBCUTANEOUS
  Administered 2017-11-06 – 2017-11-08 (×8): 2 [IU] via SUBCUTANEOUS
  Administered 2017-11-08: 3 [IU] via SUBCUTANEOUS
  Administered 2017-11-09: 2 [IU] via SUBCUTANEOUS
  Administered 2017-11-09: 3 [IU] via SUBCUTANEOUS
  Administered 2017-11-09 – 2017-11-11 (×6): 2 [IU] via SUBCUTANEOUS
  Administered 2017-11-11 – 2017-11-12 (×2): 1 [IU] via SUBCUTANEOUS
  Administered 2017-11-12: 2 [IU] via SUBCUTANEOUS
  Administered 2017-11-13 (×2): 1 [IU] via SUBCUTANEOUS
  Administered 2017-11-13: 2 [IU] via SUBCUTANEOUS
  Administered 2017-11-14: 1 [IU] via SUBCUTANEOUS
  Administered 2017-11-14: 2 [IU] via SUBCUTANEOUS
  Administered 2017-11-14: 5 [IU] via SUBCUTANEOUS
  Administered 2017-11-15: 1 [IU] via SUBCUTANEOUS
  Administered 2017-11-15: 3 [IU] via SUBCUTANEOUS
  Administered 2017-11-15 – 2017-11-16 (×4): 2 [IU] via SUBCUTANEOUS

## 2017-10-27 MED ORDER — ACETAMINOPHEN 650 MG RE SUPP: 650.0000 mg | Freq: Four times a day (QID) | RECTAL | Status: DC | PRN

## 2017-10-27 MED ORDER — METOPROLOL TARTRATE 5 MG/5ML IV SOLN
2.5000 mg | Freq: Once | INTRAVENOUS | Status: AC
  Administered 2017-10-27: 2.5 mg via INTRAVENOUS
  Filled 2017-10-27: qty 5

## 2017-10-27 MED ORDER — ENSURE PLUS PO LIQD: 237.0000 mL | Freq: Two times a day (BID) | ORAL | Status: DC

## 2017-10-27 MED ORDER — ACETAMINOPHEN 325 MG PO TABS
650.0000 mg | ORAL_TABLET | Freq: Four times a day (QID) | ORAL | Status: DC | PRN
  Administered 2017-10-27 – 2017-10-29 (×3): 650 mg via ORAL
  Filled 2017-10-27 (×3): qty 2

## 2017-10-27 MED ORDER — HEPARIN BOLUS VIA INFUSION
3000.0000 [IU] | Freq: Once | INTRAVENOUS | Status: DC
  Filled 2017-10-27: qty 3000

## 2017-10-27 MED ORDER — TAMSULOSIN HCL 0.4 MG PO CAPS
0.4000 mg | ORAL_CAPSULE | Freq: Every day | ORAL | Status: DC
  Administered 2017-10-27 – 2017-11-15 (×20): 0.4 mg via ORAL
  Filled 2017-10-27 (×21): qty 1

## 2017-10-27 MED ORDER — ATORVASTATIN CALCIUM 40 MG PO TABS
40.0000 mg | ORAL_TABLET | Freq: Every day | ORAL | Status: DC
  Administered 2017-10-27 – 2017-11-15 (×20): 40 mg via ORAL
  Filled 2017-10-27 (×20): qty 1

## 2017-10-27 MED ORDER — POLYVINYL ALCOHOL 1.4 % OP SOLN
1.0000 [drp] | Freq: Two times a day (BID) | OPHTHALMIC | Status: DC | PRN
  Filled 2017-10-27: qty 15

## 2017-10-27 MED ORDER — HYDROCODONE-ACETAMINOPHEN 5-325 MG PO TABS
1.0000 | ORAL_TABLET | ORAL | Status: DC | PRN
  Administered 2017-10-28 – 2017-11-01 (×6): 2 via ORAL
  Administered 2017-11-04: 1 via ORAL
  Administered 2017-11-05 – 2017-11-06 (×5): 2 via ORAL
  Administered 2017-11-08 (×2): 1 via ORAL
  Administered 2017-11-08: 2 via ORAL
  Administered 2017-11-10: 1 via ORAL
  Administered 2017-11-11 – 2017-11-12 (×3): 2 via ORAL
  Administered 2017-11-13 – 2017-11-15 (×2): 1 via ORAL
  Administered 2017-11-15: 2 via ORAL
  Filled 2017-10-27 (×3): qty 2
  Filled 2017-10-27: qty 1
  Filled 2017-10-27: qty 2
  Filled 2017-10-27: qty 1
  Filled 2017-10-27 (×2): qty 2
  Filled 2017-10-27 (×2): qty 1
  Filled 2017-10-27: qty 2
  Filled 2017-10-27: qty 1
  Filled 2017-10-27 (×2): qty 2
  Filled 2017-10-27: qty 1
  Filled 2017-10-27 (×5): qty 2
  Filled 2017-10-27: qty 1
  Filled 2017-10-27 (×2): qty 2

## 2017-10-27 MED ORDER — LORAZEPAM 1 MG PO TABS: 1.0000 mg | ORAL_TABLET | Freq: Four times a day (QID) | ORAL | Status: AC | PRN

## 2017-10-27 MED ORDER — HEPARIN (PORCINE) IN NACL 100-0.45 UNIT/ML-% IJ SOLN
850.0000 [IU]/h | INTRAMUSCULAR | Status: DC
  Administered 2017-10-27: 850 [IU]/h via INTRAVENOUS
  Filled 2017-10-27: qty 250

## 2017-10-27 MED ORDER — ENSURE ENLIVE PO LIQD
237.0000 mL | Freq: Two times a day (BID) | ORAL | Status: DC
  Administered 2017-10-27 – 2017-11-15 (×34): 237 mL via ORAL

## 2017-10-27 MED ORDER — ONDANSETRON HCL 4 MG/2ML IJ SOLN: 4.0000 mg | Freq: Four times a day (QID) | INTRAMUSCULAR | Status: DC | PRN

## 2017-10-27 MED ORDER — DILTIAZEM HCL-DEXTROSE 100-5 MG/100ML-% IV SOLN (PREMIX)
5.0000 mg/h | INTRAVENOUS | Status: DC
  Administered 2017-10-27 (×2): 15 mg/h via INTRAVENOUS
  Administered 2017-10-27: 5 mg/h via INTRAVENOUS
  Administered 2017-10-28: 15 mg/h via INTRAVENOUS
  Filled 2017-10-27 (×4): qty 100

## 2017-10-27 MED ORDER — VITAMIN B-1 100 MG PO TABS
100.0000 mg | ORAL_TABLET | Freq: Every day | ORAL | Status: DC
  Administered 2017-10-27 – 2017-11-15 (×20): 100 mg via ORAL
  Filled 2017-10-27 (×22): qty 1

## 2017-10-27 MED ORDER — LEVETIRACETAM 500 MG PO TABS
1500.0000 mg | ORAL_TABLET | Freq: Two times a day (BID) | ORAL | Status: DC
  Administered 2017-10-27 – 2017-11-15 (×40): 1500 mg via ORAL
  Filled 2017-10-27 (×3): qty 6
  Filled 2017-10-27 (×19): qty 3
  Filled 2017-10-27: qty 6
  Filled 2017-10-27 (×12): qty 3
  Filled 2017-10-27: qty 6
  Filled 2017-10-27: qty 3
  Filled 2017-10-27: qty 6
  Filled 2017-10-27 (×4): qty 3

## 2017-10-27 MED ORDER — PHENYTOIN SODIUM EXTENDED 30 MG PO CAPS
30.0000 mg | ORAL_CAPSULE | Freq: Two times a day (BID) | ORAL | Status: DC
  Administered 2017-10-27 – 2017-11-15 (×40): 30 mg via ORAL
  Filled 2017-10-27 (×46): qty 1

## 2017-10-27 MED ORDER — MAGNESIUM SULFATE 2 GM/50ML IV SOLN
2.0000 g | Freq: Once | INTRAVENOUS | Status: AC
  Administered 2017-10-27: 2 g via INTRAVENOUS
  Filled 2017-10-27: qty 50

## 2017-10-27 MED ORDER — INSULIN ASPART 100 UNIT/ML ~~LOC~~ SOLN
0.0000 [IU] | Freq: Every day | SUBCUTANEOUS | Status: DC
  Administered 2017-10-28: 0 [IU] via SUBCUTANEOUS
  Administered 2017-11-05: 2 [IU] via SUBCUTANEOUS

## 2017-10-27 MED ORDER — DOCUSATE SODIUM 100 MG PO CAPS
200.0000 mg | ORAL_CAPSULE | Freq: Two times a day (BID) | ORAL | Status: DC
  Administered 2017-10-27 – 2017-11-15 (×32): 200 mg via ORAL
  Filled 2017-10-27 (×35): qty 2

## 2017-10-27 MED ORDER — LISINOPRIL 10 MG PO TABS
10.0000 mg | ORAL_TABLET | Freq: Two times a day (BID) | ORAL | Status: DC
Start: 2017-10-27 — End: 2017-10-29
  Administered 2017-10-27 – 2017-10-29 (×5): 10 mg via ORAL
  Filled 2017-10-27 (×5): qty 1

## 2017-10-27 MED ORDER — POLYETHYLENE GLYCOL 3350 17 G PO PACK: 17.0000 g | PACK | Freq: Every day | ORAL | Status: DC | PRN

## 2017-10-27 MED ORDER — LORAZEPAM 2 MG/ML IJ SOLN: 1.0000 mg | Freq: Four times a day (QID) | INTRAMUSCULAR | Status: AC | PRN

## 2017-10-27 MED ORDER — ADULT MULTIVITAMIN W/MINERALS CH
1.0000 | ORAL_TABLET | Freq: Every day | ORAL | Status: DC
  Administered 2017-10-27 – 2017-11-15 (×20): 1 via ORAL
  Filled 2017-10-27 (×21): qty 1

## 2017-10-27 NOTE — ED Provider Notes (Signed)
1:51 AM Patient was up for discharge after being seen by Dr. Gilford Raid.  He was noted to be tachycardic in the 140s.  EKG showed atrial fibrillation with rapid ventricular response.  He has no history of atrial fibrillation.  After consultation with cardiology we will administer amiodarone 150 mg.  3:00 AM No change with IV amiodarone.  We will give 2.5 mg of metoprolol IV.  5:21 AM Heart rate in the 120s after 5 mg of metoprolol IV.  5:55 AM Heart rate in the 110s after a total of 10 mg of metoprolol IV.  Dr. Myna Hidalgo of hospitalist to evaluate.    Ellary Casamento, Jenny Reichmann, MD 10/27/17 [phone]
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
## 2017-10-27 NOTE — Progress Notes (Signed)
Patient ID: Wyatt Salas, male   DOB: 12-08-1950, 67 y.o.   MRN: [id number] Patient with reported IV contrast extravasation in the right antecubital fossa region during CT scans performed last night.  On exam today site is clean, dry, without significant edema, blistering ,erythema and nontender.  Distal pulses are intact.  No further treatment necessary at this time .

## 2017-10-27 NOTE — Progress Notes (Signed)
ANTICOAGULATION CONSULT NOTE - Follow Up Consult  Pharmacy Consult for heparin Indication: atrial fibrillation  Allergies  Allergen Reactions  . Shellfish Allergy Rash    Patient Measurements: Height: 6\' 1"  (185.4 cm) Weight: 121 lb 0.5 oz (54.9 kg) IBW/kg (Calculated) : 79.9 Heparin Dosing Weight: 55kg  Vital Signs: Temp: 98.1 F (36.7 C) (04/10 1200) Temp Source: Oral (04/10 1200) BP: 133/75 (04/10 1400) Pulse Rate: 118 (04/10 1400)  Labs: Recent Labs    11/11/2017 2052 10/27/17 1043 10/27/17 1532  HGB 12.0*  --   --   HCT 38.0*  --   --   PLT 391  --   --   APTT  --  29  --   LABPROT  --  14.2  --   INR  --  1.11  --   HEPARINUNFRC  --   --  <0.10*  CREATININE 0.62  --   --     Estimated Creatinine Clearance: 68.6 mL/min (by C-G formula based on SCr of 0.62 mg/dL).   Assessment: 26 YOM [facility] ED following fall.  In ED telemetry revealed Atrial fibrillation with RVR.  Pharmacy dosing heparin gtt for stroke prevention.    - baseline coags: INR = 1.11, aPTT = 29sec  Today, 10/27/2017  Initial heparin level is <0.1 on heparin at 8500 units/hr  Per [facility] and cardiology, due to h/o multiple falls w/ a history of SDH, patient will not be a candidate for long-term anticoagulation  Goal of Therapy:  Heparin level 0.3-0.7 units/ml Monitor platelets by anticoagulation protocol: Yes   Plan:   Increase heparin rate to 1050 units/hr  Check 6h heparin level  Daily heparin level and CBC  Monitor for bleeding  Doreene Eland, PharmD, BCPS.   Pager: [phone] 10/27/2017 4:39 PM

## 2017-10-27 NOTE — ED Notes (Signed)
ATTEMPTED 2ND IV ACCESS VIA [place]. NOT SUCCESSFUL. CHARGE TIM SMITH RN AT BEDSIDE TO ASSIST WITH ACCESS. PT AWARE OF BED ASSIGNMENT AND PLAN OF CARE

## 2017-10-27 NOTE — Progress Notes (Signed)
ANTICOAGULATION CONSULT NOTE - Initial Consult  Pharmacy Consult for Heparin Indication: atrial fibrillation  Allergies  Allergen Reactions  . Shellfish Allergy Rash    Patient Measurements: Weight: 150 lb (68 kg) Heparin Dosing Weight:   Vital Signs: BP: 110/97 (04/10 0600) Pulse Rate: 116 (04/10 0600)  Labs: Recent Labs    11/03/2017 2052  HGB 12.0*  HCT 38.0*  PLT 391  CREATININE 0.62    Estimated Creatinine Clearance: 85 mL/min (by C-G formula based on SCr of 0.62 mg/dL).   Medical History: Past Medical History:  Diagnosis Date  . Diabetes mellitus without complication ([place])    Type II  . Diastolic dysfunction    a. ECHO 07/2013 EF 55-60% and grade 1 diastolic dysfunction  . NSTEMI (non-ST elevated myocardial infarction) ([place])    problem list 01/2014  . Rhabdomyolysis 01/2014  . Seizures ([place])   . Syncope 01/2014  . Traumatic brain injury [facility])     Medications:   (Not in a hospital admission) Scheduled:  . atorvastatin  40 mg Oral q1800  . docusate sodium  200 mg Oral BID  . ENSURE PLUS  237 mL Oral BID BM  . folic acid  1 mg Oral Daily  . heparin  3,000 Units Intravenous Once  . insulin aspart  0-5 Units Subcutaneous QHS  . insulin aspart  0-9 Units Subcutaneous TID WC  . iopamidol      . lacosamide  100 mg Oral BID  . levETIRAcetam  1,500 mg Oral BID  . lisinopril  10 mg Oral BID  . multivitamin with minerals  1 tablet Oral Daily  . phenytoin  30 mg Oral BID  . sodium chloride flush  3 mL Intravenous Q12H  . thiamine  100 mg Oral Daily   Infusions:  . sodium chloride 75 mL/hr at 10/27/17 0607  . diltiazem (CARDIZEM) infusion 5 mg/hr (10/27/17 7001)  . heparin      Assessment: Patient with new afib.  No oral anticoagulants noted on med rec.  Baseline coags ordered.  Goal of Therapy:  Heparin level 0.3-0.7 units/ml Monitor platelets by anticoagulation protocol: Yes   Plan:  Heparin bolus 3000 units iv x1 Heparin drip at 850  units/hr Daily CBC Next heparin level at [place], [place] 10/27/2017,6:12 AM

## 2017-10-27 NOTE — ED Notes (Signed)
Bed: HT97 Expected date:  Expected time:  Means of arrival:  Comments: Hold for Room 3

## 2017-10-27 NOTE — H&P (Signed)
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 infusion given CHADS-VASc of 4 (age, CHF, DM, HTN)   2. Lung mass  - Lung mass was noted incidentally last year, concerning for cancer and pulmonology scheduled bronchoscopy, but pt cancelled  - There has been interval enlargement in the mass, as well as soft-tissue about the mediastinum and right hilum concerning for metastatic adenopathy  - Findings discussed with patient; he reports readiness to undergo recommended workup at this point    3. Urinary retention  - Foley placed in ED and draining  - Has hx of same - Normal size prostate on CT    4. Seizure disorder  - Continue Keppra, Dilantin, and Vimpat    5. Chronic diastolic CHF  - Appears hypovolemic on admission and was treated in ED with 2 liters NS  - Continue gentle IVF hydration, follow daily wt and I/O's, continue ACE-i   6. Type II DM  - A1c was 6.0% one year ago  - Managed with metformin at home, held on admission  - Check CBG's and use a SSI with Novolog while in hospital   7. Left humerus fracture  [address] from his wheelchair and seen in ED on 09/20/17, found to have distal humerus fracture  - Arm is  immobilized in splint and he is neurovascularly intact distally  - He was given instructions for orthopedic follow-up last month, but did not follow-through  - Continue supportive care    8. Hypertension  - Continue lisinopril    DVT prophylaxis: IV heparin infusion  Code Status: Full  Family Communication: Discussed with patient Consults called: Cardiology Admission status: Inpatient    Vianne Bulls, MD [facility] Pager [phone]  If 7PM-7AM, please contact night-coverage [email or web address] Password [email or web address]  10/27/2017, 6:06 AM

## 2017-10-27 NOTE — ED Notes (Signed)
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 administration in time range)    Or  ondansetron (ZOFRAN) injection 4 mg (has no administration in time range)  insulin aspart (novoLOG) injection 0-9 Units (has no administration in time range)  insulin aspart (novoLOG) injection 0-5 Units (has no administration in time range)  heparin ADULT infusion 100 units/mL (25000 units/254msodium chloride 0.45%) (has no administration in time range)  heparin bolus via infusion 3,000 Units (has no administration in time range)  sodium chloride 0.9 % bolus 1,000 mL (0 mLs Intravenous Stopped 10/30/2017 2209)  sodium chloride 0.9 % bolus 1,000 mL (0 mLs Intravenous Stopped 10/27/17 0130)  amiodarone (NEXTERONE) IV bolus only 150 mg/100 mL (0 mg Intravenous Stopped 10/27/17 0215)  metoprolol tartrate (LOPRESSOR) injection 2.5 mg (2.5 mg Intravenous Given 10/27/17 0313)  metoprolol tartrate (LOPRESSOR) injection 2.5 mg (2.5 mg Intravenous Given 10/27/17 0337)  metoprolol tartrate (LOPRESSOR) injection 5 mg (5 mg Intravenous Given 10/27/17 0529)     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

## 2017-10-27 NOTE — Consult Note (Addendum)
Cardiology Consultation:   Patient ID: Wyatt Salas; [id number]; Nov 30, 1950   Admit date: 10/22/2017 Date of Consult: 10/27/2017  Primary Care Provider: [name], MD Primary Cardiologist: Seen by Dr. Harl Bowie and Dr. Ellyn Hack 09/2016   Patient Profile:   Wyatt Salas is a 67 y.o. male , wheel chair dependent, high fall risk w/ multiple falls in the past 2 months resulting in multiple fractures, as well as remote h/o subdural hematoma s/p right sided craniotomy, seizure disorder, lung mass concerning for cancer, chronic hep C, HTN, HLD and chronic diastolic HF, being readmitted for recurrent mechanical fall and new onset atrial fibrillation w/ RVR. Cardiology consulted to assist with management of atrial fibrillation, at the request of Dr. Sloan Leiter, Internal Medicine.   History of Present Illness:   Wyatt Salas has a h/o subdural hematoma s/p right sided craniotomy looks to have occurred in 2000, DM2, seizure disorder, chronic hep C, HL, HTN, lung mass concerning for cancer identified last year but untreated and chronic diastolic HF. He is also wheel chair dependent and resides at an independent living facility. He has been seen by our practice twice during previous hospital admissions, but I do not see that he has ever followed up in clinic. Per records, [place] was consulted 07/2013 for mildly elevated troponins (peak at 1.12). This was also in the setting of pneumonia/sepsis with subsequent tachycardia, as well as marked elevation of total CK, felt secondary to rhabdomyolysis (found on floor for unknown duration of time). Echo during that time showed normal LVEF at 55-60% and G1DD. No WMAs. Pt was evaluated by Dr. Mare Ferrari who felt that his troponin elevation was 2/2 demand ischemia. No further cardiac w/u was recommended at that time.   Pt was seen again 09/2016 by Dr. Harl Bowie and Dr. Ellyn Hack after being hospitalized for a fall. Consult was for elevated troponin, peaking at 0.87.  The fall was mechanical and pt denied syncope. He also denied CP. He had a repeat echo showing normal LVEF, 50-55%, G2DD and normal wall motion. Both doctors agreed that in the absence of high risk findings, further ischemic test would not be pursued given the patient's very poor functional status and conditioning (pt is wheel chair dependent). It was also outlined in cardiology consult note 10/14/16 that prior to initiation of any form of anticoagulation, to clear with neuro given his prior history of subdural bleed.   Per chart review, the patient has had 2 recently documented falls. He was seen in the ED 2/19 after a fall, while try to transfer from his wheel chair to his bed. He injured his ankle and there were concerns for possible medial malleolus fracture. He was placed in a splint and instructed to f/u with ortho. He presented back 09/21/17 after another fall, this time injuring his left elbow and found to have a closed distal humerus fracture. He was placed in a long-arm splint and instructed to f/u with ortho.   He is back in the [facility] ED again after his 3rd reported fall in the past 2 month. He apparently struck the left side of his forehead and left shoulder. In the ED, non contrast head CT was negative for acute abnormality and cervical spine CT is negative for acute injury. CT of the chest is concerning for interval increase in right upper lobe mass and increase in soft tissue prominence about the mediastinum and right hilum concerning for metastatic adenopathy. Pt also noted to be in atrial fibrillation w/ RVR in  the 150s. This appears to be his first documented occurrence. Cardiology consulted for assistance. WBC 11.1 but afebrile. hgb 12.0. K 4.1. SCr 0.62. BUN 9. TSH pending. Troponin negative.   Pt has been started on IV Cardizem and IV heparin per ED/ Internal Medicine. He denies palpitations. No chest pain or dyspnea. Current rate in the 120s with NSVT on tele. Asymptomatic.   Past Medical  History:  Diagnosis Date  . Diabetes mellitus without complication ([place])    Type II  . Diastolic dysfunction    a. ECHO 07/2013 EF 55-60% and grade 1 diastolic dysfunction  . NSTEMI (non-ST elevated myocardial infarction) ([place])    problem list 01/2014  . Rhabdomyolysis 01/2014  . Seizures ([place])   . Syncope 01/2014  . Traumatic brain injury [facility])     Past Surgical History:  Procedure Laterality Date  . CRANIOTOMY       Home Medications:  Prior to Admission medications   Medication Sig Start Date End Date Taking? Authorizing Provider  acetaminophen (TYLENOL) 500 MG tablet Take 1 tablet (500 mg total) by mouth every 6 (six) hours as needed. Patient taking differently: Take 500 mg by mouth 2 (two) times daily.  10/05/17  Yes Burky, Lanelle Bal B, NP  docusate sodium (COLACE) 100 MG capsule Take 200 mg by mouth 2 (two) times daily.   Yes [provider]  ENSURE PLUS (ENSURE PLUS) LIQD Take 237 mLs by mouth 2 (two) times daily between meals.   Yes [provider]  folic acid (FOLVITE) 1 MG tablet Take 1 tablet (1 mg total) daily by mouth. 05/25/17  Yes Barton [place], MD  lacosamide (VIMPAT) 50 MG TABS tablet Take 100 mg by mouth 2 (two) times daily.   Yes [provider]  levETIRAcetam (KEPPRA) 750 MG tablet Take 1,500 mg by mouth 2 (two) times daily.   Yes [provider]  lisinopril (PRINIVIL,ZESTRIL) 10 MG tablet Take 10 mg by mouth 2 (two) times daily.   Yes [provider]  metFORMIN (GLUCOPHAGE) 500 MG tablet Take 500 mg by mouth daily with breakfast.    Yes [provider]  Multiple Vitamin (MULTIVITAMIN WITH MINERALS) TABS tablet Take 1 tablet by mouth daily.   Yes [provider]  phenytoin (DILANTIN) 30 MG ER capsule Take 1 capsule (30 mg total) by mouth 2 (two) times daily. 10/15/16 05/27/18 Yes Arrien, [name], MD  simvastatin (ZOCOR) 80 MG tablet Take 80 mg at bedtime by mouth.    Yes [provider]    thiamine 100 MG tablet Take 1 tablet (100 mg total) daily by mouth. 05/25/17  Yes Barton [place], MD  carboxymethylcellulose (REFRESH PLUS) 0.5 % SOLN Place 1 drop into both eyes 2 (two) times daily as needed (Dry eyes).    [provider]  galantamine (RAZADYNE) 8 MG tablet Take 1 tablet (8 mg total) by mouth 2 (two) times daily with a meal. Patient not taking: Reported on 11/04/2017 07/31/13   [name], MD  HYDROcodone-acetaminophen (NORCO) 5-325 MG tablet Take 1 tablet by mouth every 6 (six) hours as needed for severe pain. Patient not taking: Reported on 11/04/2017 09/13/17   Julianne Rice, MD    Inpatient Medications: Scheduled Meds: . atorvastatin  40 mg Oral q1800  . docusate sodium  200 mg Oral BID  . ENSURE PLUS  237 mL Oral BID BM  . folic acid  1 mg Oral Daily  . heparin  3,000 Units Intravenous Once  . insulin aspart  0-5 Units Subcutaneous QHS  . insulin aspart  0-9 Units Subcutaneous TID WC  . iopamidol      . lacosamide  100 mg Oral BID  . levETIRAcetam  1,500 mg Oral BID  . lisinopril  10 mg Oral BID  . multivitamin with minerals  1 tablet Oral Daily  . phenytoin  30 mg Oral BID  . sodium chloride flush  3 mL Intravenous Q12H  . thiamine  100 mg Oral Daily   Continuous Infusions: . sodium chloride 75 mL/hr at 10/27/17 0607  . diltiazem (CARDIZEM) infusion 7.5 mg/hr (10/27/17 0651)  . heparin     PRN Meds: acetaminophen **OR** acetaminophen, HYDROcodone-acetaminophen, ondansetron **OR** ondansetron (ZOFRAN) IV, polyethylene glycol, polyvinyl alcohol  Allergies:    Allergies  Allergen Reactions  . Shellfish Allergy Rash    Social History:   Social History   Socioeconomic History  . Marital status: Divorced    Spouse name: Not on file  . Number of children: Not on file  . Years of education: Not on file  . Highest education level: Not on file  Occupational History  . Not on file  Social Needs  . Financial resource strain: Not on file  .  Food insecurity:    Worry: Not on file    Inability: Not on file  . Transportation needs:    Medical: Not on file    Non-medical: Not on file  Tobacco Use  . Smoking status: Former Smoker    Types: Cigars  . Smokeless tobacco: Never Used  Substance and Sexual Activity  . Alcohol use: No    Frequency: Never  . Drug use: Yes    Types: Marijuana  . Sexual activity: Not on file  Lifestyle  . Physical activity:    Days per week: Not on file    Minutes per session: Not on file  . Stress: Not on file  Relationships  . Social connections:    Talks on phone: Not on file    Gets together: Not on file    Attends religious service: Not on file    Active member of club or organization: Not on file    Attends meetings of clubs or organizations: Not on file    Relationship status: Not on file  . Intimate partner violence:    Fear of current or ex partner: Not on file    Emotionally abused: Not on file    Physically abused: Not on file    Forced sexual activity: Not on file  Other Topics Concern  . Not on file  Social History Narrative  . Not on file    Family History:    Family History  Problem Relation Age of Onset  . Headache Mother   . Diabetes Neg Hx   . Stroke Neg Hx      ROS:  Please see the history of present illness.   All other ROS reviewed and negative.     Physical Exam/Data:   Vitals:   10/27/17 0400 10/27/17 0600 10/27/17 0630 10/27/17 0853  BP: 119/89 (!) 110/97 107/77 113/83  Pulse: (!) 118 (!) 116 (!) 116 (!) 120  Resp: 20 18 (!) 22 16  Temp:    98.7 F (37.1 C)  TempSrc:    Oral  SpO2: 99% 98% 98% 98%  Weight:  150 lb (68 kg)      Intake/Output Summary (Last 24 hours) at 10/27/2017 0916 Last data filed at 10/27/2017 0728 Gross per 24 hour  Intake  2500 ml  Output 1600 ml  Net 900 ml   Filed Weights   10/27/17 0600  Weight: 150 lb (68 kg)   Body mass index is 19.79 kg/m.  General:  No acute distress, thin appearing, dentition in poor  repair HEENT: normal Lymph: no adenopathy Neck: no JVD Endocrine:  No thryomegaly Vascular: No carotid bruits; FA pulses 2+ bilaterally without bruits  Cardiac:  irregularly irregular, tachy rate Lungs:  clear to auscultation bilaterally, no wheezing, rhonchi or rales  Abd: soft, nontender, no hepatomegaly  Ext: no edema Musculoskeletal:  Left arm in cast. No LEE Skin: warm and dry  Neuro:  CNs 2-12 intact, no focal abnormalities noted Psych:  Normal affect   EKG:  The EKG was personally reviewed and demonstrates:  Atrial fibrillation 150 bpm Telemetry:  Telemetry was personally reviewed and demonstrates:  Atrial fibrillation in the 120s and NSVT  Relevant CV Studies: 2D Echo 09/2016 Study Conclusions  - Left ventricle: The cavity size was normal. Wall thickness was   normal. Systolic function was normal. The estimated ejection   fraction was in the range of 50% to 55%. Wall motion was normal;   there were no regional wall motion abnormalities. Features are   consistent with a pseudonormal left ventricular filling pattern,   with concomitant abnormal relaxation and increased filling   pressure (grade 2 diastolic dysfunction). LA normal size.   Laboratory Data:  Chemistry Recent Labs  Lab 11/15/2017 2052  NA 143  K 4.1  CL 104  CO2 26  GLUCOSE 132*  BUN 9  CREATININE 0.62  CALCIUM 10.0  GFRNONAA >60  GFRAA >60  ANIONGAP 13    Recent Labs  Lab 11/06/2017 2052  PROT 7.8  ALBUMIN 3.3*  AST 24  ALT 18  ALKPHOS 81  BILITOT 0.6   Hematology Recent Labs  Lab 10/29/2017 2052  WBC 11.1*  RBC 4.53  HGB 12.0*  HCT 38.0*  MCV 83.9  MCH 26.5  MCHC 31.6  RDW 15.4  PLT 391   Cardiac EnzymesNo results for input(s): TROPONINI in the last 168 hours.  Recent Labs  Lab 11/01/2017 2058  TROPIPOC 0.00    BNPNo results for input(s): BNP, PROBNP in the last 168 hours.  DDimer No results for input(s): DDIMER in the last 168 hours.  Radiology/Studies:  Ct Abdomen  Pelvis Wo Contrast  Result Date: 10/28/2017 CLINICAL DATA:  Left shoulder and forehead pain after fall this morning. Lung mass. EXAM: CT CHEST, ABDOMEN, AND PELVIS WITHOUT CONTRAST TECHNIQUE: Multidetector CT imaging of the chest, abdomen and pelvis was performed without IV contrast. CONTRAST:  The patient's IV infiltrated during imaging and the order was changed to CT of the chest, abdomen and pelvis without per ordering clinician. COMPARISON:  PET-CT 05/31/2017 FINDINGS: CT CHEST FINDINGS Cardiovascular: Top-normal size heart with left main and three-vessel coronary arteriosclerosis. Aortic atherosclerosis without aneurysm. The unenhanced pulmonary vasculature is difficult to assess beyond the main pulmonary artery due to soft tissue masses and adenopathy about the right hilum and mediastinum. Mediastinum/Nodes: Interval increase in masslike abnormalities in the mediastinum and right hilum compatible with progression of metastatic adenopathy and/or increase and extension of known right upper lobe mass. Largest lymph node identified is subcarinal at 2.8 cm. Interval increase in right upper paratracheal lymph node measuring 1.1 cm. Exact margins of additional lymph node masses are difficult to delineate on this unenhanced study. There is luminal narrowing of the distal right mainstem bronchus without occlusion secondary to the right hilar,  mediastinal and subcarinal masslike abnormalities. The thyroid gland is unremarkable. The esophagus is not well visualized. Lungs/Pleura: Partially loculated small to moderate right effusion. New pulmonary nodular masslike opacities are noted within the right upper, middle and lower lobes with interval progression in size of spiculated right upper lobe mass now measuring approximately 5.8 x 5.3 x 5 cm. Within the superior segment of the aerated right lower lobe are metastatic nodules measuring up to 2.2 cm. Additional nodules are seen in the right middle and anterior right lower  lobe. Left lung remains clear. Nodular thickening along the left major fissure is seen which may represent fluid in the fissure versus an intra fissural mass. This measures 2.2 x 1.2 x 1.7 cm. Peribronchial thickening is noted within both lower lobes. Musculoskeletal: Remote right posterior seventh through ninth rib fractures. No suspicious osseous lesions. Extravasated contrast noted in the soft tissues of the right arm. CT ABDOMEN PELVIS FINDINGS Hepatobiliary: No biliary dilatation. The unenhanced liver is unremarkable. The gallbladder is physiologically distended. Pancreas: No ductal dilatation or inflammation. Spleen: No splenomegaly or mass. Adrenals/Urinary Tract: No adrenal mass. Interpolar 2.5 cm simple right renal cyst. Unremarkable noncontrast appearance of the left kidney. No obstructive uropathy. Marked bladder distention to the umbilicus with the bladder measuring 14.7 x 14.1 x 18.8 cm (volume = 2040 cm^3). Stomach/Bowel: Stomach is within normal limits. Appendix appears normal. No evidence of bowel wall thickening, distention, or inflammatory changes. Vascular/Lymphatic: Moderate aortoiliac and branch vessel atherosclerosis. No aneurysm. No adenopathy. Reproductive: Normal size prostate. Other: No free air nor free fluid. Musculoskeletal: Degenerative disc disease L5-S1. No aggressive osseous lesions. IMPRESSION: Extravasation of IV contrast into the patient's right arm. Study was flagged for contrast extravasation radiology PA follow-up should the patient be discharged. Chest CT: 1. Interval increase in spiculated right upper lobe mass now measuring approximately 5.8 x 5.3 x 5 cm with progression of metastatic mediastinal and hilar adenopathy as well as development of satellite pulmonary nodules in the right lung. Slight luminal narrowing of the distal right mainstem bronchus due to adenopathy. No occlusion noted however. 2. Ovoid density in the left major fissure may represent a pseudo lesion with  fluid in the fissure versus an intra fissural mass measuring 2.2 x 1.2 x 1.7 cm. 3. Loculated moderate right pleural effusion. Abdomen and pelvic CT: 1. Marked urinary distention with volume of approximately 2 L. 2. Simple right interpolar renal cyst measuring 2.5 cm. 3. No apparent evidence of metastatic disease within the abdomen and pelvis given limitations of a noncontrast exam. Electronically Signed   By: Ashley Royalty M.D.   On: 11/08/2017 22:55   Dg Chest 2 View  Result Date: 10/28/2017 CLINICAL DATA:  Cough, history of lung cancer. EXAM: CHEST - 2 VIEW COMPARISON:  05/18/2017 chest CT, CXR 05/18/2017 FINDINGS: Interval increase in size of masslike opacity in the right upper lobe, best appreciated on the sagittal view measuring approximately 4 x 3.5 x 4.8 cm with right paratracheal and perihilar soft tissue prominence concerning for progression of lymphadenopathy. Superimposed adjacent pneumonia is difficult to entirely exclude. Hyperinflated left lung without pulmonary consolidation or dominant mass. Heart size is top-normal. No aortic aneurysm. No suspicious osseous lesions. IMPRESSION: 1. Interval increase in size of right upper lobe posterior masslike abnormality now estimated at 4 x 3.5 x 4.8 cm, previously estimated at approximately 3.6 cm. 2. Interval increase in soft tissue prominence about the mediastinum and right hilum compatible with metastatic lymphadenopathy. Superimposed adjacent airspace opacities cannot exclude pneumonia and/or  areas of atelectasis. Electronically Signed   By: Ashley Royalty M.D.   On: 11/03/2017 19:37   Ct Head Wo Contrast  Result Date: 10/24/2017 CLINICAL DATA:  [place] out of wheelchair today. Left forehead and left shoulder pain. Initial encounter. EXAM: CT HEAD WITHOUT CONTRAST CT CERVICAL SPINE WITHOUT CONTRAST TECHNIQUE: Multidetector CT imaging of the head and cervical spine was performed following the standard protocol without intravenous contrast. Multiplanar CT image  reconstructions of the cervical spine were also generated. COMPARISON:  09/21/2017 FINDINGS: CT HEAD FINDINGS Brain: Right MCA territory encephalomalacia is unchanged as is more extensive right temporal lobe encephalomalacia which may be in part postsurgical. There is marked cerebral scratched of there is marked cerebellar atrophy. Periventricular white matter hypodensities are unchanged and nonspecific but compatible with mild chronic small vessel ischemic disease. No acute large territory infarct, intracranial hemorrhage, mass, midline shift, or extra-axial fluid collection is identified. Vascular: Calcified atherosclerosis at the skull base. No hyperdense vessel. Skull: Prior right pterional craniotomy.  No acute fracture. Sinuses/Orbits: The visualized paranasal sinuses and mastoid air cells are clear. The orbits are unremarkable. Other: None. CT CERVICAL SPINE FINDINGS Alignment: Chronic reversal of the normal cervical lordosis. Unchanged grade 1 anterolisthesis of C2 on C3 and C3 on C4. Skull base and vertebrae: No acute fracture or suspicious osseous lesion. Soft tissues and spinal canal: No prevertebral fluid or swelling. No visible canal hematoma. Disc levels: Moderate diffuse cervical disc degeneration and moderate to severe left greater than right mid upper cervical facet arthrosis resulting in advanced multilevel neural foraminal stenosis. Upper chest: Right pleural effusion. Unchanged 3 mm left apical lung nodule. Other: Moderate calcified atherosclerosis at the carotid bifurcations. IMPRESSION: 1. No evidence of acute intracranial abnormality. 2. Right cerebral hemisphere encephalomalacia. Severe cerebellar atrophy. 3. No acute cervical spine fracture. Electronically Signed   By: Logan Bores M.D.   On: 11/15/2017 19:51   Ct Chest Wo Contrast  Result Date: 11/15/2017 CLINICAL DATA:  Left shoulder and forehead pain after fall this morning. Lung mass. EXAM: CT CHEST, ABDOMEN, AND PELVIS WITHOUT  CONTRAST TECHNIQUE: Multidetector CT imaging of the chest, abdomen and pelvis was performed without IV contrast. CONTRAST:  The patient's IV infiltrated during imaging and the order was changed to CT of the chest, abdomen and pelvis without per ordering clinician. COMPARISON:  PET-CT 05/31/2017 FINDINGS: CT CHEST FINDINGS Cardiovascular: Top-normal size heart with left main and three-vessel coronary arteriosclerosis. Aortic atherosclerosis without aneurysm. The unenhanced pulmonary vasculature is difficult to assess beyond the main pulmonary artery due to soft tissue masses and adenopathy about the right hilum and mediastinum. Mediastinum/Nodes: Interval increase in masslike abnormalities in the mediastinum and right hilum compatible with progression of metastatic adenopathy and/or increase and extension of known right upper lobe mass. Largest lymph node identified is subcarinal at 2.8 cm. Interval increase in right upper paratracheal lymph node measuring 1.1 cm. Exact margins of additional lymph node masses are difficult to delineate on this unenhanced study. There is luminal narrowing of the distal right mainstem bronchus without occlusion secondary to the right hilar, mediastinal and subcarinal masslike abnormalities. The thyroid gland is unremarkable. The esophagus is not well visualized. Lungs/Pleura: Partially loculated small to moderate right effusion. New pulmonary nodular masslike opacities are noted within the right upper, middle and lower lobes with interval progression in size of spiculated right upper lobe mass now measuring approximately 5.8 x 5.3 x 5 cm. Within the superior segment of the aerated right lower lobe are metastatic nodules measuring up  to 2.2 cm. Additional nodules are seen in the right middle and anterior right lower lobe. Left lung remains clear. Nodular thickening along the left major fissure is seen which may represent fluid in the fissure versus an intra fissural mass. This measures  2.2 x 1.2 x 1.7 cm. Peribronchial thickening is noted within both lower lobes. Musculoskeletal: Remote right posterior seventh through ninth rib fractures. No suspicious osseous lesions. Extravasated contrast noted in the soft tissues of the right arm. CT ABDOMEN PELVIS FINDINGS Hepatobiliary: No biliary dilatation. The unenhanced liver is unremarkable. The gallbladder is physiologically distended. Pancreas: No ductal dilatation or inflammation. Spleen: No splenomegaly or mass. Adrenals/Urinary Tract: No adrenal mass. Interpolar 2.5 cm simple right renal cyst. Unremarkable noncontrast appearance of the left kidney. No obstructive uropathy. Marked bladder distention to the umbilicus with the bladder measuring 14.7 x 14.1 x 18.8 cm (volume = 2040 cm^3). Stomach/Bowel: Stomach is within normal limits. Appendix appears normal. No evidence of bowel wall thickening, distention, or inflammatory changes. Vascular/Lymphatic: Moderate aortoiliac and branch vessel atherosclerosis. No aneurysm. No adenopathy. Reproductive: Normal size prostate. Other: No free air nor free fluid. Musculoskeletal: Degenerative disc disease L5-S1. No aggressive osseous lesions. IMPRESSION: Extravasation of IV contrast into the patient's right arm. Study was flagged for contrast extravasation radiology PA follow-up should the patient be discharged. Chest CT: 1. Interval increase in spiculated right upper lobe mass now measuring approximately 5.8 x 5.3 x 5 cm with progression of metastatic mediastinal and hilar adenopathy as well as development of satellite pulmonary nodules in the right lung. Slight luminal narrowing of the distal right mainstem bronchus due to adenopathy. No occlusion noted however. 2. Ovoid density in the left major fissure may represent a pseudo lesion with fluid in the fissure versus an intra fissural mass measuring 2.2 x 1.2 x 1.7 cm. 3. Loculated moderate right pleural effusion. Abdomen and pelvic CT: 1. Marked urinary  distention with volume of approximately 2 L. 2. Simple right interpolar renal cyst measuring 2.5 cm. 3. No apparent evidence of metastatic disease within the abdomen and pelvis given limitations of a noncontrast exam. Electronically Signed   By: Ashley Royalty M.D.   On: 10/29/2017 22:55   Ct Cervical Spine Wo Contrast  Result Date: 11/09/2017 CLINICAL DATA:  [place] [address] out of wheelchair today. Left forehead and left shoulder pain. Initial encounter. EXAM: CT HEAD WITHOUT CONTRAST CT CERVICAL SPINE WITHOUT CONTRAST TECHNIQUE: Multidetector CT imaging of the head and cervical spine was performed following the standard protocol without intravenous contrast. Multiplanar CT image reconstructions of the cervical spine were also generated. COMPARISON:  09/21/2017 FINDINGS: CT HEAD FINDINGS Brain: Right MCA territory encephalomalacia is unchanged as is more extensive right temporal lobe encephalomalacia which may be in part postsurgical. There is marked cerebral scratched of there is marked cerebellar atrophy. Periventricular white matter hypodensities are unchanged and nonspecific but compatible with mild chronic small vessel ischemic disease. No acute large territory infarct, intracranial hemorrhage, mass, midline shift, or extra-axial fluid collection is identified. Vascular: Calcified atherosclerosis at the skull base. No hyperdense vessel. Skull: Prior right pterional craniotomy.  No acute fracture. Sinuses/Orbits: The visualized paranasal sinuses and mastoid air cells are clear. The orbits are unremarkable. Other: None. CT CERVICAL SPINE FINDINGS Alignment: Chronic reversal of the normal cervical lordosis. Unchanged grade 1 anterolisthesis of C2 on C3 and C3 on C4. Skull base and vertebrae: No acute fracture or suspicious osseous lesion. Soft tissues and spinal canal: No prevertebral fluid or swelling. No visible canal hematoma. Disc  levels: Moderate diffuse cervical disc degeneration and moderate to severe left greater  than right mid upper cervical facet arthrosis resulting in advanced multilevel neural foraminal stenosis. Upper chest: Right pleural effusion. Unchanged 3 mm left apical lung nodule. Other: Moderate calcified atherosclerosis at the carotid bifurcations. IMPRESSION: 1. No evidence of acute intracranial abnormality. 2. Right cerebral hemisphere encephalomalacia. Severe cerebellar atrophy. 3. No acute cervical spine fracture. Electronically Signed   By: Logan Bores M.D.   On: 10/24/2017 19:51   Dg Shoulder Left  Result Date: 11/03/2017 CLINICAL DATA:  [place] striking the left side today. Recent distal humeral fracture. EXAM: LEFT SHOULDER - 2+ VIEW COMPARISON:  Left humerus series of Nov 17, 2009 FINDINGS: There is chronic deformity of the distal clavicle. No acute clavicular fracture is observed. The scapula and proximal humerus appear normal. There is narrowing of the glenohumeral joint space. IMPRESSION: There is no acute fracture or dislocation of the left shoulder. 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 There is chronic deformity of the distal clavicle. Electronically Signed   By: David  Martinique M.D.   On: 11/15/2017 11:20    Assessment and Plan:   VIRGILIO BROADHEAD is a 67 y.o. male, wheel chair dependent, high fall risk w/ multiple falls in the past 2 months resulting in multiple fractures, as well as remote h/o subdural hematoma s/p right sided craniotomy, seizure disorder,  lung mass concerning for cancer, chronic hep C, HTN, HLD and chronic diastolic HF, being readmitted for recurrent mechanical fall and new onset atrial fibrillation w/ RVR. Cardiology consulted to assist with management of atrial fibrillation, at the request of Dr. Sloan Leiter, Internal Medicine.   1. Atrial Fibrillation w/ RVR: first documented occurrence, unknown duration. EKG shows V rates in the 150s. BP is stable and he has been started on IV Cardizem. WBC 11.1 but afebrile. Hgb 12.0. K 4.1. SCr 0.62. BUN 9.  TSH pending. Troponin negative. CXR w/o edema but does show interval increase in size of RUL lung mass, likely malignant, also cannot exclude PNA. UA negative for UTI. CHA2DS2 VASc score is at least 3 for HTN, DM and age 71-74. This patients CHA2DS2-VASc Score and unadjusted Ischemic Stroke Rate (% per year) is equal to 3.2 % stroke rate/year from a score of 3  Agree with IV Cardizem for rate control, currently on 7.5 mg/hr and can further titrate if needed. Can also consider adding BB. Given pt is high fall risk, would opt for rate control management only. He is not a good candidate for oral anticoagulation given extremely high fall risk (at least 3 documented mechanical falls in the past 2 months resulting in numerous bone fractures in ankle and elbow, also with remote subdural hematoma s/p right sided craniotomy). For this reason, he is also not a good candidate for rhythm control strategies nor watchman's device given need for short term anticoagulation with these options.  Check 2D echo once rate improves. Will also check Mg level given pt reports h/o ETOH use.  2. NSVT: noted on tele. Asymptomatic. K WNL. TSH pending. Pt admits to ETOH use>> Will check Mg level. Continue Cardizem. May need to add BB. Check 2D echo once rate improves. Continue to monitor.   3. Mechanical Fall: head CT negative. Cervical spine CT negative.   4. Lung Mass: noted last year but ? If pt declined w/u. CT of the chest is concerning for interval increase in right upper lobe mass and increase in soft tissue prominence about the  mediastinum and right hilum concerning for metastatic adenopathy. Further discussion regarding w/u and management per IM.    For questions or updates, please contact [facility] Please consult [email or web address] for contact info under Cardiology/STEMI.   Signed, Lyda Jester, PA-C  10/27/2017 9:16 AM   Personally seen and examined. Agree with above. 67 year old with frequent falls, prior  subdural hematoma, lung mass here with new onset atrial fibrillation rapid ventricular response.  He is currently asymptomatic, no chest pain, no shortness of breath, no syncope.  He told me a story about his pastor who died at [place].  Exam: Thin male, heart irregularly irregular, tachycardic in the 120s, lungs clear to auscultation, no edema  Labs: Creatinine 0.62.  Personally reviewed  EKG: Atrial fibrillation with rapid ventricular response 150 bpm.  Personally viewed.  Telemetry: Brief periods of wide-complex tachycardia, possibly atrial fibrillation with aberrancy.  Assessment and plan:  Atrial fibrillation with rapid ventricular response paroxysmal -Newly discovered.  Not an anticoagulation candidate given his multiple falls, prior subdural hematoma, risks of bleeding outweigh benefits.  Chads vas score is 3.  Currently on IV heparin during his hospitalization.  If his atrial fibrillation is fairly new onset, there is a chance he will auto converted in the next 24-40  I agree with utilization of diltiazem IV currently at 7.5 mg/h.  I have asked the nurse to increase this to 10 mg/h given that his heart rate still between 121-130.  If his heart rate remains above 110 in 1 hour, increase to 15 mg/h.  Once his heart rate is under adequate rate control, converted to p.o. diltiazem accordingly.  Also once heart rate is under better control, repeat echocardiogram to ensure normal function.  Previously normal.  History of normal echoes as well as mildly elevated troponin previously medically managed.  Lung mass: Increasing size right upper lobe lung mass.  Per primary team.  Please let us know we can be of further assistance.  We will sign off.  Agree with current treatment plan.  Candee Furbish, MD

## 2017-10-28 ENCOUNTER — Telehealth (INDEPENDENT_AMBULATORY_CARE_PROVIDER_SITE_OTHER): Payer: Self-pay

## 2017-10-28 DIAGNOSIS — S42402A Unspecified fracture of lower end of left humerus, initial encounter for closed fracture: Secondary | ICD-10-CM

## 2017-10-28 DIAGNOSIS — R918 Other nonspecific abnormal finding of lung field: Secondary | ICD-10-CM

## 2017-10-28 DIAGNOSIS — E43 Unspecified severe protein-calorie malnutrition: Secondary | ICD-10-CM

## 2017-10-28 LAB — CBC
HEMATOCRIT: 36.2 % — AB (ref 39.0–52.0)
Hemoglobin: 11.1 g/dL — ABNORMAL LOW (ref 13.0–17.0)
MCH: 25.6 pg — ABNORMAL LOW (ref 26.0–34.0)
MCHC: 30.7 g/dL (ref 30.0–36.0)
MCV: 83.4 fL (ref 78.0–100.0)
Platelets: 370 10*3/uL (ref 150–400)
RBC: 4.34 MIL/uL (ref 4.22–5.81)
RDW: 15.2 % (ref 11.5–15.5)
WBC: 9.5 10*3/uL (ref 4.0–10.5)

## 2017-10-28 LAB — GLUCOSE, CAPILLARY
GLUCOSE-CAPILLARY: 171 mg/dL — AB (ref 65–99)
GLUCOSE-CAPILLARY: 167 mg/dL — AB (ref 65–99)
Glucose-Capillary: 183 mg/dL — ABNORMAL HIGH (ref 65–99)
Glucose-Capillary: 132 mg/dL — ABNORMAL HIGH (ref 65–99)

## 2017-10-28 LAB — BASIC METABOLIC PANEL
ANION GAP: 14 (ref 5–15)
BUN: 11 mg/dL (ref 6–20)
CO2: 23 mmol/L (ref 22–32)
Calcium: 9.5 mg/dL (ref 8.9–10.3)
Chloride: 101 mmol/L (ref 101–111)
Creatinine, Ser: 0.72 mg/dL (ref 0.61–1.24)
GLUCOSE: 175 mg/dL — AB (ref 65–99)
POTASSIUM: 3.5 mmol/L (ref 3.5–5.1)
Sodium: 138 mmol/L (ref 135–145)

## 2017-10-28 LAB — COMPREHENSIVE METABOLIC PANEL
ALT: 28 U/L (ref 17–63)
AST: 40 U/L (ref 15–41)
Albumin: 2.5 g/dL — ABNORMAL LOW (ref 3.5–5.0)
Alkaline Phosphatase: 69 U/L (ref 38–126)
Anion gap: 13 (ref 5–15)
BILIRUBIN TOTAL: 0.5 mg/dL (ref 0.3–1.2)
BUN: 10 mg/dL (ref 6–20)
CO2: 22 mmol/L (ref 22–32)
CREATININE: 0.62 mg/dL (ref 0.61–1.24)
Calcium: 8.8 mg/dL — ABNORMAL LOW (ref 8.9–10.3)
Chloride: 98 mmol/L — ABNORMAL LOW (ref 101–111)
GFR calc Af Amer: >60 mL/min (ref >60)
Glucose, Bld: 258 mg/dL — ABNORMAL HIGH (ref 65–99)
Potassium: 2.9 mmol/L — ABNORMAL LOW (ref 3.5–5.1)
Sodium: 133 mmol/L — ABNORMAL LOW (ref 135–145)
TOTAL PROTEIN: 6.1 g/dL — AB (ref 6.5–8.1)

## 2017-10-28 LAB — MAGNESIUM
MAGNESIUM: 1.7 mg/dL (ref 1.7–2.4)
Magnesium: 1.9 mg/dL (ref 1.7–2.4)

## 2017-10-28 LAB — URINE CULTURE: Culture: <10000 — AB

## 2017-10-28 LAB — HEPARIN LEVEL (UNFRACTIONATED): Heparin Unfractionated: <0.1 IU/mL — ABNORMAL LOW (ref 0.30–0.70)

## 2017-10-28 MED ORDER — POTASSIUM CHLORIDE 10 MEQ/100ML IV SOLN
10.0000 meq | INTRAVENOUS | Status: AC
  Administered 2017-10-28 (×2): 10 meq via INTRAVENOUS
  Filled 2017-10-28 (×3): qty 100

## 2017-10-28 MED ORDER — MAGNESIUM SULFATE IN D5W 1-5 GM/100ML-% IV SOLN
1.0000 g | Freq: Once | INTRAVENOUS | Status: AC
  Administered 2017-10-28: 1 g via INTRAVENOUS
  Filled 2017-10-28: qty 100

## 2017-10-28 MED ORDER — POTASSIUM CHLORIDE CRYS ER 20 MEQ PO TBCR
40.0000 meq | EXTENDED_RELEASE_TABLET | Freq: Once | ORAL | Status: AC
  Administered 2017-10-28: 40 meq via ORAL
  Filled 2017-10-28: qty 2

## 2017-10-28 MED ORDER — METOPROLOL TARTRATE 25 MG PO TABS
25.0000 mg | ORAL_TABLET | Freq: Two times a day (BID) | ORAL | Status: DC
  Administered 2017-10-28 – 2017-11-04 (×13): 25 mg via ORAL
  Filled 2017-10-28 (×15): qty 1

## 2017-10-28 MED ORDER — ENOXAPARIN SODIUM 40 MG/0.4ML ~~LOC~~ SOLN
40.0000 mg | SUBCUTANEOUS | Status: DC
  Administered 2017-10-28 – 2017-11-04 (×8): 40 mg via SUBCUTANEOUS
  Filled 2017-10-28 (×9): qty 0.4

## 2017-10-28 MED ORDER — MAGNESIUM SULFATE 2 GM/50ML IV SOLN
2.0000 g | Freq: Once | INTRAVENOUS | Status: AC
  Administered 2017-10-28: 2 g via INTRAVENOUS
  Filled 2017-10-28: qty 50

## 2017-10-28 NOTE — Telephone Encounter (Signed)
See message below. Patient has not seen [place] before. Was seen at ED/ urgent care.

## 2017-10-28 NOTE — Consult Note (Addendum)
Name: Wyatt Salas MRN: [id number] DOB: May 05, 1951    ADMISSION DATE:  11/06/2017 CONSULTATION DATE:  10/28/17  REFERRING MD :  Dr. Sloan Leiter / [facility]   CHIEF COMPLAINT:  Lung Mass    HISTORY OF PRESENT ILLNESS:  67 y/o M who presented to [facility] ER on 4/9 with reports of left forehead & left shoulder pain after falling out of his wheelchair.  The patient was in a LUE cast on arrival from prior fall.  In addition, he reported cough with productive sputum.    The patient carries a history of ETOH abuse, DM, seizures, chronic diastolic CHF, TBI and known lung mass worrisome for cancer.  He was previously arranged for outpatient bronchoscopy with Dr. Lamonte Sakai in November 2018 but the patient canceled the appointment asking for a second opinion at the [place].  He reported on admit that he went to the [place] and was told he didn't have lung cancer.  At baseline, he lives alone and uses a four prong walker to transfer from bed to chair.    In the ER, he was initially evaluated wihout acute findings / negative LUE XRAY, CT head/neck and was set for discharge.  At time of discharge, he was noted to have AFwRVR (150's).  The patient was admitted for further work up.  CBC showed a normocytic anemia with hgb of 12.  UDS negative, troponin negative.  CXR showed an interval increase in the size of the RUL posterior masslike abnormality (4 x 3.5 x 4.8 cm, previously 3.6 cm).  He was treated for AF with diltiazem and lopressor.    PCCM called for evaluation of lung mass.   PAST MEDICAL HISTORY :   has a past medical history of Diabetes mellitus without complication ([place]), Diastolic dysfunction, NSTEMI (non-ST elevated myocardial infarction) ([place]), Rhabdomyolysis (01/2014), Seizures ([place]), Syncope (01/2014), and Traumatic brain injury ([place]).   has a past surgical history that includes Craniotomy.  Prior to Admission medications   Medication Sig Start Date End Date Taking? Authorizing Provider  acetaminophen (TYLENOL) 500 MG  tablet Take 1 tablet (500 mg total) by mouth every 6 (six) hours as needed. Patient taking differently: Take 500 mg by mouth 2 (two) times daily.  10/05/17  Yes Burky, Lanelle Bal B, NP  docusate sodium (COLACE) 100 MG capsule Take 200 mg by mouth 2 (two) times daily.   Yes [provider]  ENSURE PLUS (ENSURE PLUS) LIQD Take 237 mLs by mouth 2 (two) times daily between meals.   Yes [provider]  folic acid (FOLVITE) 1 MG tablet Take 1 tablet (1 mg total) daily by mouth. 05/25/17  Yes Barton Dubois, MD  lacosamide (VIMPAT) 50 MG TABS tablet Take 100 mg by mouth 2 (two) times daily.   Yes [provider]  levETIRAcetam (KEPPRA) 750 MG tablet Take 1,500 mg by mouth 2 (two) times daily.   Yes [provider]  lisinopril (PRINIVIL,ZESTRIL) 10 MG tablet Take 10 mg by mouth 2 (two) times daily.   Yes [provider]  metFORMIN (GLUCOPHAGE) 500 MG tablet Take 500 mg by mouth daily with breakfast.    Yes [provider]  Multiple Vitamin (MULTIVITAMIN WITH MINERALS) TABS tablet Take 1 tablet by mouth daily.   Yes [provider]  phenytoin (DILANTIN) 30 MG ER capsule Take 1 capsule (30 mg total) by mouth 2 (two) times daily. 10/15/16 05/27/18 Yes Arrien, [name], MD  simvastatin (ZOCOR) 80 MG tablet Take 80 mg at bedtime by mouth.  Yes [provider]  thiamine 100 MG tablet Take 1 tablet (100 mg total) daily by mouth. 05/25/17  Yes Barton Dubois, MD  carboxymethylcellulose (REFRESH PLUS) 0.5 % SOLN Place 1 drop into both eyes 2 (two) times daily as needed (Dry eyes).    [provider]  galantamine (RAZADYNE) 8 MG tablet Take 1 tablet (8 mg total) by mouth 2 (two) times daily with a meal. Patient not taking: Reported on 11/15/2017 07/31/13   [name], MD  HYDROcodone-acetaminophen (NORCO) 5-325 MG tablet Take 1 tablet by mouth every 6 (six) hours as needed for severe pain. Patient not taking: Reported on 10/22/2017  09/13/17   Julianne Rice, MD    Allergies  Allergen Reactions  . Shellfish Allergy Rash    FAMILY HISTORY:  family history includes Headache in his mother.  SOCIAL HISTORY:  reports that he has quit smoking. His smoking use included cigars. He has never used smokeless tobacco. He reports that he has current or past drug history. Drug: Marijuana. He reports that he does not drink alcohol.  REVIEW OF SYSTEMS:  POSITIVES IN BOLD Constitutional: Negative for fever, chills, weight loss, malaise/fatigue and diaphoresis.  HENT: Negative for hearing loss, ear pain, nosebleeds, congestion, sore throat, neck pain, tinnitus and ear discharge.   Eyes: Negative for blurred vision, double vision, photophobia, pain, discharge and redness.  Respiratory: Negative for cough, hemoptysis, sputum production, shortness of breath, wheezing and stridor.   Cardiovascular: Negative for chest pain, palpitations, orthopnea, claudication, leg swelling and PND.  Gastrointestinal: Negative for heartburn, nausea, vomiting, abdominal pain, diarrhea, constipation, blood in stool and melena.  Genitourinary: Negative for dysuria, urgency, frequency, hematuria and flank pain.  Musculoskeletal: Negative for myalgias, back pain, joint pain and falls.  Skin: Negative for itching and rash.  Neurological: Negative for dizziness, tingling, tremors, sensory change, speech change, focal weakness, seizures, loss of consciousness, weakness and headaches.  Frequent falls.   Endo/Heme/Allergies: Negative for environmental allergies and polydipsia. Does not bruise/bleed easily.   SUBJECTIVE:   VITAL SIGNS: Temp:  [98 F (36.7 C)-98.8 F (37.1 C)] 98 F (36.7 C) (04/11 1200) Pulse Rate:  [52-123] 69 (04/11 1200) Resp:  [8-24] 20 (04/11 1200) BP: (87-133)/(41-77) 95/51 (04/11 1200) SpO2:  [93 %-100 %] 96 % (04/11 1200) Weight:  [126 lb 8.7 oz (57.4 kg)] 126 lb 8.7 oz (57.4 kg) (04/11 0500)  PHYSICAL EXAMINATION: General:  cachectic male lying in bed watching tv  HEENT: MM pink/moist, poor dentition Neuro: Awake, alert, LUE in cast, frail / generalized deconditioning, speech clear CV: s1s2 rrr, no m/r/g PULM: even/non-labored, bronchial breath sounds on R, referred bronchial sounds on left AS:NKNL, non-tender, bsx4 active  Extremities: warm/dry, no edema  Skin: no rashes or lesions  Recent Labs  Lab 10/27/2017 2052 10/28/17 0001 10/28/17 0319  NA 143 138 133*  K 4.1 3.5 2.9*  CL 104 101 98*  CO2 26 23 22   BUN 9 11 10   CREATININE 0.62 0.72 0.62  GLUCOSE 132* 175* 258*    Recent Labs  Lab 10/21/2017 2052 10/28/17 0319  HGB 12.0* 11.1*  HCT 38.0* 36.2*  WBC 11.1* 9.5  PLT 391 370    Ct Abdomen Pelvis Wo Contrast  Result Date: 11/15/2017 CLINICAL DATA:  Left shoulder and forehead pain after fall this morning. Lung mass. EXAM: CT CHEST, ABDOMEN, AND PELVIS WITHOUT CONTRAST TECHNIQUE: Multidetector CT imaging of the chest, abdomen and pelvis was performed without IV contrast. CONTRAST:  The patient's IV infiltrated during imaging and  the order was changed to CT of the chest, abdomen and pelvis without per ordering clinician. COMPARISON:  PET-CT 05/31/2017 FINDINGS: CT CHEST FINDINGS Cardiovascular: Top-normal size heart with left main and three-vessel coronary arteriosclerosis. Aortic atherosclerosis without aneurysm. The unenhanced pulmonary vasculature is difficult to assess beyond the main pulmonary artery due to soft tissue masses and adenopathy about the right hilum and mediastinum. Mediastinum/Nodes: Interval increase in masslike abnormalities in the mediastinum and right hilum compatible with progression of metastatic adenopathy and/or increase and extension of known right upper lobe mass. Largest lymph node identified is subcarinal at 2.8 cm. Interval increase in right upper paratracheal lymph node measuring 1.1 cm. Exact margins of additional lymph node masses are difficult to delineate on this  unenhanced study. There is luminal narrowing of the distal right mainstem bronchus without occlusion secondary to the right hilar, mediastinal and subcarinal masslike abnormalities. The thyroid gland is unremarkable. The esophagus is not well visualized. Lungs/Pleura: Partially loculated small to moderate right effusion. New pulmonary nodular masslike opacities are noted within the right upper, middle and lower lobes with interval progression in size of spiculated right upper lobe mass now measuring approximately 5.8 x 5.3 x 5 cm. Within the superior segment of the aerated right lower lobe are metastatic nodules measuring up to 2.2 cm. Additional nodules are seen in the right middle and anterior right lower lobe. Left lung remains clear. Nodular thickening along the left major fissure is seen which may represent fluid in the fissure versus an intra fissural mass. This measures 2.2 x 1.2 x 1.7 cm. Peribronchial thickening is noted within both lower lobes. Musculoskeletal: Remote right posterior seventh through ninth rib fractures. No suspicious osseous lesions. Extravasated contrast noted in the soft tissues of the right arm. CT ABDOMEN PELVIS FINDINGS Hepatobiliary: No biliary dilatation. The unenhanced liver is unremarkable. The gallbladder is physiologically distended. Pancreas: No ductal dilatation or inflammation. Spleen: No splenomegaly or mass. Adrenals/Urinary Tract: No adrenal mass. Interpolar 2.5 cm simple right renal cyst. Unremarkable noncontrast appearance of the left kidney. No obstructive uropathy. Marked bladder distention to the umbilicus with the bladder measuring 14.7 x 14.1 x 18.8 cm (volume = 2040 cm^3). Stomach/Bowel: Stomach is within normal limits. Appendix appears normal. No evidence of bowel wall thickening, distention, or inflammatory changes. Vascular/Lymphatic: Moderate aortoiliac and branch vessel atherosclerosis. No aneurysm. No adenopathy. Reproductive: Normal size prostate. Other: No  free air nor free fluid. Musculoskeletal: Degenerative disc disease L5-S1. No aggressive osseous lesions. IMPRESSION: Extravasation of IV contrast into the patient's right arm. Study was flagged for contrast extravasation radiology PA follow-up should the patient be discharged. Chest CT: 1. Interval increase in spiculated right upper lobe mass now measuring approximately 5.8 x 5.3 x 5 cm with progression of metastatic mediastinal and hilar adenopathy as well as development of satellite pulmonary nodules in the right lung. Slight luminal narrowing of the distal right mainstem bronchus due to adenopathy. No occlusion noted however. 2. Ovoid density in the left major fissure may represent a pseudo lesion with fluid in the fissure versus an intra fissural mass measuring 2.2 x 1.2 x 1.7 cm. 3. Loculated moderate right pleural effusion. Abdomen and pelvic CT: 1. Marked urinary distention with volume of approximately 2 L. 2. Simple right interpolar renal cyst measuring 2.5 cm. 3. No apparent evidence of metastatic disease within the abdomen and pelvis given limitations of a noncontrast exam. Electronically Signed   By: Ashley Royalty M.D.   On: 11/15/2017 22:55   Dg Chest 2 View  Result Date: 11/11/2017 CLINICAL DATA:  Cough, history of lung cancer. EXAM: CHEST - 2 VIEW COMPARISON:  05/18/2017 chest CT, CXR 05/18/2017 FINDINGS: Interval increase in size of masslike opacity in the right upper lobe, best appreciated on the sagittal view measuring approximately 4 x 3.5 x 4.8 cm with right paratracheal and perihilar soft tissue prominence concerning for progression of lymphadenopathy. Superimposed adjacent pneumonia is difficult to entirely exclude. Hyperinflated left lung without pulmonary consolidation or dominant mass. Heart size is top-normal. No aortic aneurysm. No suspicious osseous lesions. IMPRESSION: 1. Interval increase in size of right upper lobe posterior masslike abnormality now estimated at 4 x 3.5 x 4.8 cm,  previously estimated at approximately 3.6 cm. 2. Interval increase in soft tissue prominence about the mediastinum and right hilum compatible with metastatic lymphadenopathy. Superimposed adjacent airspace opacities cannot exclude pneumonia and/or areas of atelectasis. Electronically Signed   By: Ashley Royalty M.D.   On: 10/25/2017 19:37   Ct Head Wo Contrast  Result Date: 10/21/2017 CLINICAL DATA:  [place] out of wheelchair today. Left forehead and left shoulder pain. Initial encounter. EXAM: CT HEAD WITHOUT CONTRAST CT CERVICAL SPINE WITHOUT CONTRAST TECHNIQUE: Multidetector CT imaging of the head and cervical spine was performed following the standard protocol without intravenous contrast. Multiplanar CT image reconstructions of the cervical spine were also generated. COMPARISON:  09/21/2017 FINDINGS: CT HEAD FINDINGS Brain: Right MCA territory encephalomalacia is unchanged as is more extensive right temporal lobe encephalomalacia which may be in part postsurgical. There is marked cerebral scratched of there is marked cerebellar atrophy. Periventricular white matter hypodensities are unchanged and nonspecific but compatible with mild chronic small vessel ischemic disease. No acute large territory infarct, intracranial hemorrhage, mass, midline shift, or extra-axial fluid collection is identified. Vascular: Calcified atherosclerosis at the skull base. No hyperdense vessel. Skull: Prior right pterional craniotomy.  No acute fracture. Sinuses/Orbits: The visualized paranasal sinuses and mastoid air cells are clear. The orbits are unremarkable. Other: None. CT CERVICAL SPINE FINDINGS Alignment: Chronic reversal of the normal cervical lordosis. Unchanged grade 1 anterolisthesis of C2 on C3 and C3 on C4. Skull base and vertebrae: No acute fracture or suspicious osseous lesion. Soft tissues and spinal canal: No prevertebral fluid or swelling. No visible canal hematoma. Disc levels: Moderate diffuse cervical disc  degeneration and moderate to severe left greater than right mid upper cervical facet arthrosis resulting in advanced multilevel neural foraminal stenosis. Upper chest: Right pleural effusion. Unchanged 3 mm left apical lung nodule. Other: Moderate calcified atherosclerosis at the carotid bifurcations. IMPRESSION: 1. No evidence of acute intracranial abnormality. 2. Right cerebral hemisphere encephalomalacia. Severe cerebellar atrophy. 3. No acute cervical spine fracture. Electronically Signed   By: Logan Bores M.D.   On: 11/04/2017 19:51   Ct Chest Wo Contrast  Result Date: 11/09/2017 CLINICAL DATA:  Left shoulder and forehead pain after fall this morning. Lung mass. EXAM: CT CHEST, ABDOMEN, AND PELVIS WITHOUT CONTRAST TECHNIQUE: Multidetector CT imaging of the chest, abdomen and pelvis was performed without IV contrast. CONTRAST:  The patient's IV infiltrated during imaging and the order was changed to CT of the chest, abdomen and pelvis without per ordering clinician. COMPARISON:  PET-CT 05/31/2017 FINDINGS: CT CHEST FINDINGS Cardiovascular: Top-normal size heart with left main and three-vessel coronary arteriosclerosis. Aortic atherosclerosis without aneurysm. The unenhanced pulmonary vasculature is difficult to assess beyond the main pulmonary artery due to soft tissue masses and adenopathy about the right hilum and mediastinum. Mediastinum/Nodes: Interval increase in masslike abnormalities in the mediastinum and  right hilum compatible with progression of metastatic adenopathy and/or increase and extension of known right upper lobe mass. Largest lymph node identified is subcarinal at 2.8 cm. Interval increase in right upper paratracheal lymph node measuring 1.1 cm. Exact margins of additional lymph node masses are difficult to delineate on this unenhanced study. There is luminal narrowing of the distal right mainstem bronchus without occlusion secondary to the right hilar, mediastinal and subcarinal masslike  abnormalities. The thyroid gland is unremarkable. The esophagus is not well visualized. Lungs/Pleura: Partially loculated small to moderate right effusion. New pulmonary nodular masslike opacities are noted within the right upper, middle and lower lobes with interval progression in size of spiculated right upper lobe mass now measuring approximately 5.8 x 5.3 x 5 cm. Within the superior segment of the aerated right lower lobe are metastatic nodules measuring up to 2.2 cm. Additional nodules are seen in the right middle and anterior right lower lobe. Left lung remains clear. Nodular thickening along the left major fissure is seen which may represent fluid in the fissure versus an intra fissural mass. This measures 2.2 x 1.2 x 1.7 cm. Peribronchial thickening is noted within both lower lobes. Musculoskeletal: Remote right posterior seventh through ninth rib fractures. No suspicious osseous lesions. Extravasated contrast noted in the soft tissues of the right arm. CT ABDOMEN PELVIS FINDINGS Hepatobiliary: No biliary dilatation. The unenhanced liver is unremarkable. The gallbladder is physiologically distended. Pancreas: No ductal dilatation or inflammation. Spleen: No splenomegaly or mass. Adrenals/Urinary Tract: No adrenal mass. Interpolar 2.5 cm simple right renal cyst. Unremarkable noncontrast appearance of the left kidney. No obstructive uropathy. Marked bladder distention to the umbilicus with the bladder measuring 14.7 x 14.1 x 18.8 cm (volume = 2040 cm^3). Stomach/Bowel: Stomach is within normal limits. Appendix appears normal. No evidence of bowel wall thickening, distention, or inflammatory changes. Vascular/Lymphatic: Moderate aortoiliac and branch vessel atherosclerosis. No aneurysm. No adenopathy. Reproductive: Normal size prostate. Other: No free air nor free fluid. Musculoskeletal: Degenerative disc disease L5-S1. No aggressive osseous lesions. IMPRESSION: Extravasation of IV contrast into the patient's  right arm. Study was flagged for contrast extravasation radiology PA follow-up should the patient be discharged. Chest CT: 1. Interval increase in spiculated right upper lobe mass now measuring approximately 5.8 x 5.3 x 5 cm with progression of metastatic mediastinal and hilar adenopathy as well as development of satellite pulmonary nodules in the right lung. Slight luminal narrowing of the distal right mainstem bronchus due to adenopathy. No occlusion noted however. 2. Ovoid density in the left major fissure may represent a pseudo lesion with fluid in the fissure versus an intra fissural mass measuring 2.2 x 1.2 x 1.7 cm. 3. Loculated moderate right pleural effusion. Abdomen and pelvic CT: 1. Marked urinary distention with volume of approximately 2 L. 2. Simple right interpolar renal cyst measuring 2.5 cm. 3. No apparent evidence of metastatic disease within the abdomen and pelvis given limitations of a noncontrast exam. Electronically Signed   By: Ashley Royalty M.D.   On: 10/30/2017 22:55   Ct Cervical Spine Wo Contrast  Result Date: 11/16/2017 CLINICAL DATA:  [place] out of wheelchair today. Left forehead and left shoulder pain. Initial encounter. EXAM: CT HEAD WITHOUT CONTRAST CT CERVICAL SPINE WITHOUT CONTRAST TECHNIQUE: Multidetector CT imaging of the head and cervical spine was performed following the standard protocol without intravenous contrast. Multiplanar CT image reconstructions of the cervical spine were also generated. COMPARISON:  09/21/2017 FINDINGS: CT HEAD FINDINGS Brain: Right MCA territory encephalomalacia is unchanged  as is more extensive right temporal lobe encephalomalacia which may be in part postsurgical. There is marked cerebral scratched of there is marked cerebellar atrophy. Periventricular white matter hypodensities are unchanged and nonspecific but compatible with mild chronic small vessel ischemic disease. No acute large territory infarct, intracranial hemorrhage, mass, midline shift,  or extra-axial fluid collection is identified. Vascular: Calcified atherosclerosis at the skull base. No hyperdense vessel. Skull: Prior right pterional craniotomy.  No acute fracture. Sinuses/Orbits: The visualized paranasal sinuses and mastoid air cells are clear. The orbits are unremarkable. Other: None. CT CERVICAL SPINE FINDINGS Alignment: Chronic reversal of the normal cervical lordosis. Unchanged grade 1 anterolisthesis of C2 on C3 and C3 on C4. Skull base and vertebrae: No acute fracture or suspicious osseous lesion. Soft tissues and spinal canal: No prevertebral fluid or swelling. No visible canal hematoma. Disc levels: Moderate diffuse cervical disc degeneration and moderate to severe left greater than right mid upper cervical facet arthrosis resulting in advanced multilevel neural foraminal stenosis. Upper chest: Right pleural effusion. Unchanged 3 mm left apical lung nodule. Other: Moderate calcified atherosclerosis at the carotid bifurcations. IMPRESSION: 1. No evidence of acute intracranial abnormality. 2. Right cerebral hemisphere encephalomalacia. Severe cerebellar atrophy. 3. No acute cervical spine fracture. Electronically Signed   By: Logan Bores M.D.   On: 10/27/2017 19:51      SIGNIFICANT EVENTS  4/09  Admit after falling out of wheelchair, [facility] 4/11 PCCM consulted regarding lung mass  STUDIES CT Head / Neck 4/9 >> negative for acute intracranial abnormality or cervical spine fracture, right cerebral hemisphere encephalomalacia, severe cerebellar atrophy CT Chest 4/9 >> interval increase in spiculated RUL mass now measuring 5.8 x5.3 x 5cm with progression of metastatic mediastinal and hilar adenopathy as well as development of satellite pulmonary nodules in the right lung.  Slight luminal narrowing of the distal right mainstem bronchus due to adenopathy without occlusion.  Ovoid density in the left major fissure may represent a pseudo lesion with fluid in the fissure vs an intra  fissural mass measuring 2.2x1.2x1.7 cm.  Loculated moderate R pleural effusion.  CT Abd/Pelvis 4/9 >> marked urinary distention (approx 2L), no apparent evidence of metastatic disease within the abd/pelvis   CULTURES UC 4/9 >> less than 10K colonies of insignificant growth Sputum 4/10 >>  ANTIBIOTICS     ASSESSMENT / PLAN:  Discussion:  67 y/o M admitted after falling out his wheelchair.  Found to have AF with RVR. Known hx of lung mass and did not follow up despite being set up for outpatient bronchoscopy.  Current imaging notable for increase in size of RUL spiculated mass,  metastatic mediastinal disease, hilar adenopathy and satellite pulmonary nodules.  He has unfortunately been lost to follow up.  The patient reports he would want his pastor (Rev. Floyde Parkins [phone]) to make decisions for him in the event he could not.  He has poor insight into his current situation and what seems to be limited health care literacy.  In addition, he reports a limited support system.  Despite this, he states he would want treatment for malignancy if it is indeed cancer.     RUL Spiculated Mass - with disease progression, highly worrisome for advanced stage cancer.  I am very concerned that if he leaves, he will not follow up for biopsy or treatment.    Weight Loss / Moderate to Severe Malnutrition   Plan: Pulmonary hygiene - IS, mobilize  Follow intermittent CXR  Monitor fever curve / WBC trend  Will proceed with R sided thoracentesis 4/12 > take full volume and spin down for cell block.  This will hopefully allow enough material for genetic testing as well and avoid FOB.   If the above is negative, will proceed with bronchoscopy Recommend Palliative Care consult as soon as able to assist patient with working through these decisions.  Recommend PT evaluation / SW consult for likely SNF need   Noe Gens, NP-C [facility] Pgr: [phone] or if no answer  [phone] 10/28/2017, 3:35 PM

## 2017-10-28 NOTE — Progress Notes (Signed)
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 David  Martinique M.D.   On: 10/31/2017 11:20     LOS: 1 day   Oren Binet, MD  [facility] Pager:[phone]  If 7PM-7AM, please contact night-coverage [email or web address] Password [facility] 10/28/2017, 12:46 PM

## 2017-10-28 NOTE — Progress Notes (Signed)
Initial Nutrition Assessment  DOCUMENTATION CODES:   Severe malnutrition in context of chronic illness, Underweight  INTERVENTION:  - Continue Ensure Enlive BID, each supplement provides 350 kcal and 20 grams of protein. - Continue to encourage PO intakes.   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  GOAL:   Patient will meet greater than or equal to 90% of their needs  MONITOR:   PO intake, Supplement acceptance, Weight trends, Labs  REASON FOR ASSESSMENT:   Low Braden  ASSESSMENT:   67 y.o. male with history of alcohol use, seizure disorder, DM-2, hypertension resented to the ED following a mechanical fall, found to be in A. fib with RVR and admitted to the hospitalist service.  Per chart review, pt consumed 50% of breakfast yesterday and 75% of breakfast this AM which pt reports included an omelet. Pt states that he has some abdominal pain but no nausea. He had consumed a few sips of an Ensure at the time of RD visit earlier this afternoon. Pt reports that he receives Ensure supplements from the [place] and that he typically drinks them twice a day (one with breakfast and one in the evening). Pt often hard to redirect and gets off topic very easily.   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 He states poor appetite for the past few weeks and that he has lost weight from his UBW of 139 lbs. . Per chart review, pt has lost 14 lbs (10% body weight) in the past 5 months; this is significant for time frame.   Pt reports 20 year hx of drinking beer but unable to determine the amount pt drinks/drank per day and if he is still actively drinking.   Medications reviewed; 200 mg Colace BID, 1 mg oral folic acid/day, sliding scale Novolog, 1 g IV Mg sulfate x1 run today, 2 g IV Mg  sulfate x1 run today, multivitamin with minerals, 10 mEq IV KCl x2 runs today, 40 mEq oral KCl x2 today, 100 mg oral thiamine/day. Labs reviewed; CBGs: 167 and 183 mg/dL today,  Na: 133 mmol/L, K: 2.9 mmol/L, Cl: 98 mmol/L, Ca: 8.8 mg/dL.    NUTRITION - FOCUSED PHYSICAL EXAM:    Most Recent Value  Orbital Region  Unable to assess  Upper Arm Region  Severe depletion  Thoracic and Lumbar Region  Severe depletion  Buccal Region  Unable to assess  Temple Region  Severe depletion  Clavicle Bone Region  Severe depletion  Clavicle and Acromion Bone Region  Severe depletion  Scapular Bone Region  Unable to assess  Dorsal Hand  Moderate depletion  Patellar Region  Moderate depletion  Anterior Thigh Region  Severe depletion  Posterior Calf Region  Severe depletion  Edema (RD Assessment)  Mild  Hair  Reviewed  Eyes  Reviewed  Mouth  Reviewed  Skin  Reviewed  Nails  Reviewed       Diet Order:  Diet Heart Room service appropriate? Yes; Fluid consistency: Thin  EDUCATION NEEDS:   No education needs have been identified at this time  Skin:  Skin Assessment: Reviewed RN Assessment  Last BM:  4/9  Height:   Ht Readings from Last 1 Encounters:  10/27/17 6\' 1"  (1.854 m)    Weight:   Wt Readings from Last 1 Encounters:  10/28/17 126 lb 8.7 oz (57.4 kg)    Ideal Body Weight:  83.64 kg  BMI:  Body mass index is 16.7 kg/m.  Estimated Nutritional Needs:   Kcal:  7035-0093 (33-36 kcal/kg)  Protein:  80-92 grams (1.4-1.6 grams/kg)  Fluid:  >/= 2 L/day      Jarome Matin, MS, RD, LDN, [facility] Inpatient Clinical Dietitian Pager # [phone] After hours/weekend pager # [phone]

## 2017-10-28 NOTE — Telephone Encounter (Signed)
I saw him

## 2017-10-28 NOTE — Progress Notes (Signed)
ANTICOAGULATION CONSULT NOTE - Follow Up Consult  Pharmacy Consult for Heparin Indication: atrial fibrillation  Allergies  Allergen Reactions  . Shellfish Allergy Rash    Patient Measurements: Height: 6\' 1"  (185.4 cm) Weight: 121 lb 0.5 oz (54.9 kg) IBW/kg (Calculated) : 79.9 Heparin Dosing Weight:   Vital Signs: Temp: 98.8 F (37.1 C) (04/11 0358) Temp Source: Oral (04/11 0358) BP: 97/53 (04/11 0400) Pulse Rate: 53 (04/11 0300)  Labs: Recent Labs    11/09/2017 2052 10/27/17 1043 10/27/17 1532 10/28/17 0001 10/28/17 0319  HGB 12.0*  --   --   --  11.1*  HCT 38.0*  --   --   --  36.2*  PLT 391  --   --   --  370  APTT  --  29  --   --   --   LABPROT  --  14.2  --   --   --   INR  --  1.11  --   --   --   HEPARINUNFRC  --   --  <0.10* <0.10*  --   CREATININE 0.62  --   --  0.72 0.62    Estimated Creatinine Clearance: 68.6 mL/min (by C-G formula based on SCr of 0.62 mg/dL).   Medications:  Infusions:  . diltiazem (CARDIZEM) infusion 15 mg/hr (10/28/17 0330)  . heparin 1,250 Units/hr (10/28/17 0438)    Assessment: Patient with low heparin level.   No heparin issues per RN.  Goal of Therapy:  Heparin level 0.3-0.7 units/ml Monitor platelets by anticoagulation protocol: Yes   Plan:  Increase heparin to 1250 units/hr Recheck level at [place] 10/28/2017,5:05 AM

## 2017-10-28 NOTE — Telephone Encounter (Signed)
Message left on triage phone. They are needing clarification on orders for pt to have another elbow splint.

## 2017-10-28 NOTE — Consult Note (Addendum)
ORTHOPAEDIC CONSULTATION  REQUESTING PHYSICIAN: Jonetta Osgood, MD  Chief Complaint: Left elbow fracture  HPI: Wyatt Salas is a 67 y.o. male with h/o DM, TBI, alcoholism and noncompliance.  He comes in with recent falls.  Ortho consulted for left distal humerus fracture that he originally presented with 1 month ago.  He was placed in a splint at that time.  He has not had any follow up since then.  Past Medical History:  Diagnosis Date  . Diabetes mellitus without complication ([place])    Type II  . Diastolic dysfunction    a. ECHO 07/2013 EF 55-60% and grade 1 diastolic dysfunction  . NSTEMI (non-ST elevated myocardial infarction) ([place])    problem list 01/2014  . Rhabdomyolysis 01/2014  . Seizures ([place])   . Syncope 01/2014  . Traumatic brain injury [facility])    Past Surgical History:  Procedure Laterality Date  . CRANIOTOMY     Social History   Socioeconomic History  . Marital status: Divorced    Spouse name: Not on file  . Number of children: Not on file  . Years of education: Not on file  . Highest education level: Not on file  Occupational History  . Not on file  Social Needs  . Financial resource strain: Not on file  . Food insecurity:    Worry: Not on file    Inability: Not on file  . Transportation needs:    Medical: Not on file    Non-medical: Not on file  Tobacco Use  . Smoking status: Former Smoker    Types: Cigars  . Smokeless tobacco: Never Used  Substance and Sexual Activity  . Alcohol use: No    Frequency: Never  . Drug use: Yes    Types: Marijuana  . Sexual activity: Not on file  Lifestyle  . Physical activity:    Days per week: Not on file    Minutes per session: Not on file  . Stress: Not on file  Relationships  . Social connections:    Talks on phone: Not on file    Gets together: Not on file    Attends religious service: Not on file    Active member of club or organization: Not on file    Attends meetings of clubs or  organizations: Not on file    Relationship status: Not on file  Other Topics Concern  . Not on file  Social History Narrative  . Not on file   Family History  Problem Relation Age of Onset  . Headache Mother   . Diabetes Neg Hx   . Stroke Neg Hx    - negative except otherwise stated in the family history section Allergies  Allergen Reactions  . Shellfish Allergy Rash   Prior to Admission medications   Medication Sig Start Date End Date Taking? Authorizing Provider  acetaminophen (TYLENOL) 500 MG tablet Take 1 tablet (500 mg total) by mouth every 6 (six) hours as needed. Patient taking differently: Take 500 mg by mouth 2 (two) times daily.  10/05/17  Yes Burky, Lanelle Bal B, NP  docusate sodium (COLACE) 100 MG capsule Take 200 mg by mouth 2 (two) times daily.   Yes [provider]  ENSURE PLUS (ENSURE PLUS) LIQD Take 237 mLs by mouth 2 (two) times daily between meals.   Yes [provider]  folic acid (FOLVITE) 1 MG tablet Take 1 tablet (1 mg total) daily by mouth. 05/25/17  Yes Barton Dubois, MD  lacosamide (VIMPAT)  50 MG TABS tablet Take 100 mg by mouth 2 (two) times daily.   Yes [provider]  levETIRAcetam (KEPPRA) 750 MG tablet Take 1,500 mg by mouth 2 (two) times daily.   Yes [provider]  lisinopril (PRINIVIL,ZESTRIL) 10 MG tablet Take 10 mg by mouth 2 (two) times daily.   Yes [provider]  metFORMIN (GLUCOPHAGE) 500 MG tablet Take 500 mg by mouth daily with breakfast.    Yes [provider]  Multiple Vitamin (MULTIVITAMIN WITH MINERALS) TABS tablet Take 1 tablet by mouth daily.   Yes [provider]  phenytoin (DILANTIN) 30 MG ER capsule Take 1 capsule (30 mg total) by mouth 2 (two) times daily. 10/15/16 05/27/18 Yes Arrien, [name], MD  simvastatin (ZOCOR) 80 MG tablet Take 80 mg at bedtime by mouth.    Yes [provider]  thiamine 100 MG tablet Take 1 tablet (100 mg total) daily by mouth.  05/25/17  Yes Barton Dubois, MD  carboxymethylcellulose (REFRESH PLUS) 0.5 % SOLN Place 1 drop into both eyes 2 (two) times daily as needed (Dry eyes).    [provider]  galantamine (RAZADYNE) 8 MG tablet Take 1 tablet (8 mg total) by mouth 2 (two) times daily with a meal. Patient not taking: Reported on 11/10/2017 07/31/13   [name], MD  HYDROcodone-acetaminophen (NORCO) 5-325 MG tablet Take 1 tablet by mouth every 6 (six) hours as needed for severe pain. Patient not taking: Reported on 10/23/2017 09/13/17   Julianne Rice, MD   Ct Abdomen Pelvis Wo Contrast  Result Date: 10/20/2017 CLINICAL DATA:  Left shoulder and forehead pain after fall this morning. Lung mass. EXAM: CT CHEST, ABDOMEN, AND PELVIS WITHOUT CONTRAST TECHNIQUE: Multidetector CT imaging of the chest, abdomen and pelvis was performed without IV contrast. CONTRAST:  The patient's IV infiltrated during imaging and the order was changed to CT of the chest, abdomen and pelvis without per ordering clinician. COMPARISON:  PET-CT 05/31/2017 FINDINGS: CT CHEST FINDINGS Cardiovascular: Top-normal size heart with left main and three-vessel coronary arteriosclerosis. Aortic atherosclerosis without aneurysm. The unenhanced pulmonary vasculature is difficult to assess beyond the main pulmonary artery due to soft tissue masses and adenopathy about the right hilum and mediastinum. Mediastinum/Nodes: Interval increase in masslike abnormalities in the mediastinum and right hilum compatible with progression of metastatic adenopathy and/or increase and extension of known right upper lobe mass. Largest lymph node identified is subcarinal at 2.8 cm. Interval increase in right upper paratracheal lymph node measuring 1.1 cm. Exact margins of additional lymph node masses are difficult to delineate on this unenhanced study. There is luminal narrowing of the distal right mainstem bronchus without occlusion secondary to the right hilar, mediastinal and  subcarinal masslike abnormalities. The thyroid gland is unremarkable. The esophagus is not well visualized. Lungs/Pleura: Partially loculated small to moderate right effusion. New pulmonary nodular masslike opacities are noted within the right upper, middle and lower lobes with interval progression in size of spiculated right upper lobe mass now measuring approximately 5.8 x 5.3 x 5 cm. Within the superior segment of the aerated right lower lobe are metastatic nodules measuring up to 2.2 cm. Additional nodules are seen in the right middle and anterior right lower lobe. Left lung remains clear. Nodular thickening along the left major fissure is seen which may represent fluid in the fissure versus an intra fissural mass. This measures 2.2 x 1.2 x 1.7 cm. Peribronchial thickening is noted within both lower lobes. Musculoskeletal: Remote right posterior seventh  through ninth rib fractures. No suspicious osseous lesions. Extravasated contrast noted in the soft tissues of the right arm. CT ABDOMEN PELVIS FINDINGS Hepatobiliary: No biliary dilatation. The unenhanced liver is unremarkable. The gallbladder is physiologically distended. Pancreas: No ductal dilatation or inflammation. Spleen: No splenomegaly or mass. Adrenals/Urinary Tract: No adrenal mass. Interpolar 2.5 cm simple right renal cyst. Unremarkable noncontrast appearance of the left kidney. No obstructive uropathy. Marked bladder distention to the umbilicus with the bladder measuring 14.7 x 14.1 x 18.8 cm (volume = 2040 cm^3). Stomach/Bowel: Stomach is within normal limits. Appendix appears normal. No evidence of bowel wall thickening, distention, or inflammatory changes. Vascular/Lymphatic: Moderate aortoiliac and branch vessel atherosclerosis. No aneurysm. No adenopathy. Reproductive: Normal size prostate. Other: No free air nor free fluid. Musculoskeletal: Degenerative disc disease L5-S1. No aggressive osseous lesions. IMPRESSION: Extravasation of IV contrast  into the patient's right arm. Study was flagged for contrast extravasation radiology PA follow-up should the patient be discharged. Chest CT: 1. Interval increase in spiculated right upper lobe mass now measuring approximately 5.8 x 5.3 x 5 cm with progression of metastatic mediastinal and hilar adenopathy as well as development of satellite pulmonary nodules in the right lung. Slight luminal narrowing of the distal right mainstem bronchus due to adenopathy. No occlusion noted however. 2. Ovoid density in the left major fissure may represent a pseudo lesion with fluid in the fissure versus an intra fissural mass measuring 2.2 x 1.2 x 1.7 cm. 3. Loculated moderate right pleural effusion. Abdomen and pelvic CT: 1. Marked urinary distention with volume of approximately 2 L. 2. Simple right interpolar renal cyst measuring 2.5 cm. 3. No apparent evidence of metastatic disease within the abdomen and pelvis given limitations of a noncontrast exam. Electronically Signed   By: Ashley Royalty M.D.   On: 10/25/2017 22:55   Dg Chest 2 View  Result Date: 10/25/2017 CLINICAL DATA:  Cough, history of lung cancer. EXAM: CHEST - 2 VIEW COMPARISON:  05/18/2017 chest CT, CXR 05/18/2017 FINDINGS: Interval increase in size of masslike opacity in the right upper lobe, best appreciated on the sagittal view measuring approximately 4 x 3.5 x 4.8 cm with right paratracheal and perihilar soft tissue prominence concerning for progression of lymphadenopathy. Superimposed adjacent pneumonia is difficult to entirely exclude. Hyperinflated left lung without pulmonary consolidation or dominant mass. Heart size is top-normal. No aortic aneurysm. No suspicious osseous lesions. IMPRESSION: 1. Interval increase in size of right upper lobe posterior masslike abnormality now estimated at 4 x 3.5 x 4.8 cm, previously estimated at approximately 3.6 cm. 2. Interval increase in soft tissue prominence about the mediastinum and right hilum compatible with  metastatic lymphadenopathy. Superimposed adjacent airspace opacities cannot exclude pneumonia and/or areas of atelectasis. Electronically Signed   By: Ashley Royalty M.D.   On: 11/15/2017 19:37   Ct Head Wo Contrast  Result Date: 10/25/2017 CLINICAL DATA:  [address] [address] out of wheelchair today. Left forehead and left shoulder pain. Initial encounter. EXAM: CT HEAD WITHOUT CONTRAST CT CERVICAL SPINE WITHOUT CONTRAST TECHNIQUE: Multidetector CT imaging of the head and cervical spine was performed following the standard protocol without intravenous contrast. Multiplanar CT image reconstructions of the cervical spine were also generated. COMPARISON:  09/21/2017 FINDINGS: CT HEAD FINDINGS Brain: Right MCA territory encephalomalacia is unchanged as is more extensive right temporal lobe encephalomalacia which may be in part postsurgical. There is marked cerebral scratched of there is marked cerebellar atrophy. Periventricular white matter hypodensities are unchanged and nonspecific but compatible with mild chronic small  vessel ischemic disease. No acute large territory infarct, intracranial hemorrhage, mass, midline shift, or extra-axial fluid collection is identified. Vascular: Calcified atherosclerosis at the skull base. No hyperdense vessel. Skull: Prior right pterional craniotomy.  No acute fracture. Sinuses/Orbits: The visualized paranasal sinuses and mastoid air cells are clear. The orbits are unremarkable. Other: None. CT CERVICAL SPINE FINDINGS Alignment: Chronic reversal of the normal cervical lordosis. Unchanged grade 1 anterolisthesis of C2 on C3 and C3 on C4. Skull base and vertebrae: No acute fracture or suspicious osseous lesion. Soft tissues and spinal canal: No prevertebral fluid or swelling. No visible canal hematoma. Disc levels: Moderate diffuse cervical disc degeneration and moderate to severe left greater than right mid upper cervical facet arthrosis resulting in advanced multilevel neural foraminal stenosis.  Upper chest: Right pleural effusion. Unchanged 3 mm left apical lung nodule. Other: Moderate calcified atherosclerosis at the carotid bifurcations. IMPRESSION: 1. No evidence of acute intracranial abnormality. 2. Right cerebral hemisphere encephalomalacia. Severe cerebellar atrophy. 3. No acute cervical spine fracture. Electronically Signed   By: Logan Bores M.D.   On: 10/19/2017 19:51   Ct Chest Wo Contrast  Result Date: 11/04/2017 CLINICAL DATA:  Left shoulder and forehead pain after fall this morning. Lung mass. EXAM: CT CHEST, ABDOMEN, AND PELVIS WITHOUT CONTRAST TECHNIQUE: Multidetector CT imaging of the chest, abdomen and pelvis was performed without IV contrast. CONTRAST:  The patient's IV infiltrated during imaging and the order was changed to CT of the chest, abdomen and pelvis without per ordering clinician. COMPARISON:  PET-CT 05/31/2017 FINDINGS: CT CHEST FINDINGS Cardiovascular: Top-normal size heart with left main and three-vessel coronary arteriosclerosis. Aortic atherosclerosis without aneurysm. The unenhanced pulmonary vasculature is difficult to assess beyond the main pulmonary artery due to soft tissue masses and adenopathy about the right hilum and mediastinum. Mediastinum/Nodes: Interval increase in masslike abnormalities in the mediastinum and right hilum compatible with progression of metastatic adenopathy and/or increase and extension of known right upper lobe mass. Largest lymph node identified is subcarinal at 2.8 cm. Interval increase in right upper paratracheal lymph node measuring 1.1 cm. Exact margins of additional lymph node masses are difficult to delineate on this unenhanced study. There is luminal narrowing of the distal right mainstem bronchus without occlusion secondary to the right hilar, mediastinal and subcarinal masslike abnormalities. The thyroid gland is unremarkable. The esophagus is not well visualized. Lungs/Pleura: Partially loculated small to moderate right  effusion. New pulmonary nodular masslike opacities are noted within the right upper, middle and lower lobes with interval progression in size of spiculated right upper lobe mass now measuring approximately 5.8 x 5.3 x 5 cm. Within the superior segment of the aerated right lower lobe are metastatic nodules measuring up to 2.2 cm. Additional nodules are seen in the right middle and anterior right lower lobe. Left lung remains clear. Nodular thickening along the left major fissure is seen which may represent fluid in the fissure versus an intra fissural mass. This measures 2.2 x 1.2 x 1.7 cm. Peribronchial thickening is noted within both lower lobes. Musculoskeletal: Remote right posterior seventh through ninth rib fractures. No suspicious osseous lesions. Extravasated contrast noted in the soft tissues of the right arm. CT ABDOMEN PELVIS FINDINGS Hepatobiliary: No biliary dilatation. The unenhanced liver is unremarkable. The gallbladder is physiologically distended. Pancreas: No ductal dilatation or inflammation. Spleen: No splenomegaly or mass. Adrenals/Urinary Tract: No adrenal mass. Interpolar 2.5 cm simple right renal cyst. Unremarkable noncontrast appearance of the left kidney. No obstructive uropathy. Marked bladder distention to  the umbilicus with the bladder measuring 14.7 x 14.1 x 18.8 cm (volume = 2040 cm^3). Stomach/Bowel: Stomach is within normal limits. Appendix appears normal. No evidence of bowel wall thickening, distention, or inflammatory changes. Vascular/Lymphatic: Moderate aortoiliac and branch vessel atherosclerosis. No aneurysm. No adenopathy. Reproductive: Normal size prostate. Other: No free air nor free fluid. Musculoskeletal: Degenerative disc disease L5-S1. No aggressive osseous lesions. IMPRESSION: Extravasation of IV contrast into the patient's right arm. Study was flagged for contrast extravasation radiology PA follow-up should the patient be discharged. Chest CT: 1. Interval increase in  spiculated right upper lobe mass now measuring approximately 5.8 x 5.3 x 5 cm with progression of metastatic mediastinal and hilar adenopathy as well as development of satellite pulmonary nodules in the right lung. Slight luminal narrowing of the distal right mainstem bronchus due to adenopathy. No occlusion noted however. 2. Ovoid density in the left major fissure may represent a pseudo lesion with fluid in the fissure versus an intra fissural mass measuring 2.2 x 1.2 x 1.7 cm. 3. Loculated moderate right pleural effusion. Abdomen and pelvic CT: 1. Marked urinary distention with volume of approximately 2 L. 2. Simple right interpolar renal cyst measuring 2.5 cm. 3. No apparent evidence of metastatic disease within the abdomen and pelvis given limitations of a noncontrast exam. Electronically Signed   By: Ashley Royalty M.D.   On: 11/06/2017 22:55   Ct Cervical Spine Wo Contrast  Result Date: 11/01/2017 CLINICAL DATA:  [address] out of wheelchair today. Left forehead and left shoulder pain. Initial encounter. EXAM: CT HEAD WITHOUT CONTRAST CT CERVICAL SPINE WITHOUT CONTRAST TECHNIQUE: Multidetector CT imaging of the head and cervical spine was performed following the standard protocol without intravenous contrast. Multiplanar CT image reconstructions of the cervical spine were also generated. COMPARISON:  09/21/2017 FINDINGS: CT HEAD FINDINGS Brain: Right MCA territory encephalomalacia is unchanged as is more extensive right temporal lobe encephalomalacia which may be in part postsurgical. There is marked cerebral scratched of there is marked cerebellar atrophy. Periventricular white matter hypodensities are unchanged and nonspecific but compatible with mild chronic small vessel ischemic disease. No acute large territory infarct, intracranial hemorrhage, mass, midline shift, or extra-axial fluid collection is identified. Vascular: Calcified atherosclerosis at the skull base. No hyperdense vessel. Skull: Prior right  pterional craniotomy.  No acute fracture. Sinuses/Orbits: The visualized paranasal sinuses and mastoid air cells are clear. The orbits are unremarkable. Other: None. CT CERVICAL SPINE FINDINGS Alignment: Chronic reversal of the normal cervical lordosis. Unchanged grade 1 anterolisthesis of C2 on C3 and C3 on C4. Skull base and vertebrae: No acute fracture or suspicious osseous lesion. Soft tissues and spinal canal: No prevertebral fluid or swelling. No visible canal hematoma. Disc levels: Moderate diffuse cervical disc degeneration and moderate to severe left greater than right mid upper cervical facet arthrosis resulting in advanced multilevel neural foraminal stenosis. Upper chest: Right pleural effusion. Unchanged 3 mm left apical lung nodule. Other: Moderate calcified atherosclerosis at the carotid bifurcations. IMPRESSION: 1. No evidence of acute intracranial abnormality. 2. Right cerebral hemisphere encephalomalacia. Severe cerebellar atrophy. 3. No acute cervical spine fracture. Electronically Signed   By: Logan Bores M.D.   On: 11/06/2017 19:51   - pertinent xrays, CT, MRI studies were reviewed and independently interpreted  Positive ROS: All other systems have been reviewed and were otherwise negative with the exception of those mentioned in the HPI and as above.  Physical Exam: General: Alert, no acute distress Cardiovascular: No pedal edema Respiratory: No cyanosis, no  use of accessory musculature GI: No organomegaly, abdomen is soft and non-tender Skin: No lesions in the area of chief complaint Neurologic: Sensation intact distally Psychiatric: Patient is competent for consent with normal mood and affect Lymphatic: No axillary or cervical lymphadenopathy  MUSCULOSKELETAL:  - compartment soft - NVI although he does have weak wrist extension - 2+ pulses  Assessment: Left minimally displaced distal humerus fracture  Plan: - splint removed - OT needed for ROM as he's been in splint  for 1 month now - repeat xrays of left elbow ordered - anticipate nonop treatment - WBAT LUE  Thank you for the consult and the opportunity to see Wyatt Salas. Eduard Roux, MD [place] 6:56 PM

## 2017-10-29 ENCOUNTER — Inpatient Hospital Stay (HOSPITAL_COMMUNITY): Payer: Medicare HMO

## 2017-10-29 ENCOUNTER — Encounter (HOSPITAL_COMMUNITY): Payer: Self-pay

## 2017-10-29 DIAGNOSIS — Z7189 Other specified counseling: Secondary | ICD-10-CM

## 2017-10-29 DIAGNOSIS — J90 Pleural effusion, not elsewhere classified: Secondary | ICD-10-CM

## 2017-10-29 DIAGNOSIS — Z515 Encounter for palliative care: Secondary | ICD-10-CM

## 2017-10-29 LAB — LACTATE DEHYDROGENASE, PLEURAL OR PERITONEAL FLUID: LD FL: 235 U/L — AB (ref 3–23)

## 2017-10-29 LAB — BASIC METABOLIC PANEL
Anion gap: 9 (ref 5–15)
BUN: 8 mg/dL (ref 6–20)
CHLORIDE: 101 mmol/L (ref 101–111)
CO2: 25 mmol/L (ref 22–32)
Calcium: 9 mg/dL (ref 8.9–10.3)
Creatinine, Ser: 0.54 mg/dL — ABNORMAL LOW (ref 0.61–1.24)
GFR calc non Af Amer: >60 mL/min (ref >60)
Glucose, Bld: 138 mg/dL — ABNORMAL HIGH (ref 65–99)
POTASSIUM: 3.8 mmol/L (ref 3.5–5.1)
Sodium: 135 mmol/L (ref 135–145)

## 2017-10-29 LAB — GLUCOSE, CAPILLARY
GLUCOSE-CAPILLARY: 125 mg/dL — AB (ref 65–99)
GLUCOSE-CAPILLARY: 141 mg/dL — AB (ref 65–99)
Glucose-Capillary: 139 mg/dL — ABNORMAL HIGH (ref 65–99)
Glucose-Capillary: 152 mg/dL — ABNORMAL HIGH (ref 65–99)
Glucose-Capillary: 125 mg/dL — ABNORMAL HIGH (ref 65–99)

## 2017-10-29 LAB — BODY FLUID CELL COUNT WITH DIFFERENTIAL
Lymphs, Fluid: 68 %
Monocyte-Macrophage-Serous Fluid: 14 % — ABNORMAL LOW (ref 50–90)
Neutrophil Count, Fluid: 18 % (ref 0–25)
Total Nucleated Cell Count, Fluid: 263 cu mm (ref 0–1000)

## 2017-10-29 LAB — CBC
HEMATOCRIT: 34.8 % — AB (ref 39.0–52.0)
Hemoglobin: 10.8 g/dL — ABNORMAL LOW (ref 13.0–17.0)
MCH: 25.5 pg — AB (ref 26.0–34.0)
MCHC: 31 g/dL (ref 30.0–36.0)
MCV: 82.3 fL (ref 78.0–100.0)
Platelets: 346 10*3/uL (ref 150–400)
RBC: 4.23 MIL/uL (ref 4.22–5.81)
RDW: 15.2 % (ref 11.5–15.5)
WBC: 9.1 10*3/uL (ref 4.0–10.5)

## 2017-10-29 LAB — PROTEIN, PLEURAL OR PERITONEAL FLUID: Total protein, fluid: <3 g/dL

## 2017-10-29 LAB — MAGNESIUM: Magnesium: 1.9 mg/dL (ref 1.7–2.4)

## 2017-10-29 LAB — GLUCOSE, PLEURAL OR PERITONEAL FLUID: GLUCOSE FL: 150 mg/dL

## 2017-10-29 LAB — PROTEIN, TOTAL: Total Protein: 5.9 g/dL — ABNORMAL LOW (ref 6.5–8.1)

## 2017-10-29 LAB — LACTATE DEHYDROGENASE: LDH: 213 U/L — ABNORMAL HIGH (ref 98–192)

## 2017-10-29 MED ORDER — SODIUM CHLORIDE 0.9 % IV BOLUS
500.0000 mL | Freq: Once | INTRAVENOUS | Status: AC
  Administered 2017-10-29: 500 mL via INTRAVENOUS

## 2017-10-29 MED ORDER — MAGNESIUM SULFATE 2 GM/50ML IV SOLN
2.0000 g | Freq: Once | INTRAVENOUS | Status: AC
  Administered 2017-10-29: 2 g via INTRAVENOUS
  Filled 2017-10-29: qty 50

## 2017-10-29 NOTE — Procedures (Signed)
Thoracentesis Procedure Note  Pre-operative Diagnosis: Right Pleural Effusion   Post-operative Diagnosis: same  Indications: Diagnostic evaluation of pleural fluid and therapeutic relief of dyspnea.   Procedure Details  Consent: Informed consent was obtained. Risks of the procedure were discussed including: infection, bleeding, pain, pneumothorax.  Under sterile conditions the patient was positioned. Betadine solution and sterile drapes were utilized.  1% plain lidocaine was used to anesthetize the 8th rib space. Fluid was obtained without any difficulties and minimal blood loss.  A dressing was applied to the wound and wound care instructions were provided.   Findings 1425 ml of clear yellow pleural fluid was obtained. Sample was sent to pathology for culture, glucose, LDH, protein and cytology.    Complications:  None; patient tolerated the procedure well.          Condition: stable  Plan A follow up chest x-ray was ordered. Await fluid analysis.   Pending FOB next week if no diagnosis from pleural fluid.   Procedure performed under direct supervision of Dr. Lynetta Mare and with ultrasound guidance.  Noe Gens, NP-C [facility] Pgr: [phone] or if no answer [phone] 10/29/2017, 10:04 AM

## 2017-10-29 NOTE — Progress Notes (Signed)
Name: Wyatt Salas MRN: [id number] DOB: [birth year]    ADMISSION DATE:  11/13/2017 CONSULTATION DATE:  10/28/17  REFERRING MD :  Dr. Sloan Leiter / [facility]   CHIEF COMPLAINT:  Lung Mass    HISTORY OF PRESENT ILLNESS:  67 y/o M who presented to [facility] ER on 4/9 with reports of left forehead & left shoulder pain after falling out of his wheelchair.  The patient was in a LUE cast on arrival from prior fall.  In addition, he reported cough with productive sputum.    The patient carries a history of ETOH abuse, DM, seizures, chronic diastolic CHF, TBI and known lung mass worrisome for cancer.  He was previously arranged for outpatient bronchoscopy with Dr. Lamonte Sakai in November 2018 but the patient canceled the appointment asking for a second opinion at the [place].  He reported on admit that he went to the [place] and was told he didn't have lung cancer.  At baseline, he lives alone and uses a four prong walker to transfer from bed to chair.    In the ER, he was initially evaluated wihout acute findings / negative LUE XRAY, CT head/neck and was set for discharge.  At time of discharge, he was noted to have AFwRVR (150's).  The patient was admitted for further work up.  CBC showed a normocytic anemia with hgb of 12.  UDS negative, troponin negative.  CXR showed an interval increase in the size of the RUL posterior masslike abnormality (4 x 3.5 x 4.8 cm, previously 3.6 cm).  He was treated for AF with diltiazem and lopressor.    PCCM called for evaluation of lung mass.   SUBJECTIVE:  No acute complaints this am.  Pt making jokes.  Talking about his family and need for a possible rehab facility.   VITAL SIGNS: Temp:  [98 F (36.7 C)-99.4 F (37.4 C)] 98.9 F (37.2 C) (04/12 0453) Pulse Rate:  [55-85] 85 (04/12 0823) Resp:  [17-27] 18 (04/12 0453) BP: (90-119)/(51-76) 103/69 (04/12 0700) SpO2:  [94 %-99 %] 95 % (04/12 0453)  PHYSICAL EXAMINATION: General: cachectic male in NAD  HEENT: MM pink/moist, poor dentition   Neuro: AAOx4, speech clear, MAE  CV: s1s2 rrr, no m/r/g PULM: even/non-labored, lungs bilaterally with posterior bronchial breath sounds, diminished RLL OF:BPZW, non-tender, bsx4 active  Extremities: warm/dry, no edema  Skin: no rashes or lesions  Recent Labs  Lab 10/28/17 0001 10/28/17 0319 10/29/17 0426  NA 138 133* 135  K 3.5 2.9* 3.8  CL 101 98* 101  CO2 23 22 25   BUN 11 10 8   CREATININE 0.72 0.62 0.54*  GLUCOSE 175* 258* 138*    Recent Labs  Lab 11/04/2017 2052 10/28/17 0319 10/29/17 0426  HGB 12.0* 11.1* 10.8*  HCT 38.0* 36.2* 34.8*  WBC 11.1* 9.5 9.1  PLT 391 370 346    No results found.    SIGNIFICANT EVENTS  4/09  Admit after falling out of wheelchair, [facility] 4/11 PCCM consulted regarding lung mass  STUDIES CT Head / Neck 4/9 >> negative for acute intracranial abnormality or cervical spine fracture, right cerebral hemisphere encephalomalacia, severe cerebellar atrophy CT Chest 4/9 >> interval increase in spiculated RUL mass now measuring 5.8 x5.3 x 5cm with progression of metastatic mediastinal and hilar adenopathy as well as development of satellite pulmonary nodules in the right lung.  Slight luminal narrowing of the distal right mainstem bronchus due to adenopathy without occlusion.  Ovoid density in the left major fissure may represent a  pseudo lesion with fluid in the fissure vs an intra fissural mass measuring 2.2x1.2x1.7 cm.  Loculated moderate R pleural effusion.  CT Abd/Pelvis 4/9 >> marked urinary distention (approx 2L), no apparent evidence of metastatic disease within the abd/pelvis   CULTURES UC 4/9 >> less than 10K colonies of insignificant growth Sputum 4/10 >>  ANTIBIOTICS     ASSESSMENT / PLAN:  Discussion:  67 y/o M admitted after falling out his wheelchair.  Found to have AF with RVR. Known hx of lung mass and did not follow up despite being set up for outpatient bronchoscopy.  Current imaging notable for increase in size of RUL  spiculated mass,  metastatic mediastinal disease, hilar adenopathy and satellite pulmonary nodules.  He has unfortunately been lost to follow up.  The patient reports he would want his pastor (Rev. Floyde Parkins [phone]) to make decisions for him in the event he could not.  He has poor insight into his current situation and what seems to be limited health care literacy.  In addition, he reports a limited support system.  Despite this, he states he would want treatment for malignancy if it is indeed cancer.     1. RUL Spiculated Mass - with disease progression, highly worrisome for advanced stage cancer.  I am very concerned that if he leaves, he will not follow up for biopsy or treatment.    2. Right Pleural Effusion   3. Weight Loss / Moderate to Severe Malnutrition   4. ETOH Abuse / Poor Dentition   Plan: Proceed with thoracentesis now PCXR post thora  Follow fluid analysis. Send full volume of bag for spin down for cell block.  Hopeful to avoid FOB. If fluid negative, proceed with FOB as planned for 4/16 0700.   Will need to be NPO after MN on 4/16  Follow intermittent CXR  Monitor fever curve / WBC trend  Send full volume  Follow intermittent CXR  Recommend palliative care input to assist patient with goals of care, decisions regarding therapy.   Recommend PT/SW consult for likely SNF need   Noe Gens, NP-C [facility] Pgr: [phone] or if no answer [phone] 10/29/2017, 10:00 AM

## 2017-10-29 NOTE — Progress Notes (Signed)
PROGRESS NOTE        PATIENT DETAILS Name: Wyatt Salas Age: 67 y.o. Sex: male Date of Birth: 10-20-1950 Admit Date: 11/15/2017 Admitting Physician Vianne Bulls, MD TDV:VOHYW, [name], MD  Brief Narrative: Patient is a 67 y.o. male with history of alcohol use, seizure disorder, DM-2, hypertension, known lung mass (requested second opinion in May 2018) presented to the ED following a mechanical fall, found to be in A. fib with RVR and admitted to the hospitalist service.  Subjective: Lying comfortably in bed-appears to be in rate controlled atrial fibrillation.  He appears to have a left wrist drop after removal of his splint on 4/11.  Assessment/Plan: Atrial fibrillation with RVR: Rate controlled-continue metoprolol.  No longer on Cardizem infusion.  Felt to be a poor long-term candidate for anticoagulation given prior history of subdural hematoma, frequent falls, and noncompliance/lack of follow-up with his outpatient MDs.  Echo in March 2018 showed preserved EF   Acute urinary retention: Foley catheter placed on admission-continue Flomax-will attempt a voiding trial today-DC Foley catheter.   Lung mass: Apparently was scheduled for a bronchoscopy late last year-but [facility]-he now reports acceptance and willingness to proceed with workup if offered at this time.  Case was discussed with pulmonology-recommendations are to proceed with a thoracocentesis first before proceeding with a flexible bronchoscopy.  Thoracocentesis was performed on 4/12-await cytology.  Difficult situation-patient has very poor insight into his medical issues-he probably has ongoing alcohol use-and is notoriously noncompliant with follow-up-we will continue with attempts to get tissue diagnoses-I think it is appropriate to get palliative care to engage with this patient and family.  Hypokalemia: Repleted, continue to recheck periodically.  Likely secondary to  alcohol use.    Hypomagnesemia: Continue to replete, recheck in a.m.  Likely secondary to alcohol use.    Hypertension: Blood pressure is soft-stop lisinopril.  Patient already on metoprolol for rate control for atrial fibrillation.  Seizure disorder: Continue Keppra/Dilantin and Vimpat  Left humerus fracture: Due to mechanical fall that the patient had on early March, he was seen in the emergency room on 3/5 and had a splint placed-and he was supposed to follow-up with outpatient orthopedics.He unfortunately never followed up-Ortho was consulted on 4/11- splint was removed on 4/11-it appears patient may have developed a mild left wrist drop -await OT eval.   History of alcohol use: Claims he does not use alcohol any longer-claims that his last use was 1 year back-but he had an admission in this facility and late last year where he had alcohol withdrawal symptoms.  No withdrawal symptoms at this time-continue Ativan per protocol.  Deconditioning/debility/frequent falls-await PT evaluation  Telemetry (independently reviewed): Afib  DVT Prophylaxis: Prophylactic Lovenox  Code Status: Full code   Family Communication: None at bedside  Disposition Plan: Remain inpatient-likely transfer to SNF next week  Antimicrobial agents: Anti-infectives (From admission, onward)   None      Procedures: None  CONSULTS:  cardiology  Time spent: 25- minutes-Greater than 50% of this time was spent in counseling, explanation of diagnosis, planning of further management, and coordination of care.  MEDICATIONS: Scheduled Meds: . atorvastatin  40 mg Oral q1800  . docusate sodium  200 mg Oral BID  . enoxaparin (LOVENOX) injection  40 mg Subcutaneous Q24H  . feeding supplement (ENSURE ENLIVE)  237 mL  Oral BID BM  . folic acid  1 mg Oral Daily  . insulin aspart  0-5 Units Subcutaneous QHS  . insulin aspart  0-9 Units Subcutaneous TID WC  . lacosamide  100 mg Oral BID  . levETIRAcetam  1,500  mg Oral BID  . lisinopril  10 mg Oral BID  . metoprolol tartrate  25 mg Oral BID  . multivitamin with minerals  1 tablet Oral Daily  . phenytoin  30 mg Oral BID  . sodium chloride flush  3 mL Intravenous Q12H  . tamsulosin  0.4 mg Oral Daily  . thiamine  100 mg Oral Daily   Continuous Infusions:  PRN Meds:.acetaminophen **OR** acetaminophen, HYDROcodone-acetaminophen, LORazepam **OR** LORazepam, ondansetron **OR** ondansetron (ZOFRAN) IV, polyethylene glycol, polyvinyl alcohol   PHYSICAL EXAM: Vital signs: Vitals:   10/29/17 1041 10/29/17 1050 10/29/17 1054 10/29/17 1133  BP: (!) 83/62 (!) 79/56 (!) 81/55 97/67  Pulse:   64   Resp:      Temp:      TempSrc:      SpO2:      Weight:      Height:       Filed Weights   10/27/17 0600 10/27/17 0915 10/28/17 0500  Weight: 68 kg (150 lb) 54.9 kg (121 lb 0.5 oz) 57.4 kg (126 lb 8.7 oz)   Body mass index is 16.7 kg/m.   General appearance :Awake, alert, not in any distress.  Eyes:, pupils equally reactive to light and accomodation,no scleral icterus. HEENT: Atraumatic and Normocephalic Neck: supple, no JVD. Resp:Good air entry bilaterally,no rales or rhonchi CVS: S1 S2 regular, no murmurs.  GI: Bowel sounds present, Non tender and not distended with no gaurding, rigidity or rebound. Extremities: B/L Lower Ext shows no edema, both legs are warm to touch Neurology:  speech clear,Non focal, sensation is grossly intact. Psychiatric: Normal judgment and insight. Normal mood. Musculoskeletal:No digital cyanosis Skin:No Rash, warm and dry Wounds:N/A  I have personally reviewed following labs and imaging studies  LABORATORY DATA: CBC: Recent Labs  Lab 11/07/2017 2052 10/28/17 0319 10/29/17 0426  WBC 11.1* 9.5 9.1  NEUTROABS 8.4*  --   --   HGB 12.0* 11.1* 10.8*  HCT 38.0* 36.2* 34.8*  MCV 83.9 83.4 82.3  PLT 391 370 381    Basic Metabolic Panel: Recent Labs  Lab 10/21/2017 2052 10/27/17 1043 10/28/17 0001 10/28/17 0319  10/29/17 0426  NA 143  --  138 133* 135  K 4.1  --  3.5 2.9* 3.8  CL 104  --  101 98* 101  CO2 26  --  _0 GLUCOSE 132*  --  175* 258* 138*  BUN 9  --  _1 CREATININE 0.62  --  0.72 0.62 0.54*  CALCIUM 10.0  --  9.5 8.8* 9.0  MG  --  1.5* 1.9 1.7 1.9    GFR: Estimated Creatinine Clearance: 71.8 mL/min (A) (by C-G formula based on SCr of 0.54 mg/dL (L)).  Liver Function Tests: Recent Labs  Lab 10/23/2017 2052 10/28/17 0319 10/29/17 1046  AST 24 40  --   ALT 18 28  --   ALKPHOS 81 69  --   BILITOT 0.6 0.5  --   PROT 7.8 6.1* 5.9*  ALBUMIN 3.3* 2.5*  --    Recent Labs  Lab 10/20/2017 2052  LIPASE 26   No results for input(s): AMMONIA in the last 168 hours.  Coagulation Profile: Recent Labs  Lab 10/27/17 1043  INR 1.11    Cardiac Enzymes: No results for input(s): CKTOTAL, CKMB, CKMBINDEX, TROPONINI in the last 168 hours.  BNP (last 3 results) No results for input(s): PROBNP in the last 8760 hours.  HbA1C: No results for input(s): HGBA1C in the last 72 hours.  CBG: Recent Labs  Lab 10/28/17 1115 10/28/17 1526 10/28/17 2257 10/29/17 0738 10/29/17 1131  GLUCAP 183* 132* 139* 152* 125*    Lipid Profile: No results for input(s): CHOL, HDL, LDLCALC, TRIG, CHOLHDL, LDLDIRECT in the last 72 hours.  Thyroid Function Tests: Recent Labs    10/27/17 1043  TSH 0.962    Anemia Panel: No results for input(s): VITAMINB12, FOLATE, FERRITIN, TIBC, IRON, RETICCTPCT in the last 72 hours.  Urine analysis:    Component Value Date/Time   COLORURINE AMBER (A) 11/12/2017 2311   APPEARANCEUR CLEAR 11/13/2017 2311   LABSPEC 1.026 11/13/2017 2311   PHURINE 5.0 11/04/2017 2311   GLUCOSEU NEGATIVE 11/12/2017 2311   HGBUR NEGATIVE 10/18/2017 2311   BILIRUBINUR NEGATIVE 11/14/2017 2311   KETONESUR NEGATIVE 10/28/2017 2311   PROTEINUR 30 (A) 11/06/2017 2311   UROBILINOGEN 1.0 01/25/2014 1700   NITRITE NEGATIVE 11/15/2017 2311   LEUKOCYTESUR NEGATIVE  10/23/2017 2311    Sepsis Labs: Lactic Acid, Venous    Component Value Date/Time   LATICACIDVEN 2.0 04/20/2010 1910    MICROBIOLOGY: Recent Results (from the past 240 hour(s))  Urine culture     Status: Abnormal   Collection Time: 10/25/2017 11:19 PM  Result Value Ref Range Status   Specimen Description   Final    URINE, CATHETERIZED Performed at [facility], [place] [address], [place], [place] [address]    Special Requests   Final    NONE Performed at [facility], [place] [address] [place], [place] [address]    Culture (A)  Final    <10,000 COLONIES/mL INSIGNIFICANT GROWTH Performed at [place] [address] [place], [place] [address]    Report Status 10/28/2017 FINAL  Final  MRSA PCR Screening     Status: None   Collection Time: 10/27/17  9:21 AM  Result Value Ref Range Status   MRSA by PCR NEGATIVE NEGATIVE Final    Comment:        The GeneXpert MRSA Assay (FDA approved for NASAL specimens only), is one component of a comprehensive MRSA colonization surveillance program. It is not intended to diagnose MRSA infection nor to guide or monitor treatment for MRSA infections. Performed at [facility], [place] [address] [place], [place] [address]   Culture, sputum-assessment     Status: None   Collection Time: 10/27/17  3:30 PM  Result Value Ref Range Status   Specimen Description EXPECTORATED SPUTUM  Final   Special Requests NONE  Final   Sputum evaluation   Final    THIS SPECIMEN IS ACCEPTABLE FOR SPUTUM CULTURE Performed at [facility], [place] [address] [place], [place] [address]    Report Status 10/27/2017 FINAL  Final  Culture, respiratory (NON-Expectorated)     Status: None (Preliminary result)   Collection Time: 10/27/17  3:30 PM  Result Value Ref Range Status   Specimen Description   Final    EXPECTORATED SPUTUM Performed at [place] [address], [place], [place] [address]    Special Requests   Final    NONE Reflexed from [id number] Performed at [facility], [place] [address] [place], [place] [address]  Gram Stain   Final    FEW WBC PRESENT,BOTH PMN AND MONONUCLEAR MODERATE GRAM POSITIVE COCCI    Culture   Final    CULTURE REINCUBATED FOR BETTER GROWTH Performed at [facility], [place] [address] [place], [place] [address]    Report Status PENDING  Incomplete    RADIOLOGY STUDIES/RESULTS: Ct Abdomen Pelvis Wo Contrast  Result Date: 10/21/2017 CLINICAL DATA:  Left shoulder and forehead pain after fall this morning. Lung mass. EXAM: CT CHEST, ABDOMEN, AND PELVIS WITHOUT CONTRAST TECHNIQUE: Multidetector CT imaging of the chest, abdomen and pelvis was performed without IV contrast. CONTRAST:  The patient's IV infiltrated during imaging and the order was changed to CT of the chest, abdomen and pelvis without per ordering clinician. COMPARISON:  PET-CT 05/31/2017 FINDINGS: CT CHEST FINDINGS Cardiovascular: Top-normal size heart with left main and three-vessel coronary arteriosclerosis. Aortic atherosclerosis without aneurysm. The unenhanced pulmonary vasculature is difficult to assess beyond the main pulmonary artery due to soft tissue masses and adenopathy about the right hilum and mediastinum. Mediastinum/Nodes: Interval increase in masslike abnormalities in the mediastinum and right hilum compatible with progression of metastatic adenopathy and/or increase and extension of known right upper lobe mass. Largest lymph node identified is subcarinal at 2.8 cm. Interval increase in right upper paratracheal lymph node measuring 1.1 cm. Exact margins of additional lymph node masses are difficult to delineate on this unenhanced study. There is luminal narrowing of the distal right mainstem bronchus without occlusion secondary to the right hilar, mediastinal and subcarinal masslike abnormalities. The thyroid gland is unremarkable.  The esophagus is not well visualized. Lungs/Pleura: Partially loculated small to moderate right effusion. New pulmonary nodular masslike opacities are noted within the right upper, middle and lower lobes with interval progression in size of spiculated right upper lobe mass now measuring approximately 5.8 x 5.3 x 5 cm. Within the superior segment of the aerated right lower lobe are metastatic nodules measuring up to 2.2 cm. Additional nodules are seen in the right middle and anterior right lower lobe. Left lung remains clear. Nodular thickening along the left major fissure is seen which may represent fluid in the fissure versus an intra fissural mass. This measures 2.2 x 1.2 x 1.7 cm. Peribronchial thickening is noted within both lower lobes. Musculoskeletal: Remote right posterior seventh through ninth rib fractures. No suspicious osseous lesions. Extravasated contrast noted in the soft tissues of the right arm. CT ABDOMEN PELVIS FINDINGS Hepatobiliary: No biliary dilatation. The unenhanced liver is unremarkable. The gallbladder is physiologically distended. Pancreas: No ductal dilatation or inflammation. Spleen: No splenomegaly or mass. Adrenals/Urinary Tract: No adrenal mass. Interpolar 2.5 cm simple right renal cyst. Unremarkable noncontrast appearance of the left kidney. No obstructive uropathy. Marked bladder distention to the umbilicus with the bladder measuring 14.7 x 14.1 x 18.8 cm (volume = 2040 cm^3). Stomach/Bowel: Stomach is within normal limits. Appendix appears normal. No evidence of bowel wall thickening, distention, or inflammatory changes. Vascular/Lymphatic: Moderate aortoiliac and branch vessel atherosclerosis. No aneurysm. No adenopathy. Reproductive: Normal size prostate. Other: No free air nor free fluid. Musculoskeletal: Degenerative disc disease L5-S1. No aggressive osseous lesions. IMPRESSION: Extravasation of IV contrast into the patient's right arm. Study was flagged for contrast  extravasation radiology PA follow-up should the patient be discharged. Chest CT: 1. Interval increase in spiculated right upper lobe mass now measuring approximately 5.8 x 5.3 x 5 cm with progression of metastatic mediastinal and hilar adenopathy as well as development of satellite pulmonary nodules in the right  lung. Slight luminal narrowing of the distal right mainstem bronchus due to adenopathy. No occlusion noted however. 2. Ovoid density in the left major fissure may represent a pseudo lesion with fluid in the fissure versus an intra fissural mass measuring 2.2 x 1.2 x 1.7 cm. 3. Loculated moderate right pleural effusion. Abdomen and pelvic CT: 1. Marked urinary distention with volume of approximately 2 L. 2. Simple right interpolar renal cyst measuring 2.5 cm. 3. No apparent evidence of metastatic disease within the abdomen and pelvis given limitations of a noncontrast exam. Electronically Signed   By: Ashley Royalty M.D.   On: 10/27/2017 22:55   Dg Chest 2 View  Result Date: 11/11/2017 CLINICAL DATA:  Cough, history of lung cancer. EXAM: CHEST - 2 VIEW COMPARISON:  05/18/2017 chest CT, CXR 05/18/2017 FINDINGS: Interval increase in size of masslike opacity in the right upper lobe, best appreciated on the sagittal view measuring approximately 4 x 3.5 x 4.8 cm with right paratracheal and perihilar soft tissue prominence concerning for progression of lymphadenopathy. Superimposed adjacent pneumonia is difficult to entirely exclude. Hyperinflated left lung without pulmonary consolidation or dominant mass. Heart size is top-normal. No aortic aneurysm. No suspicious osseous lesions. IMPRESSION: 1. Interval increase in size of right upper lobe posterior masslike abnormality now estimated at 4 x 3.5 x 4.8 cm, previously estimated at approximately 3.6 cm. 2. Interval increase in soft tissue prominence about the mediastinum and right hilum compatible with metastatic lymphadenopathy. Superimposed adjacent airspace  opacities cannot exclude pneumonia and/or areas of atelectasis. Electronically Signed   By: Ashley Royalty M.D.   On: 10/18/2017 19:37   Ct Head Wo Contrast  Result Date: 11/06/2017 CLINICAL DATA:  [address] out of wheelchair today. Left forehead and left shoulder pain. Initial encounter. EXAM: CT HEAD WITHOUT CONTRAST CT CERVICAL SPINE WITHOUT CONTRAST TECHNIQUE: Multidetector CT imaging of the head and cervical spine was performed following the standard protocol without intravenous contrast. Multiplanar CT image reconstructions of the cervical spine were also generated. COMPARISON:  09/21/2017 FINDINGS: CT HEAD FINDINGS Brain: Right MCA territory encephalomalacia is unchanged as is more extensive right temporal lobe encephalomalacia which may be in part postsurgical. There is marked cerebral scratched of there is marked cerebellar atrophy. Periventricular white matter hypodensities are unchanged and nonspecific but compatible with mild chronic small vessel ischemic disease. No acute large territory infarct, intracranial hemorrhage, mass, midline shift, or extra-axial fluid collection is identified. Vascular: Calcified atherosclerosis at the skull base. No hyperdense vessel. Skull: Prior right pterional craniotomy.  No acute fracture. Sinuses/Orbits: The visualized paranasal sinuses and mastoid air cells are clear. The orbits are unremarkable. Other: None. CT CERVICAL SPINE FINDINGS Alignment: Chronic reversal of the normal cervical lordosis. Unchanged grade 1 anterolisthesis of C2 on C3 and C3 on C4. Skull base and vertebrae: No acute fracture or suspicious osseous lesion. Soft tissues and spinal canal: No prevertebral fluid or swelling. No visible canal hematoma. Disc levels: Moderate diffuse cervical disc degeneration and moderate to severe left greater than right mid upper cervical facet arthrosis resulting in advanced multilevel neural foraminal stenosis. Upper chest: Right pleural effusion. Unchanged 3 mm left  apical lung nodule. Other: Moderate calcified atherosclerosis at the carotid bifurcations. IMPRESSION: 1. No evidence of acute intracranial abnormality. 2. Right cerebral hemisphere encephalomalacia. Severe cerebellar atrophy. 3. No acute cervical spine fracture. Electronically Signed   By: Logan Bores M.D.   On: 10/28/2017 19:51   Ct Chest Wo Contrast  Result Date: 11/07/2017 CLINICAL DATA:  Left shoulder and  forehead pain after fall this morning. Lung mass. EXAM: CT CHEST, ABDOMEN, AND PELVIS WITHOUT CONTRAST TECHNIQUE: Multidetector CT imaging of the chest, abdomen and pelvis was performed without IV contrast. CONTRAST:  The patient's IV infiltrated during imaging and the order was changed to CT of the chest, abdomen and pelvis without per ordering clinician. COMPARISON:  PET-CT 05/31/2017 FINDINGS: CT CHEST FINDINGS Cardiovascular: Top-normal size heart with left main and three-vessel coronary arteriosclerosis. Aortic atherosclerosis without aneurysm. The unenhanced pulmonary vasculature is difficult to assess beyond the main pulmonary artery due to soft tissue masses and adenopathy about the right hilum and mediastinum. Mediastinum/Nodes: Interval increase in masslike abnormalities in the mediastinum and right hilum compatible with progression of metastatic adenopathy and/or increase and extension of known right upper lobe mass. Largest lymph node identified is subcarinal at 2.8 cm. Interval increase in right upper paratracheal lymph node measuring 1.1 cm. Exact margins of additional lymph node masses are difficult to delineate on this unenhanced study. There is luminal narrowing of the distal right mainstem bronchus without occlusion secondary to the right hilar, mediastinal and subcarinal masslike abnormalities. The thyroid gland is unremarkable. The esophagus is not well visualized. Lungs/Pleura: Partially loculated small to moderate right effusion. New pulmonary nodular masslike opacities are noted  within the right upper, middle and lower lobes with interval progression in size of spiculated right upper lobe mass now measuring approximately 5.8 x 5.3 x 5 cm. Within the superior segment of the aerated right lower lobe are metastatic nodules measuring up to 2.2 cm. Additional nodules are seen in the right middle and anterior right lower lobe. Left lung remains clear. Nodular thickening along the left major fissure is seen which may represent fluid in the fissure versus an intra fissural mass. This measures 2.2 x 1.2 x 1.7 cm. Peribronchial thickening is noted within both lower lobes. Musculoskeletal: Remote right posterior seventh through ninth rib fractures. No suspicious osseous lesions. Extravasated contrast noted in the soft tissues of the right arm. CT ABDOMEN PELVIS FINDINGS Hepatobiliary: No biliary dilatation. The unenhanced liver is unremarkable. The gallbladder is physiologically distended. Pancreas: No ductal dilatation or inflammation. Spleen: No splenomegaly or mass. Adrenals/Urinary Tract: No adrenal mass. Interpolar 2.5 cm simple right renal cyst. Unremarkable noncontrast appearance of the left kidney. No obstructive uropathy. Marked bladder distention to the umbilicus with the bladder measuring 14.7 x 14.1 x 18.8 cm (volume = 2040 cm^3). Stomach/Bowel: Stomach is within normal limits. Appendix appears normal. No evidence of bowel wall thickening, distention, or inflammatory changes. Vascular/Lymphatic: Moderate aortoiliac and branch vessel atherosclerosis. No aneurysm. No adenopathy. Reproductive: Normal size prostate. Other: No free air nor free fluid. Musculoskeletal: Degenerative disc disease L5-S1. No aggressive osseous lesions. IMPRESSION: Extravasation of IV contrast into the patient's right arm. Study was flagged for contrast extravasation radiology PA follow-up should the patient be discharged. Chest CT: 1. Interval increase in spiculated right upper lobe mass now measuring approximately  5.8 x 5.3 x 5 cm with progression of metastatic mediastinal and hilar adenopathy as well as development of satellite pulmonary nodules in the right lung. Slight luminal narrowing of the distal right mainstem bronchus due to adenopathy. No occlusion noted however. 2. Ovoid density in the left major fissure may represent a pseudo lesion with fluid in the fissure versus an intra fissural mass measuring 2.2 x 1.2 x 1.7 cm. 3. Loculated moderate right pleural effusion. Abdomen and pelvic CT: 1. Marked urinary distention with volume of approximately 2 L. 2. Simple right interpolar renal cyst measuring  2.5 cm. 3. No apparent evidence of metastatic disease within the abdomen and pelvis given limitations of a noncontrast exam. Electronically Signed   By: Ashley Royalty M.D.   On: 11/08/2017 22:55   Ct Cervical Spine Wo Contrast  Result Date: 11/15/2017 CLINICAL DATA:  [address] out of wheelchair today. Left forehead and left shoulder pain. Initial encounter. EXAM: CT HEAD WITHOUT CONTRAST CT CERVICAL SPINE WITHOUT CONTRAST TECHNIQUE: Multidetector CT imaging of the head and cervical spine was performed following the standard protocol without intravenous contrast. Multiplanar CT image reconstructions of the cervical spine were also generated. COMPARISON:  09/21/2017 FINDINGS: CT HEAD FINDINGS Brain: Right MCA territory encephalomalacia is unchanged as is more extensive right temporal lobe encephalomalacia which may be in part postsurgical. There is marked cerebral scratched of there is marked cerebellar atrophy. Periventricular white matter hypodensities are unchanged and nonspecific but compatible with mild chronic small vessel ischemic disease. No acute large territory infarct, intracranial hemorrhage, mass, midline shift, or extra-axial fluid collection is identified. Vascular: Calcified atherosclerosis at the skull base. No hyperdense vessel. Skull: Prior right pterional craniotomy.  No acute fracture. Sinuses/Orbits: The  visualized paranasal sinuses and mastoid air cells are clear. The orbits are unremarkable. Other: None. CT CERVICAL SPINE FINDINGS Alignment: Chronic reversal of the normal cervical lordosis. Unchanged grade 1 anterolisthesis of C2 on C3 and C3 on C4. Skull base and vertebrae: No acute fracture or suspicious osseous lesion. Soft tissues and spinal canal: No prevertebral fluid or swelling. No visible canal hematoma. Disc levels: Moderate diffuse cervical disc degeneration and moderate to severe left greater than right mid upper cervical facet arthrosis resulting in advanced multilevel neural foraminal stenosis. Upper chest: Right pleural effusion. Unchanged 3 mm left apical lung nodule. Other: Moderate calcified atherosclerosis at the carotid bifurcations. IMPRESSION: 1. No evidence of acute intracranial abnormality. 2. Right cerebral hemisphere encephalomalacia. Severe cerebellar atrophy. 3. No acute cervical spine fracture. Electronically Signed   By: Logan Bores M.D.   On: 10/30/2017 19:51   Dg Chest Port 1 View  Result Date: 10/29/2017 CLINICAL DATA:  Status post thoracentesis. EXAM: PORTABLE CHEST 1 VIEW COMPARISON:  Chest radiograph 10/23/2017 FINDINGS: Negative for pneumothorax following thoracentesis. There may be a small amount of right pleural fluid present. Extensive airspace densities throughout the right lung particularly in the perihilar regions. The degree of right lung consolidation may have slightly increased. Left lung remains clear. Heart size is grossly stable. Old right rib fractures. IMPRESSION: Negative for pneumothorax following thoracentesis. Extensive airspace disease and consolidation in the right lung and cannot exclude progression since prior examination. Electronically Signed   By: Markus Daft M.D.   On: 10/29/2017 10:23   Dg Shoulder Left  Result Date: 10/30/2017 CLINICAL DATA:  [place] striking the left side today. Recent distal humeral fracture. EXAM: LEFT SHOULDER - 2+ VIEW  COMPARISON:  Left humerus series of Nov 17, 2009 FINDINGS: There is chronic deformity of the distal clavicle. No acute clavicular fracture is observed. The scapula and proximal humerus appear normal. There is narrowing of the glenohumeral joint space. IMPRESSION: There is no acute fracture or dislocation of the left shoulder. 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 There is chronic deformity of the distal clavicle. Electronically Signed   By: David  Martinique M.D.   On: 10/25/2017 11:20     LOS: 2 days   Oren Binet, MD  [facility] Pager:[phone]  If 7PM-7AM, please contact night-coverage [email or web address] Password [email or web address] 10/29/2017, 1:18 PM

## 2017-10-29 NOTE — Evaluation (Signed)
Occupational Therapy Evaluation Patient Details Name: Wyatt Salas MRN: [id number] DOB: [birth year] Today's Date: 10/29/2017    History of Present Illness pt was admitted for A Fib with RVR.  PMH:  TIB, ETOH, DM, crani.  Had L distal elbow fx last month   Clinical Impression   This 67 year old man was admitted for the above.  Eval was limited by BP. Pt was functioning at mod I level at home and someone took him grocery shopping. Pt just got splint off LUE today.  He has wrist drop and limited ROM/strength throughout. Will follow in acute setting with mod I goals.  Overall min A at this time. Unable to stand this session. Will update follow up on next visit.  Pt may benefit from wrist cock up splint.  He may need to follow up with OP OT for an outrigger, if appropriate.      Follow Up Recommendations  CIR;SNF;Home health OT(depending upon progress)    Equipment Recommendations  (tba ? 3;1 commode)    Recommendations for Other Services       Precautions / Restrictions Precautions Precautions: Fall Precaution Comments: Per Dr [name], ROM elbow OK, splint just removed; follow up xray pending, WBAT Restrictions Weight Bearing Restrictions: No      Mobility Bed Mobility Overal bed mobility: Modified Independent             General bed mobility comments: from flat bed with extra time and effort  Transfers                 General transfer comment: NT due to low BP    Balance Overall balance assessment: (sitting balance good)                                         ADL either performed or assessed with clinical judgement   ADL Overall ADL's : Needs assistance/impaired Eating/Feeding: Set up;Sitting   Grooming: Set up;Sitting   Upper Body Bathing: Minimal assistance;Sitting   Lower Body Bathing: Minimal assistance;Sitting/lateral leans   Upper Body Dressing : Set up;Sitting   Lower Body Dressing: Minimal assistance;Sitting/lateral leans                  General ADL Comments: pt had low BP; BP dropped when sitting up. See vitals section of chart. He was functioning at mod I level at home.  Pt uses R hand only to don sock.  Min A today.  unable to stand due to BP. Pt uses manual w/c and quad cane at home     Vision         Perception     Praxis      Pertinent Vitals/Pain Pain Assessment: Faces Faces Pain Scale: Hurts even more Pain Location: LUE with movement Pain Intervention(s): Limited activity within patient's tolerance;Monitored during session;Repositioned     Hand Dominance     Extremity/Trunk Assessment Upper Extremity Assessment Upper Extremity Assessment: LUE deficits/detail LUE Deficits / Details: pt has been immobilized in splint until yesterday.  He is unable to lift arm without assistance. Tolerates about a 20 degree range of elbow flexion/extension.  Has wrist drop, and limited ROM of fingers. Unable to grasp or oppose thumb to any digits           Communication Communication Communication: No difficulties   Cognition Arousal/Alertness: Awake/alert Behavior During Therapy: WFL for tasks assessed/performed Overall  Cognitive Status: Within Functional Limits for tasks assessed(during eval)                                     General Comments       Exercises Exercises: Other exercises   Shoulder Instructions      Home Living Family/patient expects to be discharged to:: Private residence Living Arrangements: Alone Available Help at Discharge: Friend(s) Type of Home: Apartment             Bathroom Shower/Tub: Teacher, early years/pre: Standard     Home Equipment: Hand held shower head;Tub bench;Walker - 2 wheels;Cane - quad;Wheelchair - power;Wheelchair - manual          Prior Functioning/Environment Level of Independence: Independent with assistive device(s)        Comments: uses manual w/c mostly. Walks short distances with quad cane.  Sister  gets groceries         OT Problem List: Decreased strength;Decreased range of motion;Decreased activity tolerance;Impaired balance (sitting and/or standing);Cardiopulmonary status limiting activity;Pain;Decreased knowledge of use of DME or AE      OT Treatment/Interventions: Self-care/ADL training;DME and/or AE instruction;Patient/family education;Therapeutic activities;Therapeutic exercise(standing balance NT)    OT Goals(Current goals can be found in the care plan section) Acute Rehab OT Goals Patient Stated Goal: none stated OT Goal Formulation: With patient Time For Goal Achievement: 11/12/17 Potential to Achieve Goals: Good ADL Goals Pt Will Transfer to Toilet: with modified independence(using w/c to regular commode vs 3:1) Additional ADL Goal #1: pt will complete adl at mod I level from wc level  OT Frequency: Min 2X/week   Barriers to D/C:            Co-evaluation PT/OT/SLP Co-Evaluation/Treatment: Yes Reason for Co-Treatment: For patient/therapist safety PT goals addressed during session: Mobility/safety with mobility OT goals addressed during session: ADL's and self-care      AM-PAC PT "6 Clicks" Daily Activity     Outcome Measure Help from another person eating meals?: A Little Help from another person taking care of personal grooming?: A Little Help from another person toileting, which includes using toliet, bedpan, or urinal?: A Little Help from another person bathing (including washing, rinsing, drying)?: A Little Help from another person to put on and taking off regular upper body clothing?: A Little Help from another person to put on and taking off regular lower body clothing?: A Little 6 Click Score: 18   End of Session Nurse Communication: (BP)  Activity Tolerance: Treatment limited secondary to medical complications (Comment)(BP) Patient left: in bed;with nursing/sitter in room(in trendelenberg)  OT Visit Diagnosis: Muscle weakness (generalized)  (M62.81);Pain Pain - Right/Left: Left Pain - part of body: Arm                Time: 2992-4268 OT Time Calculation (min): 27 min Charges:  OT General Charges $OT Visit: 1 Visit OT Evaluation $OT Eval Low Complexity: 1 Low G-Codes:     [place], OTR/L [phone] 10/29/2017  Teiana Hajduk 10/29/2017, 11:25 AM

## 2017-10-29 NOTE — Evaluation (Signed)
Physical Therapy Evaluation Patient Details Name: Wyatt Salas MRN: [id number] DOB: 02-Feb-1951 Today's Date: 10/29/2017   History of Present Illness  pt was admitted for A Fib with RVR.  PMH:  TIB, ETOH, DM, crani.  Had L distal elbow fx last month  Clinical Impression  The patient's BP dropped when sitting at bed edge. Unable to mobilize to [facility]. Patient lives alone with minimal assistance. Pt admitted with above diagnosis. Pt currently with functional limitations due to the deficits listed below (see PT Problem List).  Pt will benefit from skilled PT to increase their independence and safety with mobility to allow discharge to the venue listed below.       Follow Up Recommendations SNF    Equipment Recommendations  None recommended by PT    Recommendations for Other Services       Precautions / Restrictions Precautions Precautions: Fall Precaution Comments: Per Dr [name], ROM elbow OK, splint just removed; follow up xray pending, WBAT, monitor BP- orthostatic Restrictions Weight Bearing Restrictions: No      Mobility  Bed Mobility Overal bed mobility: Modified Independent             General bed mobility comments: from flat bed with extra time and effort, self assisting legs with right hand  Transfers                 General transfer comment: NT due to low BP  Ambulation/Gait                Stairs            Wheelchair Mobility    Modified Rankin (Stroke Patients Only)       Balance Overall balance assessment: Needs assistance;History of Falls Sitting-balance support: Single extremity supported;Feet supported Sitting balance-Leahy Scale: Fair Sitting balance - Comments: touches foot                                     Pertinent Vitals/Pain Pain Assessment: Faces Faces Pain Scale: Hurts even more Pain Location: LUE with movement Pain Intervention(s): Monitored during session;Limited activity within patient's tolerance     Home Living Family/patient expects to be discharged to:: Private residence Living Arrangements: Alone Available Help at Discharge: Friend(s) Type of Home: Apartment         Home Equipment: Hand held shower head;Tub bench;Walker - 2 wheels;Cane - quad;Wheelchair - power;Wheelchair - manual      Prior Function Level of Independence: Independent with assistive device(s)         Comments: uses manual w/c mostly. Walks short distances with quad cane.  Sister gets Chief Executive Officer        Extremity/Trunk Assessment   Upper Extremity Assessment Upper Extremity Assessment: Defer to OT evaluation LUE Deficits / Details: pt has been immobilized in splint until yesterday.  He is unable to lift arm without assistance. Tolerates about a 20 degree range of elbow flexion/extension.  Has wrist drop, and limited ROM of fingers. Unable to grasp or oppose thumb to any digits    Lower Extremity Assessment Lower Extremity Assessment: RLE deficits/detail;LLE deficits/detail RLE Deficits / Details: both legs weakness, uses right hand to self assist legs on and off of the bed.       Communication   Communication: No difficulties  Cognition Arousal/Alertness: Awake/alert Behavior During Therapy: WFL for tasks assessed/performed Overall Cognitive Status: Within Functional Limits  for tasks assessed(during eval)                                        General Comments      Exercises     Assessment/Plan    PT Assessment Patient needs continued PT services  PT Problem List Decreased strength;Decreased range of motion;Decreased knowledge of use of DME;Decreased activity tolerance;Decreased safety awareness;Decreased balance;Decreased knowledge of precautions;Decreased mobility       PT Treatment Interventions DME instruction;Functional mobility training;Therapeutic activities;Therapeutic exercise;Wheelchair mobility training;Patient/family education    PT  Goals (Current goals can be found in the Care Plan section)  Acute Rehab PT Goals Patient Stated Goal: none stated PT Goal Formulation: With patient Time For Goal Achievement: 11/12/17 Potential to Achieve Goals: Fair    Frequency Min 2X/week   Barriers to discharge Decreased caregiver support      Co-evaluation PT/OT/SLP Co-Evaluation/Treatment: Yes Reason for Co-Treatment: For patient/therapist safety PT goals addressed during session: Mobility/safety with mobility OT goals addressed during session: ADL's and self-care       AM-PAC PT "6 Clicks" Daily Activity  Outcome Measure Difficulty turning over in bed (including adjusting bedclothes, sheets and blankets)?: A Little Difficulty moving from lying on back to sitting on the side of the bed? : A Little Difficulty sitting down on and standing up from a chair with arms (e.g., wheelchair, bedside commode, etc,.)?: Unable Help needed moving to and from a bed to chair (including a wheelchair)?: Total Help needed walking in hospital room?: Total Help needed climbing 3-5 steps with a railing? : Total 6 Click Score: 10    End of Session   Activity Tolerance: Treatment limited secondary to medical complications (Comment)(orthostatic) Patient left: in bed;with call bell/phone within reach;with bed alarm set;with nursing/sitter in room Nurse Communication: Mobility status PT Visit Diagnosis: Unsteadiness on feet (R26.81)    Time: 1000-1035 PT Time Calculation (min) (ACUTE ONLY): 35 min   Charges:   PT Evaluation $PT Eval Low Complexity: 1 Low     PT G CodesTresa Endo PT [phone]   Claretha Cooper 10/29/2017, 12:18 PM

## 2017-10-30 LAB — CULTURE, RESPIRATORY

## 2017-10-30 LAB — CULTURE, RESPIRATORY W GRAM STAIN: Culture: NORMAL

## 2017-10-30 LAB — GLUCOSE, CAPILLARY
GLUCOSE-CAPILLARY: 156 mg/dL — AB (ref 65–99)
GLUCOSE-CAPILLARY: 156 mg/dL — AB (ref 65–99)
Glucose-Capillary: 153 mg/dL — ABNORMAL HIGH (ref 65–99)
Glucose-Capillary: 149 mg/dL — ABNORMAL HIGH (ref 65–99)

## 2017-10-30 MED ORDER — GUAIFENESIN-DM 100-10 MG/5ML PO SYRP
5.0000 mL | ORAL_SOLUTION | ORAL | Status: DC | PRN
  Administered 2017-10-30 – 2017-11-15 (×14): 5 mL via ORAL
  Filled 2017-10-30: qty 5
  Filled 2017-10-30: qty 10
  Filled 2017-10-30 (×4): qty 5
  Filled 2017-10-30 (×2): qty 10
  Filled 2017-10-30 (×3): qty 5
  Filled 2017-10-30 (×2): qty 10
  Filled 2017-10-30: qty 5

## 2017-10-30 MED ORDER — PHENOL 1.4 % MT LIQD
1.0000 | OROMUCOSAL | Status: DC | PRN
  Administered 2017-10-30 – 2017-10-31 (×2): 1 via OROMUCOSAL
  Filled 2017-10-30 (×2): qty 177

## 2017-10-30 NOTE — Progress Notes (Signed)
PROGRESS NOTE        PATIENT DETAILS Name: Wyatt Salas Age: 67 y.o. Sex: male Date of Birth: 30-Sep-1950 Admit Date: 11/10/2017 Admitting Physician Vianne Bulls, MD YYQ:MGNOI, [name], MD  Brief Narrative: Patient is a 67 y.o. male with history of alcohol use, seizure disorder, DM-2, hypertension, known lung mass (requested second opinion in May 2018) presented to the ED following a mechanical fall, found to be in A. fib with RVR and admitted to the hospitalist service.  Subjective:  Patient in bed, appears comfortable, denies any headache, no fever, no chest pain or pressure, no shortness of breath , does have some abdominal fullness and pressure. No focal weakness.  Assessment/Plan:  Atrial fibrillation with RVR: Rate controlled-continue metoprolol.  No longer on Cardizem infusion.  Felt to be a poor long-term candidate for anticoagulation given prior history of subdural hematoma, frequent falls, and noncompliance/lack of follow-up with his outpatient MDs.  Echo in March 2018 showed preserved EF.  Continue oral beta-blocker we will add aspirin.  Stool score of at least 2.  Acute urinary retention: On Flomax, failed Foley removal, Foley placed on 10/30/2017 likely will go home with Foley catheter and outpatient urology follow-up.  Lung mass: Apparently was scheduled for a bronchoscopy late last year-but [facility]-he now reports acceptance and willingness to proceed with workup if offered at this time.  Case was discussed with pulmonology-recommendations are to proceed with a thoracocentesis first before proceeding with a flexible bronchoscopy.  Thoracocentesis was performed on 4/12-await cytology.  Patient has poor insight, poor record of compliance.  Also appears to be alcoholic.  Palliative care on board as well so further long-term goals of care.  Hypertension: Stable on beta-blocker will monitor.  Seizure disorder: Continue  Keppra/Dilantin and Vimpat  Left humerus fracture: Due to mechanical fall that the patient had on early March, he was seen in the emergency room on 3/5 and had a splint placed-and he was supposed to follow-up with outpatient orthopedics. He unfortunately never followed up-Ortho was consulted on 4/11- splint was removed on 4/11-it appears patient may have developed a mild left wrist drop -await PT/OT eval and treat.  History of alcohol use: Claims he does not use alcohol any longer-claims that his last use was 1 year back-but he had an admission in this facility and late last year where he had alcohol withdrawal symptoms.  No withdrawal symptoms at this time-continue Ativan per protocol.  Monitor for DTs, continue folic acid and thiamine supplementation.  Counseled to quit.  Deconditioning/debility/frequent falls-await PT evaluation    Telemetry (independently reviewed): Afib  DVT Prophylaxis: Prophylactic Lovenox  Code Status: Full code   Family Communication: None at bedside  Disposition Plan: Remain inpatient-likely transfer to SNF next week  Antimicrobial agents: Anti-infectives (From admission, onward)   None      Procedures: None  CONSULTS:  cardiology  Time spent: 25- minutes-Greater than 50% of this time was spent in counseling, explanation of diagnosis, planning of further management, and coordination of care.  MEDICATIONS: Scheduled Meds: . atorvastatin  40 mg Oral q1800  . docusate sodium  200 mg Oral BID  . enoxaparin (LOVENOX) injection  40 mg Subcutaneous Q24H  . feeding supplement (ENSURE ENLIVE)  237 mL Oral BID BM  . folic acid  1 mg Oral Daily  . insulin  aspart  0-5 Units Subcutaneous QHS  . insulin aspart  0-9 Units Subcutaneous TID WC  . lacosamide  100 mg Oral BID  . levETIRAcetam  1,500 mg Oral BID  . metoprolol tartrate  25 mg Oral BID  . multivitamin with minerals  1 tablet Oral Daily  . phenytoin  30 mg Oral BID  . sodium chloride flush  3  mL Intravenous Q12H  . tamsulosin  0.4 mg Oral Daily  . thiamine  100 mg Oral Daily   Continuous Infusions:  PRN Meds:.acetaminophen **OR** acetaminophen, guaiFENesin-dextromethorphan, HYDROcodone-acetaminophen, ondansetron **OR** ondansetron (ZOFRAN) IV, phenol, polyethylene glycol, polyvinyl alcohol   PHYSICAL EXAM: Vital signs: Vitals:   10/29/17 1842 10/29/17 1845 10/29/17 2139 10/30/17 0554  BP: 107/61 107/61 121/74 122/80  Pulse: 72 72 79 75  Resp: _0 (!) 25  Temp: 98.7 F (37.1 C) 98.7 F (37.1 C) 99.4 F (37.4 C) 98.6 F (37 C)  TempSrc: Oral Oral Oral Oral  SpO2: 94% 94% 95% 96%  Weight:      Height:       Filed Weights   10/27/17 0600 10/27/17 0915 10/28/17 0500  Weight: 68 kg (150 lb) 54.9 kg (121 lb 0.5 oz) 57.4 kg (126 lb 8.7 oz)   Body mass index is 16.7 kg/m.   Exam  Awake Alert,  No new F.N deficits, Normal affect [place].AT,PERRAL Supple Neck,No JVD, No cervical lymphadenopathy appriciated.  Symmetrical Chest wall movement, Good air movement bilaterally, CTAB RRR,No Gallops, Rubs or new Murmurs, No Parasternal Heave +ve B.Sounds, Abd distended with suprapubic fullness, No organomegaly appriciated, No rebound - guarding or rigidity. No Cyanosis, Clubbing or edema, No new Rash or bruise  I have personally reviewed following labs and imaging studies  LABORATORY DATA: CBC: Recent Labs  Lab 11/14/2017 2052 10/28/17 0319 10/29/17 0426  WBC 11.1* 9.5 9.1  NEUTROABS 8.4*  --   --   HGB 12.0* 11.1* 10.8*  HCT 38.0* 36.2* 34.8*  MCV 83.9 83.4 82.3  PLT 391 370 846    Basic Metabolic Panel: Recent Labs  Lab 10/31/2017 2052 10/27/17 1043 10/28/17 0001 10/28/17 0319 10/29/17 0426  NA 143  --  138 133* 135  K 4.1  --  3.5 2.9* 3.8  CL 104  --  101 98* 101  CO2 26  --  _1 GLUCOSE 132*  --  175* 258* 138*  BUN 9  --  _2 CREATININE 0.62  --  0.72 0.62 0.54*  CALCIUM 10.0  --  9.5 8.8* 9.0  MG  --  1.5* 1.9 1.7 1.9     GFR: Estimated Creatinine Clearance: 71.8 mL/min (A) (by C-G formula based on SCr of 0.54 mg/dL (L)).  Liver Function Tests: Recent Labs  Lab 11/11/2017 2052 10/28/17 0319 10/29/17 1046  AST 24 40  --   ALT 18 28  --   ALKPHOS 81 69  --   BILITOT 0.6 0.5  --   PROT 7.8 6.1* 5.9*  ALBUMIN 3.3* 2.5*  --    Recent Labs  Lab 11/03/2017 2052  LIPASE 26   No results for input(s): AMMONIA in the last 168 hours.  Coagulation Profile: Recent Labs  Lab 10/27/17 1043  INR 1.11    Cardiac Enzymes: No results for input(s): CKTOTAL, CKMB, CKMBINDEX, TROPONINI in the last 168 hours.  BNP (last 3 results) No results for input(s): PROBNP in the last 8760 hours.  HbA1C: No results for input(s): HGBA1C in the  last 72 hours.  CBG: Recent Labs  Lab 10/29/17 1131 10/29/17 1608 10/29/17 2137 10/30/17 0741 10/30/17 1141  GLUCAP 125* 125* 141* 153* 149*    Lipid Profile: No results for input(s): CHOL, HDL, LDLCALC, TRIG, CHOLHDL, LDLDIRECT in the last 72 hours.  Thyroid Function Tests: No results for input(s): TSH, T4TOTAL, FREET4, T3FREE, THYROIDAB in the last 72 hours.  Anemia Panel: No results for input(s): VITAMINB12, FOLATE, FERRITIN, TIBC, IRON, RETICCTPCT in the last 72 hours.  Urine analysis:    Component Value Date/Time   COLORURINE AMBER (A) 11/12/2017 2311   APPEARANCEUR CLEAR 11/14/2017 2311   LABSPEC 1.026 10/30/2017 2311   PHURINE 5.0 10/25/2017 2311   GLUCOSEU NEGATIVE 10/21/2017 2311   HGBUR NEGATIVE 11/12/2017 2311   BILIRUBINUR NEGATIVE 10/21/2017 2311   KETONESUR NEGATIVE 11/04/2017 2311   PROTEINUR 30 (A) 11/09/2017 2311   UROBILINOGEN 1.0 01/25/2014 1700   NITRITE NEGATIVE 11/10/2017 2311   LEUKOCYTESUR NEGATIVE 10/28/2017 2311    Sepsis Labs: Lactic Acid, Venous    Component Value Date/Time   LATICACIDVEN 2.0 04/20/2010 1910    MICROBIOLOGY: Recent Results (from the past 240 hour(s))  Urine culture     Status: Abnormal   Collection  Time: 11/06/2017 11:19 PM  Result Value Ref Range Status   Specimen Description   Final    URINE, CATHETERIZED Performed at [facility], [place] [address] [place], [place] [address]    Special Requests   Final    NONE Performed at [facility], [place] [address], [place], [place] [address]    Culture (A)  Final    <10,000 COLONIES/mL INSIGNIFICANT GROWTH Performed at [facility] [address] [place], [place] [address]    Report Status 10/28/2017 FINAL  Final  MRSA PCR Screening     Status: None   Collection Time: 10/27/17  9:21 AM  Result Value Ref Range Status   MRSA by PCR NEGATIVE NEGATIVE Final    Comment:        The GeneXpert MRSA Assay (FDA approved for NASAL specimens only), is one component of a comprehensive MRSA colonization surveillance program. It is not intended to diagnose MRSA infection nor to guide or monitor treatment for MRSA infections. Performed at [facility], [place] [address], [place], [place] [address]   Culture, sputum-assessment     Status: None   Collection Time: 10/27/17  3:30 PM  Result Value Ref Range Status   Specimen Description EXPECTORATED SPUTUM  Final   Special Requests NONE  Final   Sputum evaluation   Final    THIS SPECIMEN IS ACCEPTABLE FOR SPUTUM CULTURE Performed at [facility], [place] [address] [place], [place] [address]    Report Status 10/27/2017 FINAL  Final  Culture, respiratory (NON-Expectorated)     Status: None   Collection Time: 10/27/17  3:30 PM  Result Value Ref Range Status   Specimen Description   Final    EXPECTORATED SPUTUM Performed at [facility], [place] [address], [place], [place] [address]    Special Requests   Final    NONE Reflexed from [id number] Performed at [facility], [place] [address] [place], [place] [address]    Gram Stain   Final    FEW WBC PRESENT,BOTH PMN AND MONONUCLEAR MODERATE  GRAM POSITIVE COCCI    Culture   Final    Consistent with normal respiratory flora. Performed at [facility], [place] [address]  [place], [place] [address]    Report Status 10/30/2017 FINAL  Final  Body fluid culture     Status: None (Preliminary result)   Collection Time: 10/29/17 10:26 AM  Result Value Ref Range Status   Specimen Description   Final    PLEURAL RIGHT Performed at [facility] [address], [place], [place] [address]    Special Requests   Final    NONE Performed at [facility], [place] [address], [place], [place] [address]    Gram Stain   Final    FEW WBC PRESENT, PREDOMINANTLY MONONUCLEAR NO ORGANISMS SEEN    Culture   Final    NO GROWTH < 24 HOURS Performed at [facility] [address] [place], [place] [address]    Report Status PENDING  Incomplete    RADIOLOGY STUDIES/RESULTS: Ct Abdomen Pelvis Wo Contrast  Result Date: 10/31/2017 CLINICAL DATA:  Left shoulder and forehead pain after fall this morning. Lung mass. EXAM: CT CHEST, ABDOMEN, AND PELVIS WITHOUT CONTRAST TECHNIQUE: Multidetector CT imaging of the chest, abdomen and pelvis was performed without IV contrast. CONTRAST:  The patient's IV infiltrated during imaging and the order was changed to CT of the chest, abdomen and pelvis without per ordering clinician. COMPARISON:  PET-CT 05/31/2017 FINDINGS: CT CHEST FINDINGS Cardiovascular: Top-normal size heart with left main and three-vessel coronary arteriosclerosis. Aortic atherosclerosis without aneurysm. The unenhanced pulmonary vasculature is difficult to assess beyond the main pulmonary artery due to soft tissue masses and adenopathy about the right hilum and mediastinum. Mediastinum/Nodes: Interval increase in masslike abnormalities in the mediastinum and right hilum compatible with progression of metastatic adenopathy and/or increase and extension of known right upper lobe mass. Largest lymph node  identified is subcarinal at 2.8 cm. Interval increase in right upper paratracheal lymph node measuring 1.1 cm. Exact margins of additional lymph node masses are difficult to delineate on this unenhanced study. There is luminal narrowing of the distal right mainstem bronchus without occlusion secondary to the right hilar, mediastinal and subcarinal masslike abnormalities. The thyroid gland is unremarkable. The esophagus is not well visualized. Lungs/Pleura: Partially loculated small to moderate right effusion. New pulmonary nodular masslike opacities are noted within the right upper, middle and lower lobes with interval progression in size of spiculated right upper lobe mass now measuring approximately 5.8 x 5.3 x 5 cm. Within the superior segment of the aerated right lower lobe are metastatic nodules measuring up to 2.2 cm. Additional nodules are seen in the right middle and anterior right lower lobe. Left lung remains clear. Nodular thickening along the left major fissure is seen which may represent fluid in the fissure versus an intra fissural mass. This measures 2.2 x 1.2 x 1.7 cm. Peribronchial thickening is noted within both lower lobes. Musculoskeletal: Remote right posterior seventh through ninth rib fractures. No suspicious osseous lesions. Extravasated contrast noted in the soft tissues of the right arm. CT ABDOMEN PELVIS FINDINGS Hepatobiliary: No biliary dilatation. The unenhanced liver is unremarkable. The gallbladder is physiologically distended. Pancreas: No ductal dilatation or inflammation. Spleen: No splenomegaly or mass. Adrenals/Urinary Tract: No adrenal mass. Interpolar 2.5 cm simple right renal cyst. Unremarkable noncontrast appearance of the left kidney. No obstructive uropathy. Marked bladder distention to the umbilicus with the bladder measuring 14.7 x 14.1 x 18.8 cm (volume = 2040 cm^3). Stomach/Bowel: Stomach is within normal limits. Appendix appears normal. No evidence of bowel wall  thickening, distention, or inflammatory changes. Vascular/Lymphatic: Moderate aortoiliac and branch vessel  atherosclerosis. No aneurysm. No adenopathy. Reproductive: Normal size prostate. Other: No free air nor free fluid. Musculoskeletal: Degenerative disc disease L5-S1. No aggressive osseous lesions. IMPRESSION: Extravasation of IV contrast into the patient's right arm. Study was flagged for contrast extravasation radiology PA follow-up should the patient be discharged. Chest CT: 1. Interval increase in spiculated right upper lobe mass now measuring approximately 5.8 x 5.3 x 5 cm with progression of metastatic mediastinal and hilar adenopathy as well as development of satellite pulmonary nodules in the right lung. Slight luminal narrowing of the distal right mainstem bronchus due to adenopathy. No occlusion noted however. 2. Ovoid density in the left major fissure may represent a pseudo lesion with fluid in the fissure versus an intra fissural mass measuring 2.2 x 1.2 x 1.7 cm. 3. Loculated moderate right pleural effusion. Abdomen and pelvic CT: 1. Marked urinary distention with volume of approximately 2 L. 2. Simple right interpolar renal cyst measuring 2.5 cm. 3. No apparent evidence of metastatic disease within the abdomen and pelvis given limitations of a noncontrast exam. Electronically Signed   By: Ashley Royalty M.D.   On: 11/01/2017 22:55   Dg Chest 2 View  Result Date: 10/20/2017 CLINICAL DATA:  Cough, history of lung cancer. EXAM: CHEST - 2 VIEW COMPARISON:  05/18/2017 chest CT, CXR 05/18/2017 FINDINGS: Interval increase in size of masslike opacity in the right upper lobe, best appreciated on the sagittal view measuring approximately 4 x 3.5 x 4.8 cm with right paratracheal and perihilar soft tissue prominence concerning for progression of lymphadenopathy. Superimposed adjacent pneumonia is difficult to entirely exclude. Hyperinflated left lung without pulmonary consolidation or dominant mass. Heart size  is top-normal. No aortic aneurysm. No suspicious osseous lesions. IMPRESSION: 1. Interval increase in size of right upper lobe posterior masslike abnormality now estimated at 4 x 3.5 x 4.8 cm, previously estimated at approximately 3.6 cm. 2. Interval increase in soft tissue prominence about the mediastinum and right hilum compatible with metastatic lymphadenopathy. Superimposed adjacent airspace opacities cannot exclude pneumonia and/or areas of atelectasis. Electronically Signed   By: Ashley Royalty M.D.   On: 11/01/2017 19:37   Dg Elbow Complete Left (3+view)  Result Date: 10/29/2017 CLINICAL DATA:  Elbow fracture. EXAM: LEFT ELBOW - COMPLETE 3+ VIEW; LEFT FOREARM - 2 VIEW COMPARISON:  Left elbow and forearm x-rays dated September 20, 2017. FINDINGS: Left elbow: Unchanged mild cortical step-off along the medial aspect of the capitellum. No new fracture. Persistent elbow joint effusion. Osteopenia. Soft tissues are unremarkable. Left forearm: No radius or ulna fracture. No focal bone lesion. Osteopenia. Soft tissues are unremarkable. IMPRESSION: 1. Unchanged minimally displaced fracture of the capitellum. Persistent elbow joint effusion. 2. No acute abnormality of the forearm. Electronically Signed   By: Titus Dubin M.D.   On: 10/29/2017 13:17   Dg Forearm Left  Result Date: 10/29/2017 CLINICAL DATA:  Elbow fracture. EXAM: LEFT ELBOW - COMPLETE 3+ VIEW; LEFT FOREARM - 2 VIEW COMPARISON:  Left elbow and forearm x-rays dated September 20, 2017. FINDINGS: Left elbow: Unchanged mild cortical step-off along the medial aspect of the capitellum. No new fracture. Persistent elbow joint effusion. Osteopenia. Soft tissues are unremarkable. Left forearm: No radius or ulna fracture. No focal bone lesion. Osteopenia. Soft tissues are unremarkable. IMPRESSION: 1. Unchanged minimally displaced fracture of the capitellum. Persistent elbow joint effusion. 2. No acute abnormality of the forearm. Electronically Signed   By: Titus Dubin M.D.   On: 10/29/2017 13:17   Ct Head Wo Contrast  Result Date: 11/04/2017 CLINICAL DATA:  [address] out of wheelchair today. Left forehead and left shoulder pain. Initial encounter. EXAM: CT HEAD WITHOUT CONTRAST CT CERVICAL SPINE WITHOUT CONTRAST TECHNIQUE: Multidetector CT imaging of the head and cervical spine was performed following the standard protocol without intravenous contrast. Multiplanar CT image reconstructions of the cervical spine were also generated. COMPARISON:  09/21/2017 FINDINGS: CT HEAD FINDINGS Brain: Right MCA territory encephalomalacia is unchanged as is more extensive right temporal lobe encephalomalacia which may be in part postsurgical. There is marked cerebral scratched of there is marked cerebellar atrophy. Periventricular white matter hypodensities are unchanged and nonspecific but compatible with mild chronic small vessel ischemic disease. No acute large territory infarct, intracranial hemorrhage, mass, midline shift, or extra-axial fluid collection is identified. Vascular: Calcified atherosclerosis at the skull base. No hyperdense vessel. Skull: Prior right pterional craniotomy.  No acute fracture. Sinuses/Orbits: The visualized paranasal sinuses and mastoid air cells are clear. The orbits are unremarkable. Other: None. CT CERVICAL SPINE FINDINGS Alignment: Chronic reversal of the normal cervical lordosis. Unchanged grade 1 anterolisthesis of C2 on C3 and C3 on C4. Skull base and vertebrae: No acute fracture or suspicious osseous lesion. Soft tissues and spinal canal: No prevertebral fluid or swelling. No visible canal hematoma. Disc levels: Moderate diffuse cervical disc degeneration and moderate to severe left greater than right mid upper cervical facet arthrosis resulting in advanced multilevel neural foraminal stenosis. Upper chest: Right pleural effusion. Unchanged 3 mm left apical lung nodule. Other: Moderate calcified atherosclerosis at the carotid bifurcations.  IMPRESSION: 1. No evidence of acute intracranial abnormality. 2. Right cerebral hemisphere encephalomalacia. Severe cerebellar atrophy. 3. No acute cervical spine fracture. Electronically Signed   By: Logan Bores M.D.   On: 10/28/2017 19:51   Ct Chest Wo Contrast  Result Date: 11/08/2017 CLINICAL DATA:  Left shoulder and forehead pain after fall this morning. Lung mass. EXAM: CT CHEST, ABDOMEN, AND PELVIS WITHOUT CONTRAST TECHNIQUE: Multidetector CT imaging of the chest, abdomen and pelvis was performed without IV contrast. CONTRAST:  The patient's IV infiltrated during imaging and the order was changed to CT of the chest, abdomen and pelvis without per ordering clinician. COMPARISON:  PET-CT 05/31/2017 FINDINGS: CT CHEST FINDINGS Cardiovascular: Top-normal size heart with left main and three-vessel coronary arteriosclerosis. Aortic atherosclerosis without aneurysm. The unenhanced pulmonary vasculature is difficult to assess beyond the main pulmonary artery due to soft tissue masses and adenopathy about the right hilum and mediastinum. Mediastinum/Nodes: Interval increase in masslike abnormalities in the mediastinum and right hilum compatible with progression of metastatic adenopathy and/or increase and extension of known right upper lobe mass. Largest lymph node identified is subcarinal at 2.8 cm. Interval increase in right upper paratracheal lymph node measuring 1.1 cm. Exact margins of additional lymph node masses are difficult to delineate on this unenhanced study. There is luminal narrowing of the distal right mainstem bronchus without occlusion secondary to the right hilar, mediastinal and subcarinal masslike abnormalities. The thyroid gland is unremarkable. The esophagus is not well visualized. Lungs/Pleura: Partially loculated small to moderate right effusion. New pulmonary nodular masslike opacities are noted within the right upper, middle and lower lobes with interval progression in size of spiculated  right upper lobe mass now measuring approximately 5.8 x 5.3 x 5 cm. Within the superior segment of the aerated right lower lobe are metastatic nodules measuring up to 2.2 cm. Additional nodules are seen in the right middle and anterior right lower lobe. Left lung remains clear. Nodular thickening along the  left major fissure is seen which may represent fluid in the fissure versus an intra fissural mass. This measures 2.2 x 1.2 x 1.7 cm. Peribronchial thickening is noted within both lower lobes. Musculoskeletal: Remote right posterior seventh through ninth rib fractures. No suspicious osseous lesions. Extravasated contrast noted in the soft tissues of the right arm. CT ABDOMEN PELVIS FINDINGS Hepatobiliary: No biliary dilatation. The unenhanced liver is unremarkable. The gallbladder is physiologically distended. Pancreas: No ductal dilatation or inflammation. Spleen: No splenomegaly or mass. Adrenals/Urinary Tract: No adrenal mass. Interpolar 2.5 cm simple right renal cyst. Unremarkable noncontrast appearance of the left kidney. No obstructive uropathy. Marked bladder distention to the umbilicus with the bladder measuring 14.7 x 14.1 x 18.8 cm (volume = 2040 cm^3). Stomach/Bowel: Stomach is within normal limits. Appendix appears normal. No evidence of bowel wall thickening, distention, or inflammatory changes. Vascular/Lymphatic: Moderate aortoiliac and branch vessel atherosclerosis. No aneurysm. No adenopathy. Reproductive: Normal size prostate. Other: No free air nor free fluid. Musculoskeletal: Degenerative disc disease L5-S1. No aggressive osseous lesions. IMPRESSION: Extravasation of IV contrast into the patient's right arm. Study was flagged for contrast extravasation radiology PA follow-up should the patient be discharged. Chest CT: 1. Interval increase in spiculated right upper lobe mass now measuring approximately 5.8 x 5.3 x 5 cm with progression of metastatic mediastinal and hilar adenopathy as well as  development of satellite pulmonary nodules in the right lung. Slight luminal narrowing of the distal right mainstem bronchus due to adenopathy. No occlusion noted however. 2. Ovoid density in the left major fissure may represent a pseudo lesion with fluid in the fissure versus an intra fissural mass measuring 2.2 x 1.2 x 1.7 cm. 3. Loculated moderate right pleural effusion. Abdomen and pelvic CT: 1. Marked urinary distention with volume of approximately 2 L. 2. Simple right interpolar renal cyst measuring 2.5 cm. 3. No apparent evidence of metastatic disease within the abdomen and pelvis given limitations of a noncontrast exam. Electronically Signed   By: Ashley Royalty M.D.   On: 10/25/2017 22:55   Ct Cervical Spine Wo Contrast  Result Date: 11/06/2017 CLINICAL DATA:  [address] out of wheelchair today. Left forehead and left shoulder pain. Initial encounter. EXAM: CT HEAD WITHOUT CONTRAST CT CERVICAL SPINE WITHOUT CONTRAST TECHNIQUE: Multidetector CT imaging of the head and cervical spine was performed following the standard protocol without intravenous contrast. Multiplanar CT image reconstructions of the cervical spine were also generated. COMPARISON:  09/21/2017 FINDINGS: CT HEAD FINDINGS Brain: Right MCA territory encephalomalacia is unchanged as is more extensive right temporal lobe encephalomalacia which may be in part postsurgical. There is marked cerebral scratched of there is marked cerebellar atrophy. Periventricular white matter hypodensities are unchanged and nonspecific but compatible with mild chronic small vessel ischemic disease. No acute large territory infarct, intracranial hemorrhage, mass, midline shift, or extra-axial fluid collection is identified. Vascular: Calcified atherosclerosis at the skull base. No hyperdense vessel. Skull: Prior right pterional craniotomy.  No acute fracture. Sinuses/Orbits: The visualized paranasal sinuses and mastoid air cells are clear. The orbits are unremarkable.  Other: None. CT CERVICAL SPINE FINDINGS Alignment: Chronic reversal of the normal cervical lordosis. Unchanged grade 1 anterolisthesis of C2 on C3 and C3 on C4. Skull base and vertebrae: No acute fracture or suspicious osseous lesion. Soft tissues and spinal canal: No prevertebral fluid or swelling. No visible canal hematoma. Disc levels: Moderate diffuse cervical disc degeneration and moderate to severe left greater than right mid upper cervical facet arthrosis resulting in advanced multilevel neural  foraminal stenosis. Upper chest: Right pleural effusion. Unchanged 3 mm left apical lung nodule. Other: Moderate calcified atherosclerosis at the carotid bifurcations. IMPRESSION: 1. No evidence of acute intracranial abnormality. 2. Right cerebral hemisphere encephalomalacia. Severe cerebellar atrophy. 3. No acute cervical spine fracture. Electronically Signed   By: Logan Bores M.D.   On: 11/14/2017 19:51   Dg Chest Port 1 View  Result Date: 10/29/2017 CLINICAL DATA:  Status post thoracentesis. EXAM: PORTABLE CHEST 1 VIEW COMPARISON:  Chest radiograph 11/04/2017 FINDINGS: Negative for pneumothorax following thoracentesis. There may be a small amount of right pleural fluid present. Extensive airspace densities throughout the right lung particularly in the perihilar regions. The degree of right lung consolidation may have slightly increased. Left lung remains clear. Heart size is grossly stable. Old right rib fractures. IMPRESSION: Negative for pneumothorax following thoracentesis. Extensive airspace disease and consolidation in the right lung and cannot exclude progression since prior examination. Electronically Signed   By: Markus Daft M.D.   On: 10/29/2017 10:23   Dg Shoulder Left  Result Date: 10/24/2017 CLINICAL DATA:  [place] striking the left side today. Recent distal humeral fracture. EXAM: LEFT SHOULDER - 2+ VIEW COMPARISON:  Left humerus series of Nov 17, 2009 FINDINGS: There is chronic deformity of the  distal clavicle. No acute clavicular fracture is observed. The scapula and proximal humerus appear normal. There is narrowing of the glenohumeral joint space. IMPRESSION: There is no acute fracture or dislocation of the left shoulder. 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 There is chronic deformity of the distal clavicle. Electronically Signed   By: David  Martinique M.D.   On: 10/30/2017 11:20     LOS: 3 days   Signature  Lala Lund M.D on 10/30/2017 at 1:24 PM  Between 7am to 7pm - Pager - [phone] ( page via [email or web address], text pages only, please mention full 10 digit call back number).  After 7pm go to [email or web address] - password [email or web address]

## 2017-10-30 NOTE — Consult Note (Signed)
Consultation Note Date: 10/30/2017   Patient Name: Wyatt Salas  DOB: [birth year]  MRN: [id number]  Age / Sex: 67 y.o., male  PCP: Wyatt Lema, MD Referring Physician: Thurnell Lose, MD  Reason for Consultation: Establishing goals of care  HPI/Patient Profile: 67 y.o. 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 fib with RVR.   Clinical Assessment and Goals of Care: I met today with Wyatt Salas.  I introduced palliative care as specialized medical care for people living with serious illness. It focuses on providing relief from the symptoms and stress of a serious illness. The goal is to improve quality of life for both the patient and the family.  Reports that the physicians have been doing a good job explaining things to him.  He understands that he has a large lesion in his chest that is likely cancerous but states that this is "sometimes still really hard to believe."  He reports that the most important thing to him is his church and relationship with God.  He relies heavily on his pastor to help him with decision making and also relies heavily on his church to help support him emotionally.  He also reports that his family is important to them but they have "issues" that he does not elaborate on.  He reports having one biologic son and also having 2 other children "that I claim...sometimes" who were stepchildren from prior marriage  We reviewed his clinical course over the past several months including continued decline in nutrition and functional status.  He reports understanding that we are waiting for results of thoracentesis  and also potential for further testing in order to determine exact etiology of lesion noted on imaging.  Reports to me today that he is "ready to get started" with treatment if this is in fact a cancerous lesion.  SUMMARY OF RECOMMENDATIONS   -Patient would like to wait till he gets more information prior to having other long-term goals of care conversations.  We discussed the fact we are waiting for results of thoracentesis and also possibility of need for bronchoscopy. -He remains full code for now -He reports having "issues" with some of his family.  He heavily relies on his church for support.  He is trying to decide between his pastor, his son, and another friend as his surrogate decision maker if he cannot make his own decisions.  He is agreeable to talking with chaplain services about establishing advanced directive and would like more information about this prior to making a final decision whom he would like to name is his surrogate.  I will place a referral to chaplain services for assistance in facilitating this.  Code Status/Advance Care Planning:  Full code- Did not address today  Palliative Prophylaxis:   Frequent Pain Assessment  Additional Recommendations (Limitations, Scope, Preferences):  Full Scope Treatment  Psycho-social/Spiritual:   Desire for further Chaplaincy support:yes  Additional Recommendations: Caregiving  Support/Resources  Prognosis:   Unable to determine  Discharge Planning: To Be Determined      Primary Diagnoses: Present on Admission: . Essential hypertension . Type 2 diabetes mellitus with hyperlipidemia ([place]) . Lung mass . Acute urinary retention . Atrial fibrillation with RVR ([place]) . Chronic diastolic CHF (congestive heart failure) ([place]) . Closed fracture of distal end of left humerus   I have reviewed the medical record, interviewed the patient and family, and examined the patient. The following aspects are pertinent.  Past Medical  History:  Diagnosis Date  . Diabetes mellitus without complication ([place])    Type II  . Diastolic dysfunction    a. ECHO 07/2013 EF 55-60% and grade 1 diastolic dysfunction  . NSTEMI (non-ST elevated myocardial infarction) ([place])    problem list 01/2014  . Rhabdomyolysis 01/2014  . Seizures ([place])   . Syncope 01/2014  . Traumatic brain injury [facility])    Social History   Socioeconomic History  . Marital status: Divorced    Spouse name: Not on file  . Number of children: Not on file  . Years of education: Not on file  . Highest education level: Not on file  Occupational History  . Not on file  Social Needs  . Financial resource strain: Not on file  . Food insecurity:    Worry: Not on file    Inability: Not on file  . Transportation needs:    Medical: Not on file    Non-medical: Not on file  Tobacco Use  . Smoking status: Former Smoker    Types: Cigars  . Smokeless tobacco: Never Used  Substance and Sexual Activity  . Alcohol use: No    Frequency: Never  . Drug use: Yes    Types: Marijuana  . Sexual activity: Not on file  Lifestyle  . Physical activity:    Days per week: Not on file    Minutes per session: Not on file  . Stress: Not on file  Relationships  . Social connections:    Talks on phone: Not on file    Gets together: Not on file    Attends religious service: Not on file    Active member of club or organization: Not on file    Attends meetings of clubs or organizations: Not on file    Relationship status: Not on file  Other Topics Concern  . Not on file  Social History Narrative  . Not on file   Family History  Problem Relation Age of Onset  . Headache Mother   . Diabetes Neg Hx   . Stroke Neg Hx    Scheduled Meds: . atorvastatin  40 mg Oral q1800  . docusate sodium  200 mg Oral BID  . enoxaparin (LOVENOX) injection  40 mg Subcutaneous Q24H  . feeding supplement (ENSURE ENLIVE)  237 mL Oral BID BM  . folic acid  1 mg Oral Daily  . insulin aspart  0-5  Units Subcutaneous QHS  . insulin aspart  0-9 Units Subcutaneous TID WC  . lacosamide  100 mg Oral BID  . levETIRAcetam  1,500 mg Oral BID  . metoprolol tartrate  25 mg Oral BID  . multivitamin with minerals  1 tablet Oral Daily  . phenytoin  30 mg Oral BID  . sodium chloride flush  3 mL Intravenous Q12H  . tamsulosin  0.4 mg Oral Daily  . thiamine  100 mg Oral Daily   Continuous Infusions: PRN Meds:.acetaminophen **OR** acetaminophen, HYDROcodone-acetaminophen, LORazepam **OR** LORazepam, ondansetron **OR** ondansetron (ZOFRAN) IV, phenol, polyethylene glycol, polyvinyl alcohol Medications Prior to Admission:  Prior to Admission medications   Medication Sig Start Date End Date Taking? Authorizing Provider  acetaminophen (TYLENOL) 500 MG tablet Take 1 tablet (500 mg total) by mouth every 6 (six) hours as needed. Patient taking differently: Take 500 mg by mouth 2 (two) times daily.  10/05/17  Yes Burky, Lanelle Bal B, NP  docusate sodium (COLACE) 100 MG capsule Take 200 mg by mouth 2 (two) times daily.   Yes [provider]  ENSURE PLUS (ENSURE PLUS) LIQD Take 237 mLs by mouth 2 (two) times daily between meals.   Yes [provider]  folic acid (FOLVITE) 1 MG tablet Take 1 tablet (1 mg total) daily by mouth. 05/25/17  Yes Barton Dubois, MD  lacosamide (VIMPAT) 50 MG TABS tablet Take 100 mg by mouth 2 (two) times daily.   Yes [provider]  levETIRAcetam (KEPPRA) 750 MG tablet Take 1,500 mg by mouth 2 (two) times daily.   Yes [provider]  lisinopril (PRINIVIL,ZESTRIL) 10 MG tablet Take 10 mg by mouth 2 (two) times daily.   Yes [provider]  metFORMIN (GLUCOPHAGE) 500 MG tablet Take 500 mg by mouth daily with breakfast.    Yes [provider]  Multiple Vitamin (MULTIVITAMIN WITH MINERALS) TABS tablet Take 1 tablet by mouth daily.   Yes [provider]  phenytoin (DILANTIN) 30 MG ER capsule Take 1 capsule (30 mg total) by mouth  2 (two) times daily. 10/15/16 05/27/18 Yes Arrien, [name], MD  simvastatin (ZOCOR) 80 MG tablet Take 80 mg at bedtime by mouth.    Yes [provider]  thiamine 100 MG tablet Take 1 tablet (100 mg total) daily by mouth. 05/25/17  Yes Barton Dubois, MD  carboxymethylcellulose (REFRESH PLUS) 0.5 % SOLN Place 1 drop into both eyes 2 (two) times daily as needed (Dry eyes).    [provider]  galantamine (RAZADYNE) 8 MG tablet Take 1 tablet (8 mg total) by mouth 2 (two) times daily with a meal. Patient not taking: Reported on 10/21/2017 07/31/13   [name], MD  HYDROcodone-acetaminophen (NORCO) 5-325 MG tablet Take 1 tablet by mouth every 6 (six) hours as needed for severe pain. Patient not taking: Reported on 10/20/2017 09/13/17   Julianne Rice, MD   Allergies  Allergen Reactions  . Shellfish Allergy Rash   Review of Systems  Constitutional: Positive for activity change, appetite change and fatigue.  Musculoskeletal: Positive for arthralgias.  Neurological: Positive for weakness.  Psychiatric/Behavioral: Positive for sleep disturbance.   Physical Exam  General: Alert, awake, in no acute distress. Cachectic and chronically ill appearing HEENT: No bruits, no goiter, no JVD, poor dentition Heart: Regular rate.. No murmur appreciated. Lungs: Dminished, esp RLL Abdomen: Soft, nontender, nondistended, positive bowel sounds.  Ext: No significant edema Skin: Warm and dry Neuro: Grossly intact, nonfocal.   Vital Signs: BP 122/80 (BP Location: Right Wrist)   Pulse 75   Temp 98.6 F (37 C) (Oral)   Resp (!) 25   Ht _0  (1.854 m)   Wt 57.4 kg (126 lb 8.7 oz)   SpO2 96%   BMI 16.70 kg/m  Pain Scale: 0-10 POSS *See Group Information*: 1-Acceptable,Awake and alert Pain Score: Asleep   SpO2: SpO2: 96 % O2 Device:SpO2: 96 % O2  Flow Rate: .O2 Flow Rate (L/min): 2 L/min  IO: Intake/output summary:   Intake/Output Summary (Last 24 hours) at 10/30/2017  0958 Last data filed at 10/30/2017 0600 Gross per 24 hour  Intake 600 ml  Output 1350 ml  Net -750 ml    LBM: Last BM Date: 10/28/17(per pt) Baseline Weight: Weight: 68 kg (150 lb) Most recent weight: Weight: 57.4 kg (126 lb 8.7 oz)     Palliative Assessment/Data:   Flowsheet Rows     Most Recent Value  Intake Tab  Referral Department  Hospitalist  Unit at Time of Referral  Med/Surg Unit  Palliative Care Primary Diagnosis  Cancer  Date Notified  10/28/17  Palliative Care Type  New Palliative care  Reason for referral  Clarify Goals of Care  Date of Admission  10/28/2017  Date first seen by Palliative Care  10/29/17  # of days Palliative referral response time  1 Day(s)  # of days IP prior to Palliative referral  2  Clinical Assessment  Palliative Performance Scale Score  60%  Pain Max last 24 hours  6  Pain Min Last 24 hours  0  Psychosocial & Spiritual Assessment  Palliative Care Outcomes  Patient/Family meeting held?  Yes  Who was at the meeting?  patient  Palliative Care Outcomes  Clarified goals of care      Time Total: 60 minutes Greater than 50%  of this time was spent counseling and coordinating care related to the above assessment and plan.  Signed by: Micheline Rough, MD   Please contact Palliative Medicine Team phone at [phone] for questions and concerns.  For individual provider: See Shea Evans

## 2017-10-30 NOTE — Progress Notes (Signed)
New xrays of elbow reviewed.  Fracture unchanged.  WBAT LUE.  ROM as tolerated to left elbow.  Cock up wrist splint.

## 2017-10-30 NOTE — Progress Notes (Signed)
Patient has not voided since being in and out cathed at 1 a.m., assisted patient to [facility] where patient attempted to void without success.  Bladder scan read >200 ccs, Dr. Candiss Norse in room.  Foley catheter inserted, immediately received 1000 ccs light yellow urine, clamped at 1000.

## 2017-10-30 NOTE — Plan of Care (Signed)
Patient stable during 7 a to 7 p shift, medicated for pain in left arm with some improvement.  Patient unable to void spontaneously, foley catheter reinserted this shift (see prior note).  Patients POA was in to visit today, very concerned that patient has not made any follow-ups at [facility].  Patient says the [facility] keeps rescheduling his appointments for follow-up.   Patient is adamant that he wants all his follow-up and further testing for possible lung CA to be done at the [place].

## 2017-10-30 NOTE — Plan of Care (Signed)

## 2017-10-30 NOTE — Progress Notes (Signed)
Occupational Therapy Treatment Patient Details Name: Wyatt Salas MRN: [id number] DOB: [birth year] Today's Date: 10/30/2017    History of present illness pt was admitted for A Fib with RVR.  PMH:  TIB, ETOH, DM, crani.  Had L distal elbow fx last month   OT comments  Pt sat up briefly and needed assistance today.  Improved ROM/strength in LUE from yesterday. He may benefit from L wrist cock up splint to position arm for better hand use  Follow Up Recommendations  SNF    Equipment Recommendations  3 in 1 bedside commode    Recommendations for Other Services      Precautions / Restrictions Precautions Precautions: Fall Precaution Comments: Per Dr [name], ROM OK, splint just removed; follow up xray pending, WBAT, monitor BP- orthostatic Restrictions Weight Bearing Restrictions: No       Mobility Bed Mobility Overal bed mobility: Needs Assistance Bed Mobility: Supine to Sit     Supine to sit: Min assist Sit to supine: Modified independent (Device/Increase time)   General bed mobility comments: extra time; [facility] raised  Transfers                 General transfer comment: pt declined OOB; up to [facility] earlier per nursing    Balance     Sitting balance-Leahy Scale: Fair Sitting balance - Comments: only sat up approximately 30 seconds then initiated lying back down                                   ADL either performed or assessed with clinical judgement   ADL                                         General ADL Comments: able to open deodorant using L as functional assist     Vision       Perception     Praxis      Cognition Arousal/Alertness: Awake/alert Behavior During Therapy: WFL for tasks assessed/performed Overall Cognitive Status: No family/caregiver present to determine baseline cognitive functioning                                 General Comments: mostly wfls; answers "a little bit" to a lot of  questions        Exercises Other Exercises Other Exercises: pt has been stretching arm out. elbow now 90 flexion to -30 full extension Other Exercises: tolerated supination to 30 degrees Other Exercises: able to lift wrist to neutral but cannot maintain this--may benefit from wrist cock up splint Other Exercises: 3/4 ROM in finger flexion/fist with full extension.  Has thumb abduction/adduction and lateral pinch; tip pinch difficult Other Exercises: distal support for FF to 80 degrees   Shoulder Instructions       General Comments      Pertinent Vitals/ Pain       Faces Pain Scale: Hurts even more Pain Location: LUE with movement Pain Intervention(s): Limited activity within patient's tolerance;Monitored during session;Premedicated before session;Repositioned  Home Living  Prior Functioning/Environment              Frequency  Min 2X/week        Progress Toward Goals  OT Goals(current goals can now be found in the care plan section)  Progress towards OT goals: Progressing toward goals     Plan      Co-evaluation                 AM-PAC PT "6 Clicks" Daily Activity     Outcome Measure   Help from another person eating meals?: A Little Help from another person taking care of personal grooming?: A Little Help from another person toileting, which includes using toliet, bedpan, or urinal?: A Little Help from another person bathing (including washing, rinsing, drying)?: A Little Help from another person to put on and taking off regular upper body clothing?: A Little Help from another person to put on and taking off regular lower body clothing?: A Little 6 Click Score: 18    End of Session    OT Visit Diagnosis: Muscle weakness (generalized) (M62.81);Pain Pain - Right/Left: Left Pain - part of body: Arm   Activity Tolerance Patient tolerated treatment well   Patient Left in bed;with bed alarm  set   Nurse Communication          Time: 1443-1540 OT Time Calculation (min): 20 min  Charges: OT General Charges $OT Visit: 1 Visit OT Treatments $Therapeutic Activity: 8-22 mins  [name], OTR/L [phone] 10/30/2017   [name] 10/30/2017, 3:20 PM

## 2017-10-31 LAB — COMPREHENSIVE METABOLIC PANEL
ALT: 33 U/L (ref 17–63)
AST: 38 U/L (ref 15–41)
Albumin: 2.3 g/dL — ABNORMAL LOW (ref 3.5–5.0)
Alkaline Phosphatase: 75 U/L (ref 38–126)
Anion gap: 10 (ref 5–15)
BILIRUBIN TOTAL: 0.2 mg/dL — AB (ref 0.3–1.2)
BUN: 10 mg/dL (ref 6–20)
CHLORIDE: 98 mmol/L — AB (ref 101–111)
CO2: 24 mmol/L (ref 22–32)
CREATININE: 0.56 mg/dL — AB (ref 0.61–1.24)
Calcium: 9 mg/dL (ref 8.9–10.3)
Glucose, Bld: 162 mg/dL — ABNORMAL HIGH (ref 65–99)
POTASSIUM: 4.6 mmol/L (ref 3.5–5.1)
Sodium: 132 mmol/L — ABNORMAL LOW (ref 135–145)
TOTAL PROTEIN: 6 g/dL — AB (ref 6.5–8.1)

## 2017-10-31 LAB — CBC
HCT: 36.2 % — ABNORMAL LOW (ref 39.0–52.0)
Hemoglobin: 11.3 g/dL — ABNORMAL LOW (ref 13.0–17.0)
MCH: 25.7 pg — AB (ref 26.0–34.0)
MCHC: 31.2 g/dL (ref 30.0–36.0)
MCV: 82.3 fL (ref 78.0–100.0)
PLATELETS: 328 10*3/uL (ref 150–400)
RBC: 4.4 MIL/uL (ref 4.22–5.81)
RDW: 15.2 % (ref 11.5–15.5)
WBC: 9.7 10*3/uL (ref 4.0–10.5)

## 2017-10-31 LAB — GLUCOSE, CAPILLARY
GLUCOSE-CAPILLARY: 179 mg/dL — AB (ref 65–99)
GLUCOSE-CAPILLARY: 125 mg/dL — AB (ref 65–99)
GLUCOSE-CAPILLARY: 180 mg/dL — AB (ref 65–99)
Glucose-Capillary: 156 mg/dL — ABNORMAL HIGH (ref 65–99)

## 2017-10-31 LAB — MAGNESIUM: MAGNESIUM: 1.7 mg/dL (ref 1.7–2.4)

## 2017-10-31 MED ORDER — SODIUM CHLORIDE 0.9 % IV SOLN
INTRAVENOUS | Status: AC
  Administered 2017-10-31 (×2): via INTRAVENOUS

## 2017-10-31 MED ORDER — ASPIRIN 81 MG PO CHEW
81.0000 mg | CHEWABLE_TABLET | Freq: Every day | ORAL | Status: DC
  Administered 2017-10-31 – 2017-11-14 (×15): 81 mg via ORAL
  Filled 2017-10-31 (×17): qty 1

## 2017-10-31 MED ORDER — MAGNESIUM SULFATE IN D5W 1-5 GM/100ML-% IV SOLN
1.0000 g | Freq: Once | INTRAVENOUS | Status: AC
  Administered 2017-10-31: 1 g via INTRAVENOUS
  Filled 2017-10-31: qty 100

## 2017-10-31 NOTE — Clinical Social Work Note (Signed)
Clinical Social Work Assessment  Patient Details  Name: Wyatt Salas MRN: [id number] Date of Birth: 1950/12/20  Date of referral:  10/31/17               Reason for consult:  (no formal consult)                Permission sought to share information with:  Chartered certified accountant granted to share information::  Yes, Verbal Permission Granted  Name::     pastor Broadus John, sister Ivin Booty  Agency::  [facility]  Relationship::     Contact Information:     Housing/Transportation Living arrangements for the past 2 months:  Tax adviser of Information:  Patient Patient Interpreter Needed:  None Criminal Activity/Legal Involvement Pertinent to Current Situation/Hospitalization:  No - Comment as needed Significant Relationships:  Friend, [employer], Siblings Lives with:  Self Do you feel safe going back to the place where you live?  Yes Need for family participation in patient care:  No (Coment)  Care giving concerns:  Pt lives alone in apartment, has had ambulation issues for some time, and a few months ago was able to use walker primarily. Reports now he mostly uses Wheelchair and has been able to transfer in and out alone. [place] out of wheelchair and fractured elbow about a month ago he states. Pt has TBI and reports he is disabled. Reports he has been falling more frequently and in general his health has declined in past few months. Has PCP at [facility].  Has lung mass and apparently declined work up last year. Now reports he "wants to treat it if it's cancer." Has long history of alcohol use, denies any currently. Has had Home Health through [facility] in the past, CSW unsure if he currently does.  Pt has sister who lives locally, reports his pastor/church family and his friend are his main supports.   Social Worker assessment / plan:  CSW met with pt to assist with disposition planning. Pt known to CSW from previous hospital encounter at [facility]  05/2017. At that time pt was discharged to [facility] for ST rehab. Pt reports he does not recall going to SNF. Pt was alert and oriented but needed some redirection as pt would interrupt CSW to ask irrelevant questions. During last encounter, pt would repeat circumstantial stories to CSW. Pt did demonstrate understanding that SNF for rehab is recommended again, and he states he wants to pursue this, although he is still "waiting on some test results about the lungs." Pt states he will follow up outpatient with ortho re: arm fracture. Pt has known hx of noncompliance with medical treatment follow up. Review of his and past record indicates his reports are not always consistent. Explained SNF referral and [facility] authorization process. Pt reports understanding and states he has no facility preference at this time. CSW obtained pre-existing PASSR # from [place] must (noted that pt's birthday in [place] must system is 1953 rather than [birth year]- pt reports birthday 76 but he does not have his military ID card with him at hospital for CSW to fax to [place] must in order for their system to be able to change birthdate)  Made referrals to area SNFs and will follow up with bed offers and coordinate insurance auth request and continue to follow for pt's medical treatment plan to assist with disposition planning.    Employment status:  Disabled (Comment on whether or not currently receiving Disability)(receives disability) Insurance information:  Managed Medicare([employer] Medicare) PT Recommendations:  [place] / Referral to community resources:  [place]  Patient/Family's Response to care:  Pt appreciative  Patient/Family's Understanding of and Emotional Response to Diagnosis, Current Treatment, and Prognosis:  Pt demonstrates understanding of current treatment and potential plans, however he was poor historian, causing CSW to question full understanding. As noted  above, he also was fixated on some irrelevant information. However, pt does report his goal with rehab would be "to get up and around as much as I can again." Unable to state long term goal yet as he states he is awaiting treatment options. Emotionally somewhat flat  Emotional Assessment Appearance:  Appears stated age Attitude/Demeanor/Rapport:  Engaged Affect (typically observed):  Flat Orientation:  Oriented to Self, Oriented to Place, Oriented to  Time, Oriented to Situation Alcohol / Substance use:  Alcohol Use(hx of etoh use, denies current) Psych involvement (Current and /or in the community):  No (Comment)  Discharge Needs  Concerns to be addressed:  Decision making concerns, Discharge Planning Concerns Readmission within the last 30 days:  No Current discharge risk:  Dependent with Mobility, Lives alone Barriers to Discharge:  Continued Medical Work up, [employer], LCSW 10/31/2017, 2:39 PM  [phone]

## 2017-10-31 NOTE — Plan of Care (Signed)
Vss, patient medicated for left arm pain x 2 this shift with some improvement.  Ortho tech applied cock up wrist splint as per order, patient states this is an improvement.

## 2017-10-31 NOTE — Progress Notes (Signed)
PROGRESS NOTE        PATIENT DETAILS Name: Wyatt Salas Age: 67 y.o. Sex: male Date of Birth: 03/24/1951 Admit Date: 10/25/2017 Admitting Physician Vianne Bulls, MD FAO:[id number], [name], MD  Brief Narrative: Patient is a 67 y.o. male with history of alcohol use, seizure disorder, DM-2, hypertension, known lung mass (requested second opinion in May 2018) presented to the ED following a mechanical fall, found to be in A. fib with RVR and admitted to the hospitalist service.  Subjective: Patient in bed, appears comfortable, denies any headache, no fever, no chest pain or pressure, no shortness of breath , no abdominal pain. No focal weakness.   Assessment/Plan:  Atrial fibrillation with RVR: [place] vas 2 score of 2.  Poor candidate for anticoagulation due to history of alcohol abuse and falls and subdural hematoma in the past, also noncompliant.  Initially required Cardizem infusion now in rate control on metoprolol which will be continued, have added 81 mg of aspirin.  Recent echocardiogram in March 2018 shows a preserved EF of 55%.   Acute urinary retention: On Flomax, failed Foley removal, Foley placed on 10/30/2017 likely will go to SNF with Foley catheter and outpatient urology follow-up.  Lung mass: Apparently was scheduled for a bronchoscopy late last year-but [facility]-he now reports acceptance and willingness to proceed with workup if offered at this time.  Case was discussed with pulmonology-recommendations are to proceed with a thoracocentesis first before proceeding with a flexible bronchoscopy.  Thoracocentesis was performed on 4/12-await cytology.  Patient has poor insight, poor record of compliance.  Also appears to be alcoholic.  Palliative care on board as well so further long-term goals of care.  Hypertension: Stable on beta-blocker will monitor.  Seizure disorder: Continue Keppra/Dilantin and Vimpat  Left humerus fracture:  Due to mechanical fall that the patient had on early March, he was seen in the emergency room on 3/5 and had a splint placed-and he was supposed to follow-up with outpatient orthopedics. He unfortunately never followed up-Ortho was consulted on 4/11- splint was removed on 4/11-PT OT and also are following.  Wrist splint ordered.  Per Bobette Mo outpatient follow-up with Dr. [name] post discharge in a week.  History of alcohol use: Claims he does not use alcohol any longer-claims that his last use was 1 year back-but he had an admission in this facility and late last year where he had alcohol withdrawal symptoms.  No withdrawal symptoms at this time-continue Ativan per protocol.  Monitor for DTs, continue folic acid and thiamine supplementation.  Counseled to quit.  Deconditioning/debility/frequent falls-await PT evaluation  DM type II.  On sliding scale continue.  CBG (last 3)  Recent Labs    10/30/17 2301 10/31/17 0723 10/31/17 1143  GLUCAP 156* 179* 125*      Telemetry (independently reviewed): Afib  DVT Prophylaxis: Prophylactic Lovenox  Code Status: Full code   Family Communication: None at bedside  Disposition Plan: Remain inpatient-likely transfer to SNF next week  Antimicrobial agents: Anti-infectives (From admission, onward)   None      Procedures: None  CONSULTS:   cardiology  Time spent: 25- minutes-Greater than 50% of this time was spent in counseling, explanation of diagnosis, planning of further management, and coordination of care.  MEDICATIONS: Scheduled Meds: . atorvastatin  40 mg Oral q1800  .  docusate sodium  200 mg Oral BID  . enoxaparin (LOVENOX) injection  40 mg Subcutaneous Q24H  . feeding supplement (ENSURE ENLIVE)  237 mL Oral BID BM  . folic acid  1 mg Oral Daily  . insulin aspart  0-5 Units Subcutaneous QHS  . insulin aspart  0-9 Units Subcutaneous TID WC  . lacosamide  100 mg Oral BID  . levETIRAcetam  1,500 mg Oral BID  . metoprolol  tartrate  25 mg Oral BID  . multivitamin with minerals  1 tablet Oral Daily  . phenytoin  30 mg Oral BID  . sodium chloride flush  3 mL Intravenous Q12H  . tamsulosin  0.4 mg Oral Daily  . thiamine  100 mg Oral Daily   Continuous Infusions: . sodium chloride 75 mL/hr at 10/31/17 0834   PRN Meds:.acetaminophen **OR** acetaminophen, guaiFENesin-dextromethorphan, HYDROcodone-acetaminophen, ondansetron **OR** ondansetron (ZOFRAN) IV, phenol, polyethylene glycol, polyvinyl alcohol   PHYSICAL EXAM: Vital signs: Vitals:   10/30/17 1351 10/30/17 2118 10/31/17 0357 10/31/17 0858  BP: _0 114/69  Pulse: 66 71 73 71  Resp: _1 Temp: 98.4 F (36.9 C) 97.9 F (36.6 C) 98.7 F (37.1 C)   TempSrc: Oral Oral    SpO2: 97% 95% 95% 97%  Weight:   57.6 kg (126 lb 15.8 oz)   Height:       Filed Weights   10/27/17 0915 10/28/17 0500 10/31/17 0357  Weight: 54.9 kg (121 lb 0.5 oz) 57.4 kg (126 lb 8.7 oz) 57.6 kg (126 lb 15.8 oz)   Body mass index is 16.75 kg/m.   Exam  Awake Alert, No new F.N deficits, Normal affect [place].AT,PERRAL Supple Neck,No JVD, No cervical lymphadenopathy appriciated.  Symmetrical Chest wall movement, Good air movement bilaterally, CTAB RRR,No Gallops, Rubs or new Murmurs, No Parasternal Heave +ve B.Sounds, Abd Soft, No tenderness, No organomegaly appriciated, No rebound - guarding or rigidity. No Cyanosis, Clubbing or edema, No new Rash or bruise   I have personally reviewed following labs and imaging studies  LABORATORY DATA: CBC: Recent Labs  Lab 11/07/2017 2052 10/28/17 0319 10/29/17 0426 10/31/17 0422  WBC 11.1* 9.5 9.1 9.7  NEUTROABS 8.4*  --   --   --   HGB 12.0* 11.1* 10.8* 11.3*  HCT 38.0* 36.2* 34.8* 36.2*  MCV 83.9 83.4 82.3 82.3  PLT 391 370 346 761    Basic Metabolic Panel: Recent Labs  Lab 10/25/2017 2052 10/27/17 1043 10/28/17 0001 10/28/17 0319 10/29/17 0426 10/31/17 0422  NA 143  --  138 133* 135 132*  K 4.1  --   3.5 2.9* 3.8 4.6  CL 104  --  101 98* 101 98*  CO2 26  --  _2 GLUCOSE 132*  --  175* 258* 138* 162*  BUN 9  --  _3 CREATININE 0.62  --  0.72 0.62 0.54* 0.56*  CALCIUM 10.0  --  9.5 8.8* 9.0 9.0  MG  --  1.5* 1.9 1.7 1.9 1.7    GFR: Estimated Creatinine Clearance: 72 mL/min (A) (by C-G formula based on SCr of 0.56 mg/dL (L)).  Liver Function Tests: Recent Labs  Lab 11/09/2017 2052 10/28/17 0319 10/29/17 1046 10/31/17 0422  AST 24 40  --  38  ALT 18 28  --  33  ALKPHOS 81 69  --  75  BILITOT 0.6 0.5  --  0.2*  PROT 7.8 6.1* 5.9* 6.0*  ALBUMIN 3.3* 2.5*  --  2.3*   Recent Labs  Lab 11/10/2017 2052  LIPASE 26   No results for input(s): AMMONIA in the last 168 hours.  Coagulation Profile: Recent Labs  Lab 10/27/17 1043  INR 1.11    Cardiac Enzymes: No results for input(s): CKTOTAL, CKMB, CKMBINDEX, TROPONINI in the last 168 hours.  BNP (last 3 results) No results for input(s): PROBNP in the last 8760 hours.  HbA1C: No results for input(s): HGBA1C in the last 72 hours.  CBG: Recent Labs  Lab 10/30/17 1141 10/30/17 1713 10/30/17 2301 10/31/17 0723 10/31/17 1143  GLUCAP 149* 156* 156* 179* 125*    Lipid Profile: No results for input(s): CHOL, HDL, LDLCALC, TRIG, CHOLHDL, LDLDIRECT in the last 72 hours.  Thyroid Function Tests: No results for input(s): TSH, T4TOTAL, FREET4, T3FREE, THYROIDAB in the last 72 hours.  Anemia Panel: No results for input(s): VITAMINB12, FOLATE, FERRITIN, TIBC, IRON, RETICCTPCT in the last 72 hours.  Urine analysis:    Component Value Date/Time   COLORURINE AMBER (A) 11/11/2017 2311   APPEARANCEUR CLEAR 10/24/2017 2311   LABSPEC 1.026 10/21/2017 2311   PHURINE 5.0 10/23/2017 2311   GLUCOSEU NEGATIVE 11/09/2017 2311   HGBUR NEGATIVE 11/16/2017 2311   BILIRUBINUR NEGATIVE 11/06/2017 2311   KETONESUR NEGATIVE 11/07/2017 2311   PROTEINUR 30 (A) 11/06/2017 2311   UROBILINOGEN 1.0 01/25/2014 1700   NITRITE  NEGATIVE 11/08/2017 2311   LEUKOCYTESUR NEGATIVE 10/18/2017 2311    Sepsis Labs: Lactic Acid, Venous    Component Value Date/Time   LATICACIDVEN 2.0 04/20/2010 1910    MICROBIOLOGY: Recent Results (from the past 240 hour(s))  Urine culture     Status: Abnormal   Collection Time: 11/14/2017 11:19 PM  Result Value Ref Range Status   Specimen Description   Final    URINE, CATHETERIZED Performed at [facility], [place] [address], [place], [place] [address]    Special Requests   Final    NONE Performed at [facility], [place] [address] [place], [place] [address]    Culture (A)  Final    <10,000 COLONIES/mL INSIGNIFICANT GROWTH Performed at [place] [address], [place], [place] [address]    Report Status 10/28/2017 FINAL  Final  MRSA PCR Screening     Status: None   Collection Time: 10/27/17  9:21 AM  Result Value Ref Range Status   MRSA by PCR NEGATIVE NEGATIVE Final    Comment:        The GeneXpert MRSA Assay (FDA approved for NASAL specimens only), is one component of a comprehensive MRSA colonization surveillance program. It is not intended to diagnose MRSA infection nor to guide or monitor treatment for MRSA infections. Performed at [facility], [place] [address] [place], [place] [address]   Culture, sputum-assessment     Status: None   Collection Time: 10/27/17  3:30 PM  Result Value Ref Range Status   Specimen Description EXPECTORATED SPUTUM  Final   Special Requests NONE  Final   Sputum evaluation   Final    THIS SPECIMEN IS ACCEPTABLE FOR SPUTUM CULTURE Performed at [facility], [place] [address], [place], [place] [address]    Report Status 10/27/2017 FINAL  Final  Culture, respiratory (NON-Expectorated)     Status: None   Collection Time: 10/27/17  3:30 PM  Result Value Ref Range Status   Specimen Description   Final    EXPECTORATED SPUTUM Performed at [facility], [place] [address]  [address] [place], [place] [address]    Special Requests   Final    NONE Reflexed from [id number] Performed at [facility], [place] [address], [place], [place] [address]    Gram Stain   Final    FEW WBC PRESENT,BOTH PMN AND MONONUCLEAR MODERATE GRAM POSITIVE COCCI    Culture   Final    Consistent with normal respiratory flora. Performed at [facility], [place] [address] [place], [place] [address]    Report Status 10/30/2017 FINAL  Final  Body fluid culture     Status: None (Preliminary result)   Collection Time: 10/29/17 10:26 AM  Result Value Ref Range Status   Specimen Description   Final    PLEURAL RIGHT Performed at [place] [address] [place], [place] [address]    Special Requests   Final    NONE Performed at [facility], [place] [address] [place] [address], [place], [place] [address]    Gram Stain   Final    FEW WBC PRESENT, PREDOMINANTLY MONONUCLEAR NO ORGANISMS SEEN    Culture   Final    NO GROWTH 2 DAYS Performed at [place] [address] [place], [place] [address]    Report Status PENDING  Incomplete    RADIOLOGY STUDIES/RESULTS: Ct Abdomen Pelvis Wo Contrast  Result Date: 11/13/2017 CLINICAL DATA:  Left shoulder and forehead pain after fall this morning. Lung mass. EXAM: CT CHEST, ABDOMEN, AND PELVIS WITHOUT CONTRAST TECHNIQUE: Multidetector CT imaging of the chest, abdomen and pelvis was performed without IV contrast. CONTRAST:  The patient's IV infiltrated during imaging and the order was changed to CT of the chest, abdomen and pelvis without per ordering clinician. COMPARISON:  PET-CT 05/31/2017 FINDINGS: CT CHEST FINDINGS Cardiovascular: Top-normal size heart with left main and three-vessel coronary arteriosclerosis. Aortic atherosclerosis without aneurysm. The unenhanced pulmonary vasculature is difficult to assess beyond the main pulmonary artery due to soft tissue masses and  adenopathy about the right hilum and mediastinum. Mediastinum/Nodes: Interval increase in masslike abnormalities in the mediastinum and right hilum compatible with progression of metastatic adenopathy and/or increase and extension of known right upper lobe mass. Largest lymph node identified is subcarinal at 2.8 cm. Interval increase in right upper paratracheal lymph node measuring 1.1 cm. Exact margins of additional lymph node masses are difficult to delineate on this unenhanced study. There is luminal narrowing of the distal right mainstem bronchus without occlusion secondary to the right hilar, mediastinal and subcarinal masslike abnormalities. The thyroid gland is unremarkable. The esophagus is not well visualized. Lungs/Pleura: Partially loculated small to moderate right effusion. New pulmonary nodular masslike opacities are noted within the right upper, middle and lower lobes with interval progression in size of spiculated right upper lobe mass now measuring approximately 5.8 x 5.3 x 5 cm. Within the superior segment of the aerated right lower lobe are metastatic nodules measuring up to 2.2 cm. Additional nodules are seen in the right middle and anterior right lower lobe. Left lung remains clear. Nodular thickening along the left major fissure is seen which may represent fluid in the fissure versus an intra fissural mass. This measures 2.2 x 1.2 x 1.7 cm. Peribronchial thickening is noted within both lower lobes. Musculoskeletal: Remote right posterior seventh through ninth rib fractures. No suspicious osseous lesions. Extravasated contrast noted in the soft tissues of the right arm. CT ABDOMEN PELVIS FINDINGS Hepatobiliary: No biliary dilatation. The unenhanced liver is unremarkable. The gallbladder is physiologically distended. Pancreas: No  ductal dilatation or inflammation. Spleen: No splenomegaly or mass. Adrenals/Urinary Tract: No adrenal mass. Interpolar 2.5 cm simple right renal cyst. Unremarkable  noncontrast appearance of the left kidney. No obstructive uropathy. Marked bladder distention to the umbilicus with the bladder measuring 14.7 x 14.1 x 18.8 cm (volume = 2040 cm^3). Stomach/Bowel: Stomach is within normal limits. Appendix appears normal. No evidence of bowel wall thickening, distention, or inflammatory changes. Vascular/Lymphatic: Moderate aortoiliac and branch vessel atherosclerosis. No aneurysm. No adenopathy. Reproductive: Normal size prostate. Other: No free air nor free fluid. Musculoskeletal: Degenerative disc disease L5-S1. No aggressive osseous lesions. IMPRESSION: Extravasation of IV contrast into the patient's right arm. Study was flagged for contrast extravasation radiology PA follow-up should the patient be discharged. Chest CT: 1. Interval increase in spiculated right upper lobe mass now measuring approximately 5.8 x 5.3 x 5 cm with progression of metastatic mediastinal and hilar adenopathy as well as development of satellite pulmonary nodules in the right lung. Slight luminal narrowing of the distal right mainstem bronchus due to adenopathy. No occlusion noted however. 2. Ovoid density in the left major fissure may represent a pseudo lesion with fluid in the fissure versus an intra fissural mass measuring 2.2 x 1.2 x 1.7 cm. 3. Loculated moderate right pleural effusion. Abdomen and pelvic CT: 1. Marked urinary distention with volume of approximately 2 L. 2. Simple right interpolar renal cyst measuring 2.5 cm. 3. No apparent evidence of metastatic disease within the abdomen and pelvis given limitations of a noncontrast exam. Electronically Signed   By: Ashley Royalty M.D.   On: 11/07/2017 22:55   Dg Chest 2 View  Result Date: 10/30/2017 CLINICAL DATA:  Cough, history of lung cancer. EXAM: CHEST - 2 VIEW COMPARISON:  05/18/2017 chest CT, CXR 05/18/2017 FINDINGS: Interval increase in size of masslike opacity in the right upper lobe, best appreciated on the sagittal view measuring  approximately 4 x 3.5 x 4.8 cm with right paratracheal and perihilar soft tissue prominence concerning for progression of lymphadenopathy. Superimposed adjacent pneumonia is difficult to entirely exclude. Hyperinflated left lung without pulmonary consolidation or dominant mass. Heart size is top-normal. No aortic aneurysm. No suspicious osseous lesions. IMPRESSION: 1. Interval increase in size of right upper lobe posterior masslike abnormality now estimated at 4 x 3.5 x 4.8 cm, previously estimated at approximately 3.6 cm. 2. Interval increase in soft tissue prominence about the mediastinum and right hilum compatible with metastatic lymphadenopathy. Superimposed adjacent airspace opacities cannot exclude pneumonia and/or areas of atelectasis. Electronically Signed   By: Ashley Royalty M.D.   On: 10/27/2017 19:37   Dg Elbow Complete Left (3+view)  Result Date: 10/29/2017 CLINICAL DATA:  Elbow fracture. EXAM: LEFT ELBOW - COMPLETE 3+ VIEW; LEFT FOREARM - 2 VIEW COMPARISON:  Left elbow and forearm x-rays dated September 20, 2017. FINDINGS: Left elbow: Unchanged mild cortical step-off along the medial aspect of the capitellum. No new fracture. Persistent elbow joint effusion. Osteopenia. Soft tissues are unremarkable. Left forearm: No radius or ulna fracture. No focal bone lesion. Osteopenia. Soft tissues are unremarkable. IMPRESSION: 1. Unchanged minimally displaced fracture of the capitellum. Persistent elbow joint effusion. 2. No acute abnormality of the forearm. Electronically Signed   By: Titus Dubin M.D.   On: 10/29/2017 13:17   Dg Forearm Left  Result Date: 10/29/2017 CLINICAL DATA:  Elbow fracture. EXAM: LEFT ELBOW - COMPLETE 3+ VIEW; LEFT FOREARM - 2 VIEW COMPARISON:  Left elbow and forearm x-rays dated September 20, 2017. FINDINGS: Left elbow: Unchanged mild cortical  step-off along the medial aspect of the capitellum. No new fracture. Persistent elbow joint effusion. Osteopenia. Soft tissues are unremarkable.  Left forearm: No radius or ulna fracture. No focal bone lesion. Osteopenia. Soft tissues are unremarkable. IMPRESSION: 1. Unchanged minimally displaced fracture of the capitellum. Persistent elbow joint effusion. 2. No acute abnormality of the forearm. Electronically Signed   By: Titus Dubin M.D.   On: 10/29/2017 13:17   Ct Head Wo Contrast  Result Date: 11/07/2017 CLINICAL DATA:  [place] [address] out of wheelchair today. Left forehead and left shoulder pain. Initial encounter. EXAM: CT HEAD WITHOUT CONTRAST CT CERVICAL SPINE WITHOUT CONTRAST TECHNIQUE: Multidetector CT imaging of the head and cervical spine was performed following the standard protocol without intravenous contrast. Multiplanar CT image reconstructions of the cervical spine were also generated. COMPARISON:  09/21/2017 FINDINGS: CT HEAD FINDINGS Brain: Right MCA territory encephalomalacia is unchanged as is more extensive right temporal lobe encephalomalacia which may be in part postsurgical. There is marked cerebral scratched of there is marked cerebellar atrophy. Periventricular white matter hypodensities are unchanged and nonspecific but compatible with mild chronic small vessel ischemic disease. No acute large territory infarct, intracranial hemorrhage, mass, midline shift, or extra-axial fluid collection is identified. Vascular: Calcified atherosclerosis at the skull base. No hyperdense vessel. Skull: Prior right pterional craniotomy.  No acute fracture. Sinuses/Orbits: The visualized paranasal sinuses and mastoid air cells are clear. The orbits are unremarkable. Other: None. CT CERVICAL SPINE FINDINGS Alignment: Chronic reversal of the normal cervical lordosis. Unchanged grade 1 anterolisthesis of C2 on C3 and C3 on C4. Skull base and vertebrae: No acute fracture or suspicious osseous lesion. Soft tissues and spinal canal: No prevertebral fluid or swelling. No visible canal hematoma. Disc levels: Moderate diffuse cervical disc degeneration and  moderate to severe left greater than right mid upper cervical facet arthrosis resulting in advanced multilevel neural foraminal stenosis. Upper chest: Right pleural effusion. Unchanged 3 mm left apical lung nodule. Other: Moderate calcified atherosclerosis at the carotid bifurcations. IMPRESSION: 1. No evidence of acute intracranial abnormality. 2. Right cerebral hemisphere encephalomalacia. Severe cerebellar atrophy. 3. No acute cervical spine fracture. Electronically Signed   By: Logan Bores M.D.   On: 10/24/2017 19:51   Ct Chest Wo Contrast  Result Date: 11/06/2017 CLINICAL DATA:  Left shoulder and forehead pain after fall this morning. Lung mass. EXAM: CT CHEST, ABDOMEN, AND PELVIS WITHOUT CONTRAST TECHNIQUE: Multidetector CT imaging of the chest, abdomen and pelvis was performed without IV contrast. CONTRAST:  The patient's IV infiltrated during imaging and the order was changed to CT of the chest, abdomen and pelvis without per ordering clinician. COMPARISON:  PET-CT 05/31/2017 FINDINGS: CT CHEST FINDINGS Cardiovascular: Top-normal size heart with left main and three-vessel coronary arteriosclerosis. Aortic atherosclerosis without aneurysm. The unenhanced pulmonary vasculature is difficult to assess beyond the main pulmonary artery due to soft tissue masses and adenopathy about the right hilum and mediastinum. Mediastinum/Nodes: Interval increase in masslike abnormalities in the mediastinum and right hilum compatible with progression of metastatic adenopathy and/or increase and extension of known right upper lobe mass. Largest lymph node identified is subcarinal at 2.8 cm. Interval increase in right upper paratracheal lymph node measuring 1.1 cm. Exact margins of additional lymph node masses are difficult to delineate on this unenhanced study. There is luminal narrowing of the distal right mainstem bronchus without occlusion secondary to the right hilar, mediastinal and subcarinal masslike abnormalities.  The thyroid gland is unremarkable. The esophagus is not well visualized. Lungs/Pleura: Partially loculated small  to moderate right effusion. New pulmonary nodular masslike opacities are noted within the right upper, middle and lower lobes with interval progression in size of spiculated right upper lobe mass now measuring approximately 5.8 x 5.3 x 5 cm. Within the superior segment of the aerated right lower lobe are metastatic nodules measuring up to 2.2 cm. Additional nodules are seen in the right middle and anterior right lower lobe. Left lung remains clear. Nodular thickening along the left major fissure is seen which may represent fluid in the fissure versus an intra fissural mass. This measures 2.2 x 1.2 x 1.7 cm. Peribronchial thickening is noted within both lower lobes. Musculoskeletal: Remote right posterior seventh through ninth rib fractures. No suspicious osseous lesions. Extravasated contrast noted in the soft tissues of the right arm. CT ABDOMEN PELVIS FINDINGS Hepatobiliary: No biliary dilatation. The unenhanced liver is unremarkable. The gallbladder is physiologically distended. Pancreas: No ductal dilatation or inflammation. Spleen: No splenomegaly or mass. Adrenals/Urinary Tract: No adrenal mass. Interpolar 2.5 cm simple right renal cyst. Unremarkable noncontrast appearance of the left kidney. No obstructive uropathy. Marked bladder distention to the umbilicus with the bladder measuring 14.7 x 14.1 x 18.8 cm (volume = 2040 cm^3). Stomach/Bowel: Stomach is within normal limits. Appendix appears normal. No evidence of bowel wall thickening, distention, or inflammatory changes. Vascular/Lymphatic: Moderate aortoiliac and branch vessel atherosclerosis. No aneurysm. No adenopathy. Reproductive: Normal size prostate. Other: No free air nor free fluid. Musculoskeletal: Degenerative disc disease L5-S1. No aggressive osseous lesions. IMPRESSION: Extravasation of IV contrast into the patient's right arm. Study  was flagged for contrast extravasation radiology PA follow-up should the patient be discharged. Chest CT: 1. Interval increase in spiculated right upper lobe mass now measuring approximately 5.8 x 5.3 x 5 cm with progression of metastatic mediastinal and hilar adenopathy as well as development of satellite pulmonary nodules in the right lung. Slight luminal narrowing of the distal right mainstem bronchus due to adenopathy. No occlusion noted however. 2. Ovoid density in the left major fissure may represent a pseudo lesion with fluid in the fissure versus an intra fissural mass measuring 2.2 x 1.2 x 1.7 cm. 3. Loculated moderate right pleural effusion. Abdomen and pelvic CT: 1. Marked urinary distention with volume of approximately 2 L. 2. Simple right interpolar renal cyst measuring 2.5 cm. 3. No apparent evidence of metastatic disease within the abdomen and pelvis given limitations of a noncontrast exam. Electronically Signed   By: Ashley Royalty M.D.   On: 11/08/2017 22:55   Ct Cervical Spine Wo Contrast  Result Date: 10/19/2017 CLINICAL DATA:  [place] out of wheelchair today. Left forehead and left shoulder pain. Initial encounter. EXAM: CT HEAD WITHOUT CONTRAST CT CERVICAL SPINE WITHOUT CONTRAST TECHNIQUE: Multidetector CT imaging of the head and cervical spine was performed following the standard protocol without intravenous contrast. Multiplanar CT image reconstructions of the cervical spine were also generated. COMPARISON:  09/21/2017 FINDINGS: CT HEAD FINDINGS Brain: Right MCA territory encephalomalacia is unchanged as is more extensive right temporal lobe encephalomalacia which may be in part postsurgical. There is marked cerebral scratched of there is marked cerebellar atrophy. Periventricular white matter hypodensities are unchanged and nonspecific but compatible with mild chronic small vessel ischemic disease. No acute large territory infarct, intracranial hemorrhage, mass, midline shift, or extra-axial  fluid collection is identified. Vascular: Calcified atherosclerosis at the skull base. No hyperdense vessel. Skull: Prior right pterional craniotomy.  No acute fracture. Sinuses/Orbits: The visualized paranasal sinuses and mastoid air cells are clear. The orbits  are unremarkable. Other: None. CT CERVICAL SPINE FINDINGS Alignment: Chronic reversal of the normal cervical lordosis. Unchanged grade 1 anterolisthesis of C2 on C3 and C3 on C4. Skull base and vertebrae: No acute fracture or suspicious osseous lesion. Soft tissues and spinal canal: No prevertebral fluid or swelling. No visible canal hematoma. Disc levels: Moderate diffuse cervical disc degeneration and moderate to severe left greater than right mid upper cervical facet arthrosis resulting in advanced multilevel neural foraminal stenosis. Upper chest: Right pleural effusion. Unchanged 3 mm left apical lung nodule. Other: Moderate calcified atherosclerosis at the carotid bifurcations. IMPRESSION: 1. No evidence of acute intracranial abnormality. 2. Right cerebral hemisphere encephalomalacia. Severe cerebellar atrophy. 3. No acute cervical spine fracture. Electronically Signed   By: Logan Bores M.D.   On: 11/12/2017 19:51   Dg Chest Port 1 View  Result Date: 10/29/2017 CLINICAL DATA:  Status post thoracentesis. EXAM: PORTABLE CHEST 1 VIEW COMPARISON:  Chest radiograph 11/12/2017 FINDINGS: Negative for pneumothorax following thoracentesis. There may be a small amount of right pleural fluid present. Extensive airspace densities throughout the right lung particularly in the perihilar regions. The degree of right lung consolidation may have slightly increased. Left lung remains clear. Heart size is grossly stable. Old right rib fractures. IMPRESSION: Negative for pneumothorax following thoracentesis. Extensive airspace disease and consolidation in the right lung and cannot exclude progression since prior examination. Electronically Signed   By: Markus Daft  M.D.   On: 10/29/2017 10:23   Dg Shoulder Left  Result Date: 10/22/2017 CLINICAL DATA:  [place] striking the left side today. Recent distal humeral fracture. EXAM: LEFT SHOULDER - 2+ VIEW COMPARISON:  Left humerus series of Nov 17, 2009 FINDINGS: There is chronic deformity of the distal clavicle. No acute clavicular fracture is observed. The scapula and proximal humerus appear normal. There is narrowing of the glenohumeral joint space. IMPRESSION: There is no acute fracture or dislocation of the left shoulder. 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 There is chronic deformity of the distal clavicle. Electronically Signed   By: David  Martinique M.D.   On: 10/25/2017 11:20     LOS: 4 days   Signature  Lala Lund M.D on 10/31/2017 at 2:28 PM  Between 7am to 7pm - Pager - [phone] ( page via [email or web address], text pages only, please mention full 10 digit call back number).  After 7pm go to [email or web address] - password [email or web address]

## 2017-11-01 ENCOUNTER — Inpatient Hospital Stay (HOSPITAL_COMMUNITY): Payer: Medicare HMO

## 2017-11-01 LAB — BASIC METABOLIC PANEL
Anion gap: 10 (ref 5–15)
BUN: 9 mg/dL (ref 6–20)
CALCIUM: 9.1 mg/dL (ref 8.9–10.3)
CHLORIDE: 100 mmol/L — AB (ref 101–111)
CO2: 23 mmol/L (ref 22–32)
CREATININE: 0.53 mg/dL — AB (ref 0.61–1.24)
GFR calc Af Amer: >60 mL/min (ref >60)
GFR calc non Af Amer: >60 mL/min (ref >60)
GLUCOSE: 170 mg/dL — AB (ref 65–99)
Potassium: 4.5 mmol/L (ref 3.5–5.1)
Sodium: 133 mmol/L — ABNORMAL LOW (ref 135–145)

## 2017-11-01 LAB — BODY FLUID CULTURE: CULTURE: NO GROWTH

## 2017-11-01 LAB — CBC
HEMATOCRIT: 33.5 % — AB (ref 39.0–52.0)
HEMOGLOBIN: 10.6 g/dL — AB (ref 13.0–17.0)
MCH: 25.9 pg — ABNORMAL LOW (ref 26.0–34.0)
MCHC: 31.6 g/dL (ref 30.0–36.0)
MCV: 81.9 fL (ref 78.0–100.0)
Platelets: 384 10*3/uL (ref 150–400)
RBC: 4.09 MIL/uL — ABNORMAL LOW (ref 4.22–5.81)
RDW: 15.2 % (ref 11.5–15.5)
WBC: 10.4 10*3/uL (ref 4.0–10.5)

## 2017-11-01 LAB — GLUCOSE, CAPILLARY
GLUCOSE-CAPILLARY: 165 mg/dL — AB (ref 65–99)
GLUCOSE-CAPILLARY: 141 mg/dL — AB (ref 65–99)
Glucose-Capillary: 139 mg/dL — ABNORMAL HIGH (ref 65–99)
Glucose-Capillary: 161 mg/dL — ABNORMAL HIGH (ref 65–99)

## 2017-11-01 LAB — MAGNESIUM: Magnesium: 1.7 mg/dL (ref 1.7–2.4)

## 2017-11-01 MED ORDER — DIGOXIN 0.25 MG/ML IJ SOLN
0.2500 mg | Freq: Four times a day (QID) | INTRAMUSCULAR | Status: AC
  Administered 2017-11-01 (×2): 0.25 mg via INTRAVENOUS
  Filled 2017-11-01 (×2): qty 1

## 2017-11-01 MED ORDER — DILTIAZEM HCL 25 MG/5ML IV SOLN
10.0000 mg | Freq: Four times a day (QID) | INTRAVENOUS | Status: DC | PRN
  Administered 2017-11-01 – 2017-11-18 (×5): 10 mg via INTRAVENOUS
  Filled 2017-11-01 (×7): qty 5

## 2017-11-01 NOTE — Plan of Care (Signed)

## 2017-11-01 NOTE — Progress Notes (Signed)
Cytology reviewed, atypical cells noted.  Plan for FOB 4/16 at 7:30 am per Dr. Lamonte Sakai.     Noe Gens, NP-C [facility] Pgr: [phone] or if no answer [phone] 11/01/2017, 2:21 PM

## 2017-11-01 NOTE — Care Management Important Message (Signed)
Important Message  Patient Details  Name: Wyatt Salas MRN: [id number] Date of Birth: October 21, 1950   Medicare Important Message Given:  Yes    Kerin Salen 11/01/2017, 12:50 [place] Message  Patient Details  Name: Wyatt Salas MRN: [id number] Date of Birth: Jul 07, 1951   Medicare Important Message Given:  Yes    Kerin Salen 11/01/2017, 12:50 PM

## 2017-11-01 NOTE — Progress Notes (Signed)
Occupational Therapy Treatment Patient Details Name: Wyatt Salas MRN: [id number] DOB: 04/19/1951 Today's Date: 11/01/2017    History of present illness pt was admitted for A Fib with RVR.  PMH:  TBI, ETOH, DM, crani.  Had L distal elbow fx last month   OT comments  Pt continues to use LUE as gross assist and ROM improving.    Follow Up Recommendations  SNF    Equipment Recommendations  3 in 1 bedside commode    Recommendations for Other Services      Precautions / Restrictions Precautions Precautions: Fall Precaution Comments: Per Dr [name], ROM OK to tolerance, splint just removed; follow up xray pending, WBAT, monitor BP- orthostatic Required Braces or Orthoses: Other Brace/Splint Other Brace/Splint: L wrist splint Restrictions Weight Bearing Restrictions: No       Mobility Bed Mobility Overal bed mobility: Needs Assistance Bed Mobility: Supine to Sit;Sit to Supine     Supine to sit: Min guard;HOB elevated Sit to supine: Min guard;HOB elevated   General bed mobility comments: use of rail  Transfers Overall transfer level: Needs assistance Equipment used: 1 person hand held assist Transfers: Sit to/from Stand Sit to Stand: Min assist         General transfer comment: assist to rise and stabilize.  Held onto bed rail then armrest of chair    Balance                                           ADL either performed or assessed with clinical judgement   ADL Overall ADL's : Needs assistance/impaired Eating/Feeding: Set up;Sitting   Grooming: Minimal assistance;Sitting;Oral care                   Toilet Transfer: Minimal assistance;Stand-pivot;RW(chair)             General ADL Comments: pt opened toothpaste; needed min A to reach for an retrieve items.  He did not want to try to butter roll as he didn't want it to fall     Vision       Perception     Praxis      Cognition Arousal/Alertness: Awake/alert Behavior  During Therapy: WFL for tasks assessed/performed Overall Cognitive Status: No family/caregiver present to determine baseline cognitive functioning                                 General Comments: mostly wfl        Exercises  Other Exercises Other Exercises: worked on ROM throughout LUE:  FF, AAROM x 10 reps to 80; elbow AROM 100 flexion to -30 extension, supination to 45, wrist extension to 10 in gravity eliminated plane; finger flexion/extension--able to make loose fist; thumb abd/add; flexion extension is hardest. Other Exercises: used LUE as functional assist for grooming Other Exercises: Pt is very happy with wrist cock up splint:  checked skin, no issues   Shoulder Instructions       General Comments      Pertinent Vitals/ Pain       Pain Assessment: 0-10 Pain Score: 4  Pain Location: L UE, L ankle/foot Pain Descriptors / Indicators: Aching;Sore;Discomfort Pain Intervention(s): Limited activity within patient's tolerance;Monitored during session;Premedicated before session;Repositioned  Home Living  Prior Functioning/Environment              Frequency  Min 2X/week        Progress Toward Goals  OT Goals(current goals can now be found in the care plan section)  Progress towards OT goals: Progressing toward goals     Plan      Co-evaluation                 AM-PAC PT "6 Clicks" Daily Activity     Outcome Measure   Help from another person eating meals?: A Little Help from another person taking care of personal grooming?: A Little Help from another person toileting, which includes using toliet, bedpan, or urinal?: A Little Help from another person bathing (including washing, rinsing, drying)?: A Little Help from another person to put on and taking off regular upper body clothing?: A Little Help from another person to put on and taking off regular lower body clothing?: A  Little 6 Click Score: 18    End of Session    OT Visit Diagnosis: Muscle weakness (generalized) (M62.81);Pain Pain - Right/Left: Left Pain - part of body: Arm   Activity Tolerance Patient tolerated treatment well   Patient Left in chair;with call bell/phone within reach;with chair alarm set   Nurse Communication          Time: 4401-0272 OT Time Calculation (min): 27 min  Charges: OT General Charges $OT Visit: 1 Visit OT Treatments $Self Care/Home Management : 8-22 mins $Therapeutic Activity: 8-22 mins  Wyatt Salas, OTR/L [phone] 11/01/2017   Wyatt Salas 11/01/2017, 2:33 PM

## 2017-11-01 NOTE — Progress Notes (Signed)
Physical Therapy Treatment Patient Details Name: Wyatt Salas MRN: [id number] DOB: 13-Mar-1951 Today's Date: 11/01/2017    History of Present Illness pt was admitted for A Fib with RVR.  PMH:  TBI, ETOH, DM, crani.  Had L distal elbow fx last month    PT Comments    Pt remains weak and fatigues easily with activity. He was able to take a few steps in the room with a RW on today. Initiated seated ther ex as well on today. Remained on [place] O2-sats 92-99%. Dyspnea 3/4 with activity. Continue to recommend SNF for rehab. Will follow and progress activity as tolerated.     Follow Up Recommendations  SNF     Equipment Recommendations  None recommended by PT    Recommendations for Other Services       Precautions / Restrictions Precautions Precautions: Fall Precaution Comments: Per Dr [name], ROM OK, splint just removed, WBAT, monitor BP- orthostatic Required Braces or Orthoses: Other Brace/Splint Other Brace/Splint: L wrist splint Restrictions Weight Bearing Restrictions: No    Mobility  Bed Mobility Overal bed mobility: Needs Assistance Bed Mobility: Supine to Sit;Sit to Supine     Supine to sit: Min guard;HOB elevated Sit to supine: Min guard;HOB elevated   General bed mobility comments: close guard for safety. Increased time. Rest break needed after movement.   Transfers Overall transfer level: Needs assistance Equipment used: Rolling walker (2 wheeled) Transfers: Sit to/from Stand Sit to Stand: Min assist;From elevated surface         General transfer comment: Assist to rise, stabilize, control descent. VCs safety, hand placement. Unsteady.   Ambulation/Gait Ambulation/Gait assistance: Min assist Ambulation Distance (Feet): 3 Feet Assistive device: Rolling walker (2 wheeled)       General Gait Details: Pt took a few steps in room with a RW. Fatigues very easily with activity. He is weak. Assist needed to stabilize and maneuver with walker. Remained on [place] O2.  Dyspnea 3/4 with activity.    Stairs             Wheelchair Mobility    Modified Rankin (Stroke Patients Only)       Balance                                            Cognition Arousal/Alertness: Awake/alert Behavior During Therapy: WFL for tasks assessed/performed Overall Cognitive Status: No family/caregiver present to determine baseline cognitive functioning                                 General Comments: mostly wfl      Exercises General Exercises - Lower Extremity Ankle Circles/Pumps: AROM;Both;15 reps;Seated Long Arc Quad: AROM;Both;15 reps;Seated Hip Flexion/Marching: AROM;Both;10 reps;Seated    General Comments        Pertinent Vitals/Pain Pain Assessment: 0-10 Pain Score: 6  Pain Location: L UE, L ankle/foot Pain Descriptors / Indicators: Aching;Sore;Discomfort Pain Intervention(s): Limited activity within patient's tolerance;Repositioned    Home Living                      Prior Function            PT Goals (current goals can now be found in the care plan section) Progress towards PT goals: Progressing toward goals    Frequency  Min 2X/week      PT Plan Current plan remains appropriate    Co-evaluation              AM-PAC PT "6 Clicks" Daily Activity  Outcome Measure  Difficulty turning over in bed (including adjusting bedclothes, sheets and blankets)?: A Little Difficulty moving from lying on back to sitting on the side of the bed? : A Little Difficulty sitting down on and standing up from a chair with arms (e.g., wheelchair, bedside commode, etc,.)?: Unable Help needed moving to and from a bed to chair (including a wheelchair)?: A Lot Help needed walking in hospital room?: A Lot Help needed climbing 3-5 steps with a railing? : A Lot 6 Click Score: 13    End of Session Equipment Utilized During Treatment: Gait belt;Oxygen Activity Tolerance: Patient limited by  fatigue Patient left: in bed;with call bell/phone within reach;with bed alarm set   PT Visit Diagnosis: Muscle weakness (generalized) (M62.81);Other abnormalities of gait and mobility (R26.89);Unsteadiness on feet (R26.81);Difficulty in walking, not elsewhere classified (R26.2);History of falling (Z91.81)     Time: 8280-0349 PT Time Calculation (min) (ACUTE ONLY): 18 min  Charges:  $Therapeutic Activity: 8-22 mins                    G Codes:        Weston Anna, MPT Pager: [phone]

## 2017-11-01 NOTE — Progress Notes (Signed)
Daily Progress Note   Patient Name: Wyatt Salas       Date: 11/01/2017 DOB: 07-Mar-1951  Age: 67 y.o. MRN#: [id number] Attending Physician: Thurnell Lose, MD Primary Care Physician: [name], MD Admit Date: 11/12/2017  Reason for Consultation/Follow-up: Establishing goals of care  Subjective: Wyatt Salas denies complaints other than pain at site of arm fracture.  Discussed waiting for results of cytology and possible bronch tomorrow.  Length of Stay: 5  Current Medications: Scheduled Meds:  . aspirin  81 mg Oral Daily  . atorvastatin  40 mg Oral q1800  . docusate sodium  200 mg Oral BID  . enoxaparin (LOVENOX) injection  40 mg Subcutaneous Q24H  . feeding supplement (ENSURE ENLIVE)  237 mL Oral BID BM  . folic acid  1 mg Oral Daily  . insulin aspart  0-5 Units Subcutaneous QHS  . insulin aspart  0-9 Units Subcutaneous TID WC  . lacosamide  100 mg Oral BID  . levETIRAcetam  1,500 mg Oral BID  . metoprolol tartrate  25 mg Oral BID  . multivitamin with minerals  1 tablet Oral Daily  . phenytoin  30 mg Oral BID  . sodium chloride flush  3 mL Intravenous Q12H  . tamsulosin  0.4 mg Oral Daily  . thiamine  100 mg Oral Daily    Continuous Infusions:   PRN Meds: acetaminophen **OR** acetaminophen, diltiazem, guaiFENesin-dextromethorphan, HYDROcodone-acetaminophen, ondansetron **OR** ondansetron (ZOFRAN) IV, phenol, polyethylene glycol, polyvinyl alcohol  Physical Exam         General: Alert, awake, in no acute distress. Cachectic and chronically ill appearing HEENT: No bruits, no goiter, no JVD, poor dentition Heart: Regular rate.. No murmur appreciated. Lungs: Dminished, esp RLL Abdomen: Soft, nontender, nondistended, positive bowel sounds.  Ext: No significant  edema Skin: Warm and dry Neuro: Grossly intact, nonfocal.    Vital Signs: BP 108/74 (BP Location: Right Wrist)   Pulse 93   Temp 98.9 F (37.2 C) (Oral)   Resp 18   Ht 6\' 1"  (1.854 m)   Wt 57.1 kg (125 lb 14.1 oz)   SpO2 99%   BMI 16.61 kg/m  SpO2: SpO2: 99 % O2 Device: O2 Device: Nasal Cannula O2 Flow Rate: O2 Flow Rate (L/min): 2 L/min  Intake/output summary:   Intake/Output Summary (Last 24 hours)  at 11/01/2017 1547 Last data filed at 11/01/2017 1421 Gross per 24 hour  Intake 900 ml  Output 1500 ml  Net -600 ml   LBM: Last BM Date: 10/31/17 Baseline Weight: Weight: 68 kg (150 lb) Most recent weight: Weight: 57.1 kg (125 lb 14.1 oz)       Palliative Assessment/Data:    Flowsheet Rows     Most Recent Value  Intake Tab  Referral Department  Hospitalist  Unit at Time of Referral  Med/Surg Unit  Palliative Care Primary Diagnosis  Cancer  Date Notified  10/28/17  Palliative Care Type  New Palliative care  Reason for referral  Clarify Goals of Care  Date of Admission  11/03/2017  Date first seen by Palliative Care  10/29/17  # of days Palliative referral response time  1 Day(s)  # of days IP prior to Palliative referral  2  Clinical Assessment  Palliative Performance Scale Score  60%  Pain Max last 24 hours  6  Pain Min Last 24 hours  0  Psychosocial & Spiritual Assessment  Palliative Care Outcomes  Patient/Family meeting held?  Yes  Who was at the meeting?  patient  Palliative Care Outcomes  Clarified goals of care      Patient Active Problem List   Diagnosis Date Noted  . Protein-calorie malnutrition, severe 10/28/2017  . Acute urinary retention 10/27/2017  . Atrial fibrillation with RVR ([place]) 10/27/2017  . Chronic diastolic CHF (congestive heart failure) ([place]) 10/27/2017  . Closed fracture of distal end of left humerus 10/27/2017  . Mass of upper lobe of right lung   . New onset atrial fibrillation ([place])   . Abnormal liver function   . Lung mass     . Essential hypertension   . Altered mental status 05/18/2017  . Right sided weakness 10/14/2016  . Acute urinary tract infection 10/14/2016  . Elevated troponin 10/14/2016  . Type 2 diabetes mellitus with hyperlipidemia ([place]) 10/14/2016  . Fall at home--mechanical 01/26/2014  . Elevated CK 01/26/2014  . Seizure disorder ([place]) 07/26/2013  . NSTEMI (non-ST elevated myocardial infarction) ([place]) 07/26/2013  . Hyperlipidemia 07/26/2013  . Aspiration pneumonia ([place]) 07/26/2013  . Rhabdomyolysis 07/26/2013    Palliative Care Assessment & Plan   Patient Profile: 67 y.o. 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 fib with RVR.  Await results of cytology for decision regarding possible need for bronchoscopy.  Recommendations/Plan: -Discussed again if he would desire treatment for malignancy if this is found to be etiology of lung mass.  He has difficult social situation, but reports again today desire to seek treatment.  Patient would like to wait till he gets more information prior to having other long-term goals of care conversations.  ? Bronchoscopy tomorrow. -Patient has not completed advanced directives, but in my conversation with him he noted that he did not think that his family would be the best decision makers on his behalf.  He is not forthcoming with his family situation ("it's complicated"), but he does have a son who would be his surrogate as best I am able to determine based on information he provides.  -I will ask someone from palliative to follow-up once results of biopsy are available.  Goals of Care and Additional Recommendations:  Limitations  on Scope of Treatment: Full Scope Treatment  Code Status:     Code Status Orders  (From admission, onward)        Start     Ordered   10/27/17 0548  Full code  Continuous     10/27/17 0549    Code Status History    Date Active Date Inactive Code Status Order ID Comments User Context   05/18/2017 2330 05/24/2017 2201 Full Code [id number]  Jani Gravel, MD Inpatient   10/14/2016 2118 10/15/2016 1921 Full Code [id number]  Toy Baker, MD Inpatient   01/25/2014 2010 01/26/2014 2035 Full Code [id number]  Corky Sox, MD Inpatient   07/27/2013 0019 07/31/2013 1533 Full Code [id number]  Orvan Falconer, MD Inpatient       Prognosis:   Unable to determine  Discharge Planning:  To Be Determined  Care plan was discussed with Patient  Thank you for allowing the Palliative Medicine Team to assist in the care of this patient.   Total Time 20 Prolonged Time Billed  no       Greater than 50%  of this time was spent counseling and coordinating care related to the above assessment and plan.  Micheline Rough, MD  Please contact Palliative Medicine Team phone at [phone] for questions and concerns.

## 2017-11-01 NOTE — H&P (View-Only) (Signed)
Cytology reviewed, atypical cells noted.  Plan for FOB 4/16 at 7:30 am per Dr. Lamonte Sakai.     Wyatt Gens, NP-C [facility] Pgr: [phone] or if no answer [phone] 11/01/2017, 2:21 PM

## 2017-11-01 NOTE — Progress Notes (Signed)
Pt up to [facility], pt refused to get in the chair wanted to return to bed. Will cont to monitor. SRP, RN

## 2017-11-01 NOTE — Progress Notes (Signed)
   11/01/17 1500  Clinical Encounter Type  Visited With Patient  Visit Type Initial;Spiritual support  Referral From Nurse  Consult/Referral To Chaplain  Spiritual Encounters  Spiritual Needs Literature;Prayer   Responded to a [facility] for prayer and information on HCPA.  What patient stated that he needs is a legal document not HCPA.  Patient indicated he does not have blood relatives in this area and they don't know he is in the hospital.  Patient is hoping his pastor will visit him.  He asked for a Bible and so I provided one for him.  He asked for prayer and we prayed together.  Will follow and support as needed. Chaplain Katherene Ponto

## 2017-11-01 NOTE — Progress Notes (Addendum)
PROGRESS NOTE        PATIENT DETAILS Name: Wyatt Salas Age: 67 y.o. Sex: male Date of Birth: 1950-10-11 Admit Date: 11/10/2017 Admitting Physician Vianne Bulls, MD [id number], [name], MD  Brief Narrative: Patient is a 67 y.o. male with history of alcohol use, seizure disorder, DM-2, hypertension, known lung mass (requested second opinion in May 2018) presented to the ED following a mechanical fall, found to be in A. fib with RVR and admitted to the hospitalist service.  Subjective: Patient in bed, appears comfortable, denies any headache, no fever, no chest pain or pressure, no shortness of breath , no abdominal pain. No focal weakness.  Has some left ankle pain today along with continued left arm pain at the site of fracture.    Assessment/Plan:  Atrial fibrillation with RVR: [place] vas 2 score of 2.  Poor candidate for anticoagulation due to history of alcohol abuse and falls and subdural hematoma in the past, also noncompliant.  Initially required Cardizem infusion now in rate control on metoprolol along with 81 mg of aspirin, on 11/01/2017 developed some RVR, PRN IV Cardizem along with 2 doses of IV digoxin will be given for rate control.  Continue to monitor if rate becomes an issue will add low-dose digoxin to his regimen, recent echocardiogram in March 2018 shows a preserved EF of 55%.   Acute urinary retention: On Flomax, failed Foley removal, Foley placed on 10/30/2017 likely will go to SNF with Foley catheter and outpatient urology follow-up.  Lung mass: Apparently was scheduled for a bronchoscopy late last year-but [facility]-he now reports acceptance and willingness to proceed with workup if offered at this time.  Case was discussed with pulmonology-recommendations are to proceed with a thoracocentesis first before proceeding with a flexible bronchoscopy.  Thoracocentesis was performed on 4/12- await cytology.  Patient has poor  insight, poor record of compliance.  Also appears to be alcoholic.  Palliative care on board as well so further long-term goals of care.  Hypertension: Stable on beta-blocker will monitor.  Seizure disorder: Continue Keppra/Dilantin and Vimpat  Left humerus fracture: Due to mechanical fall that the patient had on early March, he was seen in the emergency room on 3/5 and had a splint placed-and he was supposed to follow-up with outpatient orthopedics. He unfortunately never followed up-Ortho was consulted on 4/11- splint was removed on 4/11-PT OT and also are following.  Wrist splint ordered.  Per Bobette Mo outpatient follow-up with Dr. [name] post discharge in a week.  History of alcohol use: Claims he does not use alcohol any longer-claims that his last use was 1 year back-but he had an admission in this facility and late last year where he had alcohol withdrawal symptoms.  No withdrawal symptoms at this time-continue Ativan per protocol.  Monitor for DTs, continue folic acid and thiamine supplementation.  Counseled to quit.  Deconditioning/debility/frequent falls-await PT evaluation require SNF.  Left ankle pain -   X-ray stable likely arthritis related, supportive care and PT.    DM type II.  On sliding scale continue.  CBG (last 3)  Recent Labs    10/31/17 1629 10/31/17 2112 11/01/17 0735  GLUCAP 180* 156* 139*      Telemetry (independently reviewed): Afib  DVT Prophylaxis: Prophylactic Lovenox  Code Status: Full code   Family Communication: None  at bedside  Disposition Plan: Remain inpatient-likely transfer to SNF next week  Antimicrobial agents: Anti-infectives (From admission, onward)   None      Procedures: None  CONSULTS:   cardiology  Time spent: 25- minutes-Greater than 50% of this time was spent in counseling, explanation of diagnosis, planning of further management, and coordination of care.  MEDICATIONS: Scheduled Meds: . aspirin  81 mg Oral Daily  .  atorvastatin  40 mg Oral q1800  . digoxin  0.25 mg Intravenous Q6H  . docusate sodium  200 mg Oral BID  . enoxaparin (LOVENOX) injection  40 mg Subcutaneous Q24H  . feeding supplement (ENSURE ENLIVE)  237 mL Oral BID BM  . folic acid  1 mg Oral Daily  . insulin aspart  0-5 Units Subcutaneous QHS  . insulin aspart  0-9 Units Subcutaneous TID WC  . lacosamide  100 mg Oral BID  . levETIRAcetam  1,500 mg Oral BID  . metoprolol tartrate  25 mg Oral BID  . multivitamin with minerals  1 tablet Oral Daily  . phenytoin  30 mg Oral BID  . sodium chloride flush  3 mL Intravenous Q12H  . tamsulosin  0.4 mg Oral Daily  . thiamine  100 mg Oral Daily   Continuous Infusions:  PRN Meds:.acetaminophen **OR** acetaminophen, diltiazem, guaiFENesin-dextromethorphan, HYDROcodone-acetaminophen, ondansetron **OR** ondansetron (ZOFRAN) IV, phenol, polyethylene glycol, polyvinyl alcohol   PHYSICAL EXAM: Vital signs: Vitals:   10/31/17 2111 11/01/17 0608 11/01/17 0930 11/01/17 0950  BP: 116/65 (!) 92/58 106/60 98/76  Pulse: 73 70    Resp: 18 18    Temp: 99.6 F (37.6 C) 99.3 F (37.4 C)    TempSrc: Oral Oral    SpO2: 96% 95%    Weight:  57.1 kg (125 lb 14.1 oz)    Height:       Filed Weights   10/28/17 0500 10/31/17 0357 11/01/17 0608  Weight: 57.4 kg (126 lb 8.7 oz) 57.6 kg (126 lb 15.8 oz) 57.1 kg (125 lb 14.1 oz)   Body mass index is 16.61 kg/m.   Exam  Awake Alert, Oriented X 3, No new F.N deficits, Normal affect [place].AT,PERRAL Supple Neck,No JVD, No cervical lymphadenopathy appriciated.  Symmetrical Chest wall movement, Good air movement bilaterally, CTAB iRRR,No Gallops, Rubs or new Murmurs, No Parasternal Heave +ve B.Sounds, Abd Soft, No tenderness, No organomegaly appriciated, No rebound - guarding or rigidity. No Cyanosis, Clubbing or edema, No new Rash or bruise  I have personally reviewed following labs and imaging studies  LABORATORY DATA: CBC: Recent Labs  Lab  10/21/2017 2052 10/28/17 0319 10/29/17 0426 10/31/17 0422 11/01/17 0506  WBC 11.1* 9.5 9.1 9.7 10.4  NEUTROABS 8.4*  --   --   --   --   HGB 12.0* 11.1* 10.8* 11.3* 10.6*  HCT 38.0* 36.2* 34.8* 36.2* 33.5*  MCV 83.9 83.4 82.3 82.3 81.9  PLT 391 370 346 328 735    Basic Metabolic Panel: Recent Labs  Lab 10/28/17 0001 10/28/17 0319 10/29/17 0426 10/31/17 0422 11/01/17 0506  NA 138 133* 135 132* 133*  K 3.5 2.9* 3.8 4.6 4.5  CL 101 98* 101 98* 100*  CO2 _0 GLUCOSE 175* 258* 138* 162* 170*  BUN _1 CREATININE 0.72 0.62 0.54* 0.56* 0.53*  CALCIUM 9.5 8.8* 9.0 9.0 9.1  MG 1.9 1.7 1.9 1.7 1.7    GFR: Estimated Creatinine Clearance: 71.4 mL/min (A) (by C-G formula based on SCr of  0.53 mg/dL (L)).  Liver Function Tests: Recent Labs  Lab 11/08/2017 2052 10/28/17 0319 10/29/17 1046 10/31/17 0422  AST 24 40  --  38  ALT 18 28  --  33  ALKPHOS 81 69  --  75  BILITOT 0.6 0.5  --  0.2*  PROT 7.8 6.1* 5.9* 6.0*  ALBUMIN 3.3* 2.5*  --  2.3*   Recent Labs  Lab 10/30/2017 2052  LIPASE 26   No results for input(s): AMMONIA in the last 168 hours.  Coagulation Profile: Recent Labs  Lab 10/27/17 1043  INR 1.11    Cardiac Enzymes: No results for input(s): CKTOTAL, CKMB, CKMBINDEX, TROPONINI in the last 168 hours.  BNP (last 3 results) No results for input(s): PROBNP in the last 8760 hours.  HbA1C: No results for input(s): HGBA1C in the last 72 hours.  CBG: Recent Labs  Lab 10/31/17 0723 10/31/17 1143 10/31/17 1629 10/31/17 2112 11/01/17 0735  GLUCAP 179* 125* 180* 156* 139*    Lipid Profile: No results for input(s): CHOL, HDL, LDLCALC, TRIG, CHOLHDL, LDLDIRECT in the last 72 hours.  Thyroid Function Tests: No results for input(s): TSH, T4TOTAL, FREET4, T3FREE, THYROIDAB in the last 72 hours.  Anemia Panel: No results for input(s): VITAMINB12, FOLATE, FERRITIN, TIBC, IRON, RETICCTPCT in the last 72 hours.  Urine analysis:     Component Value Date/Time   COLORURINE AMBER (A) 10/27/2017 2311   APPEARANCEUR CLEAR 11/04/2017 2311   LABSPEC 1.026 10/19/2017 2311   PHURINE 5.0 11/11/2017 2311   GLUCOSEU NEGATIVE 10/21/2017 2311   HGBUR NEGATIVE 11/04/2017 2311   BILIRUBINUR NEGATIVE 11/06/2017 2311   KETONESUR NEGATIVE 10/24/2017 2311   PROTEINUR 30 (A) 10/18/2017 2311   UROBILINOGEN 1.0 01/25/2014 1700   NITRITE NEGATIVE 11/04/2017 2311   LEUKOCYTESUR NEGATIVE 10/30/2017 2311    Sepsis Labs: Lactic Acid, Venous    Component Value Date/Time   LATICACIDVEN 2.0 04/20/2010 1910    MICROBIOLOGY: Recent Results (from the past 240 hour(s))  Urine culture     Status: Abnormal   Collection Time: 10/31/2017 11:19 PM  Result Value Ref Range Status   Specimen Description   Final    URINE, CATHETERIZED Performed at [facility], [place] [address], [place], [place] [address]    Special Requests   Final    NONE Performed at [facility], [place] [address] [place], [place] [address]    Culture (A)  Final    <10,000 COLONIES/mL INSIGNIFICANT GROWTH Performed at [place] [address], [place], [place] [address]    Report Status 10/28/2017 FINAL  Final  MRSA PCR Screening     Status: None   Collection Time: 10/27/17  9:21 AM  Result Value Ref Range Status   MRSA by PCR NEGATIVE NEGATIVE Final    Comment:        The GeneXpert MRSA Assay (FDA approved for NASAL specimens only), is one component of a comprehensive MRSA colonization surveillance program. It is not intended to diagnose MRSA infection nor to guide or monitor treatment for MRSA infections. Performed at [facility], [place] [address], [place], [place] [address]   Culture, sputum-assessment     Status: None   Collection Time: 10/27/17  3:30 PM  Result Value Ref Range Status   Specimen Description EXPECTORATED SPUTUM  Final   Special Requests NONE  Final   Sputum evaluation   Final     THIS SPECIMEN IS ACCEPTABLE FOR SPUTUM CULTURE Performed at [facility]  [place] [address] [place], [place] [address]    Report Status 10/27/2017 FINAL  Final  Culture, respiratory (NON-Expectorated)     Status: None   Collection Time: 10/27/17  3:30 PM  Result Value Ref Range Status   Specimen Description   Final    EXPECTORATED SPUTUM Performed at [facility], [place] [address] [place], [place] [address]    Special Requests   Final    NONE Reflexed from [id number] Performed at [facility], [place] [address], [place], [place] [address]    Gram Stain   Final    FEW WBC PRESENT,BOTH PMN AND MONONUCLEAR MODERATE GRAM POSITIVE COCCI    Culture   Final    Consistent with normal respiratory flora. Performed at [facility], [place] [address], [place], [place] [address]    Report Status 10/30/2017 FINAL  Final  Body fluid culture     Status: None (Preliminary result)   Collection Time: 10/29/17 10:26 AM  Result Value Ref Range Status   Specimen Description   Final    PLEURAL RIGHT Performed at [place] [address] [place], [place] [address]    Special Requests   Final    NONE Performed at [facility], [place] [address] [place], [place] [address]    Gram Stain   Final    FEW WBC PRESENT, PREDOMINANTLY MONONUCLEAR NO ORGANISMS SEEN    Culture   Final    NO GROWTH 2 DAYS Performed at [place] [address] [place], [place] [address]    Report Status PENDING  Incomplete    RADIOLOGY STUDIES/RESULTS: Ct Abdomen Pelvis Wo Contrast  Result Date: 11/12/2017 CLINICAL DATA:  Left shoulder and forehead pain after fall this morning. Lung mass. EXAM: CT CHEST, ABDOMEN, AND PELVIS WITHOUT CONTRAST TECHNIQUE: Multidetector CT imaging of the chest, abdomen and pelvis was performed without IV contrast. CONTRAST:  The patient's IV infiltrated during imaging and the order was  changed to CT of the chest, abdomen and pelvis without per ordering clinician. COMPARISON:  PET-CT 05/31/2017 FINDINGS: CT CHEST FINDINGS Cardiovascular: Top-normal size heart with left main and three-vessel coronary arteriosclerosis. Aortic atherosclerosis without aneurysm. The unenhanced pulmonary vasculature is difficult to assess beyond the main pulmonary artery due to soft tissue masses and adenopathy about the right hilum and mediastinum. Mediastinum/Nodes: Interval increase in masslike abnormalities in the mediastinum and right hilum compatible with progression of metastatic adenopathy and/or increase and extension of known right upper lobe mass. Largest lymph node identified is subcarinal at 2.8 cm. Interval increase in right upper paratracheal lymph node measuring 1.1 cm. Exact margins of additional lymph node masses are difficult to delineate on this unenhanced study. There is luminal narrowing of the distal right mainstem bronchus without occlusion secondary to the right hilar, mediastinal and subcarinal masslike abnormalities. The thyroid gland is unremarkable. The esophagus is not well visualized. Lungs/Pleura: Partially loculated small to moderate right effusion. New pulmonary nodular masslike opacities are noted within the right upper, middle and lower lobes with interval progression in size of spiculated right upper lobe mass now measuring approximately 5.8 x 5.3 x 5 cm. Within the superior segment of the aerated right lower lobe are metastatic nodules measuring up to 2.2 cm. Additional nodules are seen in the right middle and anterior right lower lobe. Left lung remains clear. Nodular thickening along the left major fissure is seen which may represent fluid in the fissure versus  an intra fissural mass. This measures 2.2 x 1.2 x 1.7 cm. Peribronchial thickening is noted within both lower lobes. Musculoskeletal: Remote right posterior seventh through ninth rib fractures. No suspicious osseous lesions.  Extravasated contrast noted in the soft tissues of the right arm. CT ABDOMEN PELVIS FINDINGS Hepatobiliary: No biliary dilatation. The unenhanced liver is unremarkable. The gallbladder is physiologically distended. Pancreas: No ductal dilatation or inflammation. Spleen: No splenomegaly or mass. Adrenals/Urinary Tract: No adrenal mass. Interpolar 2.5 cm simple right renal cyst. Unremarkable noncontrast appearance of the left kidney. No obstructive uropathy. Marked bladder distention to the umbilicus with the bladder measuring 14.7 x 14.1 x 18.8 cm (volume = 2040 cm^3). Stomach/Bowel: Stomach is within normal limits. Appendix appears normal. No evidence of bowel wall thickening, distention, or inflammatory changes. Vascular/Lymphatic: Moderate aortoiliac and branch vessel atherosclerosis. No aneurysm. No adenopathy. Reproductive: Normal size prostate. Other: No free air nor free fluid. Musculoskeletal: Degenerative disc disease L5-S1. No aggressive osseous lesions. IMPRESSION: Extravasation of IV contrast into the patient's right arm. Study was flagged for contrast extravasation radiology PA follow-up should the patient be discharged. Chest CT: 1. Interval increase in spiculated right upper lobe mass now measuring approximately 5.8 x 5.3 x 5 cm with progression of metastatic mediastinal and hilar adenopathy as well as development of satellite pulmonary nodules in the right lung. Slight luminal narrowing of the distal right mainstem bronchus due to adenopathy. No occlusion noted however. 2. Ovoid density in the left major fissure may represent a pseudo lesion with fluid in the fissure versus an intra fissural mass measuring 2.2 x 1.2 x 1.7 cm. 3. Loculated moderate right pleural effusion. Abdomen and pelvic CT: 1. Marked urinary distention with volume of approximately 2 L. 2. Simple right interpolar renal cyst measuring 2.5 cm. 3. No apparent evidence of metastatic disease within the abdomen and pelvis given  limitations of a noncontrast exam. Electronically Signed   By: Ashley Royalty M.D.   On: 11/10/2017 22:55   Dg Chest 2 View  Result Date: 11/06/2017 CLINICAL DATA:  Cough, history of lung cancer. EXAM: CHEST - 2 VIEW COMPARISON:  05/18/2017 chest CT, CXR 05/18/2017 FINDINGS: Interval increase in size of masslike opacity in the right upper lobe, best appreciated on the sagittal view measuring approximately 4 x 3.5 x 4.8 cm with right paratracheal and perihilar soft tissue prominence concerning for progression of lymphadenopathy. Superimposed adjacent pneumonia is difficult to entirely exclude. Hyperinflated left lung without pulmonary consolidation or dominant mass. Heart size is top-normal. No aortic aneurysm. No suspicious osseous lesions. IMPRESSION: 1. Interval increase in size of right upper lobe posterior masslike abnormality now estimated at 4 x 3.5 x 4.8 cm, previously estimated at approximately 3.6 cm. 2. Interval increase in soft tissue prominence about the mediastinum and right hilum compatible with metastatic lymphadenopathy. Superimposed adjacent airspace opacities cannot exclude pneumonia and/or areas of atelectasis. Electronically Signed   By: Ashley Royalty M.D.   On: 10/19/2017 19:37   Dg Elbow Complete Left (3+view)  Result Date: 10/29/2017 CLINICAL DATA:  Elbow fracture. EXAM: LEFT ELBOW - COMPLETE 3+ VIEW; LEFT FOREARM - 2 VIEW COMPARISON:  Left elbow and forearm x-rays dated September 20, 2017. FINDINGS: Left elbow: Unchanged mild cortical step-off along the medial aspect of the capitellum. No new fracture. Persistent elbow joint effusion. Osteopenia. Soft tissues are unremarkable. Left forearm: No radius or ulna fracture. No focal bone lesion. Osteopenia. Soft tissues are unremarkable. IMPRESSION: 1. Unchanged minimally displaced fracture of the capitellum. Persistent elbow joint effusion.  2. No acute abnormality of the forearm. Electronically Signed   By: Titus Dubin M.D.   On: 10/29/2017 13:17    Dg Forearm Left  Result Date: 10/29/2017 CLINICAL DATA:  Elbow fracture. EXAM: LEFT ELBOW - COMPLETE 3+ VIEW; LEFT FOREARM - 2 VIEW COMPARISON:  Left elbow and forearm x-rays dated September 20, 2017. FINDINGS: Left elbow: Unchanged mild cortical step-off along the medial aspect of the capitellum. No new fracture. Persistent elbow joint effusion. Osteopenia. Soft tissues are unremarkable. Left forearm: No radius or ulna fracture. No focal bone lesion. Osteopenia. Soft tissues are unremarkable. IMPRESSION: 1. Unchanged minimally displaced fracture of the capitellum. Persistent elbow joint effusion. 2. No acute abnormality of the forearm. Electronically Signed   By: Titus Dubin M.D.   On: 10/29/2017 13:17   Ct Head Wo Contrast  Result Date: 11/11/2017 CLINICAL DATA:  [place] out of wheelchair today. Left forehead and left shoulder pain. Initial encounter. EXAM: CT HEAD WITHOUT CONTRAST CT CERVICAL SPINE WITHOUT CONTRAST TECHNIQUE: Multidetector CT imaging of the head and cervical spine was performed following the standard protocol without intravenous contrast. Multiplanar CT image reconstructions of the cervical spine were also generated. COMPARISON:  09/21/2017 FINDINGS: CT HEAD FINDINGS Brain: Right MCA territory encephalomalacia is unchanged as is more extensive right temporal lobe encephalomalacia which may be in part postsurgical. There is marked cerebral scratched of there is marked cerebellar atrophy. Periventricular white matter hypodensities are unchanged and nonspecific but compatible with mild chronic small vessel ischemic disease. No acute large territory infarct, intracranial hemorrhage, mass, midline shift, or extra-axial fluid collection is identified. Vascular: Calcified atherosclerosis at the skull base. No hyperdense vessel. Skull: Prior right pterional craniotomy.  No acute fracture. Sinuses/Orbits: The visualized paranasal sinuses and mastoid air cells are clear. The orbits are unremarkable.  Other: None. CT CERVICAL SPINE FINDINGS Alignment: Chronic reversal of the normal cervical lordosis. Unchanged grade 1 anterolisthesis of C2 on C3 and C3 on C4. Skull base and vertebrae: No acute fracture or suspicious osseous lesion. Soft tissues and spinal canal: No prevertebral fluid or swelling. No visible canal hematoma. Disc levels: Moderate diffuse cervical disc degeneration and moderate to severe left greater than right mid upper cervical facet arthrosis resulting in advanced multilevel neural foraminal stenosis. Upper chest: Right pleural effusion. Unchanged 3 mm left apical lung nodule. Other: Moderate calcified atherosclerosis at the carotid bifurcations. IMPRESSION: 1. No evidence of acute intracranial abnormality. 2. Right cerebral hemisphere encephalomalacia. Severe cerebellar atrophy. 3. No acute cervical spine fracture. Electronically Signed   By: Logan Bores M.D.   On: 10/20/2017 19:51   Ct Chest Wo Contrast  Result Date: 11/01/2017 CLINICAL DATA:  Left shoulder and forehead pain after fall this morning. Lung mass. EXAM: CT CHEST, ABDOMEN, AND PELVIS WITHOUT CONTRAST TECHNIQUE: Multidetector CT imaging of the chest, abdomen and pelvis was performed without IV contrast. CONTRAST:  The patient's IV infiltrated during imaging and the order was changed to CT of the chest, abdomen and pelvis without per ordering clinician. COMPARISON:  PET-CT 05/31/2017 FINDINGS: CT CHEST FINDINGS Cardiovascular: Top-normal size heart with left main and three-vessel coronary arteriosclerosis. Aortic atherosclerosis without aneurysm. The unenhanced pulmonary vasculature is difficult to assess beyond the main pulmonary artery due to soft tissue masses and adenopathy about the right hilum and mediastinum. Mediastinum/Nodes: Interval increase in masslike abnormalities in the mediastinum and right hilum compatible with progression of metastatic adenopathy and/or increase and extension of known right upper lobe mass.  Largest lymph node identified is subcarinal at 2.8  cm. Interval increase in right upper paratracheal lymph node measuring 1.1 cm. Exact margins of additional lymph node masses are difficult to delineate on this unenhanced study. There is luminal narrowing of the distal right mainstem bronchus without occlusion secondary to the right hilar, mediastinal and subcarinal masslike abnormalities. The thyroid gland is unremarkable. The esophagus is not well visualized. Lungs/Pleura: Partially loculated small to moderate right effusion. New pulmonary nodular masslike opacities are noted within the right upper, middle and lower lobes with interval progression in size of spiculated right upper lobe mass now measuring approximately 5.8 x 5.3 x 5 cm. Within the superior segment of the aerated right lower lobe are metastatic nodules measuring up to 2.2 cm. Additional nodules are seen in the right middle and anterior right lower lobe. Left lung remains clear. Nodular thickening along the left major fissure is seen which may represent fluid in the fissure versus an intra fissural mass. This measures 2.2 x 1.2 x 1.7 cm. Peribronchial thickening is noted within both lower lobes. Musculoskeletal: Remote right posterior seventh through ninth rib fractures. No suspicious osseous lesions. Extravasated contrast noted in the soft tissues of the right arm. CT ABDOMEN PELVIS FINDINGS Hepatobiliary: No biliary dilatation. The unenhanced liver is unremarkable. The gallbladder is physiologically distended. Pancreas: No ductal dilatation or inflammation. Spleen: No splenomegaly or mass. Adrenals/Urinary Tract: No adrenal mass. Interpolar 2.5 cm simple right renal cyst. Unremarkable noncontrast appearance of the left kidney. No obstructive uropathy. Marked bladder distention to the umbilicus with the bladder measuring 14.7 x 14.1 x 18.8 cm (volume = 2040 cm^3). Stomach/Bowel: Stomach is within normal limits. Appendix appears normal. No evidence  of bowel wall thickening, distention, or inflammatory changes. Vascular/Lymphatic: Moderate aortoiliac and branch vessel atherosclerosis. No aneurysm. No adenopathy. Reproductive: Normal size prostate. Other: No free air nor free fluid. Musculoskeletal: Degenerative disc disease L5-S1. No aggressive osseous lesions. IMPRESSION: Extravasation of IV contrast into the patient's right arm. Study was flagged for contrast extravasation radiology PA follow-up should the patient be discharged. Chest CT: 1. Interval increase in spiculated right upper lobe mass now measuring approximately 5.8 x 5.3 x 5 cm with progression of metastatic mediastinal and hilar adenopathy as well as development of satellite pulmonary nodules in the right lung. Slight luminal narrowing of the distal right mainstem bronchus due to adenopathy. No occlusion noted however. 2. Ovoid density in the left major fissure may represent a pseudo lesion with fluid in the fissure versus an intra fissural mass measuring 2.2 x 1.2 x 1.7 cm. 3. Loculated moderate right pleural effusion. Abdomen and pelvic CT: 1. Marked urinary distention with volume of approximately 2 L. 2. Simple right interpolar renal cyst measuring 2.5 cm. 3. No apparent evidence of metastatic disease within the abdomen and pelvis given limitations of a noncontrast exam. Electronically Signed   By: Ashley Royalty M.D.   On: 11/14/2017 22:55   Ct Cervical Spine Wo Contrast  Result Date: 11/14/2017 CLINICAL DATA:  [place] out of wheelchair today. Left forehead and left shoulder pain. Initial encounter. EXAM: CT HEAD WITHOUT CONTRAST CT CERVICAL SPINE WITHOUT CONTRAST TECHNIQUE: Multidetector CT imaging of the head and cervical spine was performed following the standard protocol without intravenous contrast. Multiplanar CT image reconstructions of the cervical spine were also generated. COMPARISON:  09/21/2017 FINDINGS: CT HEAD FINDINGS Brain: Right MCA territory encephalomalacia is unchanged as is  more extensive right temporal lobe encephalomalacia which may be in part postsurgical. There is marked cerebral scratched of there is marked cerebellar atrophy. Periventricular  white matter hypodensities are unchanged and nonspecific but compatible with mild chronic small vessel ischemic disease. No acute large territory infarct, intracranial hemorrhage, mass, midline shift, or extra-axial fluid collection is identified. Vascular: Calcified atherosclerosis at the skull base. No hyperdense vessel. Skull: Prior right pterional craniotomy.  No acute fracture. Sinuses/Orbits: The visualized paranasal sinuses and mastoid air cells are clear. The orbits are unremarkable. Other: None. CT CERVICAL SPINE FINDINGS Alignment: Chronic reversal of the normal cervical lordosis. Unchanged grade 1 anterolisthesis of C2 on C3 and C3 on C4. Skull base and vertebrae: No acute fracture or suspicious osseous lesion. Soft tissues and spinal canal: No prevertebral fluid or swelling. No visible canal hematoma. Disc levels: Moderate diffuse cervical disc degeneration and moderate to severe left greater than right mid upper cervical facet arthrosis resulting in advanced multilevel neural foraminal stenosis. Upper chest: Right pleural effusion. Unchanged 3 mm left apical lung nodule. Other: Moderate calcified atherosclerosis at the carotid bifurcations. IMPRESSION: 1. No evidence of acute intracranial abnormality. 2. Right cerebral hemisphere encephalomalacia. Severe cerebellar atrophy. 3. No acute cervical spine fracture. Electronically Signed   By: Logan Bores M.D.   On: 11/07/2017 19:51   Dg Chest Port 1 View  Result Date: 11/01/2017 CLINICAL DATA:  Shortness of breath EXAM: PORTABLE CHEST 1 VIEW COMPARISON:  10/29/2017 FINDINGS: Cardiac shadow is stable. The left lung remains clear. Old rib fractures on the left are noted. Large skin fold is noted over the left mid lung. Persistent increased density is noted in the right upper lobe  consistent with the known underlying mass lesion. Patchy infiltrative changes are noted in the right middle lobe as well. Old rib fractures on the right are seen. IMPRESSION: Stable right upper lobe mass lesion with associated patchy infiltrates similar to that noted on prior CT examination. No large pleural effusion is seen. Electronically Signed   By: Inez Catalina M.D.   On: 11/01/2017 09:53   Dg Chest Port 1 View  Result Date: 10/29/2017 CLINICAL DATA:  Status post thoracentesis. EXAM: PORTABLE CHEST 1 VIEW COMPARISON:  Chest radiograph 10/28/2017 FINDINGS: Negative for pneumothorax following thoracentesis. There may be a small amount of right pleural fluid present. Extensive airspace densities throughout the right lung particularly in the perihilar regions. The degree of right lung consolidation may have slightly increased. Left lung remains clear. Heart size is grossly stable. Old right rib fractures. IMPRESSION: Negative for pneumothorax following thoracentesis. Extensive airspace disease and consolidation in the right lung and cannot exclude progression since prior examination. Electronically Signed   By: Markus Daft M.D.   On: 10/29/2017 10:23   Dg Shoulder Left  Result Date: 11/01/2017 CLINICAL DATA:  [place] striking the left side today. Recent distal humeral fracture. EXAM: LEFT SHOULDER - 2+ VIEW COMPARISON:  Left humerus series of Nov 17, 2009 FINDINGS: There is chronic deformity of the distal clavicle. No acute clavicular fracture is observed. The scapula and proximal humerus appear normal. There is narrowing of the glenohumeral joint space. IMPRESSION: There is no acute fracture or dislocation of the left shoulder. 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 There is chronic deformity of the distal clavicle. Electronically Signed   By: David  Martinique M.D.   On: 11/09/2017 11:20   Dg Foot Complete Left  Result Date: 11/01/2017 CLINICAL DATA:  Left heel pain. EXAM: LEFT FOOT -  COMPLETE 3+ VIEW COMPARISON:  09/21/2017. FINDINGS: Osteopenia. Mild degenerative changes at the first metatarsophalangeal joint with hallux valgus. Single screw traverses the posterior  calcaneus with adjacent bony overgrowth. No acute osseous abnormality. IMPRESSION: 1. Postoperative changes in the calcaneus without acute osseous abnormality. 2. Mild first metatarsophalangeal joint osteoarthritis. Electronically Signed   By: Lorin Picket M.D.   On: 11/01/2017 09:57     LOS: 5 days   Signature  Lala Lund M.D on 11/01/2017 at 11:05 AM  Between 7am to 7pm - Pager - [phone] ( page via [email or web address], text pages only, please mention full 10 digit call back number).  After 7pm go to [email or web address] - password [facility]

## 2017-11-02 ENCOUNTER — Ambulatory Visit (HOSPITAL_COMMUNITY): Payer: Medicare HMO

## 2017-11-02 ENCOUNTER — Encounter (HOSPITAL_COMMUNITY): Admission: EM | Disposition: E | Payer: Self-pay | Source: Home / Self Care | Attending: Family Medicine

## 2017-11-02 HISTORY — PX: VIDEO BRONCHOSCOPY: SHX5072

## 2017-11-02 LAB — GLUCOSE, CAPILLARY
GLUCOSE-CAPILLARY: 195 mg/dL — AB (ref 65–99)
GLUCOSE-CAPILLARY: 133 mg/dL — AB (ref 65–99)
Glucose-Capillary: 147 mg/dL — ABNORMAL HIGH (ref 65–99)

## 2017-11-02 SURGERY — BRONCHOSCOPY, WITH FLUOROSCOPY
Anesthesia: Moderate Sedation | Laterality: Bilateral

## 2017-11-02 MED ORDER — LIDOCAINE HCL 2 % EX GEL: 1.0000 "application " | Freq: Once | CUTANEOUS | Status: DC

## 2017-11-02 MED ORDER — FENTANYL CITRATE (PF) 100 MCG/2ML IJ SOLN
INTRAMUSCULAR | Status: AC
  Filled 2017-11-02: qty 4

## 2017-11-02 MED ORDER — SODIUM CHLORIDE 0.9 % IV SOLN
Freq: Once | INTRAVENOUS | Status: AC
  Administered 2017-11-02: 07:00:00 via INTRAVENOUS

## 2017-11-02 MED ORDER — FENTANYL CITRATE (PF) 100 MCG/2ML IJ SOLN
INTRAMUSCULAR | Status: DC | PRN
  Administered 2017-11-02: 50 ug via INTRAVENOUS
  Administered 2017-11-02: 25 ug via INTRAVENOUS

## 2017-11-02 MED ORDER — EPINEPHRINE PF 1 MG/10ML IJ SOSY
PREFILLED_SYRINGE | INTRAMUSCULAR | Status: DC | PRN
  Administered 2017-11-02: 4 mL via ENDOTRACHEOPULMONARY

## 2017-11-02 MED ORDER — MIDAZOLAM HCL 10 MG/2ML IJ SOLN
INTRAMUSCULAR | Status: DC | PRN
  Administered 2017-11-02: 3 mg via INTRAVENOUS

## 2017-11-02 MED ORDER — LIDOCAINE HCL 1 % IJ SOLN
INTRAMUSCULAR | Status: DC | PRN
  Administered 2017-11-02: 6 mL via RESPIRATORY_TRACT

## 2017-11-02 MED ORDER — PHENYLEPHRINE HCL 0.25 % NA SOLN: 1.0000 | Freq: Four times a day (QID) | NASAL | Status: DC | PRN

## 2017-11-02 MED ORDER — BUTAMBEN-TETRACAINE-BENZOCAINE 2-2-14 % EX AERO: 1.0000 | INHALATION_SPRAY | Freq: Once | CUTANEOUS | Status: DC

## 2017-11-02 MED ORDER — LIDOCAINE HCL 2 % EX GEL
CUTANEOUS | Status: DC | PRN
  Administered 2017-11-02: 1

## 2017-11-02 MED ORDER — MIDAZOLAM HCL 5 MG/ML IJ SOLN
INTRAMUSCULAR | Status: AC
  Filled 2017-11-02: qty 2

## 2017-11-02 MED ORDER — PHENYLEPHRINE HCL 0.25 % NA SOLN
NASAL | Status: DC | PRN
  Administered 2017-11-02: 2 via NASAL

## 2017-11-02 NOTE — NC FL2 (Signed)
[place] LEVEL OF CARE SCREENING TOOL     IDENTIFICATION  Patient Name: Wyatt Salas Birthdate: 02-Feb-1951 Sex: male Admission Date (Current Location): 10/27/2017  [facility] and [place] Number:  Herbalist and Address:  [facility],  [place] [address], [place]      Provider Number: [id number]  Attending Physician Name and Address:  Thurnell Lose, MD  Relative Name and Phone Number:       Current Level of Care: Hospital Recommended Level of Care: [place] Prior Approval Number:    Date Approved/Denied:   PASRR Number: [id number].   Discharge Plan: SNF    Current Diagnoses: Patient Active Problem List   Diagnosis Date Noted  . Protein-calorie malnutrition, severe 10/28/2017  . Acute urinary retention 10/27/2017  . Atrial fibrillation with RVR ([place]) 10/27/2017  . Chronic diastolic CHF (congestive heart failure) ([place]) 10/27/2017  . Closed fracture of distal end of left humerus 10/27/2017  . Mass of upper lobe of right lung   . New onset atrial fibrillation ([place])   . Abnormal liver function   . Lung mass   . Essential hypertension   . Altered mental status 05/18/2017  . Right sided weakness 10/14/2016  . Acute urinary tract infection 10/14/2016  . Elevated troponin 10/14/2016  . Type 2 diabetes mellitus with hyperlipidemia ([place]) 10/14/2016  . Fall at home--mechanical 01/26/2014  . Elevated CK 01/26/2014  . Seizure disorder ([place]) 07/26/2013  . NSTEMI (non-ST elevated myocardial infarction) ([place]) 07/26/2013  . Hyperlipidemia 07/26/2013  . Aspiration pneumonia ([place]) 07/26/2013  . Rhabdomyolysis 07/26/2013    Orientation RESPIRATION BLADDER Height & Weight     Self, Situation, Place, Time  Normal Continent, Indwelling catheter Weight: 125 lb (56.7 kg) Height:  6\' 1"  (185.4 cm)  BEHAVIORAL SYMPTOMS/MOOD NEUROLOGICAL BOWEL NUTRITION STATUS      Continent Diet(soft diet)  AMBULATORY STATUS  COMMUNICATION OF NEEDS Skin   Extensive Assist Verbally Normal                       Personal Care Assistance Level of Assistance  Bathing, Feeding, Dressing Bathing Assistance: Limited assistance Feeding assistance: Independent Dressing Assistance: Limited assistance     Functional Limitations Info  Sight, Hearing, Speech Sight Info: Adequate Hearing Info: Adequate Speech Info: Adequate    SPECIAL CARE FACTORS FREQUENCY  PT (By licensed PT), OT (By licensed OT)     PT Frequency: 5x OT Frequency: 5x            Contractures Contractures Info: Not present    Additional Factors Info  Code Status, Allergies Code Status Info: full code Allergies Info: shellfish           Current Medications (10/30/2017):  This is the current hospital active medication list Current Facility-Administered Medications  Medication Dose Route Frequency Provider Last Rate Last Dose  . acetaminophen (TYLENOL) tablet 650 mg  650 mg Oral Q6H PRN Collene Gobble, MD   650 mg at 10/29/17 2145   Or  . acetaminophen (TYLENOL) suppository 650 mg  650 mg Rectal Q6H PRN Collene Gobble, MD      . aspirin chewable tablet 81 mg  81 mg Oral Daily Collene Gobble, MD   81 mg at 11/01/2017 1051  . atorvastatin (LIPITOR) tablet 40 mg  40 mg Oral q1800 Collene Gobble, MD   40 mg at 11/01/17 1609  . diltiazem (CARDIZEM) injection 10 mg  10 mg  Intravenous Q6H PRN Collene Gobble, MD   10 mg at 11/01/17 2228  . docusate sodium (COLACE) capsule 200 mg  200 mg Oral BID Collene Gobble, MD   200 mg at 11/10/2017 1051  . enoxaparin (LOVENOX) injection 40 mg  40 mg Subcutaneous Q24H Collene Gobble, MD   40 mg at 11/01/17 1522  . feeding supplement (ENSURE ENLIVE) (ENSURE ENLIVE) liquid 237 mL  237 mL Oral BID BM Collene Gobble, MD   237 mL at 10/18/2017 1051  . folic acid (FOLVITE) tablet 1 mg  1 mg Oral Daily Collene Gobble, MD   1 mg at 10/24/2017 1050  . guaiFENesin-dextromethorphan (ROBITUSSIN DM) 100-10 MG/5ML  syrup 5 mL  5 mL Oral Q4H PRN Collene Gobble, MD   5 mL at 10/23/2017 0216  . HYDROcodone-acetaminophen (NORCO/VICODIN) 5-325 MG per tablet 1-2 tablet  1-2 tablet Oral Q4H PRN Collene Gobble, MD   2 tablet at 11/01/17 1206  . insulin aspart (novoLOG) injection 0-5 Units  0-5 Units Subcutaneous QHS Collene Gobble, MD   0 Units at 10/28/17 2357  . insulin aspart (novoLOG) injection 0-9 Units  0-9 Units Subcutaneous TID WC Collene Gobble, MD   2 Units at 10/19/2017 1219  . lacosamide (VIMPAT) tablet 100 mg  100 mg Oral BID Collene Gobble, MD   100 mg at 10/22/2017 1049  . levETIRAcetam (KEPPRA) tablet 1,500 mg  1,500 mg Oral BID Collene Gobble, MD   1,500 mg at 11/07/2017 1050  . metoprolol tartrate (LOPRESSOR) tablet 25 mg  25 mg Oral BID Collene Gobble, MD   25 mg at 11/08/2017 1051  . multivitamin with minerals tablet 1 tablet  1 tablet Oral Daily Collene Gobble, MD   1 tablet at 10/28/2017 1052  . ondansetron (ZOFRAN) tablet 4 mg  4 mg Oral Q6H PRN Collene Gobble, MD       Or  . ondansetron (ZOFRAN) injection 4 mg  4 mg Intravenous Q6H PRN Collene Gobble, MD      . phenol (CHLORASEPTIC) mouth spray 1 spray  1 spray Mouth/Throat PRN Collene Gobble, MD   1 spray at 10/31/17 0130  . phenytoin (DILANTIN) ER capsule 30 mg  30 mg Oral BID Collene Gobble, MD   30 mg at 10/23/2017 1052  . polyethylene glycol (MIRALAX / GLYCOLAX) packet 17 g  17 g Oral Daily PRN Byrum, Rose Fillers, MD      . polyvinyl alcohol (LIQUIFILM TEARS) 1.4 % ophthalmic solution 1 drop  1 drop Both Eyes BID PRN Collene Gobble, MD      . sodium chloride flush (NS) 0.9 % injection 3 mL  3 mL Intravenous Q12H Collene Gobble, MD   3 mL at 11/04/2017 1052  . tamsulosin (FLOMAX) capsule 0.4 mg  0.4 mg Oral Daily Collene Gobble, MD   0.4 mg at 11/11/2017 1052  . thiamine (VITAMIN B-1) tablet 100 mg  100 mg Oral Daily Collene Gobble, MD   100 mg at 10/29/2017 1052     Discharge Medications: Please see discharge summary for a list of discharge  medications.  Relevant Imaging Results:  Relevant Lab Results:   Additional Information SS# [id number]  Nila Nephew, LCSW

## 2017-11-02 NOTE — Progress Notes (Signed)
Video bronchoscopy performed.  Intervention bronchial biopsy. Intervention bronchial brushing.  No complications noted.  Will continue to monitor.

## 2017-11-02 NOTE — Interval H&P Note (Signed)
PCCM Interval Note  Mr Hlavaty is NPO in prep for FOB to further evaluate RUL mass.  He understands the indications, risks, benefits.  All questions answered No new issues reported overnight - no barriers identified   Will proceed with FOB under conscious sedation, fluoro available if needed.   Baltazar Apo, MD, PhD 11/03/2017, 7:27 AM [facility] [phone] or if no answer [phone]

## 2017-11-02 NOTE — Evaluation (Signed)
Clinical/Bedside Swallow Evaluation Patient Details  Name: ROSWELL NDIAYE MRN: [id number] Date of Birth: 05-Apr-1951  Today's Date: 10/20/2017 Time: SLP Start Time (ACUTE ONLY): 8676 SLP Stop Time (ACUTE ONLY): 1835 SLP Time Calculation (min) (ACUTE ONLY): 25 min  Past Medical History:  Past Medical History:  Diagnosis Date  . Diabetes mellitus without complication ([place])    Type II  . Diastolic dysfunction    a. ECHO 07/2013 EF 55-60% and grade 1 diastolic dysfunction  . NSTEMI (non-ST elevated myocardial infarction) ([place])    problem list 01/2014  . Rhabdomyolysis 01/2014  . Seizures ([place])   . Syncope 01/2014  . Traumatic brain injury [facility])    Past Surgical History:  Past Surgical History:  Procedure Laterality Date  . CRANIOTOMY     HPI:  67 yo male adm to [facility] with AMS - Pt found to have right lung mass, TBI, SDH, ETOH use, seizures, essentially wheelchair bound - s/p falls with left humerus fx and ankle injury.  Pt is s/p thoracentesis 10/29/17 and bronchoscopy 10/28/2017 with findings abnormal cells.  Swallow eval ordered.   Imaging studies showed right cerebral encephalomalacia and severe cerebellar atrophy.    Assessment / Plan / Recommendation Clinical Impression  Pt presents with concerns for pharyngeal dysphagia characterized by weak, dysphonic voice - baseline congested coughing which is not productive!  Given pt has right lung mass - concern for vagus nerve involvement - ? compressive.  Also with h/o neuro hx, further testing indicated to allow determination of least restrictive diet.  Will proceed with MBS tomorrow am.  SLP Visit Diagnosis: Dysphagia, pharyngeal phase (R13.13)    Aspiration Risk  Severe aspiration risk;Risk for inadequate nutrition/hydration    Diet Recommendation Other (Comment)(MBS next date)   Liquid Administration via: Straw Medication Administration: (as tolerated) Supervision: Patient able to self feed Compensations: Minimize environmental  distractions;Slow rate;Small sips/bites Postural Changes: Seated upright at 90 degrees;Remain upright for at least 30 minutes after po intake    Other  Recommendations Oral Care Recommendations: Oral care QID   Follow up Recommendations Other (comment)(tbd)      Frequency and Duration     tbd       Prognosis  guarded for swallowing     Swallow Study   General Date of Onset: 10/30/2017 HPI: 67 yo male adm to [facility] with AMS - Pt found to have right lung mass, TBI, SDH, ETOH use, seizures, essentially wheelchair bound - s/p falls with left humerus fx and ankle injury.  Pt is s/p thoracentesis 10/29/17 and bronchoscopy 11/14/2017 with findings abnormal cells.  Swallow eval ordered.   Imaging studies showed right cerebral encephalomalacia and severe cerebellar atrophy.  Type of Study: Bedside Swallow Evaluation Diet Prior to this Study: Regular;Thin liquids Temperature Spikes Noted: No Respiratory Status: Nasal cannula History of Recent Intubation: No Behavior/Cognition: Alert;Cooperative Oral Care Completed by SLP: No Oral Cavity - Dentition: Other (Comment)(poor dentition) Vision: Functional for self-feeding Self-Feeding Abilities: Able to feed self Patient Positioning: Upright in bed Baseline Vocal Quality: Suspected CN X (Vagus) involvement;Low vocal intensity;Breathy;Hoarse;Wet Volitional Cough: Weak Volitional Swallow: Able to elicit    Oral/Motor/Sensory Function Overall Oral Motor/Sensory Function: Generalized oral weakness   Ice Chips Ice chips: Not tested   Thin Liquid Thin Liquid: Impaired Presentation: Straw Pharyngeal  Phase Impairments: Cough - Delayed    Nectar Thick Nectar Thick Liquid: Not tested   Honey Thick Honey Thick Liquid: Not tested   Puree Puree: Not tested   Solid   GO  Solid: Not tested        Macario Golds 10/20/2017,6:52 PM  Luanna Salk, [facility] SLP [phone]

## 2017-11-02 NOTE — Op Note (Signed)
[facility] Cardiopulmonary Patient Name: Wyatt Salas Procedure Date: 11/12/2017 MRN: [id number] Attending MD: Collene Gobble , MD Date of Birth: [birth year] CSN: [id number] Age: 67 Admit Type: Inpatient Ethnicity: Not Hispanic or Latino Procedure:            Bronchoscopy Indications:          Right upper lobe mass Providers:            Collene Gobble, MD, Cherre Huger RRT, RCP, Phillis Knack                        RRT, RCP Referring MD:          Medicines:            Midazolam 3 mg mg IV, Fentanyl 75 mcg IV, Lidocaine 1%                        applied to cords 8 mL, Lidocaine 1% applied to the                        tracheobronchial tree 12 mL, Epinephrine 1 mg/10 mL                        topical 4 mL Complications:        No immediate complications Estimated Blood Loss: Estimated blood loss was minimal. Procedure:      Pre-Anesthesia Assessment:      - A History and Physical has been performed. Patient meds and allergies       have been reviewed. The risks and benefits of the procedure and the       sedation options and risks were discussed with the patient. All       questions were answered and informed consent was obtained. Patient       identification and proposed procedure were verified prior to the       procedure by the physician in the procedure room. Mental Status       Examination: alert and oriented. Airway Examination: normal       oropharyngeal airway. Respiratory Examination: poor air movement and       rhonchi. CV Examination: normal. ASA Grade Assessment: III - A patient       with severe systemic disease. After reviewing the risks and benefits,       the patient was deemed in satisfactory condition to undergo the       procedure. The anesthesia plan was to use moderate sedation / analgesia       (conscious sedation). Immediately prior to administration of       medications, the patient was re-assessed for adequacy to receive       sedatives. The  heart rate, respiratory rate, oxygen saturations, blood       pressure, adequacy of pulmonary ventilation, and response to care were       monitored throughout the procedure. The physical status of the patient       was re-assessed after the procedure.      After obtaining informed consent, the bronchoscope was passed under       direct vision. Throughout the procedure, the patient's blood pressure,       pulse, and oxygen saturations were monitored continuously. the [id number]       ([id number]) scope was introduced through the left nostril and advanced to  the tracheobronchial tree. The procedure was accomplished without       difficulty. The patient tolerated the procedure well. Findings:      The nasopharynx/oropharynx appears normal. The larynx appears normal.       The vocal cords appear normal. The subglottic space is normal. The       trachea is of normal caliber. The carina is sharp. The tracheobronchial       tree of the left lung was examined to at least the first subsegmental       level. Bronchial mucosa and anatomy in the left lung are normal; there       are no endobronchial lesions, and no secretions.      Right Lung Abnormalities: A nearly obstructing (greater than 90%       obstructed) mass was found proximally, at the orifice in the right upper       lobe. Unable to traverse into the RUL proper. The mass was large and       endobronchial, exophytic, friable and polypoid, extended out into the       bronchus intermedius down the lateral wall, partially obstructing this       airway as well. Scope was able to pass and inspect the RML and RLL       airways which were grossly normal.      Endobronchial biopsies of a mass were performed in the right upper lobe       using a forceps and sent for histopathology examination. Five samples       were obtained.      Brushings of a mass were obtained in the right upper lobe with a       cytology brush and sent for routine cytology.  Three samples were       obtained. Impression:      - Right upper lobe mass      - The airway examination of the left lung was normal.      - An endobronchial, exophytic, friable and polypoid mass was found in       the right upper lobe. 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      - An endobronchial biopsy was performed.      - Brushings were obtained. Moderate Sedation:      Moderate (conscious) sedation was personally administered by the       endoscopist. The following parameters were monitored: oxygen saturation,       heart rate, blood pressure, respiratory rate, EKG, adequacy of pulmonary       ventilation, and response to care. Total physician intraservice time was       28 minutes. Recommendation:      - Await biopsy and brushing results. Procedure Code(s):      --- Professional ---      [phone], Bronchoscopy, rigid or flexible, including fluoroscopic guidance,       when performed; with bronchial or endobronchial biopsy(s), single or       multiple sites      31623, Bronchoscopy, rigid or flexible, including fluoroscopic guidance,       when performed; with brushing or protected brushings      99152, Moderate sedation services provided by the same physician or       other qualified health care professional performing the diagnostic or       therapeutic service that the sedation supports, requiring the presence       of an independent trained observer to  assist in the monitoring of the       patient's level of consciousness and physiological status; initial 15       minutes of intraservice time, patient age 19 years or older      [id number], Moderate sedation services provided by the same physician or       other qualified health care professional performing the diagnostic or       therapeutic service that the sedation supports, requiring the presence       of an independent trained observer to assist in the monitoring of the       patient's level of consciousness and physiological status; each        additional 15 minutes intraservice time (List separately in addition to       code for primary service) Diagnosis Code(s):      --- Professional ---      R91.8, Other nonspecific abnormal finding of lung field      C34.11, Malignant neoplasm of upper lobe, right bronchus or lung CPT copyright 2017 [employer]. All rights reserved. The codes documented in this report are preliminary and upon coder review may  be revised to meet current compliance requirements. Collene Gobble, MD Collene Gobble, MD 10/22/2017 8:17:04 AM Number of Addenda: 0 Scope In: 2:26:33 AM Scope Out: 7:53:38 AM

## 2017-11-02 NOTE — Progress Notes (Signed)
PROGRESS NOTE        PATIENT DETAILS Name: Wyatt Salas Age: 67 y.o. Sex: male Date of Birth: 1950-12-09 Admit Date: 11/01/2017 Admitting Physician Vianne Bulls, MD NOM:[facility], [name], MD  Brief Narrative: Patient is a 67 y.o. male with history of alcohol use, seizure disorder, DM-2, hypertension, known lung mass (requested second opinion in May 2018) presented to the ED following a mechanical fall, found to be in A. fib with RVR and admitted to the hospitalist service.  Subjective: Patient in bed, appears comfortable, denies any headache, no fever, no chest pain or pressure, no shortness of breath , no abdominal pain. No focal weakness.  Mild Left upper extremity pain at the site of humerus fracture.  Was having lunch and coughing while swallowing.   Assessment/Plan:  Atrial fibrillation with RVR: [place] vas 2 score of 2.  Poor candidate for anticoagulation due to history of alcohol abuse and falls and subdural hematoma in the past, also noncompliant.  Initially required Cardizem infusion now in rate control on metoprolol along with 81 mg of aspirin, on 11/01/2017 developed some RVR, PRN IV Cardizem along with 2 doses of IV digoxin will be given for rate control.  Continue to monitor if rate becomes an issue will add low-dose digoxin to his regimen, recent echocardiogram in March 2018 shows a preserved EF of 55%.   Acute urinary retention: On Flomax, failed Foley removal, Foley placed on 10/30/2017 likely will go to SNF with Foley catheter and outpatient urology follow-up.  Lung mass: Apparently was scheduled for a bronchoscopy late last year-but [facility]-he now reports acceptance and willingness to proceed with workup if offered at this time.  Underwent thoracentesis and cytology came back yesterday positive for atypical cells, underwent video-assisted bronchoscopy by pulmonary on 10/27/2017 showing a lung mass which was biopsied results  pending, he has extremely poor healthcare related insight hence palliative care is also following .  He is a extremely frail gentleman with poor functional status and would likely not survive chemotherapy or radiation let alone a big lung surgery.  Coughing today when he eats.  Will check speech eval, change diet to soft with feeding assistance and aspiration precautions.  Monitor closely.    Hypertension: Stable on beta-blocker will monitor.  Seizure disorder: Continue Keppra/Dilantin and Vimpat  Left humerus fracture: Due to mechanical fall that the patient had on early March, he was seen in the emergency room on 3/5 and had a splint placed-and he was supposed to follow-up with outpatient orthopedics. He unfortunately never followed up-Ortho was consulted on 4/11- splint was removed on 4/11-PT OT and also are following.  Wrist splint ordered.  Per Bobette Mo outpatient follow-up with Dr. [name] post discharge in a week.  History of alcohol use: Claims he does not use alcohol any longer-claims that his last use was 1 year back-but he had an admission in this facility and late last year where he had alcohol withdrawal symptoms.  No withdrawal symptoms at this time-continue Ativan per protocol.  Monitor for DTs, continue folic acid and thiamine supplementation.  Counseled to quit.  Deconditioning/debility/frequent falls-await PT evaluation require SNF.  Left ankle pain -   X-ray stable likely arthritis related, supportive care and PT.    DM type II.  On sliding scale continue.  CBG (last 3)  Recent  Labs    11/01/17 1651 11/01/17 2106 10/31/2017 1023  GLUCAP 165* 141* 195*      Telemetry (independently reviewed): Afib  DVT Prophylaxis: Prophylactic Lovenox  Code Status: Full code   Family Communication: None at bedside  Disposition Plan: Remain inpatient-likely transfer to SNF next week  Antimicrobial agents: Anti-infectives (From admission, onward)   None       Procedures: None  CONSULTS:   cardiology  Time spent: 25- minutes-Greater than 50% of this time was spent in counseling, explanation of diagnosis, planning of further management, and coordination of care.  MEDICATIONS:  Scheduled Meds: . aspirin  81 mg Oral Daily  . atorvastatin  40 mg Oral q1800  . docusate sodium  200 mg Oral BID  . enoxaparin (LOVENOX) injection  40 mg Subcutaneous Q24H  . feeding supplement (ENSURE ENLIVE)  237 mL Oral BID BM  . folic acid  1 mg Oral Daily  . insulin aspart  0-5 Units Subcutaneous QHS  . insulin aspart  0-9 Units Subcutaneous TID WC  . lacosamide  100 mg Oral BID  . levETIRAcetam  1,500 mg Oral BID  . metoprolol tartrate  25 mg Oral BID  . multivitamin with minerals  1 tablet Oral Daily  . phenytoin  30 mg Oral BID  . sodium chloride flush  3 mL Intravenous Q12H  . tamsulosin  0.4 mg Oral Daily  . thiamine  100 mg Oral Daily   Continuous Infusions:  PRN Meds:.acetaminophen **OR** acetaminophen, diltiazem, guaiFENesin-dextromethorphan, HYDROcodone-acetaminophen, ondansetron **OR** ondansetron (ZOFRAN) IV, phenol, polyethylene glycol, polyvinyl alcohol   PHYSICAL EXAM: Vital signs: Vitals:   10/23/2017 0755 11/15/2017 0800 11/12/2017 0815 10/27/2017 0820  BP: 135/75 132/67  105/65  Pulse:      Resp: (!) 29 (!) 25 (!) 25 (!) 24  Temp:      TempSrc:      SpO2: 97% 96% 98% 92%  Weight:      Height:       Filed Weights   10/31/17 0357 11/01/17 0608 11/03/2017 0646  Weight: 57.6 kg (126 lb 15.8 oz) 57.1 kg (125 lb 14.1 oz) 56.7 kg (125 lb)   Body mass index is 16.49 kg/m.   Exam  Awake Alert, No new F.N deficits, Normal affect [place].AT,PERRAL Supple Neck,No JVD, No cervical lymphadenopathy appriciated.  Symmetrical Chest wall movement, Good air movement bilaterally, CTAB RRR,No Gallops, Rubs or new Murmurs, No Parasternal Heave +ve B.Sounds, Abd Soft, No tenderness, No organomegaly appriciated, No rebound - guarding or  rigidity. No Cyanosis, Clubbing or edema, No new Rash or bruise  I have personally reviewed following labs and imaging studies  LABORATORY DATA:  CBC: Recent Labs  Lab 10/29/2017 2052 10/28/17 0319 10/29/17 0426 10/31/17 0422 11/01/17 0506  WBC 11.1* 9.5 9.1 9.7 10.4  NEUTROABS 8.4*  --   --   --   --   HGB 12.0* 11.1* 10.8* 11.3* 10.6*  HCT 38.0* 36.2* 34.8* 36.2* 33.5*  MCV 83.9 83.4 82.3 82.3 81.9  PLT 391 370 346 328 741    Basic Metabolic Panel: Recent Labs  Lab 10/28/17 0001 10/28/17 0319 10/29/17 0426 10/31/17 0422 11/01/17 0506  NA 138 133* 135 132* 133*  K 3.5 2.9* 3.8 4.6 4.5  CL 101 98* 101 98* 100*  CO2 23 22 25 24 23   GLUCOSE 175* 258* 138* 162* 170*  BUN 11 10 8 10 9   CREATININE 0.72 0.62 0.54* 0.56* 0.53*  CALCIUM 9.5 8.8* 9.0 9.0 9.1  MG 1.9 1.7  1.9 1.7 1.7    GFR: Estimated Creatinine Clearance: 70.9 mL/min (A) (by C-G formula based on SCr of 0.53 mg/dL (L)).  Liver Function Tests: Recent Labs  Lab 10/28/2017 2052 10/28/17 0319 10/29/17 1046 10/31/17 0422  AST 24 40  --  38  ALT 18 28  --  33  ALKPHOS 81 69  --  75  BILITOT 0.6 0.5  --  0.2*  PROT 7.8 6.1* 5.9* 6.0*  ALBUMIN 3.3* 2.5*  --  2.3*   Recent Labs  Lab 10/20/2017 2052  LIPASE 26   No results for input(s): AMMONIA in the last 168 hours.  Coagulation Profile: Recent Labs  Lab 10/27/17 1043  INR 1.11    Cardiac Enzymes: No results for input(s): CKTOTAL, CKMB, CKMBINDEX, TROPONINI in the last 168 hours.  BNP (last 3 results) No results for input(s): PROBNP in the last 8760 hours.  HbA1C: No results for input(s): HGBA1C in the last 72 hours.  CBG: Recent Labs  Lab 11/01/17 0735 11/01/17 1154 11/01/17 1651 11/01/17 2106 10/25/2017 1023  GLUCAP 139* 161* 165* 141* 195*    Lipid Profile: No results for input(s): CHOL, HDL, LDLCALC, TRIG, CHOLHDL, LDLDIRECT in the last 72 hours.  Thyroid Function Tests: No results for input(s): TSH, T4TOTAL, FREET4, T3FREE,  THYROIDAB in the last 72 hours.  Anemia Panel: No results for input(s): VITAMINB12, FOLATE, FERRITIN, TIBC, IRON, RETICCTPCT in the last 72 hours.  Urine analysis:    Component Value Date/Time   COLORURINE AMBER (A) 11/06/2017 2311   APPEARANCEUR CLEAR 10/21/2017 2311   LABSPEC 1.026 10/24/2017 2311   PHURINE 5.0 10/25/2017 2311   GLUCOSEU NEGATIVE 11/06/2017 2311   HGBUR NEGATIVE 11/14/2017 2311   BILIRUBINUR NEGATIVE 11/01/2017 2311   KETONESUR NEGATIVE 11/01/2017 2311   PROTEINUR 30 (A) 10/18/2017 2311   UROBILINOGEN 1.0 01/25/2014 1700   NITRITE NEGATIVE 11/07/2017 2311   LEUKOCYTESUR NEGATIVE 10/23/2017 2311    Sepsis Labs: Lactic Acid, Venous    Component Value Date/Time   LATICACIDVEN 2.0 04/20/2010 1910    MICROBIOLOGY: Recent Results (from the past 240 hour(s))  Urine culture     Status: Abnormal   Collection Time: 11/04/2017 11:19 PM  Result Value Ref Range Status   Specimen Description   Final    URINE, CATHETERIZED Performed at [facility], [place] [address], [place], [place] [address]    Special Requests   Final    NONE Performed at [facility], [place] [address], [place], [place] [address]    Culture (A)  Final    <10,000 COLONIES/mL INSIGNIFICANT GROWTH Performed at [place] [address], [place], [place] [address]    Report Status 10/28/2017 FINAL  Final  MRSA PCR Screening     Status: None   Collection Time: 10/27/17  9:21 AM  Result Value Ref Range Status   MRSA by PCR NEGATIVE NEGATIVE Final    Comment:        The GeneXpert MRSA Assay (FDA approved for NASAL specimens only), is one component of a comprehensive MRSA colonization surveillance program. It is not intended to diagnose MRSA infection nor to guide or monitor treatment for MRSA infections. Performed at [facility], [place] [address], [place], [place] [address]   Culture, sputum-assessment     Status: None    Collection Time: 10/27/17  3:30 PM  Result Value Ref Range Status   Specimen Description EXPECTORATED SPUTUM  Final   Special Requests NONE  Final  Sputum evaluation   Final    THIS SPECIMEN IS ACCEPTABLE FOR SPUTUM CULTURE Performed at [place] [address], [place], [place] [address]    Report Status 10/27/2017 FINAL  Final  Culture, respiratory (NON-Expectorated)     Status: None   Collection Time: 10/27/17  3:30 PM  Result Value Ref Range Status   Specimen Description   Final    EXPECTORATED SPUTUM Performed at [facility], [place] [address], [place], [place] [address]    Special Requests   Final    NONE Reflexed from [id number] Performed at [facility], [place] [address] [place], [place] [address]    Gram Stain   Final    FEW WBC PRESENT,BOTH PMN AND MONONUCLEAR MODERATE GRAM POSITIVE COCCI    Culture   Final    Consistent with normal respiratory flora. Performed at [facility], [place] [address], [place], [place] [address]    Report Status 10/30/2017 FINAL  Final  Body fluid culture     Status: None   Collection Time: 10/29/17 10:26 AM  Result Value Ref Range Status   Specimen Description   Final    PLEURAL RIGHT Performed at [place] [address], [place], [place] [address]    Special Requests   Final    NONE Performed at [facility], [place] [address], [place], [place] [address]    Gram Stain   Final    FEW WBC PRESENT, PREDOMINANTLY MONONUCLEAR NO ORGANISMS SEEN    Culture   Final    NO GROWTH 3 DAYS Performed at [place] [address] [place], [place] [address]    Report Status 11/01/2017 FINAL  Final    RADIOLOGY STUDIES/RESULTS: Ct Abdomen Pelvis Wo Contrast  Result Date: 11/01/2017 CLINICAL DATA:  Left shoulder and forehead pain after fall this morning. Lung mass. EXAM: CT CHEST, ABDOMEN, AND PELVIS WITHOUT CONTRAST TECHNIQUE:  Multidetector CT imaging of the chest, abdomen and pelvis was performed without IV contrast. CONTRAST:  The patient's IV infiltrated during imaging and the order was changed to CT of the chest, abdomen and pelvis without per ordering clinician. COMPARISON:  PET-CT 05/31/2017 FINDINGS: CT CHEST FINDINGS Cardiovascular: Top-normal size heart with left main and three-vessel coronary arteriosclerosis. Aortic atherosclerosis without aneurysm. The unenhanced pulmonary vasculature is difficult to assess beyond the main pulmonary artery due to soft tissue masses and adenopathy about the right hilum and mediastinum. Mediastinum/Nodes: Interval increase in masslike abnormalities in the mediastinum and right hilum compatible with progression of metastatic adenopathy and/or increase and extension of known right upper lobe mass. Largest lymph node identified is subcarinal at 2.8 cm. Interval increase in right upper paratracheal lymph node measuring 1.1 cm. Exact margins of additional lymph node masses are difficult to delineate on this unenhanced study. There is luminal narrowing of the distal right mainstem bronchus without occlusion secondary to the right hilar, mediastinal and subcarinal masslike abnormalities. The thyroid gland is unremarkable. The esophagus is not well visualized. Lungs/Pleura: Partially loculated small to moderate right effusion. New pulmonary nodular masslike opacities are noted within the right upper, middle and lower lobes with interval progression in size of spiculated right upper lobe mass now measuring approximately 5.8 x 5.3 x 5 cm. Within the superior segment of the aerated right lower lobe are metastatic nodules measuring up to 2.2 cm. Additional nodules are seen in the right middle and anterior right lower lobe. Left lung remains clear.  Nodular thickening along the left major fissure is seen which may represent fluid in the fissure versus an intra fissural mass. This measures 2.2 x 1.2 x 1.7 cm.  Peribronchial thickening is noted within both lower lobes. Musculoskeletal: Remote right posterior seventh through ninth rib fractures. No suspicious osseous lesions. Extravasated contrast noted in the soft tissues of the right arm. CT ABDOMEN PELVIS FINDINGS Hepatobiliary: No biliary dilatation. The unenhanced liver is unremarkable. The gallbladder is physiologically distended. Pancreas: No ductal dilatation or inflammation. Spleen: No splenomegaly or mass. Adrenals/Urinary Tract: No adrenal mass. Interpolar 2.5 cm simple right renal cyst. Unremarkable noncontrast appearance of the left kidney. No obstructive uropathy. Marked bladder distention to the umbilicus with the bladder measuring 14.7 x 14.1 x 18.8 cm (volume = 2040 cm^3). Stomach/Bowel: Stomach is within normal limits. Appendix appears normal. No evidence of bowel wall thickening, distention, or inflammatory changes. Vascular/Lymphatic: Moderate aortoiliac and branch vessel atherosclerosis. No aneurysm. No adenopathy. Reproductive: Normal size prostate. Other: No free air nor free fluid. Musculoskeletal: Degenerative disc disease L5-S1. No aggressive osseous lesions. IMPRESSION: Extravasation of IV contrast into the patient's right arm. Study was flagged for contrast extravasation radiology PA follow-up should the patient be discharged. Chest CT: 1. Interval increase in spiculated right upper lobe mass now measuring approximately 5.8 x 5.3 x 5 cm with progression of metastatic mediastinal and hilar adenopathy as well as development of satellite pulmonary nodules in the right lung. Slight luminal narrowing of the distal right mainstem bronchus due to adenopathy. No occlusion noted however. 2. Ovoid density in the left major fissure may represent a pseudo lesion with fluid in the fissure versus an intra fissural mass measuring 2.2 x 1.2 x 1.7 cm. 3. Loculated moderate right pleural effusion. Abdomen and pelvic CT: 1. Marked urinary distention with volume of  approximately 2 L. 2. Simple right interpolar renal cyst measuring 2.5 cm. 3. No apparent evidence of metastatic disease within the abdomen and pelvis given limitations of a noncontrast exam. Electronically Signed   By: Ashley Royalty M.D.   On: 10/28/2017 22:55   Dg Chest 2 View  Result Date: 10/24/2017 CLINICAL DATA:  Cough, history of lung cancer. EXAM: CHEST - 2 VIEW COMPARISON:  05/18/2017 chest CT, CXR 05/18/2017 FINDINGS: Interval increase in size of masslike opacity in the right upper lobe, best appreciated on the sagittal view measuring approximately 4 x 3.5 x 4.8 cm with right paratracheal and perihilar soft tissue prominence concerning for progression of lymphadenopathy. Superimposed adjacent pneumonia is difficult to entirely exclude. Hyperinflated left lung without pulmonary consolidation or dominant mass. Heart size is top-normal. No aortic aneurysm. No suspicious osseous lesions. IMPRESSION: 1. Interval increase in size of right upper lobe posterior masslike abnormality now estimated at 4 x 3.5 x 4.8 cm, previously estimated at approximately 3.6 cm. 2. Interval increase in soft tissue prominence about the mediastinum and right hilum compatible with metastatic lymphadenopathy. Superimposed adjacent airspace opacities cannot exclude pneumonia and/or areas of atelectasis. Electronically Signed   By: Ashley Royalty M.D.   On: 11/15/2017 19:37   Dg Elbow Complete Left (3+view)  Result Date: 10/29/2017 CLINICAL DATA:  Elbow fracture. EXAM: LEFT ELBOW - COMPLETE 3+ VIEW; LEFT FOREARM - 2 VIEW COMPARISON:  Left elbow and forearm x-rays dated September 20, 2017. FINDINGS: Left elbow: Unchanged mild cortical step-off along the medial aspect of the capitellum. No new fracture. Persistent elbow joint effusion. Osteopenia. Soft tissues are unremarkable. Left forearm: No radius or ulna fracture. No focal bone lesion. Osteopenia.  Soft tissues are unremarkable. IMPRESSION: 1. Unchanged minimally displaced fracture of the  capitellum. Persistent elbow joint effusion. 2. No acute abnormality of the forearm. Electronically Signed   By: Titus Dubin M.D.   On: 10/29/2017 13:17   Dg Forearm Left  Result Date: 10/29/2017 CLINICAL DATA:  Elbow fracture. EXAM: LEFT ELBOW - COMPLETE 3+ VIEW; LEFT FOREARM - 2 VIEW COMPARISON:  Left elbow and forearm x-rays dated September 20, 2017. FINDINGS: Left elbow: Unchanged mild cortical step-off along the medial aspect of the capitellum. No new fracture. Persistent elbow joint effusion. Osteopenia. Soft tissues are unremarkable. Left forearm: No radius or ulna fracture. No focal bone lesion. Osteopenia. Soft tissues are unremarkable. IMPRESSION: 1. Unchanged minimally displaced fracture of the capitellum. Persistent elbow joint effusion. 2. No acute abnormality of the forearm. Electronically Signed   By: Titus Dubin M.D.   On: 10/29/2017 13:17   Ct Head Wo Contrast  Result Date: 11/04/2017 CLINICAL DATA:  [place] [address] out of wheelchair today. Left forehead and left shoulder pain. Initial encounter. EXAM: CT HEAD WITHOUT CONTRAST CT CERVICAL SPINE WITHOUT CONTRAST TECHNIQUE: Multidetector CT imaging of the head and cervical spine was performed following the standard protocol without intravenous contrast. Multiplanar CT image reconstructions of the cervical spine were also generated. COMPARISON:  09/21/2017 FINDINGS: CT HEAD FINDINGS Brain: Right MCA territory encephalomalacia is unchanged as is more extensive right temporal lobe encephalomalacia which may be in part postsurgical. There is marked cerebral scratched of there is marked cerebellar atrophy. Periventricular white matter hypodensities are unchanged and nonspecific but compatible with mild chronic small vessel ischemic disease. No acute large territory infarct, intracranial hemorrhage, mass, midline shift, or extra-axial fluid collection is identified. Vascular: Calcified atherosclerosis at the skull base. No hyperdense vessel. Skull: Prior  right pterional craniotomy.  No acute fracture. Sinuses/Orbits: The visualized paranasal sinuses and mastoid air cells are clear. The orbits are unremarkable. Other: None. CT CERVICAL SPINE FINDINGS Alignment: Chronic reversal of the normal cervical lordosis. Unchanged grade 1 anterolisthesis of C2 on C3 and C3 on C4. Skull base and vertebrae: No acute fracture or suspicious osseous lesion. Soft tissues and spinal canal: No prevertebral fluid or swelling. No visible canal hematoma. Disc levels: Moderate diffuse cervical disc degeneration and moderate to severe left greater than right mid upper cervical facet arthrosis resulting in advanced multilevel neural foraminal stenosis. Upper chest: Right pleural effusion. Unchanged 3 mm left apical lung nodule. Other: Moderate calcified atherosclerosis at the carotid bifurcations. IMPRESSION: 1. No evidence of acute intracranial abnormality. 2. Right cerebral hemisphere encephalomalacia. Severe cerebellar atrophy. 3. No acute cervical spine fracture. Electronically Signed   By: Logan Bores M.D.   On: 11/10/2017 19:51   Ct Chest Wo Contrast  Result Date: 10/28/2017 CLINICAL DATA:  Left shoulder and forehead pain after fall this morning. Lung mass. EXAM: CT CHEST, ABDOMEN, AND PELVIS WITHOUT CONTRAST TECHNIQUE: Multidetector CT imaging of the chest, abdomen and pelvis was performed without IV contrast. CONTRAST:  The patient's IV infiltrated during imaging and the order was changed to CT of the chest, abdomen and pelvis without per ordering clinician. COMPARISON:  PET-CT 05/31/2017 FINDINGS: CT CHEST FINDINGS Cardiovascular: Top-normal size heart with left main and three-vessel coronary arteriosclerosis. Aortic atherosclerosis without aneurysm. The unenhanced pulmonary vasculature is difficult to assess beyond the main pulmonary artery due to soft tissue masses and adenopathy about the right hilum and mediastinum. Mediastinum/Nodes: Interval increase in masslike  abnormalities in the mediastinum and right hilum compatible with progression of metastatic adenopathy and/or  increase and extension of known right upper lobe mass. Largest lymph node identified is subcarinal at 2.8 cm. Interval increase in right upper paratracheal lymph node measuring 1.1 cm. Exact margins of additional lymph node masses are difficult to delineate on this unenhanced study. There is luminal narrowing of the distal right mainstem bronchus without occlusion secondary to the right hilar, mediastinal and subcarinal masslike abnormalities. The thyroid gland is unremarkable. The esophagus is not well visualized. Lungs/Pleura: Partially loculated small to moderate right effusion. New pulmonary nodular masslike opacities are noted within the right upper, middle and lower lobes with interval progression in size of spiculated right upper lobe mass now measuring approximately 5.8 x 5.3 x 5 cm. Within the superior segment of the aerated right lower lobe are metastatic nodules measuring up to 2.2 cm. Additional nodules are seen in the right middle and anterior right lower lobe. Left lung remains clear. Nodular thickening along the left major fissure is seen which may represent fluid in the fissure versus an intra fissural mass. This measures 2.2 x 1.2 x 1.7 cm. Peribronchial thickening is noted within both lower lobes. Musculoskeletal: Remote right posterior seventh through ninth rib fractures. No suspicious osseous lesions. Extravasated contrast noted in the soft tissues of the right arm. CT ABDOMEN PELVIS FINDINGS Hepatobiliary: No biliary dilatation. The unenhanced liver is unremarkable. The gallbladder is physiologically distended. Pancreas: No ductal dilatation or inflammation. Spleen: No splenomegaly or mass. Adrenals/Urinary Tract: No adrenal mass. Interpolar 2.5 cm simple right renal cyst. Unremarkable noncontrast appearance of the left kidney. No obstructive uropathy. Marked bladder distention to the  umbilicus with the bladder measuring 14.7 x 14.1 x 18.8 cm (volume = 2040 cm^3). Stomach/Bowel: Stomach is within normal limits. Appendix appears normal. No evidence of bowel wall thickening, distention, or inflammatory changes. Vascular/Lymphatic: Moderate aortoiliac and branch vessel atherosclerosis. No aneurysm. No adenopathy. Reproductive: Normal size prostate. Other: No free air nor free fluid. Musculoskeletal: Degenerative disc disease L5-S1. No aggressive osseous lesions. IMPRESSION: Extravasation of IV contrast into the patient's right arm. Study was flagged for contrast extravasation radiology PA follow-up should the patient be discharged. Chest CT: 1. Interval increase in spiculated right upper lobe mass now measuring approximately 5.8 x 5.3 x 5 cm with progression of metastatic mediastinal and hilar adenopathy as well as development of satellite pulmonary nodules in the right lung. Slight luminal narrowing of the distal right mainstem bronchus due to adenopathy. No occlusion noted however. 2. Ovoid density in the left major fissure may represent a pseudo lesion with fluid in the fissure versus an intra fissural mass measuring 2.2 x 1.2 x 1.7 cm. 3. Loculated moderate right pleural effusion. Abdomen and pelvic CT: 1. Marked urinary distention with volume of approximately 2 L. 2. Simple right interpolar renal cyst measuring 2.5 cm. 3. No apparent evidence of metastatic disease within the abdomen and pelvis given limitations of a noncontrast exam. Electronically Signed   By: Ashley Royalty M.D.   On: 11/12/2017 22:55   Ct Cervical Spine Wo Contrast  Result Date: 10/25/2017 CLINICAL DATA:  [place] out of wheelchair today. Left forehead and left shoulder pain. Initial encounter. EXAM: CT HEAD WITHOUT CONTRAST CT CERVICAL SPINE WITHOUT CONTRAST TECHNIQUE: Multidetector CT imaging of the head and cervical spine was performed following the standard protocol without intravenous contrast. Multiplanar CT image  reconstructions of the cervical spine were also generated. COMPARISON:  09/21/2017 FINDINGS: CT HEAD FINDINGS Brain: Right MCA territory encephalomalacia is unchanged as is more extensive right temporal lobe encephalomalacia which  may be in part postsurgical. There is marked cerebral scratched of there is marked cerebellar atrophy. Periventricular white matter hypodensities are unchanged and nonspecific but compatible with mild chronic small vessel ischemic disease. No acute large territory infarct, intracranial hemorrhage, mass, midline shift, or extra-axial fluid collection is identified. Vascular: Calcified atherosclerosis at the skull base. No hyperdense vessel. Skull: Prior right pterional craniotomy.  No acute fracture. Sinuses/Orbits: The visualized paranasal sinuses and mastoid air cells are clear. The orbits are unremarkable. Other: None. CT CERVICAL SPINE FINDINGS Alignment: Chronic reversal of the normal cervical lordosis. Unchanged grade 1 anterolisthesis of C2 on C3 and C3 on C4. Skull base and vertebrae: No acute fracture or suspicious osseous lesion. Soft tissues and spinal canal: No prevertebral fluid or swelling. No visible canal hematoma. Disc levels: Moderate diffuse cervical disc degeneration and moderate to severe left greater than right mid upper cervical facet arthrosis resulting in advanced multilevel neural foraminal stenosis. Upper chest: Right pleural effusion. Unchanged 3 mm left apical lung nodule. Other: Moderate calcified atherosclerosis at the carotid bifurcations. IMPRESSION: 1. No evidence of acute intracranial abnormality. 2. Right cerebral hemisphere encephalomalacia. Severe cerebellar atrophy. 3. No acute cervical spine fracture. Electronically Signed   By: Logan Bores M.D.   On: 11/08/2017 19:51   Dg Chest Port 1 View  Result Date: 11/01/2017 CLINICAL DATA:  Shortness of breath EXAM: PORTABLE CHEST 1 VIEW COMPARISON:  10/29/2017 FINDINGS: Cardiac shadow is stable. The left  lung remains clear. Old rib fractures on the left are noted. Large skin fold is noted over the left mid lung. Persistent increased density is noted in the right upper lobe consistent with the known underlying mass lesion. Patchy infiltrative changes are noted in the right middle lobe as well. Old rib fractures on the right are seen. IMPRESSION: Stable right upper lobe mass lesion with associated patchy infiltrates similar to that noted on prior CT examination. No large pleural effusion is seen. Electronically Signed   By: Inez Catalina M.D.   On: 11/01/2017 09:53   Dg Chest Port 1 View  Result Date: 10/29/2017 CLINICAL DATA:  Status post thoracentesis. EXAM: PORTABLE CHEST 1 VIEW COMPARISON:  Chest radiograph 10/19/2017 FINDINGS: Negative for pneumothorax following thoracentesis. There may be a small amount of right pleural fluid present. Extensive airspace densities throughout the right lung particularly in the perihilar regions. The degree of right lung consolidation may have slightly increased. Left lung remains clear. Heart size is grossly stable. Old right rib fractures. IMPRESSION: Negative for pneumothorax following thoracentesis. Extensive airspace disease and consolidation in the right lung and cannot exclude progression since prior examination. Electronically Signed   By: Markus Daft M.D.   On: 10/29/2017 10:23   Dg Shoulder Left  Result Date: 11/06/2017 CLINICAL DATA:  [place] striking the left side today. Recent distal humeral fracture. EXAM: LEFT SHOULDER - 2+ VIEW COMPARISON:  Left humerus series of Nov 17, 2009 FINDINGS: There is chronic deformity of the distal clavicle. No acute clavicular fracture is observed. The scapula and proximal humerus appear normal. There is narrowing of the glenohumeral joint space. IMPRESSION: There is no acute fracture or dislocation of the left shoulder. 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 There is chronic deformity of the distal clavicle.  Electronically Signed   By: David  Martinique M.D.   On: 10/31/2017 11:20   Dg Foot Complete Left  Result Date: 11/01/2017 CLINICAL DATA:  Left heel pain. EXAM: LEFT FOOT - COMPLETE 3+ VIEW COMPARISON:  09/21/2017. FINDINGS:  Osteopenia. Mild degenerative changes at the first metatarsophalangeal joint with hallux valgus. Single screw traverses the posterior calcaneus with adjacent bony overgrowth. No acute osseous abnormality. IMPRESSION: 1. Postoperative changes in the calcaneus without acute osseous abnormality. 2. Mild first metatarsophalangeal joint osteoarthritis. Electronically Signed   By: Lorin Picket M.D.   On: 11/01/2017 09:57     LOS: 6 days   Signature  Lala Lund M.D on 10/18/2017 at 12:04 PM  Between 7am to 7pm - Pager - [phone] ( page via [email or web address], text pages only, please mention full 10 digit call back number).  After 7pm go to [email or web address] - password [facility]

## 2017-11-03 ENCOUNTER — Encounter (HOSPITAL_COMMUNITY): Payer: Self-pay | Admitting: Emergency Medicine

## 2017-11-03 ENCOUNTER — Inpatient Hospital Stay (HOSPITAL_COMMUNITY): Payer: Medicare HMO

## 2017-11-03 DIAGNOSIS — E43 Unspecified severe protein-calorie malnutrition: Secondary | ICD-10-CM

## 2017-11-03 DIAGNOSIS — I5032 Chronic diastolic (congestive) heart failure: Secondary | ICD-10-CM

## 2017-11-03 DIAGNOSIS — I340 Nonrheumatic mitral (valve) insufficiency: Secondary | ICD-10-CM

## 2017-11-03 LAB — COMPREHENSIVE METABOLIC PANEL
ALBUMIN: 2.4 g/dL — AB (ref 3.5–5.0)
ALK PHOS: 88 U/L (ref 38–126)
ALT: 53 U/L (ref 17–63)
AST: 57 U/L — AB (ref 15–41)
Anion gap: 12 (ref 5–15)
BUN: 9 mg/dL (ref 6–20)
CALCIUM: 9.4 mg/dL (ref 8.9–10.3)
CHLORIDE: 99 mmol/L — AB (ref 101–111)
CO2: 22 mmol/L (ref 22–32)
CREATININE: 0.57 mg/dL — AB (ref 0.61–1.24)
GFR calc non Af Amer: >60 mL/min (ref >60)
GLUCOSE: 163 mg/dL — AB (ref 65–99)
Potassium: 4.8 mmol/L (ref 3.5–5.1)
SODIUM: 133 mmol/L — AB (ref 135–145)
Total Bilirubin: 0.3 mg/dL (ref 0.3–1.2)
Total Protein: 7 g/dL (ref 6.5–8.1)

## 2017-11-03 LAB — CBC
HCT: 36.1 % — ABNORMAL LOW (ref 39.0–52.0)
HEMOGLOBIN: 11.1 g/dL — AB (ref 13.0–17.0)
MCH: 25.1 pg — ABNORMAL LOW (ref 26.0–34.0)
MCHC: 30.7 g/dL (ref 30.0–36.0)
MCV: 81.5 fL (ref 78.0–100.0)
PLATELETS: 462 10*3/uL — AB (ref 150–400)
RBC: 4.43 MIL/uL (ref 4.22–5.81)
RDW: 15 % (ref 11.5–15.5)
WBC: 11.3 10*3/uL — AB (ref 4.0–10.5)

## 2017-11-03 LAB — GLUCOSE, CAPILLARY
GLUCOSE-CAPILLARY: 167 mg/dL — AB (ref 65–99)
GLUCOSE-CAPILLARY: 144 mg/dL — AB (ref 65–99)
GLUCOSE-CAPILLARY: 183 mg/dL — AB (ref 65–99)
Glucose-Capillary: 171 mg/dL — ABNORMAL HIGH (ref 65–99)

## 2017-11-03 LAB — MAGNESIUM: Magnesium: 1.8 mg/dL (ref 1.7–2.4)

## 2017-11-03 LAB — ECHOCARDIOGRAM COMPLETE
Height: 73 in
Weight: 2000 oz

## 2017-11-03 MED ORDER — SODIUM CHLORIDE 0.9 % IV BOLUS
500.0000 mL | Freq: Once | INTRAVENOUS | Status: AC
  Administered 2017-11-03: 500 mL via INTRAVENOUS

## 2017-11-03 MED ORDER — DILTIAZEM HCL-DEXTROSE 100-5 MG/100ML-% IV SOLN (PREMIX)
5.0000 mg/h | INTRAVENOUS | Status: DC
  Administered 2017-11-03 (×2): 10 mg/h via INTRAVENOUS
  Administered 2017-11-04: 5 mg/h via INTRAVENOUS
  Administered 2017-11-04: 10 mg/h via INTRAVENOUS
  Administered 2017-11-05: 7.5 mg/h via INTRAVENOUS
  Administered 2017-11-05 – 2017-11-06 (×2): 10 mg/h via INTRAVENOUS
  Filled 2017-11-03 (×9): qty 100

## 2017-11-03 MED ORDER — DIGOXIN 125 MCG PO TABS
0.1875 mg | ORAL_TABLET | Freq: Every day | ORAL | Status: DC
  Filled 2017-11-03: qty 1.5

## 2017-11-03 MED ORDER — DIGOXIN 0.25 MG/ML IJ SOLN
0.1250 mg | Freq: Three times a day (TID) | INTRAMUSCULAR | Status: AC
  Administered 2017-11-03 (×2): 0.125 mg via INTRAVENOUS
  Filled 2017-11-03 (×2): qty 0.5

## 2017-11-03 MED ORDER — DILTIAZEM HCL 100 MG IV SOLR
5.0000 mg/h | INTRAVENOUS | Status: DC
  Administered 2017-11-03: 5 mg/h via INTRAVENOUS
  Filled 2017-11-03 (×2): qty 100

## 2017-11-03 MED ORDER — DIGOXIN 0.25 MG/ML IJ SOLN
0.2500 mg | Freq: Once | INTRAMUSCULAR | Status: AC
  Administered 2017-11-03: 0.25 mg via INTRAVENOUS
  Filled 2017-11-03: qty 1

## 2017-11-03 NOTE — Progress Notes (Signed)
Patient reverted to a-fib at 180 bpm. MD notified and Cardizem gtt started. Titrated from 5mg  to 10mg /hr after 30 mins. After one hour, heart rate in 150s but blood pressure 99/56. Cardizem still running at 10mg . Will continue to monitor.

## 2017-11-03 NOTE — Progress Notes (Signed)
Physical Therapy Treatment Patient Details Name: Wyatt Salas MRN: [id number] DOB: [birth year] Today's Date: 11/03/2017    History of Present Illness pt was admitted for A Fib with RVR.  PMH:  TBI, ETOH, DM, crani.  Had L distal elbow fx last month    PT Comments    Patient remains limited with activity tolerance needing mod A for transfers and only able to pivot with walker from [facility] to recliner with increased time and HR up to 133.   Still appropriate for SNF level rehab at d/c.  Follow Up Recommendations  SNF     Equipment Recommendations  None recommended by PT    Recommendations for Other Services       Precautions / Restrictions Precautions Precautions: Fall Precaution Comments: Per Dr [name], ROM OK to tolerance, splint just removed; follow up xray pending, WBAT, monitor BP- orthostatic Other Brace/Splint: L wrist splint    Mobility  Bed Mobility               General bed mobility comments: up on BSC upon my entry  Transfers Overall transfer level: Needs assistance   Transfers: Sit to/from Stand;Stand Pivot Transfers Sit to Stand: Mod assist Stand pivot transfers: Min assist       General transfer comment: cues for hand placement, lifting help to stand, increased time, stand and step to recliner very slow and labored with fatigue, HR 133, SpO2 93% on O2  Ambulation/Gait                 Stairs             Wheelchair Mobility    Modified Rankin (Stroke Patients Only)       Balance Overall balance assessment: Needs assistance;History of Falls   Sitting balance-Leahy Scale: Fair     Standing balance support: Bilateral upper extremity supported Standing balance-Leahy Scale: Poor Standing balance comment: needs UE support for balance                            Cognition Arousal/Alertness: Awake/alert Behavior During Therapy: WFL for tasks assessed/performed Overall Cognitive Status: No family/caregiver present to  determine baseline cognitive functioning                                 General Comments: mostly wfl      Exercises      General Comments        Pertinent Vitals/Pain Pain Assessment: Faces Faces Pain Scale: Hurts even more Pain Location: L side Pain Descriptors / Indicators: Grimacing;Guarding;Sore Pain Intervention(s): Monitored during session;Repositioned    Home Living                      Prior Function            PT Goals (current goals can now be found in the care plan section) Progress towards PT goals: Progressing toward goals(slowly)    Frequency    Min 2X/week      PT Plan Current plan remains appropriate    Co-evaluation              AM-PAC PT "6 Clicks" Daily Activity  Outcome Measure  Difficulty turning over in bed (including adjusting bedclothes, sheets and blankets)?: A Little Difficulty moving from lying on back to sitting on the side of the bed? : A Little   Help  needed moving to and from a bed to chair (including a wheelchair)?: A Lot Help needed walking in hospital room?: A Lot Help needed climbing 3-5 steps with a railing? : Total 6 Click Score: 11    End of Session Equipment Utilized During Treatment: Oxygen Activity Tolerance: Patient limited by fatigue Patient left: in chair;with call bell/phone within reach;with chair alarm set   PT Visit Diagnosis: Muscle weakness (generalized) (M62.81);Other abnormalities of gait and mobility (R26.89);Unsteadiness on feet (R26.81);Difficulty in walking, not elsewhere classified (R26.2);History of falling (Z91.81)     Time: 2248-2500 PT Time Calculation (min) (ACUTE ONLY): 15 min  Charges:  $Therapeutic Activity: 8-22 mins                    G CodesMagda Kiel, [place] [phone] 11/03/2017    [name] 11/03/2017, 12:36 PM

## 2017-11-03 NOTE — Progress Notes (Signed)
  Echocardiogram 2D Echocardiogram has been performed.  Darlina Sicilian M 11/03/2017, 2:06 PM

## 2017-11-03 NOTE — Progress Notes (Signed)
Daily Progress Note   Patient Name: Wyatt Salas       Date: 11/03/2017 DOB: 1951/05/06  Age: 67 y.o. MRN#: [id number] Attending Physician: Mariel Aloe, MD Primary Care Physician: [name], MD Admit Date: 10/31/2017  Reason for Consultation/Follow-up: Establishing goals of care  Subjective: Mr. Symanski denies complaints, he has been told of his lung biopsy results, he is asking for a plan to treat and cure him, he is asking to speak with oncology, see below  Length of Stay: 7  Current Medications: Scheduled Meds:  . aspirin  81 mg Oral Daily  . atorvastatin  40 mg Oral q1800  . digoxin  0.125 mg Intravenous Q8H  . [START ON 11/04/2017] digoxin  0.1875 mg Oral Daily  . docusate sodium  200 mg Oral BID  . enoxaparin (LOVENOX) injection  40 mg Subcutaneous Q24H  . feeding supplement (ENSURE ENLIVE)  237 mL Oral BID BM  . folic acid  1 mg Oral Daily  . insulin aspart  0-5 Units Subcutaneous QHS  . insulin aspart  0-9 Units Subcutaneous TID WC  . lacosamide  100 mg Oral BID  . levETIRAcetam  1,500 mg Oral BID  . metoprolol tartrate  25 mg Oral BID  . multivitamin with minerals  1 tablet Oral Daily  . phenytoin  30 mg Oral BID  . sodium chloride flush  3 mL Intravenous Q12H  . tamsulosin  0.4 mg Oral Daily  . thiamine  100 mg Oral Daily    Continuous Infusions: . diltiazem (CARDIZEM) infusion 10 mg/hr (11/03/17 1150)    PRN Meds: acetaminophen **OR** acetaminophen, diltiazem, guaiFENesin-dextromethorphan, HYDROcodone-acetaminophen, ondansetron **OR** ondansetron (ZOFRAN) IV, phenol, polyethylene glycol, polyvinyl alcohol  Physical Exam         General: Alert, awake, in no acute distress. Cachectic and chronically ill appearing HEENT: No bruits, no goiter, no JVD,  poor dentition Heart: Regular rate.. No murmur appreciated. Lungs: Dminished, esp RLL Abdomen: Soft, nontender, nondistended, positive bowel sounds.  Ext: No significant edema Skin: Warm and dry Neuro: Grossly intact, nonfocal. Thin and weak appearing Dry lips Doesn't communicate much   Vital Signs: BP (!) 114/94 (BP Location: Right Arm)   Pulse 97   Temp 98.3 F (36.8 C)   Resp (!) 24   Ht 6\' 1"  (1.854 m)  Wt 56.7 kg (125 lb)   SpO2 100%   BMI 16.49 kg/m  SpO2: SpO2: 100 % O2 Device: O2 Device: Nasal Cannula O2 Flow Rate: O2 Flow Rate (L/min): 2 L/min  Intake/output summary:   Intake/Output Summary (Last 24 hours) at 11/03/2017 1459 Last data filed at 11/03/2017 0700 Gross per 24 hour  Intake 393.44 ml  Output 775 ml  Net -381.56 ml   LBM: Last BM Date: 11/01/17 Baseline Weight: Weight: 68 kg (150 lb) Most recent weight: Weight: 56.7 kg (125 lb)       Palliative Assessment/Data:    Flowsheet Rows     Most Recent Value  Intake Tab  Referral Department  Hospitalist  Unit at Time of Referral  Med/Surg Unit  Palliative Care Primary Diagnosis  Cancer  Date Notified  10/28/17  Palliative Care Type  New Palliative care  Reason for referral  Clarify Goals of Care  Date of Admission  11/10/2017  Date first seen by Palliative Care  10/29/17  # of days Palliative referral response time  1 Day(s)  # of days IP prior to Palliative referral  2  Clinical Assessment  Palliative Performance Scale Score  60%  Pain Max last 24 hours  6  Pain Min Last 24 hours  0  Psychosocial & Spiritual Assessment  Palliative Care Outcomes  Patient/Family meeting held?  Yes  Who was at the meeting?  patient  Palliative Care Outcomes  Clarified goals of care      Patient Active Problem List   Diagnosis Date Noted  . Protein-calorie malnutrition, severe 10/28/2017  . Acute urinary retention 10/27/2017  . Atrial fibrillation with RVR ([place]) 10/27/2017  . Chronic diastolic CHF  (congestive heart failure) ([place]) 10/27/2017  . Closed fracture of distal end of left humerus 10/27/2017  . Mass of upper lobe of right lung   . New onset atrial fibrillation ([place])   . Abnormal liver function   . Lung mass   . Essential hypertension   . Altered mental status 05/18/2017  . Right sided weakness 10/14/2016  . Acute urinary tract infection 10/14/2016  . Elevated troponin 10/14/2016  . Type 2 diabetes mellitus with hyperlipidemia ([place]) 10/14/2016  . Fall at home--mechanical 01/26/2014  . Elevated CK 01/26/2014  . Seizure disorder ([place]) 07/26/2013  . NSTEMI (non-ST elevated myocardial infarction) ([place]) 07/26/2013  . Hyperlipidemia 07/26/2013  . Aspiration pneumonia ([place]) 07/26/2013  . Rhabdomyolysis 07/26/2013    Palliative Care Assessment & Plan   Patient Profile: 67 y.o. 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 fib with RVR.  Await results of cytology for decision regarding possible need for bronchoscopy.  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 Patient continues to desire full code/full scope. He becomes angry when further discussions were undertaken. He remains non-forthcoming. He has been seen by cardiology today. PMT to continue to follow along but agree with skilled nursing facility placement towards the end of this hospitalization at least for rehabilitation attempt and recommend palliative services follow the patient at skilled nursing facility as well.    Goals of Care and Additional Recommendations:  Limitations on Scope of Treatment: Full Scope  Treatment  Code  Status:    Code Status Orders  (From admission, onward)        Start     Ordered   10/27/17 0548  Full code  Continuous     10/27/17 0549    Code Status History    Date Active Date Inactive Code Status Order ID Comments User Context   05/18/2017 2330 05/24/2017 2201 Full Code [id number]  Jani Gravel, MD Inpatient   10/14/2016 2118 10/15/2016 1921 Full Code [id number]  Toy Baker, MD Inpatient   01/25/2014 2010 01/26/2014 2035 Full Code [id number]  Corky Sox, MD Inpatient   07/27/2013 0019 07/31/2013 1533 Full Code [id number]  Orvan Falconer, MD Inpatient       Prognosis:   Unable to determine  Discharge Planning:  Recommend skilled nursing facility rehabilitation with palliative services  Care plan was discussed with Patient  Thank you for allowing the Palliative Medicine Team to assist in the care of this patient.   Total Time 25 Prolonged Time Billed  no       Greater than 50%  of this time was spent counseling and coordinating care related to the above assessment and plan.  Loistine Chance, MD [phone]  Please contact Palliative Medicine Team phone at [phone] for questions and concerns.

## 2017-11-03 NOTE — Progress Notes (Signed)
Pt selects [facility] for SNF- planning for SNF for short term rehab at [facility] initiated [facility] insurance authorization. Will continue following.   Sharren Bridge, MSW, LCSW Clinical Social Work 11/03/2017 [phone]

## 2017-11-03 NOTE — Progress Notes (Signed)
PROGRESS NOTE    Wyatt Salas  JYN:[id number] DOB: 03/29/1951 DOA: 11/08/2017 PCP: [name], MD   Brief Narrative: Wyatt Salas is a 67 y.o. male with a history of alcohol use, seizure disorder, DM-2, hypertension, known lung mass. He presented secondary to a fall, found to be in atrial fibrillation with RVR. 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 Biopsy pending. Plan for SNF on discharge.   Assessment & Plan:   Principal Problem:   Atrial fibrillation with RVR (HCC) Active Problems:   Seizure disorder (HCC)   Type 2 diabetes mellitus with hyperlipidemia (HCC)   Lung mass   Essential hypertension   Acute urinary retention   Chronic diastolic CHF (congestive heart failure) (HCC)   Closed fracture of distal end of left humerus   Protein-calorie malnutrition, severe   Atrial fibrillation w/ RVR Intermittent worsening of RVR. On metoprolol and started on Cardizem drip. Given digoxin overnight with some improvement. Asymptomatic. CHA2DS2-VASc Score is 2. -Cardiology consult -Continue metoprolol and Cardizem drip  Non-small cell carcinoma Patient is s/p bronchoscopy with bronchial brushing significant for non-small cell carcinoma. -Biopsy pending -Discussed with Oncology, [name], who will arrange for either her or [name] to see patient as an outpatient. -Palliative care medicine recommendations: outpatient palliative care  Essential hypertension Well controlled and slightly low at times. -Continue metoprolol. Diltiazem drip titrating.  Dysphagia -dysphagia 3 diet per speech therapy recommendations  Left humerus fracture Secondary to fall. Outpatient follow-up with Dr. [name].  History of alcohol use Out of window for DTs  Deconditioning Frequent falls -PT eval: SNF  Left ankle pain Likely arthritic. Symptomatic management.  Acute urinary retention Continue Flomax. Foley placed on  4/13. Plan for outpatient urology follow-up.  Diabetes mellitus, type 2 -Continue SSI  Seizure disorder -Continue Keppra and Dilantin    DVT prophylaxis: Lovenox Code Status:   Code Status: Full Code Family Communication: HCPOA Disposition Plan: Discharge to SNF when medically ready   Consultants:   Pulmonology  Cardiology  Palliative care medicine  Oncology (telepone)  Procedures:   Bronchoscopy (10/19/2017)  Antimicrobials:  None    Subjective: No concerns today.  Objective: Vitals:   11/03/17 0532 11/03/17 0630 11/03/17 0659 11/03/17 1146  BP: 96/62 92/74 93/72  (!) 114/94  Pulse: (!) 141 89  97  Resp:  20  (!) 24  Temp:    98.3 F (36.8 C)  TempSrc:      SpO2:  96%  100%  Weight:      Height:        Intake/Output Summary (Last 24 hours) at 11/03/2017 1518 Last data filed at 11/03/2017 0700 Gross per 24 hour  Intake 393.44 ml  Output 775 ml  Net -381.56 ml   Filed Weights   10/31/17 0357 11/01/17 0608 11/13/2017 0646  Weight: 57.6 kg (126 lb 15.8 oz) 57.1 kg (125 lb 14.1 oz) 56.7 kg (125 lb)    Examination:  General exam: Appears calm and comfortable Respiratory system: Clear to auscultation. Respiratory effort normal. Cardiovascular system: S1 & S2 heard, Irregular rhythm, normal rate. No murmurs, rubs, gallops or clicks. Gastrointestinal system: Abdomen is nondistended, soft and nontender. No organomegaly or masses felt. Normal bowel sounds heard. Central nervous system: Alert and oriented. No focal neurological deficits. Extremities: No edema. No calf tenderness Skin: No cyanosis. No rashes Psychiatry: Judgement and insight appear normal. Anxious    Data Reviewed: I have personally reviewed following labs and imaging studies  CBC: Recent Labs  Lab 10/28/17 0319 10/29/17 0426 10/31/17 0422 11/01/17 0506 11/03/17 0425  WBC 9.5 9.1 9.7 10.4 11.3*  HGB 11.1* 10.8* 11.3* 10.6* 11.1*  HCT 36.2* 34.8* 36.2* 33.5* 36.1*  MCV 83.4 82.3  82.3 81.9 81.5  PLT 370 346 328 384 588*   Basic Metabolic Panel: Recent Labs  Lab 10/28/17 0319 10/29/17 0426 10/31/17 0422 11/01/17 0506 11/03/17 0425  NA 133* 135 132* 133* 133*  K 2.9* 3.8 4.6 4.5 4.8  CL 98* 101 98* 100* 99*  CO2 22 25 24 23 22   GLUCOSE 258* 138* 162* 170* 163*  BUN 10 8 10 9 9   CREATININE 0.62 0.54* 0.56* 0.53* 0.57*  CALCIUM 8.8* 9.0 9.0 9.1 9.4  MG 1.7 1.9 1.7 1.7 1.8   GFR: Estimated Creatinine Clearance: 70.9 mL/min (A) (by C-G formula based on SCr of 0.57 mg/dL (L)). Liver Function Tests: Recent Labs  Lab 10/28/17 0319 10/29/17 1046 10/31/17 0422 11/03/17 0425  AST 40  --  38 57*  ALT 28  --  33 53  ALKPHOS 69  --  75 88  BILITOT 0.5  --  0.2* 0.3  PROT 6.1* 5.9* 6.0* 7.0  ALBUMIN 2.5*  --  2.3* 2.4*   No results for input(s): LIPASE, AMYLASE in the last 168 hours. No results for input(s): AMMONIA in the last 168 hours. Coagulation Profile: No results for input(s): INR, PROTIME in the last 168 hours. Cardiac Enzymes: No results for input(s): CKTOTAL, CKMB, CKMBINDEX, TROPONINI in the last 168 hours. BNP (last 3 results) No results for input(s): PROBNP in the last 8760 hours. HbA1C: No results for input(s): HGBA1C in the last 72 hours. CBG: Recent Labs  Lab 11/10/2017 1023 10/25/2017 1713 11/04/2017 2129 11/03/17 0752 11/03/17 1143  GLUCAP 195* 133* 147* 167* 171*   Lipid Profile: No results for input(s): CHOL, HDL, LDLCALC, TRIG, CHOLHDL, LDLDIRECT in the last 72 hours. Thyroid Function Tests: No results for input(s): TSH, T4TOTAL, FREET4, T3FREE, THYROIDAB in the last 72 hours. Anemia Panel: No results for input(s): VITAMINB12, FOLATE, FERRITIN, TIBC, IRON, RETICCTPCT in the last 72 hours. Sepsis Labs: No results for input(s): PROCALCITON, LATICACIDVEN in the last 168 hours.  Recent Results (from the past 240 hour(s))  Urine culture     Status: Abnormal   Collection Time: 11/12/2017 11:19 PM  Result Value Ref Range Status    Specimen Description   Final    URINE, CATHETERIZED Performed at [facility] [address], [place], [place] [address]    Special Requests   Final    NONE Performed at [facility], [place] [address], [place], [place] [address]    Culture (A)  Final    <10,000 COLONIES/mL INSIGNIFICANT GROWTH Performed at [place] [facility] [address], [place], [place] [address]    Report Status 10/28/2017 FINAL  Final  MRSA PCR Screening     Status: None   Collection Time: 10/27/17  9:21 AM  Result Value Ref Range Status   MRSA by PCR NEGATIVE NEGATIVE Final    Comment:        The GeneXpert MRSA Assay (FDA approved for NASAL specimens only), is one component of a comprehensive MRSA colonization surveillance program. It is not intended to diagnose MRSA infection nor to guide or monitor treatment for MRSA infections. Performed at [facility], [place] [address] [place], [place] [address]   Culture, sputum-assessment     Status: None   Collection  Time: 10/27/17  3:30 PM  Result Value Ref Range Status   Specimen Description EXPECTORATED SPUTUM  Final   Special Requests NONE  Final   Sputum evaluation   Final    THIS SPECIMEN IS ACCEPTABLE FOR SPUTUM CULTURE Performed at [facility], [place] [address], [place], [place] [address]    Report Status 10/27/2017 FINAL  Final  Culture, respiratory (NON-Expectorated)     Status: None   Collection Time: 10/27/17  3:30 PM  Result Value Ref Range Status   Specimen Description   Final    EXPECTORATED SPUTUM Performed at [facility], [place] [address] [place], [place] [address]    Special Requests   Final    NONE Reflexed from [id number] Performed at [facility], [place] [address] [place], [place] [address]    Gram Stain   Final    FEW WBC PRESENT,BOTH PMN AND MONONUCLEAR MODERATE GRAM POSITIVE COCCI    Culture   Final    Consistent with  normal respiratory flora. Performed at [facility], [place] [address] [place], [place] [address]    Report Status 10/30/2017 FINAL  Final  Body fluid culture     Status: None   Collection Time: 10/29/17 10:26 AM  Result Value Ref Range Status   Specimen Description   Final    PLEURAL RIGHT Performed at [place] [address] [place], [place] [address]    Special Requests   Final    NONE Performed at [facility], [place] [address], [place], [place] [address]    Gram Stain   Final    FEW WBC PRESENT, PREDOMINANTLY MONONUCLEAR NO ORGANISMS SEEN    Culture   Final    NO GROWTH 3 DAYS Performed at [facility] [address], [place], [place] [address]    Report Status 11/01/2017 FINAL  Final         Radiology Studies: Dg Swallowing Func-speech Pathology  Result Date: 11/03/2017 Objective Swallowing Evaluation: Type of Study: MBS-Modified Barium Swallow Study  Patient Details Name: OSWIN GRIFFITH MRN: [id number] Date of Birth: 09-10-1950 Today's Date: 11/03/2017 Time: SLP Start Time (ACUTE ONLY): 0910 -SLP Stop Time (ACUTE ONLY): 0930 SLP Time Calculation (min) (ACUTE ONLY): 20 min Past Medical History: Past Medical History: Diagnosis Date . Diabetes mellitus without complication ([place])   Type II . Diastolic dysfunction   a. ECHO 07/2013 EF 55-60% and grade 1 diastolic dysfunction . NSTEMI (non-ST elevated myocardial infarction) ([place])   problem list 01/2014 . Rhabdomyolysis 01/2014 . Seizures ([place])  . Syncope 01/2014 . Traumatic brain injury [facility])  Past Surgical History: Past Surgical History: Procedure Laterality Date . CRANIOTOMY   . VIDEO BRONCHOSCOPY Bilateral 11/09/2017  Procedure: VIDEO BRONCHOSCOPY WITH FLUORO;  Surgeon: Collene Gobble, MD;  Location: [facility];  Service: Cardiopulmonary;  Laterality: Bilateral; HPI: 67 yo male adm to [facility] with AMS - Pt found to have right lung mass, TBI, SDH, ETOH use, seizures, essentially  wheelchair bound - s/p falls with left humerus fx and ankle injury.  Pt is s/p thoracentesis 10/29/17 and bronchoscopy 11/01/2017 with findings abnormal cells.  Swallow eval ordered.   Imaging studies showed right cerebral encephalomalacia and severe cerebellar atrophy.   Pt underwent BSE and was found to be high aspiration risk, MBS ordered.   Subjective: pt awake in chair Assessment / Plan / Recommendation CHL IP CLINICAL IMPRESSIONS 11/03/2017 Clinical Impression Pt presents with mild oral dysphagia  and intact pharyngeal swallow ability.  NO aspiration or penetration noted during MBS.  Pt demonstrates extended breath hold independently with swallowing liquids.  Difficulty masticating and transiting solid was noted - likely due to dry bolus, xerostomia and poor dentition.  He did not cough during entire MBS however and thus will follow up briefly to assure tolerance.  SLP Visit Diagnosis Dysphagia, oral phase (R13.11) Attention and concentration deficit following -- Frontal lobe and executive function deficit following -- Impact on safety and function Mild aspiration risk   CHL IP TREATMENT RECOMMENDATION 11/03/2017 Treatment Recommendations Therapy as outlined in treatment plan below   Prognosis 11/03/2017 Prognosis for Safe Diet Advancement Good Barriers to Reach Goals -- Barriers/Prognosis Comment -- CHL IP DIET RECOMMENDATION 11/03/2017 SLP Diet Recommendations Dysphagia 3 (Mech soft) solids;Thin liquid Liquid Administration via Straw Medication Administration Other (Comment) Compensations Minimize environmental distractions;Slow rate;Small sips/bites Postural Changes Remain semi-upright after after feeds/meals (Comment);Seated upright at 90 degrees   CHL IP OTHER RECOMMENDATIONS 11/03/2017 Recommended Consults -- Oral Care Recommendations Oral care QID Other Recommendations --   CHL IP FOLLOW UP RECOMMENDATIONS 10/29/2017 Follow up Recommendations Other (comment)   CHL IP FREQUENCY AND DURATION 11/03/2017 Speech Therapy  Frequency (ACUTE ONLY) min 1 x/week Treatment Duration 1 week      CHL IP ORAL PHASE 11/03/2017 Oral Phase Impaired Oral - Pudding Teaspoon -- Oral - Pudding Cup -- Oral - Honey Teaspoon -- Oral - Honey Cup -- Oral - Nectar Teaspoon WFL Oral - Nectar Cup -- Oral - Nectar Straw -- Oral - Thin Teaspoon WFL Oral - Thin Cup -- Oral - Thin Straw WFL;Piecemeal swallowing Oral - Puree WFL;Piecemeal swallowing Oral - Mech Soft -- Oral - Regular Impaired mastication;Weak lingual manipulation;Piecemeal swallowing;Delayed oral transit Oral - Multi-Consistency -- Oral - Pill WFL Oral Phase - Comment --  CHL IP PHARYNGEAL PHASE 11/03/2017 Pharyngeal Phase WFL Pharyngeal- Pudding Teaspoon -- Pharyngeal -- Pharyngeal- Pudding Cup -- Pharyngeal -- Pharyngeal- Honey Teaspoon -- Pharyngeal -- Pharyngeal- Honey Cup -- Pharyngeal -- Pharyngeal- Nectar Teaspoon -- Pharyngeal -- Pharyngeal- Nectar Cup -- Pharyngeal -- Pharyngeal- Nectar Straw -- Pharyngeal -- Pharyngeal- Thin Teaspoon -- Pharyngeal -- Pharyngeal- Thin Cup -- Pharyngeal -- Pharyngeal- Thin Straw -- Pharyngeal -- Pharyngeal- Puree -- Pharyngeal -- Pharyngeal- Mechanical Soft -- Pharyngeal -- Pharyngeal- Regular -- Pharyngeal -- Pharyngeal- Multi-consistency -- Pharyngeal -- Pharyngeal- Pill -- Pharyngeal -- Pharyngeal Comment --  CHL IP CERVICAL ESOPHAGEAL PHASE 11/03/2017 Cervical Esophageal Phase WFL Pudding Teaspoon -- Pudding Cup -- Honey Teaspoon -- Honey Cup -- Nectar Teaspoon -- Nectar Cup -- Nectar Straw -- Thin Teaspoon -- Thin Cup -- Thin Straw -- Puree -- Mechanical Soft -- Regular -- Multi-consistency -- Pill -- Cervical Esophageal Comment -- No flowsheet data found. 11/03/2017, 9:49 AM Luanna Salk, MS [facility] SLP [phone]                   Scheduled Meds: . aspirin  81 mg Oral Daily  . atorvastatin  40 mg Oral q1800  . digoxin  0.125 mg Intravenous Q8H  . [START ON 11/04/2017] digoxin  0.1875 mg Oral Daily  . docusate sodium  200 mg Oral BID  .  enoxaparin (LOVENOX) injection  40 mg Subcutaneous Q24H  . feeding supplement (ENSURE ENLIVE)  237 mL Oral BID BM  . folic acid  1 mg Oral Daily  . insulin aspart  0-5 Units Subcutaneous QHS  . insulin aspart  0-9 Units Subcutaneous TID WC  . lacosamide  100 mg  Oral BID  . levETIRAcetam  1,500 mg Oral BID  . metoprolol tartrate  25 mg Oral BID  . multivitamin with minerals  1 tablet Oral Daily  . phenytoin  30 mg Oral BID  . sodium chloride flush  3 mL Intravenous Q12H  . tamsulosin  0.4 mg Oral Daily  . thiamine  100 mg Oral Daily   Continuous Infusions: . diltiazem (CARDIZEM) infusion 10 mg/hr (11/03/17 1150)     LOS: 7 days     Cordelia Poche, MD [facility] 11/03/2017, 3:18 PM Pager: [phone]  If 7PM-7AM, please contact night-coverage [email or web address] Password [facility] 11/03/2017, 3:18 PM

## 2017-11-03 NOTE — Consult Note (Signed)
Cardiology Consultation:   Patient ID: Wyatt Salas; [id number]; [birth year]   Admit date: 11/01/2017 Date of Consult: 11/03/2017  Primary Care Provider: [name], MD Primary Cardiologist: Glenetta Hew, MD (pt lives in [place]) Primary Electrophysiologist:     Patient Profile:   Wyatt Salas is a 67 y.o. male with a hx of high fall risk with multiple falls in the past 2 months with multiple fractures, wheel chair dependent, remote hx of subdural hematoma s/p right sided craniotomy (?2000), seizure disorder, alcohol abuse, chronic hep C, HTN, HDL, chronic diastolic heart failure, and lung mass concerning for cancer who is being seen today for the evaluation of Afib RVR at the request of Dr. Lonny Prude.  History of Present Illness:   [name] has been seen for three falls in the past 2 months. On 2/19 he injured his ankle transferring from chair to bed and instructed to follow up with ortho. On 3/5 he injured his elbow and instructed to follow up with ortho. On 4/10 he hit his head and left shoulder. During his workup, CT chest was concerning for increase in right upper lobe mass and increase in soft tissue prominence at the mediastinum and right hilum concerning for metastatic adenopathy. He was also noted to be in Afib RVR in the 150s, which appears to be his first documented occurrence. afib was treated with diltiazem drip. Cardiology opted for rate control with diltiazem and beta blockers instead of rhythm control. He is not a candidate for oral anticoagulation given his multiple falls and hx of subdural hematoma. Cardiology signed off on 10/27/17. He was since transitioned off cardizem drip and was rate controlled with lopressor.  He remains in the hospital. Pulmonary critical care were consulted for lung mass. Thoracentesis was performed for diagnosis and revealed atypical cells and he underwent bronchoscopy on 10/31/2017 - awaiting biopsy results.   Patient has poor insight about his  condition and has a record of noncompliance. Palliative care has been consulted for goals of care.   On 11/01/17, he developed RVR again. PRN IV cardizem and IV digoxin x 2 were given for rate control. Cardiology was asked to re-engage for rate control. He is currently on cardizem drip at 10 mg/hr. HR now in the 90-100 and appears to be flutter. Pressure has been marginal in the 90s. Also on lopressor 25 mg BID.    He denies palpitations, dizziness, shortness of breath, and lower extremity edema. He reports left-sided chest pain since yesterday, but details are unclear. Patient is a poor historian.   Patient's power of attorney was at bedside.  Past Medical History:  Diagnosis Date  . Diabetes mellitus without complication ([place])    Type II  . Diastolic dysfunction    a. ECHO 07/2013 EF 55-60% and grade 1 diastolic dysfunction  . NSTEMI (non-ST elevated myocardial infarction) ([place])    problem list 01/2014  . Rhabdomyolysis 01/2014  . Seizures ([place])   . Syncope 01/2014  . Traumatic brain injury [facility])     Past Surgical History:  Procedure Laterality Date  . CRANIOTOMY    . VIDEO BRONCHOSCOPY Bilateral 11/16/2017   Procedure: VIDEO BRONCHOSCOPY WITH FLUORO;  Surgeon: Collene Gobble, MD;  Location: [facility];  Service: Cardiopulmonary;  Laterality: Bilateral;     Home Medications:  Prior to Admission medications   Medication Sig Start Date End Date Taking? Authorizing Provider  acetaminophen (TYLENOL) 500 MG tablet Take 1 tablet (500 mg total) by mouth every 6 (six) hours as  needed. Patient taking differently: Take 500 mg by mouth 2 (two) times daily.  10/05/17  Yes Burky, Lanelle Bal B, NP  docusate sodium (COLACE) 100 MG capsule Take 200 mg by mouth 2 (two) times daily.   Yes [provider]  ENSURE PLUS (ENSURE PLUS) LIQD Take 237 mLs by mouth 2 (two) times daily between meals.   Yes [provider]  folic acid (FOLVITE) 1 MG tablet Take 1 tablet (1 mg total) daily by  mouth. 05/25/17  Yes Barton Dubois, MD  lacosamide (VIMPAT) 50 MG TABS tablet Take 100 mg by mouth 2 (two) times daily.   Yes [provider]  levETIRAcetam (KEPPRA) 750 MG tablet Take 1,500 mg by mouth 2 (two) times daily.   Yes [provider]  lisinopril (PRINIVIL,ZESTRIL) 10 MG tablet Take 10 mg by mouth 2 (two) times daily.   Yes [provider]  metFORMIN (GLUCOPHAGE) 500 MG tablet Take 500 mg by mouth daily with breakfast.    Yes [provider]  Multiple Vitamin (MULTIVITAMIN WITH MINERALS) TABS tablet Take 1 tablet by mouth daily.   Yes [provider]  phenytoin (DILANTIN) 30 MG ER capsule Take 1 capsule (30 mg total) by mouth 2 (two) times daily. 10/15/16 05/27/18 Yes Arrien, [name], MD  simvastatin (ZOCOR) 80 MG tablet Take 80 mg at bedtime by mouth.    Yes [provider]  thiamine 100 MG tablet Take 1 tablet (100 mg total) daily by mouth. 05/25/17  Yes Barton Dubois, MD  carboxymethylcellulose (REFRESH PLUS) 0.5 % SOLN Place 1 drop into both eyes 2 (two) times daily as needed (Dry eyes).    [provider]  galantamine (RAZADYNE) 8 MG tablet Take 1 tablet (8 mg total) by mouth 2 (two) times daily with a meal. Patient not taking: Reported on 10/28/2017 07/31/13   [name], MD  HYDROcodone-acetaminophen (NORCO) 5-325 MG tablet Take 1 tablet by mouth every 6 (six) hours as needed for severe pain. Patient not taking: Reported on 11/08/2017 09/13/17   Julianne Rice, MD    Inpatient Medications: Scheduled Meds: . aspirin  81 mg Oral Daily  . atorvastatin  40 mg Oral q1800  . docusate sodium  200 mg Oral BID  . enoxaparin (LOVENOX) injection  40 mg Subcutaneous Q24H  . feeding supplement (ENSURE ENLIVE)  237 mL Oral BID BM  . folic acid  1 mg Oral Daily  . insulin aspart  0-5 Units Subcutaneous QHS  . insulin aspart  0-9 Units Subcutaneous TID WC  . lacosamide  100 mg Oral BID  . levETIRAcetam  1,500 mg Oral BID   . metoprolol tartrate  25 mg Oral BID  . multivitamin with minerals  1 tablet Oral Daily  . phenytoin  30 mg Oral BID  . sodium chloride flush  3 mL Intravenous Q12H  . tamsulosin  0.4 mg Oral Daily  . thiamine  100 mg Oral Daily   Continuous Infusions: . diltiazem (CARDIZEM) infusion 10 mg/hr (11/03/17 0347)   PRN Meds: acetaminophen **OR** acetaminophen, diltiazem, guaiFENesin-dextromethorphan, HYDROcodone-acetaminophen, ondansetron **OR** ondansetron (ZOFRAN) IV, phenol, polyethylene glycol, polyvinyl alcohol  Allergies:    Allergies  Allergen Reactions  . Shellfish Allergy Rash    Social History:   Social History   Socioeconomic History  . Marital status: Divorced    Spouse name: Not on file  . Number of children: Not on file  . Years of education: Not on file  . Highest education level: Not on file  Occupational History  . Not on file  Social Needs  . Financial resource strain: Not on file  . Food insecurity:    Worry: Not on file    Inability: Not on file  . Transportation needs:    Medical: Not on file    Non-medical: Not on file  Tobacco Use  . Smoking status: Former Smoker    Types: Cigars  . Smokeless tobacco: Never Used  Substance and Sexual Activity  . Alcohol use: No    Frequency: Never  . Drug use: Yes    Types: Marijuana  . Sexual activity: Not on file  Lifestyle  . Physical activity:    Days per week: Not on file    Minutes per session: Not on file  . Stress: Not on file  Relationships  . Social connections:    Talks on phone: Not on file    Gets together: Not on file    Attends religious service: Not on file    Active member of club or organization: Not on file    Attends meetings of clubs or organizations: Not on file    Relationship status: Not on file  . Intimate partner violence:    Fear of current or ex partner: Not on file    Emotionally abused: Not on file    Physically abused: Not on file    Forced sexual activity: Not on  file  Other Topics Concern  . Not on file  Social History Narrative  . Not on file    Family History:    Family History  Problem Relation Age of Onset  . Headache Mother   . Diabetes Neg Hx   . Stroke Neg Hx      ROS:  Please see the history of present illness.   All other ROS reviewed and negative.     Physical Exam/Data:   Vitals:   11/03/17 0514 11/03/17 0532 11/03/17 0630 11/03/17 0659  BP: 128/68 96/62 92/74  93/72  Pulse: (!) 130 (!) 141 89   Resp:   20   Temp:      TempSrc:      SpO2:   96%   Weight:      Height:        Intake/Output Summary (Last 24 hours) at 11/03/2017 0917 Last data filed at 11/03/2017 0700 Gross per 24 hour  Intake 493.44 ml  Output 775 ml  Net -281.56 ml   Filed Weights   10/31/17 0357 11/01/17 0608 11/08/2017 0646  Weight: 126 lb 15.8 oz (57.6 kg) 125 lb 14.1 oz (57.1 kg) 125 lb (56.7 kg)   Body mass index is 16.49 kg/m.  General:  Frail elderly appearing male HEENT: normal Neck: no JVD Vascular: No carotid bruits  Cardiac:  Irregular rhythm and rate, no murmur Lungs:  respirations unlabored Abd: soft, nontender, no hepatomegaly  Ext: no edema Musculoskeletal:  No deformities, BUE and BLE strength normal and equal Skin: warm and dry  Neuro:  CNs 2-12 intact, no focal abnormalities noted Psych:  Normal affect   EKG:  The EKG was personally reviewed and demonstrates:  Afib with RVR Telemetry:  Telemetry was personally reviewed and demonstrates:  Afib/flutter 90-120s  Relevant CV Studies:  Echo 10/15/17: Study Conclusions - Left ventricle: The cavity size was normal. Wall thickness was   normal. Systolic function was normal. The estimated ejection   fraction was in the range of 50% to 55%. Wall motion was normal;   there were no regional wall  motion abnormalities. Features are   consistent with a pseudonormal left ventricular filling pattern,   with concomitant abnormal relaxation and increased filling   pressure (grade 2  diastolic dysfunction).   Laboratory Data:  Chemistry Recent Labs  Lab 10/31/17 0422 11/01/17 0506 11/03/17 0425  NA 132* 133* 133*  K 4.6 4.5 4.8  CL 98* 100* 99*  CO2 24 23 22   GLUCOSE 162* 170* 163*  BUN 10 9 9   CREATININE 0.56* 0.53* 0.57*  CALCIUM 9.0 9.1 9.4  GFRNONAA >60 >60 >60  GFRAA >60 >60 >60  ANIONGAP 10 10 12     Recent Labs  Lab 10/28/17 0319 10/29/17 1046 10/31/17 0422 11/03/17 0425  PROT 6.1* 5.9* 6.0* 7.0  ALBUMIN 2.5*  --  2.3* 2.4*  AST 40  --  38 57*  ALT 28  --  33 53  ALKPHOS 69  --  75 88  BILITOT 0.5  --  0.2* 0.3   Hematology Recent Labs  Lab 10/31/17 0422 11/01/17 0506 11/03/17 0425  WBC 9.7 10.4 11.3*  RBC 4.40 4.09* 4.43  HGB 11.3* 10.6* 11.1*  HCT 36.2* 33.5* 36.1*  MCV 82.3 81.9 81.5  MCH 25.7* 25.9* 25.1*  MCHC 31.2 31.6 30.7  RDW 15.2 15.2 15.0  PLT 328 384 462*   Cardiac EnzymesNo results for input(s): TROPONINI in the last 168 hours. No results for input(s): TROPIPOC in the last 168 hours.  BNPNo results for input(s): BNP, PROBNP in the last 168 hours.  DDimer No results for input(s): DDIMER in the last 168 hours.  Radiology/Studies:  Dg Chest Port 1 View  Result Date: 11/01/2017 CLINICAL DATA:  Shortness of breath EXAM: PORTABLE CHEST 1 VIEW COMPARISON:  10/29/2017 FINDINGS: Cardiac shadow is stable. The left lung remains clear. Old rib fractures on the left are noted. Large skin fold is noted over the left mid lung. Persistent increased density is noted in the right upper lobe consistent with the known underlying mass lesion. Patchy infiltrative changes are noted in the right middle lobe as well. Old rib fractures on the right are seen. IMPRESSION: Stable right upper lobe mass lesion with associated patchy infiltrates similar to that noted on prior CT examination. No large pleural effusion is seen. Electronically Signed   By: Inez Catalina M.D.   On: 11/01/2017 09:53   Dg Foot Complete Left  Result Date:  11/01/2017 CLINICAL DATA:  Left heel pain. EXAM: LEFT FOOT - COMPLETE 3+ VIEW COMPARISON:  09/21/2017. FINDINGS: Osteopenia. Mild degenerative changes at the first metatarsophalangeal joint with hallux valgus. Single screw traverses the posterior calcaneus with adjacent bony overgrowth. No acute osseous abnormality. IMPRESSION: 1. Postoperative changes in the calcaneus without acute osseous abnormality. 2. Mild first metatarsophalangeal joint osteoarthritis. Electronically Signed   By: Lorin Picket M.D.   On: 11/01/2017 09:57    Assessment and Plan:   1. Atrial flutter/fibrillation with RVR - pt converted to/fib.flutter RVR in the 190s at 0029 overnight, with reduction of heart rate to 140s at 0030. He remained in the 140s and received 10 mg IV diltiazem at 0115, which did not alter his rate. Diltiazem drip was started and titrated to 10 mg/hr, limited by hypotension.  IV digoxin was given at 0654. HR is currently in the 90-120s. Will add 0.25 mg PO digoxin to his current regimen. Will avoid amiodarone for now given his lung mass. Pt is tolerating the rate and rhythm well. He does complain of nonspecific chest pain that started yesterday, unclear if this  is related to his RVR. Will continue to focus efforts on rate control.   This patients CHA2DS2-VASc Score and unadjusted Ischemic Stroke Rate (% per year) is equal to 3.2 % stroke rate/year from a score of 3 (DM, age, HTN). However, given his multiple falls, hx of subdural hematoma, he is not an anticoagulation candidate.    2. Bigeminy, PVCs on telemetry Magnesium 1.8 today. Could add PO MagOx daily.   3. Lung mass Managed by pulmonary. Awaiting results of bronchoscopy. Palliative care involved, but patient states he wants treatment if metastatic lung cancer found.   For questions or updates, please contact [place] Please consult [email or web address] for contact info under Cardiology/STEMI.   Signed, Tami Lin Eddie Payette, PA  11/03/2017 9:17  AM

## 2017-11-03 NOTE — Progress Notes (Signed)
Modified Barium Swallow Progress Note  Patient Details  Name: Wyatt Salas MRN: [id number] Date of Birth: Sep 05, 1950  Today's Date: 11/03/2017  Modified Barium Swallow completed.  Full report located under Chart Review in the Imaging Section.  Brief recommendations include the following:  Clinical Impression  Pt presents with mild oral dysphagia and intact pharyngeal swallow ability.  NO aspiration or penetration noted during MBS.  Pt demonstrates extended breath hold independently with swallowing liquids.  Difficulty masticating and transiting solid was noted - likely due to dry bolus, xerostomia and poor dentition.  He did not cough during entire MBS however and thus will follow up briefly to assure tolerance.    Swallow Evaluation Recommendations       SLP Diet Recommendations: Dysphagia 3 (Mech soft) solids;Thin liquid   Liquid Administration via: Straw   Medication Administration: Other (Comment)(as tolerated)       Compensations: Minimize environmental distractions;Slow rate;Small sips/bites   Postural Changes: Remain semi-upright after after feeds/meals (Comment);Seated upright at 90 degrees   Oral Care Recommendations: Oral care QID       Wyatt Salk, MS [facility] SLP [phone]  Wyatt Salas 11/03/2017,9:49 AM

## 2017-11-03 NOTE — Progress Notes (Signed)
   11/03/17 1555  Clinical Encounter Type  Visited With Patient  Visit Type Follow-up;Spiritual support  Spiritual Encounters  Spiritual Needs Prayer;Sacred text   Following up from a visit on Monday. Having read Palliative Notes, Patient is determined to get out of the hospital and back to church.  Did not want to talk about what the Doctors are saying.  He seemed worried today and appeared to have a lot on his mind.  He asked me to read some from the Bible I provided for him on Monday and we did that.  He states he wants to keep fighting to get better.  Will follow and support as needed. Chaplain Katherene Ponto

## 2017-11-04 DIAGNOSIS — I5041 Acute combined systolic (congestive) and diastolic (congestive) heart failure: Secondary | ICD-10-CM

## 2017-11-04 LAB — BASIC METABOLIC PANEL
ANION GAP: 10 (ref 5–15)
BUN: 11 mg/dL (ref 6–20)
CHLORIDE: 99 mmol/L — AB (ref 101–111)
CO2: 22 mmol/L (ref 22–32)
Calcium: 9 mg/dL (ref 8.9–10.3)
Creatinine, Ser: 0.49 mg/dL — ABNORMAL LOW (ref 0.61–1.24)
GFR calc Af Amer: >60 mL/min (ref >60)
GLUCOSE: 144 mg/dL — AB (ref 65–99)
POTASSIUM: 4.2 mmol/L (ref 3.5–5.1)
Sodium: 131 mmol/L — ABNORMAL LOW (ref 135–145)

## 2017-11-04 LAB — GLUCOSE, CAPILLARY
GLUCOSE-CAPILLARY: 203 mg/dL — AB (ref 65–99)
GLUCOSE-CAPILLARY: 139 mg/dL — AB (ref 65–99)
Glucose-Capillary: 196 mg/dL — ABNORMAL HIGH (ref 65–99)
Glucose-Capillary: 155 mg/dL — ABNORMAL HIGH (ref 65–99)

## 2017-11-04 LAB — MAGNESIUM: Magnesium: 1.9 mg/dL (ref 1.7–2.4)

## 2017-11-04 MED ORDER — DIGOXIN 125 MCG PO TABS
0.1875 mg | ORAL_TABLET | Freq: Every day | ORAL | Status: DC
  Administered 2017-11-04 – 2017-11-06 (×3): 0.1875 mg via ORAL
  Filled 2017-11-04 (×7): qty 1

## 2017-11-04 MED ORDER — BENZONATATE 100 MG PO CAPS
200.0000 mg | ORAL_CAPSULE | Freq: Two times a day (BID) | ORAL | Status: DC
  Administered 2017-11-04 – 2017-11-15 (×23): 200 mg via ORAL
  Filled 2017-11-04 (×24): qty 2

## 2017-11-04 MED ORDER — METOPROLOL TARTRATE 50 MG PO TABS
50.0000 mg | ORAL_TABLET | Freq: Three times a day (TID) | ORAL | Status: DC
  Administered 2017-11-04 – 2017-11-07 (×5): 50 mg via ORAL
  Filled 2017-11-04 (×7): qty 1

## 2017-11-04 NOTE — Progress Notes (Signed)
Nutrition Follow-up  DOCUMENTATION CODES:   Severe malnutrition in context of chronic illness, Underweight  INTERVENTION:   Ensure Enlive po BID, each supplement provides 350 kcal and 20 grams of protein  NUTRITION DIAGNOSIS:   Severe Malnutrition related to chronic illness(TBI) as evidenced by severe fat depletion, severe muscle depletion.  Ongoing  GOAL:   Patient will meet greater than or equal to 90% of their needs  Not meeting  MONITOR:   PO intake, Supplement acceptance, Weight trends, Labs  REASON FOR ASSESSMENT:   Low Braden    ASSESSMENT:   67 y.o. male with history of alcohol use, seizure disorder, DM-2, hypertension resented to the ED following a mechanical fall, found to be in A. fib with RVR and admitted to the hospitalist service.   Pt drinking Ensure upon RD visit. Observed increased coughing with Ensure consumption, but he would like to continue with these. Pt remains on aspiration precautions.  Appetite remains poor at this time. Pt ate 20% of of sausage and eggs this morning. Intake documented as 0-50% for his last five meals.   Weight noted to remain stable since last RD visit.   Palliative saw pt 4/17- plan for disposition to SNF with palliative services following.   Medications reviewed and include: folic acid, SSI, MVI with minerals, dilantin, thiamine Labs reviewed: Na 133 (L) AST 57 (H)  Diet Order:  Aspiration precautions DIET DYS 3 Room service appropriate? Yes; Fluid consistency: Thin  EDUCATION NEEDS:   No education needs have been identified at this time  Skin:  Skin Assessment: Reviewed RN Assessment  Last BM:  11/01/17  Height:   Ht Readings from Last 1 Encounters:  11/12/2017 6\' 1"  (1.854 m)    Weight:   Wt Readings from Last 1 Encounters:  11/04/17 126 lb 1.6 oz (57.2 kg)    Ideal Body Weight:  83.64 kg  BMI:  Body mass index is 16.64 kg/m.  Estimated Nutritional Needs:   Kcal:  8250-0370 (33-36  kcal/kg)  Protein:  80-92 grams (1.4-1.6 grams/kg)  Fluid:  >/= 2 L/day    Mariana Single RD, LDN Clinical Nutrition Pager # - [phone]

## 2017-11-04 NOTE — Progress Notes (Signed)
Spoke with cardiology regarding patient's HR and BP with regards to cardizem and metoprolol.  Patient BP was 100/85 and metoprolol 50mg  was held d/t order parameters.  After speaking with cardiology, she instructed me to decrease the cardizem from to 5mg /hr to allow patient's BP to increase and once improve administer the metoprolol.  Will pass on to receiving RN verbal orders.

## 2017-11-04 NOTE — Progress Notes (Signed)
Evening BP 94/72. Lopressor held at this time. Mouna Yager, Bing Neighbors, RN

## 2017-11-04 NOTE — Progress Notes (Signed)
Progress Note  Patient Name: Wyatt Salas Date of Encounter: 11/04/2017  Primary Cardiologist: Glenetta Hew, MD   Subjective   Pt feels much better today and states he wants to see oncology for treatment.  Inpatient Medications    Scheduled Meds: . aspirin  81 mg Oral Daily  . atorvastatin  40 mg Oral q1800  . digoxin  0.1875 mg Oral Daily  . docusate sodium  200 mg Oral BID  . enoxaparin (LOVENOX) injection  40 mg Subcutaneous Q24H  . feeding supplement (ENSURE ENLIVE)  237 mL Oral BID BM  . folic acid  1 mg Oral Daily  . insulin aspart  0-5 Units Subcutaneous QHS  . insulin aspart  0-9 Units Subcutaneous TID WC  . lacosamide  100 mg Oral BID  . levETIRAcetam  1,500 mg Oral BID  . metoprolol tartrate  25 mg Oral BID  . multivitamin with minerals  1 tablet Oral Daily  . phenytoin  30 mg Oral BID  . sodium chloride flush  3 mL Intravenous Q12H  . tamsulosin  0.4 mg Oral Daily  . thiamine  100 mg Oral Daily   Continuous Infusions: . diltiazem (CARDIZEM) infusion 10 mg/hr (11/04/17 0559)   PRN Meds: acetaminophen **OR** acetaminophen, diltiazem, guaiFENesin-dextromethorphan, HYDROcodone-acetaminophen, ondansetron **OR** ondansetron (ZOFRAN) IV, phenol, polyethylene glycol, polyvinyl alcohol   Vital Signs    Vitals:   11/03/17 0659 11/03/17 1146 11/03/17 2100 11/04/17 0500  BP: 93/72 (!) 114/94 101/67   Pulse:  97 96   Resp:  (!) 24 20   Temp:  98.3 F (36.8 C) 98.2 F (36.8 C)   TempSrc:   Oral   SpO2:  100% 94%   Weight:    126 lb 1.6 oz (57.2 kg)  Height:        Intake/Output Summary (Last 24 hours) at 11/04/2017 0810 Last data filed at 11/03/2017 2123 Gross per 24 hour  Intake 3 ml  Output 450 ml  Net -447 ml   Filed Weights   11/01/17 0608 11/11/2017 0646 11/04/17 0500  Weight: 125 lb 14.1 oz (57.1 kg) 125 lb (56.7 kg) 126 lb 1.6 oz (57.2 kg)    Telemetry    Atrial flutter heart rate 90-130s - Personally Reviewed  ECG    No new tracings -  Personally Reviewed  Physical Exam   GEN: No acute distress.   Neck: No JVD Cardiac: irregular rhythm, irregular rate, no murmur Respiratory: Clear to auscultation bilaterally. GI: Soft, nontender, non-distended  MS: No edema; No deformity. Splint on left wrist Neuro:  Nonfocal  Psych: Normal affect   Labs    Chemistry Recent Labs  Lab 10/29/17 1046 10/31/17 0422 11/01/17 0506 11/03/17 0425  NA  --  132* 133* 133*  K  --  4.6 4.5 4.8  CL  --  98* 100* 99*  CO2  --  24 23 22   GLUCOSE  --  162* 170* 163*  BUN  --  10 9 9   CREATININE  --  0.56* 0.53* 0.57*  CALCIUM  --  9.0 9.1 9.4  PROT 5.9* 6.0*  --  7.0  ALBUMIN  --  2.3*  --  2.4*  AST  --  38  --  57*  ALT  --  33  --  53  ALKPHOS  --  75  --  88  BILITOT  --  0.2*  --  0.3  GFRNONAA  --  >60 >60 >60  GFRAA  --  >60 >  60 >60  ANIONGAP  --  10 10 12      Hematology Recent Labs  Lab 10/31/17 0422 11/01/17 0506 11/03/17 0425  WBC 9.7 10.4 11.3*  RBC 4.40 4.09* 4.43  HGB 11.3* 10.6* 11.1*  HCT 36.2* 33.5* 36.1*  MCV 82.3 81.9 81.5  MCH 25.7* 25.9* 25.1*  MCHC 31.2 31.6 30.7  RDW 15.2 15.2 15.0  PLT 328 384 462*    Cardiac EnzymesNo results for input(s): TROPONINI in the last 168 hours. No results for input(s): TROPIPOC in the last 168 hours.   BNPNo results for input(s): BNP, PROBNP in the last 168 hours.   DDimer No results for input(s): DDIMER in the last 168 hours.   Radiology    Dg Swallowing Func-speech Pathology  Result Date: 11/03/2017 Objective Swallowing Evaluation: Type of Study: MBS-Modified Barium Swallow Study  Patient Details Name: JACKY DROSS MRN: [id number] Date of Birth: 1950/11/05 Today's Date: 11/03/2017 Time: SLP Start Time (ACUTE ONLY): 0910 -SLP Stop Time (ACUTE ONLY): 0930 SLP Time Calculation (min) (ACUTE ONLY): 20 min Past Medical History: Past Medical History: Diagnosis Date . Diabetes mellitus without complication ([place])   Type II . Diastolic dysfunction   a. ECHO 07/2013 EF  55-60% and grade 1 diastolic dysfunction . NSTEMI (non-ST elevated myocardial infarction) ([place])   problem list 01/2014 . Rhabdomyolysis 01/2014 . Seizures ([place])  . Syncope 01/2014 . Traumatic brain injury [facility])  Past Surgical History: Past Surgical History: Procedure Laterality Date . CRANIOTOMY   . VIDEO BRONCHOSCOPY Bilateral 11/11/2017  Procedure: VIDEO BRONCHOSCOPY WITH FLUORO;  Surgeon: Collene Gobble, MD;  Location: [facility];  Service: Cardiopulmonary;  Laterality: Bilateral; HPI: 67 yo male adm to [facility] with AMS - Pt found to have right lung mass, TBI, SDH, ETOH use, seizures, essentially wheelchair bound - s/p falls with left humerus fx and ankle injury.  Pt is s/p thoracentesis 10/29/17 and bronchoscopy 10/22/2017 with findings abnormal cells.  Swallow eval ordered.   Imaging studies showed right cerebral encephalomalacia and severe cerebellar atrophy.   Pt underwent BSE and was found to be high aspiration risk, MBS ordered.   Subjective: pt awake in chair Assessment / Plan / Recommendation CHL IP CLINICAL IMPRESSIONS 11/03/2017 Clinical Impression Pt presents with mild oral dysphagia and intact pharyngeal swallow ability.  NO aspiration or penetration noted during MBS.  Pt demonstrates extended breath hold independently with swallowing liquids.  Difficulty masticating and transiting solid was noted - likely due to dry bolus, xerostomia and poor dentition.  He did not cough during entire MBS however and thus will follow up briefly to assure tolerance.  SLP Visit Diagnosis Dysphagia, oral phase (R13.11) Attention and concentration deficit following -- Frontal lobe and executive function deficit following -- Impact on safety and function Mild aspiration risk   CHL IP TREATMENT RECOMMENDATION 11/03/2017 Treatment Recommendations Therapy as outlined in treatment plan below   Prognosis 11/03/2017 Prognosis for Safe Diet Advancement Good Barriers to Reach Goals -- Barriers/Prognosis Comment -- CHL IP DIET  RECOMMENDATION 11/03/2017 SLP Diet Recommendations Dysphagia 3 (Mech soft) solids;Thin liquid Liquid Administration via Straw Medication Administration Other (Comment) Compensations Minimize environmental distractions;Slow rate;Small sips/bites Postural Changes Remain semi-upright after after feeds/meals (Comment);Seated upright at 90 degrees   CHL IP OTHER RECOMMENDATIONS 11/03/2017 Recommended Consults -- Oral Care Recommendations Oral care QID Other Recommendations --   CHL IP FOLLOW UP RECOMMENDATIONS 11/11/2017 Follow up Recommendations Other (comment)   CHL IP FREQUENCY AND DURATION 11/03/2017 Speech Therapy Frequency (ACUTE ONLY) min 1 x/week Treatment  Duration 1 week      CHL IP ORAL PHASE 11/03/2017 Oral Phase Impaired Oral - Pudding Teaspoon -- Oral - Pudding Cup -- Oral - Honey Teaspoon -- Oral - Honey Cup -- Oral - Nectar Teaspoon WFL Oral - Nectar Cup -- Oral - Nectar Straw -- Oral - Thin Teaspoon WFL Oral - Thin Cup -- Oral - Thin Straw WFL;Piecemeal swallowing Oral - Puree WFL;Piecemeal swallowing Oral - Mech Soft -- Oral - Regular Impaired mastication;Weak lingual manipulation;Piecemeal swallowing;Delayed oral transit Oral - Multi-Consistency -- Oral - Pill WFL Oral Phase - Comment --  CHL IP PHARYNGEAL PHASE 11/03/2017 Pharyngeal Phase WFL Pharyngeal- Pudding Teaspoon -- Pharyngeal -- Pharyngeal- Pudding Cup -- Pharyngeal -- Pharyngeal- Honey Teaspoon -- Pharyngeal -- Pharyngeal- Honey Cup -- Pharyngeal -- Pharyngeal- Nectar Teaspoon -- Pharyngeal -- Pharyngeal- Nectar Cup -- Pharyngeal -- Pharyngeal- Nectar Straw -- Pharyngeal -- Pharyngeal- Thin Teaspoon -- Pharyngeal -- Pharyngeal- Thin Cup -- Pharyngeal -- Pharyngeal- Thin Straw -- Pharyngeal -- Pharyngeal- Puree -- Pharyngeal -- Pharyngeal- Mechanical Soft -- Pharyngeal -- Pharyngeal- Regular -- Pharyngeal -- Pharyngeal- Multi-consistency -- Pharyngeal -- Pharyngeal- Pill -- Pharyngeal -- Pharyngeal Comment --  CHL IP CERVICAL ESOPHAGEAL PHASE  11/03/2017 Cervical Esophageal Phase WFL Pudding Teaspoon -- Pudding Cup -- Honey Teaspoon -- Honey Cup -- Nectar Teaspoon -- Nectar Cup -- Nectar Straw -- Thin Teaspoon -- Thin Cup -- Thin Straw -- Puree -- Mechanical Soft -- Regular -- Multi-consistency -- Pill -- Cervical Esophageal Comment -- No flowsheet data found. 11/03/2017, 9:49 AM Luanna Salk, MS [facility] SLP [phone]               Cardiac Studies   Echo 11/03/17: Study Conclusions - Left ventricle: The cavity size was normal. Wall thickness was   normal. Systolic function was mildly to moderately reduced. The   estimated ejection fraction was in the range of 40% to 45%.   Diffuse hypokinesis. Features are consistent with a pseudonormal   left ventricular filling pattern, with concomitant abnormal   relaxation and increased filling pressure (grade 2 diastolic   dysfunction). - Mitral valve: There was mild regurgitation. - Tricuspid valve: There was trivial regurgitation. - Pericardium, extracardiac: A large pericardial effusion (up to   3cm) was identified circumferential to the heart. The fluid had   no internal echoes. No RA or RV collapse. IVC is small in   diameter but does not demonstrate respiratory variation. There   was no significant evidence of hemodynamic compromise.  Impressions: - Pericardial effusion is new when compared to prior.   Patient Profile     67 y.o. male with a hx of high fall risk with multiple falls in the past 2 months with multiple fractures, wheel chair dependent, remote hx of subdural hematoma s/p right sided craniotomy (?2000), seizure disorder, alcohol abuse, chronic hep C, HTN, HDL, chronic diastolic heart failure, and lung mass concerning for cancer who is being seen for the evaluation of Afib RVR.  Assessment & Plan    1. Atrial flutter - he received IV digoxin 0.125 mg x 2 yesterday + 0.25 mg IV digoxin x 1 - will start 0.1875 mg PO digoxin today - continue lopressor 25 mg BID - This  patients CHA2DS2-VASc Score and unadjusted Ischemic Stroke Rate (% per year) is equal to 4.8 % stroke rate/year from a score of 4 (CHF, HTN, DM, age). However, patient is not an anticoagulation candidate given his multiple falls with fractures and hx of subdural hematoma - currently atrial flutter  with rates 90-130s, patient remains asymptoamtic   2. Non-small cell carcinoma - biopsy pending - oncology engaged - palliative care medicine engaged    For questions or updates, please contact [place] Please consult [email or web address] for contact info under Cardiology/STEMI.      Signed, Tami Lin Duke, PA  11/04/2017, 8:10 AM

## 2017-11-04 NOTE — Progress Notes (Signed)
Occupational Therapy Treatment Patient Details Name: Wyatt Salas MRN: [id number] DOB: [birth year] Today's Date: 11/04/2017    History of present illness pt was admitted for A Fib with RVR.  PMH:  TBI, ETOH, DM, crani.  Had L distal elbow fx last month   OT comments  Encouraged pt to use LUE; sometimes incorporates without cues and sometimes not.  Homework is to work on opposing thumb and index finger towards "O" shape  Follow Up Recommendations  SNF    Equipment Recommendations  3 in 1 bedside commode    Recommendations for Other Services      Precautions / Restrictions Precautions Precautions: Fall Precaution Comments: Per Dr [name], ROM OK to tolerance, splint just removed; follow up xray pending, WBAT, monitor BP- orthostatic Other Brace/Splint: L wrist splint Restrictions LUE Weight Bearing: Weight bearing as tolerated       Mobility Bed Mobility                  Transfers                      Balance                                           ADL either performed or assessed with clinical judgement   ADL   Eating/Feeding: Set up;Bed level                                     General ADL Comments: unable to open condiments.  Encouraged pt to use L as functional assist; often he does but not always.  Used to turn pages of book.  Worked on [employer] of shoulder.  AROM wrist and fingers and gravity eliminated wrist.  Reapplied splint as it had shifted and indentation noted at base of thumb. Asked NT to pass along to check skin every shift     Vision       Perception     Praxis      Cognition Arousal/Alertness: Awake/alert Behavior During Therapy: WFL for tasks assessed/performed Overall Cognitive Status: No family/caregiver present to determine baseline cognitive functioning                                          Exercises     Shoulder Instructions       General Comments       Pertinent Vitals/ Pain       Pain Assessment: Faces Faces Pain Scale: Hurts little more Pain Location: L ue Pain Descriptors / Indicators: Aching Pain Intervention(s): Limited activity within patient's tolerance;Monitored during session;Premedicated before session;Repositioned  Home Living                                          Prior Functioning/Environment              Frequency           Progress Toward Goals  OT Goals(current goals can now be found in the care plan section)  Progress towards OT goals: Progressing toward goals     Plan  Co-evaluation                 AM-PAC PT "6 Clicks" Daily Activity     Outcome Measure   Help from another person eating meals?: A Little Help from another person taking care of personal grooming?: A Little Help from another person toileting, which includes using toliet, bedpan, or urinal?: A Little Help from another person bathing (including washing, rinsing, drying)?: A Little Help from another person to put on and taking off regular upper body clothing?: A Little Help from another person to put on and taking off regular lower body clothing?: A Little 6 Click Score: 18    End of Session    OT Visit Diagnosis: Muscle weakness (generalized) (M62.81);Pain Pain - Right/Left: Left Pain - part of body: Arm   Activity Tolerance Patient tolerated treatment well   Patient Left in bed;with call bell/phone within reach;with bed alarm set   Nurse Communication          Time: 1438-1500 OT Time Calculation (min): 22 min  Charges: OT General Charges $OT Visit: 1 Visit OT Treatments $Therapeutic Activity: 8-22 mins  [name], OTR/L [phone] 11/04/2017   Kazimierz Springborn 11/04/2017, 3:05 PM

## 2017-11-04 NOTE — Progress Notes (Signed)
Obtained [facility] Medicare prior authorization for pt to admit to SNF at [place]- [id number]. Pt has selected [facility].  Will assist with transition there at DC.  Sharren Bridge, MSW, LCSW Clinical Social Work 11/04/2017 [phone]

## 2017-11-04 NOTE — Progress Notes (Addendum)
PROGRESS NOTE    Wyatt Salas  KGU:[id number] DOB: 12-06-1950 DOA: 10/23/2017 PCP: [name], MD   Brief Narrative: Wyatt Salas is a 67 y.o. male with a history of alcohol use, seizure disorder, DM-2, hypertension, known lung mass. He presented secondary to a fall, found to be in atrial fibrillation with RVR. 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 Biopsy pending. Plan for SNF on discharge.   Assessment & Plan:   Principal Problem:   Atrial fibrillation with RVR (HCC) Active Problems:   Seizure disorder (HCC)   Type 2 diabetes mellitus with hyperlipidemia (HCC)   Lung mass   Essential hypertension   Acute urinary retention   Chronic diastolic CHF (congestive heart failure) (HCC)   Closed fracture of distal end of left humerus   Protein-calorie malnutrition, severe   Atrial fibrillation w/ RVR Still with episodes of tachycardia into 120s. CHA2DS2-VASc Score is 3. -Cardiology recommendations: Cardizem, digoxin, metoprolol -Continue metoprolol and Cardizem drip  Non-small cell carcinoma Patient is s/p bronchoscopy with bronchial brushing significant for non-small cell carcinoma. -Biopsy pending -Discussed with Oncology on 4/17, [name], who will arrange for either her or [name] to see patient as an outpatient. -Palliative care medicine recommendations: outpatient palliative care  Pericardial effusion 3cm on Transthoracic Echocardiogram. -Cardiology recommendations  Essential hypertension Well controlled and slightly low at times. -Continue metoprolol. Diltiazem drip titrating.  Dysphagia -Dysphagia 3 diet per speech therapy recommendations  Left humerus fracture Secondary to fall. Outpatient follow-up with Dr. [name].  History of alcohol use Out of window for DTs  Deconditioning Frequent falls -PT eval: SNF  Left ankle pain Likely arthritic. Symptomatic management.  Acute  urinary retention Continue Flomax. Foley placed on 4/13. Plan for outpatient urology follow-up.  Diabetes mellitus, type 2 -Continue SSI  Seizure disorder -Continue Keppra and Dilantin  Chronic combined systolic and diastolic heart failure EF of 40-45% with grade 2 diastolic dysfunction. Diffuse hypokinesis.    DVT prophylaxis: Lovenox Code Status:   Code Status: Full Code Family Communication: None at bedside Disposition Plan: Discharge to SNF when medically ready   Consultants:   Pulmonology  Cardiology  Palliative care medicine  Oncology (telepone)  Procedures:   Bronchoscopy (11/09/2017)  Transthoracic Echocardiogram (11/03/17) Study Conclusions - Left ventricle: The cavity size was normal. Wall thickness was normal. Systolic function was mildly to moderately reduced. The estimated ejection fraction was in the range of 40% to 45%. Diffuse hypokinesis. Features are consistent with a pseudonormal left ventricular filling pattern, with concomitant abnormal relaxation and increased filling pressure (grade 2 diastolic dysfunction). - Mitral valve: There was mild regurgitation. - Tricuspid valve: There was trivial regurgitation. - Pericardium, extracardiac: A large pericardial effusion (up to 3cm) was identified circumferential to the heart. The fluid had no internal echoes. No RA or RV collapse. IVC is small in diameter but does not demonstrate respiratory variation. There was no significant evidence of hemodynamic compromise.  Impressions: - Pericardial effusion is new when compared to prior.     Antimicrobials:  None    Subjective: Scratchy throat. Otherwise, no issues overnight.  Objective: Vitals:   11/03/17 2100 11/04/17 0500 11/04/17 0800 11/04/17 0845  BP: 101/67   110/72  Pulse: 96   (!) 128  Resp: 20   (!) 22  Temp: 98.2 F (36.8 C)  98.5 F (36.9 C)   TempSrc: Oral  Oral   SpO2: 94%  96% 96%  Weight:  57.2 kg (126  lb 1.6 oz)    Height:        Intake/Output Summary (Last 24 hours) at 11/04/2017 0949 Last data filed at 11/04/2017 0845 Gross per 24 hour  Intake 3 ml  Output 850 ml  Net -847 ml   Filed Weights   11/01/17 0608 10/20/2017 0646 11/04/17 0500  Weight: 57.1 kg (125 lb 14.1 oz) 56.7 kg (125 lb) 57.2 kg (126 lb 1.6 oz)    Examination:  General exam: Appears calm and comfortable Respiratory: Clear to auscultation bilaterally. Unlabored work of breathing. No wheezing or rales. Cardiovascular system: Fast rate and irregular rhythm. Normal S1 and S2. No heart murmurs present. No extra heart sounds Gastrointestinal system: Soft, non-tender, non-distended, no guarding, no rebound, no masses felt Central nervous system: Alert and oriented. No focal neurological deficits. Extremities: No edema. No calf tenderness Skin: No cyanosis. No rashes Psychiatry: Judgement and insight appear normal. Flat affect    Data Reviewed: I have personally reviewed following labs and imaging studies  CBC: Recent Labs  Lab 10/29/17 0426 10/31/17 0422 11/01/17 0506 11/03/17 0425  WBC 9.1 9.7 10.4 11.3*  HGB 10.8* 11.3* 10.6* 11.1*  HCT 34.8* 36.2* 33.5* 36.1*  MCV 82.3 82.3 81.9 81.5  PLT 346 328 384 409*   Basic Metabolic Panel: Recent Labs  Lab 10/29/17 0426 10/31/17 0422 11/01/17 0506 11/03/17 0425  NA 135 132* 133* 133*  K 3.8 4.6 4.5 4.8  CL 101 98* 100* 99*  CO2 25 24 23 22   GLUCOSE 138* 162* 170* 163*  BUN 8 10 9 9   CREATININE 0.54* 0.56* 0.53* 0.57*  CALCIUM 9.0 9.0 9.1 9.4  MG 1.9 1.7 1.7 1.8   GFR: Estimated Creatinine Clearance: 71.5 mL/min (A) (by C-G formula based on SCr of 0.57 mg/dL (L)). Liver Function Tests: Recent Labs  Lab 10/29/17 1046 10/31/17 0422 11/03/17 0425  AST  --  38 57*  ALT  --  33 53  ALKPHOS  --  75 88  BILITOT  --  0.2* 0.3  PROT 5.9* 6.0* 7.0  ALBUMIN  --  2.3* 2.4*   No results for input(s): LIPASE, AMYLASE in the last 168 hours. No results  for input(s): AMMONIA in the last 168 hours. Coagulation Profile: No results for input(s): INR, PROTIME in the last 168 hours. Cardiac Enzymes: No results for input(s): CKTOTAL, CKMB, CKMBINDEX, TROPONINI in the last 168 hours. BNP (last 3 results) No results for input(s): PROBNP in the last 8760 hours. HbA1C: No results for input(s): HGBA1C in the last 72 hours. CBG: Recent Labs  Lab 11/03/17 0752 11/03/17 1143 11/03/17 1713 11/03/17 2128 11/04/17 0759  GLUCAP 167* 171* 144* 183* 196*   Lipid Profile: No results for input(s): CHOL, HDL, LDLCALC, TRIG, CHOLHDL, LDLDIRECT in the last 72 hours. Thyroid Function Tests: No results for input(s): TSH, T4TOTAL, FREET4, T3FREE, THYROIDAB in the last 72 hours. Anemia Panel: No results for input(s): VITAMINB12, FOLATE, FERRITIN, TIBC, IRON, RETICCTPCT in the last 72 hours. Sepsis Labs: No results for input(s): PROCALCITON, LATICACIDVEN in the last 168 hours.  Recent Results (from the past 240 hour(s))  Urine culture     Status: Abnormal   Collection Time: 10/25/2017 11:19 PM  Result Value Ref Range Status   Specimen Description   Final    URINE, CATHETERIZED Performed at [facility] [address] [place], [place] [address]    Special Requests   Final    NONE Performed at [facility], [place]  [address] [place], [place] [address]    Culture (A)  Final    <10,000 COLONIES/mL INSIGNIFICANT GROWTH Performed at [facility] [address], [place], [place] [address]    Report Status 10/28/2017 FINAL  Final  MRSA PCR Screening     Status: None   Collection Time: 10/27/17  9:21 AM  Result Value Ref Range Status   MRSA by PCR NEGATIVE NEGATIVE Final    Comment:        The GeneXpert MRSA Assay (FDA approved for NASAL specimens only), is one component of a comprehensive MRSA colonization surveillance program. It is not intended to diagnose MRSA infection nor to guide or monitor  treatment for MRSA infections. Performed at [facility], [place] [address] [place], [place] [address]   Culture, sputum-assessment     Status: None   Collection Time: 10/27/17  3:30 PM  Result Value Ref Range Status   Specimen Description EXPECTORATED SPUTUM  Final   Special Requests NONE  Final   Sputum evaluation   Final    THIS SPECIMEN IS ACCEPTABLE FOR SPUTUM CULTURE Performed at [facility], [place] [address], [place], [place] [address]    Report Status 10/27/2017 FINAL  Final  Culture, respiratory (NON-Expectorated)     Status: None   Collection Time: 10/27/17  3:30 PM  Result Value Ref Range Status   Specimen Description   Final    EXPECTORATED SPUTUM Performed at [facility], [place] [address] [place], [place] [address]    Special Requests   Final    NONE Reflexed from [id number] Performed at [facility], [place] [address], [place], [place] [address]    Gram Stain   Final    FEW WBC PRESENT,BOTH PMN AND MONONUCLEAR MODERATE GRAM POSITIVE COCCI    Culture   Final    Consistent with normal respiratory flora. Performed at [facility], [place] [address], [place], [place] [address]    Report Status 10/30/2017 FINAL  Final  Body fluid culture     Status: None   Collection Time: 10/29/17 10:26 AM  Result Value Ref Range Status   Specimen Description   Final    PLEURAL RIGHT Performed at [facility] [address], [place], [place] [address]    Special Requests   Final    NONE Performed at [facility], [place] [address] [place], [place] [address]    Gram Stain   Final    FEW WBC PRESENT, PREDOMINANTLY MONONUCLEAR NO ORGANISMS SEEN    Culture   Final    NO GROWTH 3 DAYS Performed at [facility] [address] [place], [place] [address]    Report Status 11/01/2017 FINAL  Final         Radiology Studies: Dg Swallowing Func-speech  Pathology  Result Date: 11/03/2017 Objective Swallowing Evaluation: Type of Study: MBS-Modified Barium Swallow Study  Patient Details Name: Wyatt Salas MRN: [id number] Date of Birth: 03-08-1951 Today's Date: 11/03/2017 Time: SLP Start Time (ACUTE ONLY): 0910 -SLP Stop Time (ACUTE ONLY): 0930 SLP Time Calculation (min) (ACUTE ONLY): 20 min Past Medical History: Past Medical History: Diagnosis Date . Diabetes mellitus without complication ([place])   Type II . Diastolic dysfunction   a. ECHO 07/2013 EF 55-60% and grade 1 diastolic dysfunction . NSTEMI (non-ST elevated myocardial infarction) ([place])   problem list 01/2014 . Rhabdomyolysis 01/2014 . Seizures ([place])  . Syncope 01/2014 . Traumatic brain injury (  [place])  Past Surgical History: Past Surgical History: Procedure Laterality Date . CRANIOTOMY   . VIDEO BRONCHOSCOPY Bilateral 10/25/2017  Procedure: VIDEO BRONCHOSCOPY WITH FLUORO;  Surgeon: Collene Gobble, MD;  Location: [facility];  Service: Cardiopulmonary;  Laterality: Bilateral; HPI: 67 yo male adm to [facility] with AMS - Pt found to have right lung mass, TBI, SDH, ETOH use, seizures, essentially wheelchair bound - s/p falls with left humerus fx and ankle injury.  Pt is s/p thoracentesis 10/29/17 and bronchoscopy 10/20/2017 with findings abnormal cells.  Swallow eval ordered.   Imaging studies showed right cerebral encephalomalacia and severe cerebellar atrophy.   Pt underwent BSE and was found to be high aspiration risk, MBS ordered.   Subjective: pt awake in chair Assessment / Plan / Recommendation CHL IP CLINICAL IMPRESSIONS 11/03/2017 Clinical Impression Pt presents with mild oral dysphagia and intact pharyngeal swallow ability.  NO aspiration or penetration noted during MBS.  Pt demonstrates extended breath hold independently with swallowing liquids.  Difficulty masticating and transiting solid was noted - likely due to dry bolus, xerostomia and poor dentition.  He did not cough during entire MBS however and thus will  follow up briefly to assure tolerance.  SLP Visit Diagnosis Dysphagia, oral phase (R13.11) Attention and concentration deficit following -- Frontal lobe and executive function deficit following -- Impact on safety and function Mild aspiration risk   CHL IP TREATMENT RECOMMENDATION 11/03/2017 Treatment Recommendations Therapy as outlined in treatment plan below   Prognosis 11/03/2017 Prognosis for Safe Diet Advancement Good Barriers to Reach Goals -- Barriers/Prognosis Comment -- CHL IP DIET RECOMMENDATION 11/03/2017 SLP Diet Recommendations Dysphagia 3 (Mech soft) solids;Thin liquid Liquid Administration via Straw Medication Administration Other (Comment) Compensations Minimize environmental distractions;Slow rate;Small sips/bites Postural Changes Remain semi-upright after after feeds/meals (Comment);Seated upright at 90 degrees   CHL IP OTHER RECOMMENDATIONS 11/03/2017 Recommended Consults -- Oral Care Recommendations Oral care QID Other Recommendations --   CHL IP FOLLOW UP RECOMMENDATIONS 11/15/2017 Follow up Recommendations Other (comment)   CHL IP FREQUENCY AND DURATION 11/03/2017 Speech Therapy Frequency (ACUTE ONLY) min 1 x/week Treatment Duration 1 week      CHL IP ORAL PHASE 11/03/2017 Oral Phase Impaired Oral - Pudding Teaspoon -- Oral - Pudding Cup -- Oral - Honey Teaspoon -- Oral - Honey Cup -- Oral - Nectar Teaspoon WFL Oral - Nectar Cup -- Oral - Nectar Straw -- Oral - Thin Teaspoon WFL Oral - Thin Cup -- Oral - Thin Straw WFL;Piecemeal swallowing Oral - Puree WFL;Piecemeal swallowing Oral - Mech Soft -- Oral - Regular Impaired mastication;Weak lingual manipulation;Piecemeal swallowing;Delayed oral transit Oral - Multi-Consistency -- Oral - Pill WFL Oral Phase - Comment --  CHL IP PHARYNGEAL PHASE 11/03/2017 Pharyngeal Phase WFL Pharyngeal- Pudding Teaspoon -- Pharyngeal -- Pharyngeal- Pudding Cup -- Pharyngeal -- Pharyngeal- Honey Teaspoon -- Pharyngeal -- Pharyngeal- Honey Cup -- Pharyngeal -- Pharyngeal-  Nectar Teaspoon -- Pharyngeal -- Pharyngeal- Nectar Cup -- Pharyngeal -- Pharyngeal- Nectar Straw -- Pharyngeal -- Pharyngeal- Thin Teaspoon -- Pharyngeal -- Pharyngeal- Thin Cup -- Pharyngeal -- Pharyngeal- Thin Straw -- Pharyngeal -- Pharyngeal- Puree -- Pharyngeal -- Pharyngeal- Mechanical Soft -- Pharyngeal -- Pharyngeal- Regular -- Pharyngeal -- Pharyngeal- Multi-consistency -- Pharyngeal -- Pharyngeal- Pill -- Pharyngeal -- Pharyngeal Comment --  CHL IP CERVICAL ESOPHAGEAL PHASE 11/03/2017 Cervical Esophageal Phase WFL Pudding Teaspoon -- Pudding Cup -- Honey Teaspoon -- Honey Cup -- Nectar Teaspoon -- Nectar Cup -- Nectar Straw -- Thin Teaspoon -- Thin Cup -- Thin Straw -- Puree --  Mechanical Soft -- Regular -- Multi-consistency -- Pill -- Cervical Esophageal Comment -- No flowsheet data found. 11/03/2017, 9:49 AM Luanna Salk, MS [facility] SLP [phone]                   Scheduled Meds: . aspirin  81 mg Oral Daily  . atorvastatin  40 mg Oral q1800  . digoxin  0.1875 mg Oral Daily  . docusate sodium  200 mg Oral BID  . enoxaparin (LOVENOX) injection  40 mg Subcutaneous Q24H  . feeding supplement (ENSURE ENLIVE)  237 mL Oral BID BM  . folic acid  1 mg Oral Daily  . insulin aspart  0-5 Units Subcutaneous QHS  . insulin aspart  0-9 Units Subcutaneous TID WC  . lacosamide  100 mg Oral BID  . levETIRAcetam  1,500 mg Oral BID  . metoprolol tartrate  25 mg Oral BID  . multivitamin with minerals  1 tablet Oral Daily  . phenytoin  30 mg Oral BID  . sodium chloride flush  3 mL Intravenous Q12H  . tamsulosin  0.4 mg Oral Daily  . thiamine  100 mg Oral Daily   Continuous Infusions: . diltiazem (CARDIZEM) infusion 10 mg/hr (11/04/17 0559)     LOS: 8 days     Cordelia Poche, MD [facility] 11/04/2017, 9:49 AM Pager: [phone]  If 7PM-7AM, please contact night-coverage [email or web address] Password [facility] 11/04/2017, 9:49 AM

## 2017-11-04 NOTE — Progress Notes (Cosign Needed Addendum)
RUL bronchial brushing consistent with NSCLC.  Primary SVC has involved Oncology.  Agree with his disposition to SNF.  Follow up with ONC as arranged.  PCCM will be available PRN. Please call back if new needs arise.  Plan discussed with Dr. Lynetta Mare.   Wyatt Gens, NP-C [facility] Pgr: [phone] or if no answer [phone] 11/04/2017, 11:18 AM

## 2017-11-05 ENCOUNTER — Inpatient Hospital Stay (HOSPITAL_COMMUNITY): Payer: Medicare HMO

## 2017-11-05 ENCOUNTER — Encounter (HOSPITAL_COMMUNITY): Admission: EM | Disposition: E | Payer: Self-pay | Source: Home / Self Care | Attending: Family Medicine

## 2017-11-05 DIAGNOSIS — I314 Cardiac tamponade: Secondary | ICD-10-CM

## 2017-11-05 HISTORY — PX: PERICARDIOCENTESIS: CATH118255

## 2017-11-05 LAB — BODY FLUID CELL COUNT WITH DIFFERENTIAL
Eos, Fluid: 0 %
Lymphs, Fluid: 18 %
Monocyte-Macrophage-Serous Fluid: 6 % — ABNORMAL LOW (ref 50–90)
NEUTROPHIL FLUID: 76 % — AB (ref 0–25)
Total Nucleated Cell Count, Fluid: 750 cu mm (ref 0–1000)

## 2017-11-05 LAB — GLUCOSE, CAPILLARY
GLUCOSE-CAPILLARY: 114 mg/dL — AB (ref 65–99)
Glucose-Capillary: 165 mg/dL — ABNORMAL HIGH (ref 65–99)
Glucose-Capillary: 218 mg/dL — ABNORMAL HIGH (ref 65–99)
Glucose-Capillary: 203 mg/dL — ABNORMAL HIGH (ref 65–99)

## 2017-11-05 LAB — CBC
HEMATOCRIT: 33.2 % — AB (ref 39.0–52.0)
Hemoglobin: 10.8 g/dL — ABNORMAL LOW (ref 13.0–17.0)
MCH: 26.1 pg (ref 26.0–34.0)
MCHC: 32.5 g/dL (ref 30.0–36.0)
MCV: 80.2 fL (ref 78.0–100.0)
PLATELETS: 441 10*3/uL — AB (ref 150–400)
RBC: 4.14 MIL/uL — ABNORMAL LOW (ref 4.22–5.81)
RDW: 15.1 % (ref 11.5–15.5)
WBC: 9.4 10*3/uL (ref 4.0–10.5)

## 2017-11-05 LAB — CREATININE, SERUM
Creatinine, Ser: 0.55 mg/dL — ABNORMAL LOW (ref 0.61–1.24)
GFR calc non Af Amer: >60 mL/min (ref >60)

## 2017-11-05 SURGERY — PERICARDIOCENTESIS
Anesthesia: LOCAL

## 2017-11-05 MED ORDER — MIDAZOLAM HCL 2 MG/2ML IJ SOLN
INTRAMUSCULAR | Status: AC
Start: 2017-11-05 — End: ?
  Filled 2017-11-05: qty 2

## 2017-11-05 MED ORDER — SODIUM CHLORIDE 0.9 % IV SOLN: INTRAVENOUS | Status: DC

## 2017-11-05 MED ORDER — HEPARIN (PORCINE) IN NACL 1000-0.9 UT/500ML-% IV SOLN
INTRAVENOUS | Status: AC
  Filled 2017-11-05: qty 500

## 2017-11-05 MED ORDER — SODIUM CHLORIDE 0.9% FLUSH: 3.0000 mL | INTRAVENOUS | Status: DC | PRN

## 2017-11-05 MED ORDER — SODIUM CHLORIDE 0.9 % IV SOLN
250.0000 mL | INTRAVENOUS | Status: DC | PRN
Start: 2017-11-05 — End: 2017-11-17

## 2017-11-05 MED ORDER — SODIUM CHLORIDE 0.9 % IV SOLN: 250.0000 mL | INTRAVENOUS | Status: DC | PRN

## 2017-11-05 MED ORDER — ACETAMINOPHEN 325 MG PO TABS
650.0000 mg | ORAL_TABLET | ORAL | Status: DC | PRN
  Administered 2017-11-14: 650 mg via ORAL
  Filled 2017-11-05 (×2): qty 2

## 2017-11-05 MED ORDER — ONDANSETRON HCL 4 MG/2ML IJ SOLN: 4.0000 mg | Freq: Four times a day (QID) | INTRAMUSCULAR | Status: DC | PRN

## 2017-11-05 MED ORDER — SODIUM CHLORIDE 0.9 % IV SOLN
INTRAVENOUS | Status: AC
  Administered 2017-11-05: 100 mL/h via INTRAVENOUS
  Administered 2017-11-05: 18:00:00 via INTRAVENOUS

## 2017-11-05 MED ORDER — FENTANYL CITRATE (PF) 100 MCG/2ML IJ SOLN
INTRAMUSCULAR | Status: DC | PRN
  Administered 2017-11-05: 25 ug via INTRAVENOUS

## 2017-11-05 MED ORDER — HEPARIN (PORCINE) IN NACL 2-0.9 UNITS/ML
INTRAMUSCULAR | Status: AC | PRN
  Administered 2017-11-05: 500 mL

## 2017-11-05 MED ORDER — MIDAZOLAM HCL 2 MG/2ML IJ SOLN
INTRAMUSCULAR | Status: DC | PRN
  Administered 2017-11-05: 1 mg via INTRAVENOUS

## 2017-11-05 MED ORDER — LIDOCAINE HCL (PF) 1 % IJ SOLN
INTRAMUSCULAR | Status: DC | PRN
  Administered 2017-11-05: 5 mL via INTRADERMAL

## 2017-11-05 MED ORDER — FENTANYL CITRATE (PF) 100 MCG/2ML IJ SOLN
INTRAMUSCULAR | Status: AC
  Filled 2017-11-05: qty 2

## 2017-11-05 MED ORDER — SODIUM CHLORIDE 0.9% FLUSH: 3.0000 mL | Freq: Two times a day (BID) | INTRAVENOUS | Status: DC

## 2017-11-05 MED ORDER — LIDOCAINE HCL (PF) 1 % IJ SOLN
INTRAMUSCULAR | Status: AC
  Filled 2017-11-05: qty 30

## 2017-11-05 MED ORDER — HEPARIN SODIUM (PORCINE) 5000 UNIT/ML IJ SOLN
5000.0000 [IU] | Freq: Three times a day (TID) | INTRAMUSCULAR | Status: DC
Start: 2017-11-05 — End: 2017-11-17
  Administered 2017-11-05 – 2017-11-17 (×32): 5000 [IU] via SUBCUTANEOUS
  Filled 2017-11-05 (×31): qty 1

## 2017-11-05 MED ORDER — SODIUM CHLORIDE 0.9% FLUSH
3.0000 mL | Freq: Two times a day (BID) | INTRAVENOUS | Status: DC
  Administered 2017-11-06 – 2017-11-18 (×21): 3 mL via INTRAVENOUS

## 2017-11-05 MED ORDER — AMIODARONE HCL 200 MG PO TABS
400.0000 mg | ORAL_TABLET | Freq: Two times a day (BID) | ORAL | Status: DC
  Administered 2017-11-05 – 2017-11-07 (×5): 400 mg via ORAL
  Filled 2017-11-05 (×5): qty 2

## 2017-11-05 SURGICAL SUPPLY — 4 items
PACK CARDIAC CATHETERIZATION (CUSTOM PROCEDURE TRAY) ×1 IMPLANT
PERIVAC PERICARDIOCENTESIS 8.3 (TRAY / TRAY PROCEDURE) ×1 IMPLANT
PROTECTION STATION PRESSURIZED (MISCELLANEOUS) ×2
STATION PROTECTION PRESSURIZED (MISCELLANEOUS) IMPLANT

## 2017-11-05 NOTE — Progress Notes (Signed)
Pt transferred via [facility] to [facility] for pericardiocentesis. Will be tranferred to [facility] heart floor after procedure.

## 2017-11-05 NOTE — Progress Notes (Addendum)
PROGRESS NOTE    Wyatt Salas  VOZ:[id number] DOB: [birth year] DOA: 11/12/2017 PCP: [name], MD   Brief Narrative: Wyatt Salas is a 67 y.o. male with a history of alcohol use, seizure disorder, DM-2, hypertension, known lung mass. He presented secondary to a fall, found to be in atrial fibrillation with RVR. 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 Biopsy pending. atrial fib/flutter difficult to control. Cardiology on board. Plan for SNF on discharge.    Assessment & Plan:   Principal Problem:   Atrial fibrillation with rapid ventricular response (HCC) Active Problems:   Seizure disorder (HCC)   Type 2 diabetes mellitus with hyperlipidemia (HCC)   Lung mass   Essential hypertension   Acute urinary retention   Chronic diastolic CHF (congestive heart failure) (HCC)   Closed fracture of distal end of left humerus   Protein-calorie malnutrition, severe   Acute combined systolic and diastolic heart failure (HCC)   Atrial flutter w/ RVR Still with episodes of tachycardia into 130s. CHA2DS2-VASc Score is 3. On personal review of telemetry, tachycardia appears to be flutter -Cardiology recommendations: Cardizem, digoxin, metoprolol  Non-small cell carcinoma Patient is s/p bronchoscopy with bronchial brushing significant for non-small cell carcinoma. -Biopsy pending -Discussed with Oncology on 4/17, [name], who will arrange for either her or [name] to see patient as an outpatient. -Palliative care medicine recommendations: outpatient palliative care  Pericardial effusion 3cm on Transthoracic Echocardiogram. Patient tachycardic in setting of afib/flutter with hypotension in setting of diltiazem drip/metoprolol. ?malignant. Discussed with cardiology. Plan for tap/drain at [facility]. Will transfer patient to [facility].  Essential hypertension Well controlled and slightly low at times. -Per  cardiology as mentioned above  Dysphagia -Dysphagia 3 diet per speech therapy recommendations  Left humerus fracture Secondary to fall. Outpatient follow-up with Dr. [name].  History of alcohol use Out of window for DTs  Deconditioning Frequent falls -PT eval: SNF  Left ankle pain Likely arthritic. Symptomatic management.  Acute urinary retention Continue Flomax. Foley placed on 4/13. Plan for outpatient urology follow-up.  Diabetes mellitus, type 2 -Continue SSI  Seizure disorder -Continue Keppra and Dilantin  Chronic combined systolic and diastolic heart failure EF of 40-45% with grade 2 diastolic dysfunction. Diffuse hypokinesis.  Right chest pain New. Cough present, however, cough is not new. Pain is pleuritic. -Chest x-ray     DVT prophylaxis: Lovenox Code Status:   Code Status: Full Code Family Communication: None at bedside Disposition Plan: Discharge to SNF when medically ready   Consultants:   Pulmonology  Cardiology  Palliative care medicine  Oncology (telepone)  Procedures:   Bronchoscopy (11/10/2017)  Transthoracic Echocardiogram (11/03/17) Study Conclusions - Left ventricle: The cavity size was normal. Wall thickness was normal. Systolic function was mildly to moderately reduced. The estimated ejection fraction was in the range of 40% to 45%. Diffuse hypokinesis. Features are consistent with a pseudonormal left ventricular filling pattern, with concomitant abnormal relaxation and increased filling pressure (grade 2 diastolic dysfunction). - Mitral valve: There was mild regurgitation. - Tricuspid valve: There was trivial regurgitation. - Pericardium, extracardiac: A large pericardial effusion (up to 3cm) was identified circumferential to the heart. The fluid had no internal echoes. No RA or RV collapse. IVC is small in diameter but does not demonstrate respiratory variation. There was no significant evidence of hemodynamic  compromise.  Impressions: - Pericardial effusion is new when compared to prior.   Antimicrobials:  None  Subjective: Right sided chest pain that is worse with inspiration. Cough is productive but has been unchanged in the last two weeks.  Objective: Vitals:   11/07/2017 0814 11/03/2017 0909 10/22/2017 0918 11/16/2017 1147  BP: 108/89 113/70 96/69 103/73  Pulse: (!) 131 (!) 131 78 (!) 109  Resp:    20  Temp:      TempSrc:      SpO2: 97%   97%  Weight:      Height:        Intake/Output Summary (Last 24 hours) at 11/16/2017 1200 Last data filed at 11/01/2017 0900 Gross per 24 hour  Intake 360 ml  Output 1375 ml  Net -1015 ml   Filed Weights   11/16/2017 0646 11/04/17 0500 11/10/2017 0539  Weight: 56.7 kg (125 lb) 57.2 kg (126 lb 1.6 oz) 57.2 kg (126 lb 1.7 oz)    Examination:  General exam: Appears calm and comfortable  Respiratory system: Clear to auscultation. Respiratory effort normal. Cardiovascular system: S1 & S2 heard, Irregular rhythm with fast rate. No murmurs, rubs, gallops or clicks. Gastrointestinal system: Abdomen is nondistended, soft and nontender. Normal bowel sounds heard. Central nervous system: Alert and oriented. No focal neurological deficits. Extremities: No edema. No calf tenderness Skin: No cyanosis. No rashes Psychiatry: Judgement and insight appear normal. Mood & affect appropriate.     Data Reviewed: I have personally reviewed following labs and imaging studies  CBC: Recent Labs  Lab 10/31/17 0422 11/01/17 0506 11/03/17 0425  WBC 9.7 10.4 11.3*  HGB 11.3* 10.6* 11.1*  HCT 36.2* 33.5* 36.1*  MCV 82.3 81.9 81.5  PLT 328 384 811*   Basic Metabolic Panel: Recent Labs  Lab 10/31/17 0422 11/01/17 0506 11/03/17 0425 11/04/17 1301  NA 132* 133* 133* 131*  K 4.6 4.5 4.8 4.2  CL 98* 100* 99* 99*  CO2 24 23 22 22   GLUCOSE 162* 170* 163* 144*  BUN 10 9 9 11   CREATININE 0.56* 0.53* 0.57* 0.49*  CALCIUM 9.0 9.1 9.4 9.0  MG 1.7 1.7 1.8  1.9   GFR: Estimated Creatinine Clearance: 71.5 mL/min (A) (by C-G formula based on SCr of 0.49 mg/dL (L)). Liver Function Tests: Recent Labs  Lab 10/31/17 0422 11/03/17 0425  AST 38 57*  ALT 33 53  ALKPHOS 75 88  BILITOT 0.2* 0.3  PROT 6.0* 7.0  ALBUMIN 2.3* 2.4*   No results for input(s): LIPASE, AMYLASE in the last 168 hours. No results for input(s): AMMONIA in the last 168 hours. Coagulation Profile: No results for input(s): INR, PROTIME in the last 168 hours. Cardiac Enzymes: No results for input(s): CKTOTAL, CKMB, CKMBINDEX, TROPONINI in the last 168 hours. BNP (last 3 results) No results for input(s): PROBNP in the last 8760 hours. HbA1C: No results for input(s): HGBA1C in the last 72 hours. CBG: Recent Labs  Lab 11/04/17 0759 11/04/17 1201 11/04/17 1713 11/04/17 2055 10/25/2017 0744  GLUCAP 196* 203* 155* 139* 165*   Lipid Profile: No results for input(s): CHOL, HDL, LDLCALC, TRIG, CHOLHDL, LDLDIRECT in the last 72 hours. Thyroid Function Tests: No results for input(s): TSH, T4TOTAL, FREET4, T3FREE, THYROIDAB in the last 72 hours. Anemia Panel: No results for input(s): VITAMINB12, FOLATE, FERRITIN, TIBC, IRON, RETICCTPCT in the last 72 hours. Sepsis Labs: No results for input(s): PROCALCITON, LATICACIDVEN in the last 168 hours.  Recent Results (from the past 240 hour(s))  Urine culture     Status: Abnormal   Collection Time: 11/13/2017 11:19 PM  Result Value Ref Range  Status   Specimen Description   Final    URINE, CATHETERIZED Performed at [facility], [place] [address] [place], [place] [address]    Special Requests   Final    NONE Performed at [facility], [place] [address] [place], [place] [address]    Culture (A)  Final    <10,000 COLONIES/mL INSIGNIFICANT GROWTH Performed at [place] [address] [place], [place] [address]    Report Status 10/28/2017 FINAL  Final  MRSA PCR Screening     Status:  None   Collection Time: 10/27/17  9:21 AM  Result Value Ref Range Status   MRSA by PCR NEGATIVE NEGATIVE Final    Comment:        The GeneXpert MRSA Assay (FDA approved for NASAL specimens only), is one component of a comprehensive MRSA colonization surveillance program. It is not intended to diagnose MRSA infection nor to guide or monitor treatment for MRSA infections. Performed at [facility], [place] [address], [place], [place] [address]   Culture, sputum-assessment     Status: None   Collection Time: 10/27/17  3:30 PM  Result Value Ref Range Status   Specimen Description EXPECTORATED SPUTUM  Final   Special Requests NONE  Final   Sputum evaluation   Final    THIS SPECIMEN IS ACCEPTABLE FOR SPUTUM CULTURE Performed at [facility], [place] [address] [place], [place] [address]    Report Status 10/27/2017 FINAL  Final  Culture, respiratory (NON-Expectorated)     Status: None   Collection Time: 10/27/17  3:30 PM  Result Value Ref Range Status   Specimen Description   Final    EXPECTORATED SPUTUM Performed at [facility], [place] [address] [place], [place] [address]    Special Requests   Final    NONE Reflexed from [id number] Performed at [facility], [place] [address], [place], [place] [address]    Gram Stain   Final    FEW WBC PRESENT,BOTH PMN AND MONONUCLEAR MODERATE GRAM POSITIVE COCCI    Culture   Final    Consistent with normal respiratory flora. Performed at [facility], [place] [address] [place], [place] [address]    Report Status 10/30/2017 FINAL  Final  Body fluid culture     Status: None   Collection Time: 10/29/17 10:26 AM  Result Value Ref Range Status   Specimen Description   Final    PLEURAL RIGHT Performed at [place] [address] [place], [place] [address]    Special Requests   Final    NONE Performed at [facility], [place]  [address] [place], [place] [address]    Gram Stain   Final    FEW WBC PRESENT, PREDOMINANTLY MONONUCLEAR NO ORGANISMS SEEN    Culture   Final    NO GROWTH 3 DAYS Performed at [place] [address] [place],  [address]    Report Status 11/01/2017 FINAL  Final         Radiology Studies: No results found.      Scheduled Meds: . amiodarone  400 mg Oral BID  . aspirin  81 mg Oral Daily  . atorvastatin  40 mg Oral q1800  . benzonatate  200 mg Oral BID  . docusate sodium  200 mg Oral BID  . enoxaparin (LOVENOX) injection  40 mg Subcutaneous Q24H  . feeding supplement (ENSURE ENLIVE)  237 mL Oral BID BM  . folic acid  1 mg Oral Daily  . insulin aspart  0-5 Units Subcutaneous QHS  . insulin aspart  0-9 Units Subcutaneous TID WC  . lacosamide  100 mg Oral BID  . levETIRAcetam  1,500 mg Oral BID  . metoprolol tartrate  50 mg Oral Q8H  . multivitamin with minerals  1 tablet Oral Daily  . phenytoin  30 mg Oral BID  . sodium chloride flush  3 mL Intravenous Q12H  . tamsulosin  0.4 mg Oral Daily  . thiamine  100 mg Oral Daily   Continuous Infusions: . digoxin (LANOXIN) tablet 0.1875 mg    . diltiazem (CARDIZEM) infusion 7.5 mg/hr (10/29/2017 1038)     LOS: 9 days     Cordelia Poche, MD [facility] 10/29/2017, 12:00 PM Pager: [phone]  If 7PM-7AM, please contact night-coverage [email or web address] Password [email or web address] 10/19/2017, 12:00 PM

## 2017-11-05 NOTE — Progress Notes (Signed)
  Speech Language Pathology Treatment: Dysphagia  Patient Details Name: Wyatt Salas MRN: [id number] DOB: 09/24/1950 Today's Date: 10/30/2017 Time: 1062-6948 SLP Time Calculation (min) (ACUTE ONLY): 10 min  Assessment / Plan / Recommendation Clinical Impression  F/u after Wednesday's MBS.  Pt observed with adequate toleration, continues with ongoing cough, but this is unrelated to swallow per MBS.  Voice quite hoarse.  Protecting airway when eating and pt reports no difficulty.  Recommend continuing soft diet - easier to manage per pt - thin liquids.  No further SLP f/u warranted.  Our services will sign off.   HPI HPI: 67 yo male adm to [facility] with AMS - Pt found to have right lung mass, dx non-small cell carcinoma, TBI, SDH, ETOH use, seizures, essentially wheelchair bound - s/p falls with left humerus fx and ankle injury.  Pt is s/p thoracentesis 10/29/17 and bronchoscopy 10/31/2017 with findings abnormal cells.  Swallow eval ordered.   Imaging studies showed right cerebral encephalomalacia and severe cerebellar atrophy.   Pt underwent BSE and was found to be high aspiration risk, MBS ordered.        SLP Plan  All goals met       Recommendations  Diet recommendations: Dysphagia 3 (mechanical soft);Thin liquid Liquids provided via: Cup;Straw Compensations: Minimize environmental distractions;Slow rate;Small sips/bites                Oral Care Recommendations: Oral care BID Follow up Recommendations: None SLP Visit Diagnosis: Dysphagia, oral phase (R13.11) Plan: All goals met       GO                Wyatt Salas 11/16/2017, 1:32 PM

## 2017-11-05 NOTE — Progress Notes (Signed)
Oncology brief note   Oncology referral was made by Dr. Lonny Prude  on 11/03/17 when I was on call. I have reviewed his chart, and discussed with my partner [name] who is our main thoracic medical oncologist. He will see pt after his discharge. Navigator [name] will arrange his consult appointment. Thanks for the referral.   Truitt Merle  11/16/2017

## 2017-11-05 NOTE — Progress Notes (Signed)
Occupational Therapy Treatment Patient Details Name: Wyatt Salas MRN: [id number] DOB: 17-Jan-1951 Today's Date: 11/14/2017    History of present illness pt was admitted for A Fib with RVR.  PMH:  TBI, ETOH, DM, crani.  Had L distal elbow fx last month   OT comments  Increased edema L hand today.  Educated pt on edema management; will hang a sign for positioning when I return to reapply splint.  Worked on [employer] also; pt limited by LUE limited flexion  Follow Up Recommendations  SNF    Equipment Recommendations  3 in 1 bedside commode    Recommendations for Other Services      Precautions / Restrictions Precautions Precautions: Fall Precaution Comments: Per Dr [name], ROM OK to tolerance, splint just removed; follow up xray pending, WBAT, monitor BP- orthostatic Other Brace/Splint: L wrist splint Restrictions Weight Bearing Restrictions: No LUE Weight Bearing: Weight bearing as tolerated       Mobility Bed Mobility               General bed mobility comments: in recliner  Transfers                      Balance                                           ADL either performed or assessed with clinical judgement   ADL       Grooming: Minimal assistance;Sitting   Upper Body Bathing: Minimal assistance;Sitting       Upper Body Dressing : Minimal assistance;Sitting                     General ADL Comments: UB adls limited by decreased elbow flexion of LUE.  He is able to lift LUE to apply deodorant     Vision       Perception     Praxis      Cognition Arousal/Alertness: Awake/alert Behavior During Therapy: WFL for tasks assessed/performed Overall Cognitive Status: No family/caregiver present to determine baseline cognitive functioning                                 General Comments: mostly wfl        Exercises Other Exercises Other Exercises: pt with increased edema today.  Removed splint as he  had indentations from pressure but no soreness.  Performed retrograde massage and positioned arm up on ramp made of pillows/towels. Pt performed AROM also   Shoulder Instructions       General Comments      Pertinent Vitals/ Pain       Pain Score: 5  Pain Location: L ue Pain Descriptors / Indicators: Aching Pain Intervention(s): Limited activity within patient's tolerance;Monitored during session;Repositioned  Home Living                                          Prior Functioning/Environment              Frequency           Progress Toward Goals  OT Goals(current goals can now be found in the care plan section)  Progress towards OT goals: Progressing  toward goals     Plan      Co-evaluation                 AM-PAC PT "6 Clicks" Daily Activity     Outcome Measure   Help from another person eating meals?: A Little Help from another person taking care of personal grooming?: A Little Help from another person toileting, which includes using toliet, bedpan, or urinal?: A Little Help from another person bathing (including washing, rinsing, drying)?: A Little Help from another person to put on and taking off regular upper body clothing?: A Little Help from another person to put on and taking off regular lower body clothing?: A Little 6 Click Score: 18    End of Session    OT Visit Diagnosis: Muscle weakness (generalized) (M62.81);Pain Pain - Right/Left: Left Pain - part of body: Arm   Activity Tolerance Patient tolerated treatment well   Patient Left in bed;with call bell/phone within reach;with bed alarm set   Nurse Communication          Time: 6659-9357 OT Time Calculation (min): 11 min  Charges: OT General Charges $OT Visit: 1 Visit OT Treatments $Self Care/Home Management : 8-22 mins  [name], OTR/L [phone] 10/20/2017   Seanna Sisler 11/07/2017, 11:53 AM

## 2017-11-05 NOTE — Progress Notes (Addendum)
Discussed with Dr. Oval Linsey, Johnsie Cancel and Bensimhon pt's pericardial effusion --Dr. Johnsie Cancel has recommended bringing to [facility] for pericardiocentesis.  Pt had BK but only ensure for Lunch.  Discussed with Pt and he understands risks left not treated and treated.  He is willing to proceed.       Also talked to pt's sister and plan to bring to [facility] for procedure.   She was glad for the call.

## 2017-11-05 NOTE — Progress Notes (Signed)
Progress Note  Patient Name: Wyatt Salas Date of Encounter: 10/23/2017  Primary Cardiologist: Glenetta Hew, MD   Subjective   Complains of Lt side pain from fall, no chest pain.  Has some awareness of rapid HR.    Inpatient Medications    Scheduled Meds: . aspirin  81 mg Oral Daily  . atorvastatin  40 mg Oral q1800  . benzonatate  200 mg Oral BID  . docusate sodium  200 mg Oral BID  . enoxaparin (LOVENOX) injection  40 mg Subcutaneous Q24H  . feeding supplement (ENSURE ENLIVE)  237 mL Oral BID BM  . folic acid  1 mg Oral Daily  . insulin aspart  0-5 Units Subcutaneous QHS  . insulin aspart  0-9 Units Subcutaneous TID WC  . lacosamide  100 mg Oral BID  . levETIRAcetam  1,500 mg Oral BID  . metoprolol tartrate  50 mg Oral Q8H  . multivitamin with minerals  1 tablet Oral Daily  . phenytoin  30 mg Oral BID  . sodium chloride flush  3 mL Intravenous Q12H  . tamsulosin  0.4 mg Oral Daily  . thiamine  100 mg Oral Daily   Continuous Infusions: . digoxin (LANOXIN) tablet 0.1875 mg    . diltiazem (CARDIZEM) infusion 7.5 mg/hr (11/10/2017 0808)   PRN Meds: acetaminophen **OR** acetaminophen, diltiazem, guaiFENesin-dextromethorphan, HYDROcodone-acetaminophen, ondansetron **OR** ondansetron (ZOFRAN) IV, phenol, polyethylene glycol, polyvinyl alcohol   Vital Signs    Vitals:   10/20/2017 0807 11/07/2017 0814 10/22/2017 0909 10/20/2017 0918  BP: 102/77 108/89 113/70 96/69  Pulse: (!) 127 (!) 131 (!) 131 78  Resp:      Temp:      TempSrc:      SpO2: 97% 97%    Weight:      Height:        Intake/Output Summary (Last 24 hours) at 11/11/2017 0927 Last data filed at 10/31/2017 0900 Gross per 24 hour  Intake 360 ml  Output 1375 ml  Net -1015 ml   Filed Weights   10/19/2017 0646 11/04/17 0500 10/30/2017 0539  Weight: 125 lb (56.7 kg) 126 lb 1.6 oz (57.2 kg) 126 lb 1.7 oz (57.2 kg)    Telemetry    A fib with RVR at times to 150, other times 100-110.  - Personally Reviewed  ECG     No new - Personally Reviewed  Physical Exam   GEN: No acute distress, frail male   Neck: No JVD sitting up in chair Cardiac: irreg irreg, no murmurs, rubs, or gallops.  Respiratory: breath sounds to auscultation bilaterally occ wheezes. GI: Soft, nontender, non-distended  MS: No edema; No deformity., brace on Lt arm Neuro:  Nonfocal  Psych: Normal affect   Labs    Chemistry Recent Labs  Lab 10/29/17 1046  10/31/17 0422 11/01/17 0506 11/03/17 0425 11/04/17 1301  NA  --    < > 132* 133* 133* 131*  K  --    < > 4.6 4.5 4.8 4.2  CL  --    < > 98* 100* 99* 99*  CO2  --    < > 24 23 22 22   GLUCOSE  --    < > 162* 170* 163* 144*  BUN  --    < > 10 9 9 11   CREATININE  --    < > 0.56* 0.53* 0.57* 0.49*  CALCIUM  --    < > 9.0 9.1 9.4 9.0  PROT 5.9*  --  6.0*  --  7.0  --   ALBUMIN  --   --  2.3*  --  2.4*  --   AST  --   --  38  --  57*  --   ALT  --   --  33  --  53  --   ALKPHOS  --   --  75  --  88  --   BILITOT  --   --  0.2*  --  0.3  --   GFRNONAA  --    < > >60 >60 >60 >60  GFRAA  --    < > >60 >60 >60 >60  ANIONGAP  --    < > 10 10 12 10    < > = values in this interval not displayed.     Hematology Recent Labs  Lab 10/31/17 0422 11/01/17 0506 11/03/17 0425  WBC 9.7 10.4 11.3*  RBC 4.40 4.09* 4.43  HGB 11.3* 10.6* 11.1*  HCT 36.2* 33.5* 36.1*  MCV 82.3 81.9 81.5  MCH 25.7* 25.9* 25.1*  MCHC 31.2 31.6 30.7  RDW 15.2 15.2 15.0  PLT 328 384 462*    Cardiac EnzymesNo results for input(s): TROPONINI in the last 168 hours. No results for input(s): TROPIPOC in the last 168 hours.   BNPNo results for input(s): BNP, PROBNP in the last 168 hours.   DDimer No results for input(s): DDIMER in the last 168 hours.   Radiology    See chart  Cardiac Studies   Echo 11/03/17: Study Conclusions - Left ventricle: The cavity size was normal. Wall thickness was normal. Systolic function was mildly to moderately reduced. The estimated ejection fraction was in  the range of 40% to 45%. Diffuse hypokinesis. Features are consistent with a pseudonormal left ventricular filling pattern, with concomitant abnormal relaxation and increased filling pressure (grade 2 diastolic dysfunction). - Mitral valve: There was mild regurgitation. - Tricuspid valve: There was trivial regurgitation. - Pericardium, extracardiac: A large pericardial effusion (up to 3cm) was identified circumferential to the heart. The fluid had no internal echoes. No RA or RV collapse. IVC is small in diameter but does not demonstrate respiratory variation. There was no significant evidence of hemodynamic compromise.  Impressions: - Pericardial effusion is new when compared to prior.      Patient Profile     67 y.o. male with a hx of high fall risk with multiple falls in the past 2 months with multiple fractures, wheel chair dependent, remote hx of subdural hematoma s/p right sided craniotomy (?2000), seizure disorder, alcohol abuse, chronic hep C, HTN, HDL, chronic diastolic heart failure, and lung mass concerning for cancernow with a fib RVR found on admit for fall.     Assessment & Plan    A flutter - loaded with dig and will need dig level Sunday AM the 21st.  --rate this AM in the 127-`130 range, dilt drip increased.  On metoprolol 50 BID and dig 0.1875 daily  --BP borderline 94/72 to 108/89  --CHA2DS2-VASc Score 4 (CHF, HTN, DM, age). However, patient is not an anticoagulation candidate given his multiple falls with fractures and hx of subdural hematoma   NON-small cell carcinoma  --oncology to see as outpt.  Pericardial Effusion ? Related to cancer   Chronic combined systolic and diastolic HF  --EF 80-99% I3JA, diffuse hypokinesis --neg 5,288 this admit though wt is stable.  Not on diuretic   For questions or updates, please contact [place] Please consult [email or web address] for contact info under Cardiology/STEMI.  Signed, [place]ie Kicks, NP  11/04/2017, 9:28 AM

## 2017-11-05 NOTE — Progress Notes (Signed)
HR 127-130's BP 102/77. Diltiazem drip increased to 7.5.  MD notified via text page.

## 2017-11-06 ENCOUNTER — Inpatient Hospital Stay (HOSPITAL_COMMUNITY): Payer: Medicare HMO

## 2017-11-06 DIAGNOSIS — I318 Other specified diseases of pericardium: Secondary | ICD-10-CM

## 2017-11-06 DIAGNOSIS — Z7189 Other specified counseling: Secondary | ICD-10-CM

## 2017-11-06 DIAGNOSIS — Z515 Encounter for palliative care: Secondary | ICD-10-CM

## 2017-11-06 LAB — BASIC METABOLIC PANEL
Anion gap: 10 (ref 5–15)
BUN: 6 mg/dL (ref 6–20)
CALCIUM: 8.8 mg/dL — AB (ref 8.9–10.3)
CHLORIDE: 100 mmol/L — AB (ref 101–111)
CO2: 21 mmol/L — ABNORMAL LOW (ref 22–32)
CREATININE: 0.51 mg/dL — AB (ref 0.61–1.24)
GFR calc non Af Amer: >60 mL/min (ref >60)
Glucose, Bld: 161 mg/dL — ABNORMAL HIGH (ref 65–99)
Potassium: 4.2 mmol/L (ref 3.5–5.1)
SODIUM: 131 mmol/L — AB (ref 135–145)

## 2017-11-06 LAB — PH, BODY FLUID: pH, Body Fluid: 7.5

## 2017-11-06 LAB — GLUCOSE, BODY FLUID OTHER: GLUCOSE, BODY FLUID OTHER: 104 mg/dL

## 2017-11-06 LAB — CBC
HCT: 33 % — ABNORMAL LOW (ref 39.0–52.0)
Hemoglobin: 10.6 g/dL — ABNORMAL LOW (ref 13.0–17.0)
MCH: 25.9 pg — ABNORMAL LOW (ref 26.0–34.0)
MCHC: 32.1 g/dL (ref 30.0–36.0)
MCV: 80.5 fL (ref 78.0–100.0)
PLATELETS: 471 10*3/uL — AB (ref 150–400)
RBC: 4.1 MIL/uL — AB (ref 4.22–5.81)
RDW: 15 % (ref 11.5–15.5)
WBC: 7.6 10*3/uL (ref 4.0–10.5)

## 2017-11-06 LAB — ECHOCARDIOGRAM LIMITED
Height: 73 in
WEIGHTICAEL: 1996.49 [oz_av]

## 2017-11-06 LAB — PROTEIN, BODY FLUID (OTHER): Total Protein, Body Fluid Other: 4 g/dL

## 2017-11-06 LAB — GLUCOSE, CAPILLARY: GLUCOSE-CAPILLARY: 171 mg/dL — AB (ref 65–99)

## 2017-11-06 LAB — LD, BODY FLUID (OTHER): LD, Body Fluid: 1518 IU/L

## 2017-11-06 NOTE — Progress Notes (Signed)
Daily Progress Note   Patient Name: Wyatt Salas       Date: 11/06/2017 DOB: 18-Jan-1951  Age: 67 y.o. MRN#: [id number] Attending Physician: Mariel Aloe, MD Primary Care Physician: [name], MD Admit Date: 11/01/2017  Reason for Consultation/Follow-up: Establishing goals of care and Psychosocial/spiritual support  Subjective: Patient seen, chart reviewed.  Patient transferred from [facility] for peri-cardiocentesis procedure.  Wyatt Salas, has had a new diagnosis of non-small cell carcinoma.  Plan at this point is to be followed with oncology services on an outpatient basis; likely transfer to skilled nursing facility in the next 24-48 hours if he continues to stabilize.  Palliative medicine team has been working with Wyatt Salas through this process of finding a lung mass, biopsy, now confirmation that he does have cancer.  He has been avoiding conversations regarding healthcare decisions with our providers.  He does today talk about cancer, but in terms of " I've got to get this thing out of me"    Length of Stay: 10  Current Medications: Scheduled Meds:  . amiodarone  400 mg Oral BID  . aspirin  81 mg Oral Daily  . atorvastatin  40 mg Oral q1800  . benzonatate  200 mg Oral BID  . docusate sodium  200 mg Oral BID  . feeding supplement (ENSURE ENLIVE)  237 mL Oral BID BM  . folic acid  1 mg Oral Daily  . heparin  5,000 Units Subcutaneous Q8H  . insulin aspart  0-5 Units Subcutaneous QHS  . insulin aspart  0-9 Units Subcutaneous TID WC  . lacosamide  100 mg Oral BID  . levETIRAcetam  1,500 mg Oral BID  . metoprolol tartrate  50 mg Oral Q8H  . multivitamin with minerals  1 tablet Oral Daily  . phenytoin  30 mg Oral BID  . sodium chloride flush  3 mL Intravenous  Q12H  . tamsulosin  0.4 mg Oral Daily  . thiamine  100 mg Oral Daily    Continuous Infusions: . sodium chloride    . sodium chloride 10 mL/hr at 11/06/17 0700  . digoxin (LANOXIN) tablet 0.1875 mg    . diltiazem (CARDIZEM) infusion Stopped (11/06/17 0938)    PRN Meds: sodium chloride, acetaminophen, diltiazem, guaiFENesin-dextromethorphan, HYDROcodone-acetaminophen, ondansetron **OR** ondansetron (ZOFRAN) IV, phenol, polyethylene glycol, polyvinyl  alcohol, sodium chloride flush  Physical Exam  Constitutional:  Frail older man seen in cardiac ICU.  He appears short of breath, cough  HENT:  Temporal wasting  Neck: Normal range of motion.  Cardiovascular: Normal rate.  Pulmonary/Chest:  Increased work of breathing noted at rest, with conversation, dry cough  Musculoskeletal: Normal range of motion.  Neurological: He is alert.  Skin: Skin is warm and dry.  Psychiatric: He has a normal mood and affect. His behavior is normal. Thought content normal.  Nursing note and vitals reviewed.           Vital Signs: BP 91/70   Pulse (!) 0   Temp 98.3 F (36.8 C) (Oral)   Resp 19   Ht 6\' 1"  (1.854 m)   Wt 56.6 kg (124 lb 12.5 oz)   SpO2 97%   BMI 16.46 kg/m  SpO2: SpO2: 97 % O2 Device: O2 Device: Room Air O2 Flow Rate: O2 Flow Rate (L/min): 2 L/min  Intake/output summary:   Intake/Output Summary (Last 24 hours) at 11/06/2017 1116 Last data filed at 11/06/2017 0700 Gross per 24 hour  Intake 2299.96 ml  Output 975 ml  Net 1324.96 ml   LBM: Last BM Date: 11/04/17 Baseline Weight: Weight: 68 kg (150 lb) Most recent weight: Weight: 56.6 kg (124 lb 12.5 oz)       Palliative Assessment/Data:    Flowsheet Rows     Most Recent Value  Intake Tab  Referral Department  Hospitalist  Unit at Time of Referral  Med/Surg Unit  Palliative Care Primary Diagnosis  Cancer  Date Notified  10/28/17  Palliative Care Type  New Palliative care  Reason for referral  Clarify Goals of Care    Date of Admission  10/24/2017  Date first seen by Palliative Care  10/29/17  # of days Palliative referral response time  1 Day(s)  # of days IP prior to Palliative referral  2  Clinical Assessment  Palliative Performance Scale Score  60%  Pain Max last 24 hours  6  Pain Min Last 24 hours  0  Psychosocial & Spiritual Assessment  Palliative Care Outcomes  Patient/Family meeting held?  Yes  Who was at the meeting?  patient  Palliative Care Outcomes  Clarified goals of care      Patient Active Problem List   Diagnosis Date Noted  . Cardiac/pericardial tamponade   . Acute combined systolic and diastolic heart failure ([place])   . Protein-calorie malnutrition, severe 10/28/2017  . Acute urinary retention 10/27/2017  . Atrial fibrillation with rapid ventricular response ([place]) 10/27/2017  . Chronic diastolic CHF (congestive heart failure) ([place]) 10/27/2017  . Closed fracture of distal end of left humerus 10/27/2017  . Mass of upper lobe of right lung   . New onset atrial fibrillation ([place])   . Abnormal liver function   . Lung mass   . Essential hypertension   . Altered mental status 05/18/2017  . Right sided weakness 10/14/2016  . Acute urinary tract infection 10/14/2016  . Elevated troponin 10/14/2016  . Type 2 diabetes mellitus with hyperlipidemia ([place]) 10/14/2016  . Fall at home--mechanical 01/26/2014  . Elevated CK 01/26/2014  . Seizure disorder ([place]) 07/26/2013  . NSTEMI (non-ST elevated myocardial infarction) ([place]) 07/26/2013  . Hyperlipidemia 07/26/2013  . Aspiration pneumonia ([place]) 07/26/2013  . 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 fib with RVR.  Await results of cytology for decision regarding possible need for bronchoscopy.  Wyatt Salas has undergone bronchoscopy and unfortunately was found to have non-small cell lung cancer.  On 10/29/2017, he underwent pericardiocentesis at [facility]. He is now un 2H/cardiac ICU.  Consult ordered for goals of care   Assessment: Wyatt Salas was able to verbalize today discussions regarding cancer but he still continues to deflect any in-depth conversation about healthcare decisions going forward such as CODE STATUS.  His source of support he verbalizes is his relationship with God, states that he is turning it over to God, "too blessed to be stressed".  Recommendations/Plan:  Palliative medicine will be glad to continue to be a source of support as well as engage patient as he becomes more open to goals of care conversations  This likely though, will need further goals of care discussions after his discharge.  He is going to be affiliated with the oncology center through [facility].  The oncology department has an embedded palliative medicine provider that sees clients on Mondays.  Another option would be the palliative medicine division of [facility] which can be reached at [phone]; or [facility], a division of [facility], [phone]  Wyatt Salas would qualify for his hospice benefit.  At this point however he is not open to discussing issues around end-of-life, code status. Wyatt Salas is also affiliated with the [facility], who could be another source of support as this goes forward  Goals of Care and Additional Recommendations:  Limitations on Scope of Treatment: Full Scope Treatment  Code Status:    Code Status Orders  (From admission, onward)        Start     Ordered   10/27/17 0548  Full code  Continuous     10/27/17 0549    Code Status History    Date Active Date Inactive Code Status Order ID  Comments User Context   05/18/2017 2330 05/24/2017 2201 Full Code [id number]  Jani Gravel, MD Inpatient   10/14/2016 2118 10/15/2016 1921 Full Code [id number]  Toy Baker, MD Inpatient   01/25/2014 2010 01/26/2014 2035 Full Code [id number]  Corky Sox, MD Inpatient   07/27/2013 0019 07/31/2013 1533 Full Code [id number]  Orvan Falconer, MD Inpatient       Prognosis:   < 6 months in the setting of newly diagnosed non-small cell lung cancer; protein calorie malnutrition with an albumin of 2.4, BMI 16.46  Discharge Planning:  [place] for rehab with Palliative care service follow-up  Care plan was discussed with Dr. Rowe Pavy  Thank you for allowing the Palliative Medicine Team to assist in the care of this patient.   Time In: 1100 Time Out: 1130 Total Time 30 min Prolonged Time Billed  no       Greater than 50%  of this time was spent counseling and coordinating care related to the above assessment and plan.  Dory Horn, NP  Please contact Palliative Medicine Team phone at [phone] for questions and concerns.

## 2017-11-06 NOTE — Progress Notes (Signed)
Spoke with Dr.Nettey d/t concerns that patient has only had 225 ml out in foley since 0600, which is less than 20 ml's an hour. Also reported that HR is back up to 130 but is ST. Patient received IV fluids yesterday after cath and his HR this morning was 80's-90's and was able to come off Cardizem drip. Patient has a very poor appetite, eats less than 25% of each meal. Drinks 25%-100% of Ensures. Will encourage patient to drink more per Dr.Nettey. Expressed concerned for dehydration and asked to start IV fluids or give a small bolus, but d/t his EF of 40%, Dr. Lonny Prude advised that we wait 24 hours.  Will continue to monitor and restart Cardizem drip.

## 2017-11-06 NOTE — Progress Notes (Signed)
PROGRESS NOTE    Wyatt Salas  HCW:[id number] DOB: [birth year] DOA: 10/20/2017 PCP: [name], MD   Brief Narrative: Wyatt Salas is a 67 y.o. male with a history of alcohol use, seizure disorder, DM-2, hypertension, known lung mass. He presented secondary to a fall, found to be in atrial fibrillation with RVR. 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 Biopsy pending. atrial fib/flutter difficult to control. Cardiology on board.  S/p pericardiocentesis on 4/19. Plan for SNF on discharge.    Assessment & Plan:   Principal Problem:   Atrial fibrillation with rapid ventricular response (HCC) Active Problems:   Seizure disorder (HCC)   Type 2 diabetes mellitus with hyperlipidemia (HCC)   Lung mass   Essential hypertension   Acute urinary retention   Chronic diastolic CHF (congestive heart failure) (HCC)   Closed fracture of distal end of left humerus   Protein-calorie malnutrition, severe   Acute combined systolic and diastolic heart failure (HCC)   Cardiac/pericardial tamponade   Atrial flutter w/ RVR Still with episodes of tachycardia into 130s. CHA2DS2-VASc Score is 3. -Cardiology recommendations: Cardizem, digoxin, metoprolol, added amiodarone  Non-small cell carcinoma Patient is s/p bronchoscopy with bronchial brushing significant for non-small cell carcinoma. Biopsy confirms carcinoma. Pleural effusion was transudative with atypical cells. -Discussed with Oncology on 4/17, [name], who will arrange for either her or [name] to see patient as an outpatient. -Palliative care medicine recommendations: outpatient palliative care  Pericardial effusion 3cm on Transthoracic Echocardiogram. Patient tachycardic in setting of afib/flutter with hypotension in setting of diltiazem drip/metoprolol. ?malignant. S/p pericardiocentesis on 4/19. -Cytology/culture/labs pending  Essential hypertension Well  controlled and slightly low at times. -Per cardiology as mentioned above  Dysphagia -Dysphagia 3 diet per speech therapy recommendations  Left humerus fracture Secondary to fall. Outpatient follow-up with Dr. [name].  History of alcohol use Out of window for DTs  Deconditioning Frequent falls -PT eval: SNF  Left ankle pain Likely arthritic. Symptomatic management.  Acute urinary retention Continue Flomax. Foley placed on 4/13. Plan for outpatient urology follow-up.  Diabetes mellitus, type 2 -Continue SSI  Seizure disorder -Continue Keppra and Dilantin  Chronic combined systolic and diastolic heart failure EF of 40-45% with grade 2 diastolic dysfunction. Diffuse hypokinesis.  Right chest pain New. Cough present, however, cough is not new. Pain is pleuritic. -Chest x-ray     DVT prophylaxis: Lovenox Code Status:   Code Status: Full Code Family Communication: None at bedside Disposition Plan: Discharge to SNF in 24-48 hours after removal of drain and control of atrial flutter/fib   Consultants:   Pulmonology  Cardiology  Palliative care medicine  Oncology (telepone)  Procedures:   Bronchoscopy (10/25/2017)  Transthoracic Echocardiogram (11/03/17) Study Conclusions - Left ventricle: The cavity size was normal. Wall thickness was normal. Systolic function was mildly to moderately reduced. The estimated ejection fraction was in the range of 40% to 45%. Diffuse hypokinesis. Features are consistent with a pseudonormal left ventricular filling pattern, with concomitant abnormal relaxation and increased filling pressure (grade 2 diastolic dysfunction). - Mitral valve: There was mild regurgitation. - Tricuspid valve: There was trivial regurgitation. - Pericardium, extracardiac: A large pericardial effusion (up to 3cm) was identified circumferential to the heart. The fluid had no internal echoes. No RA or RV collapse. IVC is small in diameter but  does not demonstrate respiratory variation. There was no significant evidence of hemodynamic compromise.  Impressions: - Pericardial effusion is new when compared to  prior.  Pericardiocentesis (4/19)   Antimicrobials:  None    Subjective: Feels better today.  Objective: Vitals:   11/06/17 0412 11/06/17 0500 11/06/17 0600 11/06/17 0700  BP:  97/63 104/68 91/70  Pulse:      Resp:  14 14 19   Temp: (!) 97.5 F (36.4 C)     TempSrc: Oral     SpO2:  96% 98% 97%  Weight:  56.6 kg (124 lb 12.5 oz)    Height:        Intake/Output Summary (Last 24 hours) at 11/06/2017 0750 Last data filed at 11/06/2017 0700 Gross per 24 hour  Intake 2539.96 ml  Output 975 ml  Net 1564.96 ml   Filed Weights   11/04/17 0500 11/07/2017 0539 11/06/17 0500  Weight: 57.2 kg (126 lb 1.6 oz) 57.2 kg (126 lb 1.7 oz) 56.6 kg (124 lb 12.5 oz)    Examination:  General: Well appearing, no distress Respiratory system: Clear to auscultation, diminished. Respiratory effort normal. Cardiovascular system: S1 & S2 heard, regular rhythm and normal rate. No murmurs, rubs, gallops or clicks.  Gastrointestinal system: Soft, non-tender, non-distended, no guarding, no rebound, no masses felt Central nervous system: Alert and oriented. No focal neurological deficits. Extremities: No edema. No calf tenderness Skin: No cyanosis. No rashes Psychiatry: Judgement and insight appear normal. Mood & affect appropriate.     Data Reviewed: I have personally reviewed following labs and imaging studies  CBC: Recent Labs  Lab 10/31/17 0422 11/01/17 0506 11/03/17 0425 10/23/2017 1852 11/06/17 0301  WBC 9.7 10.4 11.3* 9.4 7.6  HGB 11.3* 10.6* 11.1* 10.8* 10.6*  HCT 36.2* 33.5* 36.1* 33.2* 33.0*  MCV 82.3 81.9 81.5 80.2 80.5  PLT 328 384 462* 441* 161*   Basic Metabolic Panel: Recent Labs  Lab 10/31/17 0422 11/01/17 0506 11/03/17 0425 11/04/17 1301 11/03/2017 1852 11/06/17 0301  NA 132* 133* 133* 131*  --   131*  K 4.6 4.5 4.8 4.2  --  4.2  CL 98* 100* 99* 99*  --  100*  CO2 24 23 22 22   --  21*  GLUCOSE 162* 170* 163* 144*  --  161*  BUN 10 9 9 11   --  6  CREATININE 0.56* 0.53* 0.57* 0.49* 0.55* 0.51*  CALCIUM 9.0 9.1 9.4 9.0  --  8.8*  MG 1.7 1.7 1.8 1.9  --   --    GFR: Estimated Creatinine Clearance: 70.8 mL/min (A) (by C-G formula based on SCr of 0.51 mg/dL (L)). Liver Function Tests: Recent Labs  Lab 10/31/17 0422 11/03/17 0425  AST 38 57*  ALT 33 53  ALKPHOS 75 88  BILITOT 0.2* 0.3  PROT 6.0* 7.0  ALBUMIN 2.3* 2.4*   No results for input(s): LIPASE, AMYLASE in the last 168 hours. No results for input(s): AMMONIA in the last 168 hours. Coagulation Profile: No results for input(s): INR, PROTIME in the last 168 hours. Cardiac Enzymes: No results for input(s): CKTOTAL, CKMB, CKMBINDEX, TROPONINI in the last 168 hours. BNP (last 3 results) No results for input(s): PROBNP in the last 8760 hours. HbA1C: No results for input(s): HGBA1C in the last 72 hours. CBG: Recent Labs  Lab 11/01/2017 0744 10/29/2017 1237 10/25/2017 1834 10/19/2017 2249 11/06/17 0728  GLUCAP 165* 218* 114* 203* 171*   Lipid Profile: No results for input(s): CHOL, HDL, LDLCALC, TRIG, CHOLHDL, LDLDIRECT in the last 72 hours. Thyroid Function Tests: No results for input(s): TSH, T4TOTAL, FREET4, T3FREE, THYROIDAB in the last 72 hours. Anemia Panel: No  results for input(s): VITAMINB12, FOLATE, FERRITIN, TIBC, IRON, RETICCTPCT in the last 72 hours. Sepsis Labs: No results for input(s): PROCALCITON, LATICACIDVEN in the last 168 hours.  Recent Results (from the past 240 hour(s))  MRSA PCR Screening     Status: None   Collection Time: 10/27/17  9:21 AM  Result Value Ref Range Status   MRSA by PCR NEGATIVE NEGATIVE Final    Comment:        The GeneXpert MRSA Assay (FDA approved for NASAL specimens only), is one component of a comprehensive MRSA colonization surveillance program. It is not intended to  diagnose MRSA infection nor to guide or monitor treatment for MRSA infections. Performed at [facility] [place] [address] [place], [place] [address]   Culture, sputum-assessment     Status: None   Collection Time: 10/27/17  3:30 PM  Result Value Ref Range Status   Specimen Description EXPECTORATED SPUTUM  Final   Special Requests NONE  Final   Sputum evaluation   Final    THIS SPECIMEN IS ACCEPTABLE FOR SPUTUM CULTURE Performed at [facility], [place] [address] [place], [place] [address]    Report Status 10/27/2017 FINAL  Final  Culture, respiratory (NON-Expectorated)     Status: None   Collection Time: 10/27/17  3:30 PM  Result Value Ref Range Status   Specimen Description   Final    EXPECTORATED SPUTUM Performed at [facility], [place] [address] [place], [place] [address]    Special Requests   Final    NONE Reflexed from [id number] Performed at [facility], [place] [address], [place], [place] [address]    Gram Stain   Final    FEW WBC PRESENT,BOTH PMN AND MONONUCLEAR MODERATE GRAM POSITIVE COCCI    Culture   Final    Consistent with normal respiratory flora. Performed at [facility], [place] [address], [place], [place] [address]    Report Status 10/30/2017 FINAL  Final  Body fluid culture     Status: None   Collection Time: 10/29/17 10:26 AM  Result Value Ref Range Status   Specimen Description   Final    PLEURAL RIGHT Performed at [place] [address], [place], [place] [address]    Special Requests   Final    NONE Performed at [facility], [place] [address] [place], [place] [address]    Gram Stain   Final    FEW WBC PRESENT, PREDOMINANTLY MONONUCLEAR NO ORGANISMS SEEN    Culture   Final    NO GROWTH 3 DAYS Performed at [place] [address] [place],  [address]    Report Status 11/01/2017 FINAL  Final  Body fluid culture      Status: None (Preliminary result)   Collection Time: 11/08/2017  4:33 PM  Result Value Ref Range Status   Specimen Description PERICARDIAL [facility]  Final   Special Requests NONE  Final   Gram Stain   Final    FEW WBC PRESENT, PREDOMINANTLY PMN NO ORGANISMS SEEN Performed at [facility], [place] [address], [place],  [address]    Culture PENDING  Incomplete   Report Status PENDING  Incomplete         Radiology Studies: Dg Chest Port 1 View  Result Date: 11/15/2017 CLINICAL DATA:  67 year old male with a history of cough and shortness of breath EXAM: PORTABLE CHEST 1 VIEW COMPARISON:  Multiple prior chest  x-rays most recent 11/01/2017, 10/29/2017, 10/27/2017, prior chest CT 10/19/2017 FINDINGS: Cardiomediastinal silhouette unchanged in size and contour, with heart borders partially obscured by lung/pleural disease. Significant right rotation of the patient somewhat limits evaluation, however, there is increasing opacity at the right lung with mixed interstitial, interlobular septal opacities, and airspace opacity with evidence of pleural effusion. Increasing opacity along the upper right mediastinal border/paratracheal border. No pneumothorax. Left lung remains relatively well aerated. IMPRESSION: Increasing opacity of the right lung, likely a combination of airspace opacity/consolidation, lung volume loss, pleural effusion, and tumor in this patient with prior cross-sectional imaging demonstrating lung mass. Given the increasing airspace opacity, differential includes endobronchial tumor/debris leading to partial volume loss. Correlation with bronchoscopy may be useful. Electronically Signed   By: Corrie Mckusick D.O.   On: 10/21/2017 13:37        Scheduled Meds: . amiodarone  400 mg Oral BID  . aspirin  81 mg Oral Daily  . atorvastatin  40 mg Oral q1800  . benzonatate  200 mg Oral BID  . docusate sodium  200 mg Oral BID  . feeding supplement (ENSURE ENLIVE)  237 mL Oral BID BM    . folic acid  1 mg Oral Daily  . heparin  5,000 Units Subcutaneous Q8H  . insulin aspart  0-5 Units Subcutaneous QHS  . insulin aspart  0-9 Units Subcutaneous TID WC  . lacosamide  100 mg Oral BID  . levETIRAcetam  1,500 mg Oral BID  . metoprolol tartrate  50 mg Oral Q8H  . multivitamin with minerals  1 tablet Oral Daily  . phenytoin  30 mg Oral BID  . sodium chloride flush  3 mL Intravenous Q12H  . tamsulosin  0.4 mg Oral Daily  . thiamine  100 mg Oral Daily   Continuous Infusions: . sodium chloride    . sodium chloride 10 mL/hr at 11/06/17 0700  . digoxin (LANOXIN) tablet 0.1875 mg    . diltiazem (CARDIZEM) infusion 10 mg/hr (11/06/17 0700)     LOS: 10 days     Cordelia Poche, MD [facility] 11/06/2017, 7:50 AM Pager: [phone]  If 7PM-7AM, please contact night-coverage [email or web address] Password [email or web address] 11/06/2017, 7:50 AM

## 2017-11-06 NOTE — Progress Notes (Signed)
Progress Note  Patient Name: Wyatt Salas Date of Encounter: 11/06/2017  Primary Cardiologist: Glenetta Hew, MD   Subjective   Found to have large pericardia effusion and underwent tap on 4/19 with 750cc of bloody fluid. Drain now in place.  Cytology pending.   Remains in AFL but rates now in 70-80s on po amio. Says his breathing is much better. Still with hacking cough. No chest pain.   Minimal drainage from pericardial tube overnight < 25cc  Inpatient Medications    Scheduled Meds: . amiodarone  400 mg Oral BID  . aspirin  81 mg Oral Daily  . atorvastatin  40 mg Oral q1800  . benzonatate  200 mg Oral BID  . docusate sodium  200 mg Oral BID  . feeding supplement (ENSURE ENLIVE)  237 mL Oral BID BM  . folic acid  1 mg Oral Daily  . heparin  5,000 Units Subcutaneous Q8H  . insulin aspart  0-5 Units Subcutaneous QHS  . insulin aspart  0-9 Units Subcutaneous TID WC  . lacosamide  100 mg Oral BID  . levETIRAcetam  1,500 mg Oral BID  . metoprolol tartrate  50 mg Oral Q8H  . multivitamin with minerals  1 tablet Oral Daily  . phenytoin  30 mg Oral BID  . sodium chloride flush  3 mL Intravenous Q12H  . tamsulosin  0.4 mg Oral Daily  . thiamine  100 mg Oral Daily   Continuous Infusions: . sodium chloride    . sodium chloride 10 mL/hr at 11/06/17 0700  . digoxin (LANOXIN) tablet 0.1875 mg    . diltiazem (CARDIZEM) infusion Stopped (11/06/17 0938)   PRN Meds: sodium chloride, acetaminophen, diltiazem, guaiFENesin-dextromethorphan, HYDROcodone-acetaminophen, ondansetron **OR** ondansetron (ZOFRAN) IV, phenol, polyethylene glycol, polyvinyl alcohol, sodium chloride flush   Vital Signs    Vitals:   11/06/17 0500 11/06/17 0600 11/06/17 0700 11/06/17 0730  BP: 97/63 104/68 91/70   Pulse:      Resp: 14 14 19    Temp:    98.3 F (36.8 C)  TempSrc:    Oral  SpO2: 96% 98% 97%   Weight: 56.6 kg (124 lb 12.5 oz)     Height:        Intake/Output Summary (Last 24 hours)  at 11/06/2017 1006 Last data filed at 11/06/2017 0700 Gross per 24 hour  Intake 2299.96 ml  Output 975 ml  Net 1324.96 ml   Filed Weights   11/04/17 0500 10/21/2017 0539 11/06/17 0500  Weight: 57.2 kg (126 lb 1.6 oz) 57.2 kg (126 lb 1.7 oz) 56.6 kg (124 lb 12.5 oz)    Telemetry    AFL 70-80s Personally reviewed   ECG    No new - Personally Reviewed  Physical Exam   General:  Sitting up in bed. Frail cachetic HEENT: normal x for temporal wasting  Neck: supple. JVP 7-8. Carotids 2+ bilat; no bruits. No lymphadenopathy or thryomegaly appreciated. Cor: PMI nondisplaced. Irregular rate & rhythm. No rubs, gallops or murmurs. Drain site ok Lungs: decreased BS throughout. N owheezr Abdomen: soft, nontender, nondistended. No hepatosplenomegaly. No bruits or masses. Good bowel sounds. Extremities: no cyanosis, clubbing, rash, trace edema Neuro: alert & orientedx3, cranial nerves grossly intact. moves all 4 extremities w/o difficulty. Affect pleasant    Labs    Chemistry Recent Labs  Lab 10/31/17 0422  11/03/17 0425 11/04/17 1301 10/31/2017 1852 11/06/17 0301  NA 132*   < > 133* 131*  --  131*  K 4.6   < >  4.8 4.2  --  4.2  CL 98*   < > 99* 99*  --  100*  CO2 24   < > 22 22  --  21*  GLUCOSE 162*   < > 163* 144*  --  161*  BUN 10   < > 9 11  --  6  CREATININE 0.56*   < > 0.57* 0.49* 0.55* 0.51*  CALCIUM 9.0   < > 9.4 9.0  --  8.8*  PROT 6.0*  --  7.0  --   --   --   ALBUMIN 2.3*  --  2.4*  --   --   --   AST 38  --  57*  --   --   --   ALT 33  --  53  --   --   --   ALKPHOS 75  --  88  --   --   --   BILITOT 0.2*  --  0.3  --   --   --   GFRNONAA >60   < > >60 >60 >60 >60  GFRAA >60   < > >60 >60 >60 >60  ANIONGAP 10   < > 12 10  --  10   < > = values in this interval not displayed.     Hematology Recent Labs  Lab 11/03/17 0425 11/09/2017 1852 11/06/17 0301  WBC 11.3* 9.4 7.6  RBC 4.43 4.14* 4.10*  HGB 11.1* 10.8* 10.6*  HCT 36.1* 33.2* 33.0*  MCV 81.5 80.2 80.5   MCH 25.1* 26.1 25.9*  MCHC 30.7 32.5 32.1  RDW 15.0 15.1 15.0  PLT 462* 441* 471*    Cardiac EnzymesNo results for input(s): TROPONINI in the last 168 hours. No results for input(s): TROPIPOC in the last 168 hours.   BNPNo results for input(s): BNP, PROBNP in the last 168 hours.   DDimer No results for input(s): DDIMER in the last 168 hours.   Radiology    See chart  Cardiac Studies   Echo 11/03/17: Study Conclusions - Left ventricle: The cavity size was normal. Wall thickness was normal. Systolic function was mildly to moderately reduced. The estimated ejection fraction was in the range of 40% to 45%. Diffuse hypokinesis. Features are consistent with a pseudonormal left ventricular filling pattern, with concomitant abnormal relaxation and increased filling pressure (grade 2 diastolic dysfunction). - Mitral valve: There was mild regurgitation. - Tricuspid valve: There was trivial regurgitation. - Pericardium, extracardiac: A large pericardial effusion (up to 3cm) was identified circumferential to the heart. The fluid had no internal echoes. No RA or RV collapse. IVC is small in diameter but does not demonstrate respiratory variation. There was no significant evidence of hemodynamic compromise.  Impressions: - Pericardial effusion is new when compared to prior.      Patient Profile     67 y.o. male with a hx of high fall risk with multiple falls in the past 2 months with multiple fractures, wheel chair dependent, remote hx of subdural hematoma s/p right sided craniotomy (?2000), seizure disorder, alcohol abuse, chronic hep C, HTN, HDL, chronic diastolic heart failure, and lung mass concerning for cancernow with a fib RVR found on admit for fall.   Assessment & Plan    A flutter with RVR - Improved on digoxin and po amiodarone. Will continue. - CHA2DS2-VASc Score 4 (CHF, HTN, DM, age). 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 - Agree with palliative care consult   Large Pericardial Effusion with tamponade physiology - s/p pericardiocentesis on 11/09/2017 ith 750cc out. - Almost certainly malignant. cytology pending - drain in place. Minimal drainage. Will pull tomorrow  Chronic combined systolic and diastolic HF  - EF 79-03% Y3FX, diffuse hypokinesis - Etiology unclear. ? Tachy-induced. Will see if EF improves after pericardiocentesis  - Not cath candidate at this point   For questions or updates, please contact [place] Please consult [email or web address] for contact info under Cardiology/STEMI.      Signed, Glori Bickers, MD  11/06/2017, 10:06 AM

## 2017-11-06 NOTE — Progress Notes (Signed)
  Echocardiogram 2D Echocardiogram has been performed.  Darlina Sicilian M 11/06/2017, 8:09 AM

## 2017-11-07 ENCOUNTER — Inpatient Hospital Stay (HOSPITAL_COMMUNITY): Payer: Medicare HMO

## 2017-11-07 DIAGNOSIS — I5041 Acute combined systolic (congestive) and diastolic (congestive) heart failure: Secondary | ICD-10-CM

## 2017-11-07 LAB — CBC
HCT: 35.5 % — ABNORMAL LOW (ref 39.0–52.0)
Hemoglobin: 11 g/dL — ABNORMAL LOW (ref 13.0–17.0)
MCH: 25.2 pg — AB (ref 26.0–34.0)
MCHC: 31 g/dL (ref 30.0–36.0)
MCV: 81.2 fL (ref 78.0–100.0)
PLATELETS: 497 10*3/uL — AB (ref 150–400)
RBC: 4.37 MIL/uL (ref 4.22–5.81)
RDW: 15.1 % (ref 11.5–15.5)
WBC: 10.3 10*3/uL (ref 4.0–10.5)

## 2017-11-07 LAB — BASIC METABOLIC PANEL
Anion gap: 8 (ref 5–15)
BUN: 9 mg/dL (ref 6–20)
CALCIUM: 9.1 mg/dL (ref 8.9–10.3)
CHLORIDE: 98 mmol/L — AB (ref 101–111)
CO2: 23 mmol/L (ref 22–32)
CREATININE: 0.58 mg/dL — AB (ref 0.61–1.24)
GFR calc non Af Amer: >60 mL/min (ref >60)
GLUCOSE: 218 mg/dL — AB (ref 65–99)
Potassium: 4.5 mmol/L (ref 3.5–5.1)
Sodium: 129 mmol/L — ABNORMAL LOW (ref 135–145)

## 2017-11-07 LAB — GLUCOSE, CAPILLARY
GLUCOSE-CAPILLARY: 158 mg/dL — AB (ref 65–99)
GLUCOSE-CAPILLARY: 157 mg/dL — AB (ref 65–99)
GLUCOSE-CAPILLARY: 157 mg/dL — AB (ref 65–99)
Glucose-Capillary: 180 mg/dL — ABNORMAL HIGH (ref 65–99)
Glucose-Capillary: 161 mg/dL — ABNORMAL HIGH (ref 65–99)

## 2017-11-07 LAB — DIGOXIN LEVEL: DIGOXIN LVL: 0.9 ng/mL (ref 0.8–2.0)

## 2017-11-07 MED ORDER — DILTIAZEM HCL 60 MG PO TABS: 30.0000 mg | ORAL_TABLET | Freq: Four times a day (QID) | ORAL | Status: DC

## 2017-11-07 MED ORDER — AMIODARONE LOAD VIA INFUSION
150.0000 mg | Freq: Once | INTRAVENOUS | Status: AC
  Administered 2017-11-07: 150 mg via INTRAVENOUS
  Filled 2017-11-07: qty 83.34

## 2017-11-07 MED ORDER — DEXTROSE 5 % IV SOLN
300.0000 mg | Freq: Once | INTRAVENOUS | Status: AC
  Administered 2017-11-07: 300 mg via INTRAVENOUS
  Filled 2017-11-07: qty 6

## 2017-11-07 MED ORDER — AMIODARONE HCL IN DEXTROSE 360-4.14 MG/200ML-% IV SOLN
30.0000 mg/h | INTRAVENOUS | Status: DC
  Administered 2017-11-07 – 2017-11-10 (×5): 30 mg/h via INTRAVENOUS
  Filled 2017-11-07 (×5): qty 200

## 2017-11-07 MED ORDER — AMIODARONE HCL IN DEXTROSE 360-4.14 MG/200ML-% IV SOLN
60.0000 mg/h | INTRAVENOUS | Status: DC
  Administered 2017-11-07 (×3): 60 mg/h via INTRAVENOUS
  Filled 2017-11-07 (×3): qty 200

## 2017-11-07 MED ORDER — DILTIAZEM HCL 60 MG PO TABS
30.0000 mg | ORAL_TABLET | Freq: Four times a day (QID) | ORAL | Status: DC
  Administered 2017-11-07: 30 mg via ORAL
  Filled 2017-11-07: qty 1

## 2017-11-07 NOTE — Consult Note (Deleted)
   Name: Wyatt Salas MRN: [id number] DOB: 12-24-1950    ADMISSION DATE:  10/25/2017 CONSULTATION DATE:  11/07/2017  REFERRING MD :  Dr. Cordelia Poche  CHIEF COMPLAINT:  Lung mass, R sided effusion, Cough  HISTORY OF PRESENT ILLNESS:  Wyatt Salas is a 67 y/o man with a history significant for EtOH abuse, seizures, HFpEF, TBI with left sided hemiparesis and RUL mass. He had a previously identified posterior RUL mass on CT in November with Dr. Lamonte Sakai suggesting stage IIIa disease but did not follow up planned bronchoscopy. He presented after a fall from wheelchair on 4/9 and was admitted due to development of Afib with RVR. He also reported a new cough productive of yellow sputum. His heart rate was controlled with diltiazem and metoprolol.  He was seen by PCCM on 4/11 and ultrasound guided thoracentesis performed on 4/12 producing 1425 mLs of yellow fluid exudative according to elevated LDH with few atypical cells present. 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 Palliative care was consulted to discuss [place] and patient is planned for outpatient oncology follow up.  On 4/17 echocardiogram revealed new pericardial effusion and he underwent pericardiocentesis with drain placement on 4/19 that produced 750 mLs of bloody fluid. He continues to cough productively and has significant WOB on minor exertion so repeat CXR on 4/21 showed worsened opacity of the right lung fields and PCCM was reconsulted for management.   SIGNIFICANT EVENTS  4/12 Right sided Thoracentesis 4/16 Bronchoscopic biopsy of RUL mass 4/19 Pericardiocentesis with drain placement  STUDIES:  4/9 CT abdomen/pelvis - 2L bladder distention 4/9 CT head - Severe R cerebral encephalomalacia 4/9 CT chest - Partially loculated moderate R effusion, approximately 5.8x5.3x5 cm spiculated  RUL mass, right lower and mediastinal bulky adenopathy present 4/17 TTE - New large pericardial effusion >3cm 4/21 CXR - Increased opacity in right hemithorax with volume loss, pericardial drain present   PAST MEDICAL HISTORY :

## 2017-11-07 NOTE — Progress Notes (Signed)
PROGRESS NOTE    Wyatt Salas  ZHY:[id number] DOB: 26-Sep-1950 DOA: 11/07/2017 PCP: [name], MD   Brief Narrative: Wyatt Salas is a 67 y.o. male with a history of alcohol use, seizure disorder, DM-2, hypertension, known lung mass. He presented secondary to a fall, found to be in atrial fibrillation with RVR. 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 Biopsy pending. atrial fib/flutter difficult to control. Cardiology on board.  S/p pericardiocentesis on 4/19. Plan for SNF on discharge.    Assessment & Plan:   Principal Problem:   Atrial fibrillation with rapid ventricular response (HCC) Active Problems:   Seizure disorder (HCC)   Type 2 diabetes mellitus with hyperlipidemia (HCC)   Lung mass   Essential hypertension   Acute urinary retention   Chronic diastolic CHF (congestive heart failure) (HCC)   Closed fracture of distal end of left humerus   Protein-calorie malnutrition, severe   Acute combined systolic and diastolic heart failure (HCC)   Cardiac/pericardial tamponade   Goals of care, counseling/discussion   Palliative care by specialist   Atrial flutter w/ RVR Improved heart rate yesterday, however, with tachycardia this morning. Telemetry reviewed, and appears to still be in atrial flutter. CHA2DS2-VASc Score is 3. Complicated by mild hypotension this morning. Taken off Cardizem drip this morning. -Cardiology recommendations: Amiodarone, metoprolol, digoxin; await recs today  Non-small cell carcinoma Patient is s/p bronchoscopy with bronchial brushing significant for non-small cell carcinoma. Biopsy confirms carcinoma. Pleural effusion was transudative with atypical cells. -Discussed with Oncology on 4/17, [name], who will arrange for either her or [name] to see patient as an outpatient. -Palliative care medicine recommendations: outpatient palliative care  Pericardial effusion 3cm on  Transthoracic Echocardiogram. Patient tachycardic in setting of afib/flutter with hypotension in setting of diltiazem drip/metoprolol. ?malignant. S/p pericardiocentesis on 4/19. Drain with 20 ml out in last 24 hours. -Cytology/culture (no growth to date) pending  Essential hypertension Patient having some hypotension again overnight.  -Per cardiology as mentioned above  Dysphagia -Dysphagia 3 diet per speech therapy recommendations  Left humerus fracture Secondary to fall. Outpatient follow-up with Dr. [name].  History of alcohol use Out of window for DTs  Deconditioning Frequent falls -PT eval: SNF  Left ankle pain Likely arthritic. Symptomatic management.  Left heel pain No evidence of pressure ulcer.  Acute urinary retention Continue Flomax. Foley placed on 4/13. Plan for outpatient urology follow-up.  Decreased urine output 475 mL of urine over last 24 hours. No urinary symptoms. Appears euvolemic on exam. 100 mL in bag this morning since 7AM. Patient able to take by mouth. Kidney function stable, however, creatinine likely a poor indicator of kidney function in patient's malnourished state. -Encourage good oral intake of fluids -Monitor urine output and fluid intake closely today  Diabetes mellitus, type 2 -Continue SSI  Seizure disorder -Continue Keppra and Dilantin  Chronic combined systolic and diastolic heart failure EF of 40-45% with grade 2 diastolic dysfunction. Diffuse hypokinesis. Weight is up 3 lbs from yesterday, but has been stable. -Daily weights -Strict in/out  Right chest pain Cough present, however, cough is not new. Pain is pleuritic. Chest x-ray significant for increased airspace opacity on 4/19. Still with cough and mild production. Afebrile. No leukocytosis. -repeat chest x-ray today   DVT prophylaxis: Lovenox Code Status:   Code Status: Full Code Family Communication: None at bedside Disposition Plan: Discharge to SNF when atrial flutter/rvr  improved   Consultants:   Pulmonology  Cardiology  Palliative care medicine  Oncology (telepone)  Procedures:   Bronchoscopy (11/16/2017)  Transthoracic Echocardiogram (11/03/17) Study Conclusions - Left ventricle: The cavity size was normal. Wall thickness was normal. Systolic function was mildly to moderately reduced. The estimated ejection fraction was in the range of 40% to 45%. Diffuse hypokinesis. Features are consistent with a pseudonormal left ventricular filling pattern, with concomitant abnormal relaxation and increased filling pressure (grade 2 diastolic dysfunction). - Mitral valve: There was mild regurgitation. - Tricuspid valve: There was trivial regurgitation. - Pericardium, extracardiac: A large pericardial effusion (up to 3cm) was identified circumferential to the heart. The fluid had no internal echoes. No RA or RV collapse. IVC is small in diameter but does not demonstrate respiratory variation. There was no significant evidence of hemodynamic compromise.  Impressions: - Pericardial effusion is new when compared to prior.  Pericardiocentesis (4/19)   Antimicrobials:  None    Subjective: Left heel pain.  Objective: Vitals:   11/07/17 0500 11/07/17 0632 11/07/17 0700 11/07/17 0739  BP: 93/73  (!) 89/65   Pulse:      Resp: 19  (!) 22   Temp:    98.2 F (36.8 C)  TempSrc:    Oral  SpO2: 97%  98%   Weight:  57.9 kg (127 lb 10.3 oz)    Height:        Intake/Output Summary (Last 24 hours) at 11/07/2017 0828 Last data filed at 11/07/2017 0700 Gross per 24 hour  Intake 1765.17 ml  Output 495 ml  Net 1270.17 ml   Filed Weights   11/07/2017 0539 11/06/17 0500 11/07/17 5784  Weight: 57.2 kg (126 lb 1.7 oz) 56.6 kg (124 lb 12.5 oz) 57.9 kg (127 lb 10.3 oz)    Examination:  General: Well appearing, no distress Respiratory system: Mild end-expiratory wheeze. Respiratory effort normal.  Cardiovascular system: fast rate and  irregular rhythm. Diminished S1 and S2. No heart murmurs present. No extra heart sounds Gastrointestinal system: Soft, non-tender, non-distended, no guarding, no rebound, no masses felt Central nervous system: Alert, oriented, no focal deficits Extremities: No edema. No calf tenderness. Left heel without evidence of pressure ulcer; tenderness Skin: No cyanosis. No rashes Psychiatry: judgement, insight normal. Mood slightly depressed with flat affect.    Data Reviewed: I have personally reviewed following labs and imaging studies  CBC: Recent Labs  Lab 11/01/17 0506 11/03/17 0425 11/11/2017 1852 11/06/17 0301  WBC 10.4 11.3* 9.4 7.6  HGB 10.6* 11.1* 10.8* 10.6*  HCT 33.5* 36.1* 33.2* 33.0*  MCV 81.9 81.5 80.2 80.5  PLT 384 462* 441* 696*   Basic Metabolic Panel: Recent Labs  Lab 11/01/17 0506 11/03/17 0425 11/04/17 1301 11/01/2017 1852 11/06/17 0301  NA 133* 133* 131*  --  131*  K 4.5 4.8 4.2  --  4.2  CL 100* 99* 99*  --  100*  CO2 23 22 22   --  21*  GLUCOSE 170* 163* 144*  --  161*  BUN 9 9 11   --  6  CREATININE 0.53* 0.57* 0.49* 0.55* 0.51*  CALCIUM 9.1 9.4 9.0  --  8.8*  MG 1.7 1.8 1.9  --   --    GFR: Estimated Creatinine Clearance: 72.4 mL/min (A) (by C-G formula based on SCr of 0.51 mg/dL (L)). Liver Function Tests: Recent Labs  Lab 11/03/17 0425  AST 57*  ALT 53  ALKPHOS 88  BILITOT 0.3  PROT 7.0  ALBUMIN 2.4*   No results for input(s): LIPASE, AMYLASE in the last  168 hours. No results for input(s): AMMONIA in the last 168 hours. Coagulation Profile: No results for input(s): INR, PROTIME in the last 168 hours. Cardiac Enzymes: No results for input(s): CKTOTAL, CKMB, CKMBINDEX, TROPONINI in the last 168 hours. BNP (last 3 results) No results for input(s): PROBNP in the last 8760 hours. HbA1C: No results for input(s): HGBA1C in the last 72 hours. CBG: Recent Labs  Lab 10/18/2017 1834 11/07/2017 2249 11/06/17 0728 11/06/17 2120 11/07/17 0733    GLUCAP 114* 203* 171* 158* 157*   Lipid Profile: No results for input(s): CHOL, HDL, LDLCALC, TRIG, CHOLHDL, LDLDIRECT in the last 72 hours. Thyroid Function Tests: No results for input(s): TSH, T4TOTAL, FREET4, T3FREE, THYROIDAB in the last 72 hours. Anemia Panel: No results for input(s): VITAMINB12, FOLATE, FERRITIN, TIBC, IRON, RETICCTPCT in the last 72 hours. Sepsis Labs: No results for input(s): PROCALCITON, LATICACIDVEN in the last 168 hours.  Recent Results (from the past 240 hour(s))  Body fluid culture     Status: None   Collection Time: 10/29/17 10:26 AM  Result Value Ref Range Status   Specimen Description   Final    PLEURAL RIGHT Performed at [facility] [address] [place], [place] [address]    Special Requests   Final    NONE Performed at [facility], [place] [address] [place], [place] [address]    Gram Stain   Final    FEW WBC PRESENT, PREDOMINANTLY MONONUCLEAR NO ORGANISMS SEEN    Culture   Final    NO GROWTH 3 DAYS Performed at [facility] [address] [place], [place] [address]    Report Status 11/01/2017 FINAL  Final  Body fluid culture     Status: None (Preliminary result)   Collection Time: 10/21/2017  4:33 PM  Result Value Ref Range Status   Specimen Description PERICARDIAL [facility]  Final   Special Requests NONE  Final   Gram Stain   Final    FEW WBC PRESENT, PREDOMINANTLY PMN NO ORGANISMS SEEN    Culture   Final    NO GROWTH < 24 HOURS Performed at [facility], [place] [address], [place], [place] [address]    Report Status PENDING  Incomplete         Radiology Studies: Dg Chest Port 1 View  Result Date: 11/11/2017 CLINICAL DATA:  67 year old male with a history of cough and shortness of breath EXAM: PORTABLE CHEST 1 VIEW COMPARISON:  Multiple prior chest x-rays most recent 11/01/2017, 10/29/2017, 10/29/2017, prior chest CT 11/07/2017 FINDINGS: Cardiomediastinal silhouette unchanged in  size and contour, with heart borders partially obscured by lung/pleural disease. Significant right rotation of the patient somewhat limits evaluation, however, there is increasing opacity at the right lung with mixed interstitial, interlobular septal opacities, and airspace opacity with evidence of pleural effusion. Increasing opacity along the upper right mediastinal border/paratracheal border. No pneumothorax. Left lung remains relatively well aerated. IMPRESSION: Increasing opacity of the right lung, likely a combination of airspace opacity/consolidation, lung volume loss, pleural effusion, and tumor in this patient with prior cross-sectional imaging demonstrating lung mass. Given the increasing airspace opacity, differential includes endobronchial tumor/debris leading to partial volume loss. Correlation with bronchoscopy may be useful. Electronically Signed   By: Corrie Mckusick D.O.   On: 11/15/2017 13:37        Scheduled Meds: . amiodarone  400 mg Oral BID  . aspirin  81 mg Oral Daily  . atorvastatin  40 mg Oral  q1800  . benzonatate  200 mg Oral BID  . docusate sodium  200 mg Oral BID  . feeding supplement (ENSURE ENLIVE)  237 mL Oral BID BM  . folic acid  1 mg Oral Daily  . heparin  5,000 Units Subcutaneous Q8H  . insulin aspart  0-5 Units Subcutaneous QHS  . insulin aspart  0-9 Units Subcutaneous TID WC  . lacosamide  100 mg Oral BID  . levETIRAcetam  1,500 mg Oral BID  . metoprolol tartrate  50 mg Oral Q8H  . multivitamin with minerals  1 tablet Oral Daily  . phenytoin  30 mg Oral BID  . sodium chloride flush  3 mL Intravenous Q12H  . tamsulosin  0.4 mg Oral Daily  . thiamine  100 mg Oral Daily   Continuous Infusions: . sodium chloride    . sodium chloride 10 mL/hr at 11/07/17 0700  . digoxin (LANOXIN) tablet 0.1875 mg    . diltiazem (CARDIZEM) infusion Stopped (11/07/17 0200)     LOS: 11 days     Cordelia Poche, MD [facility] 11/07/2017, 8:28 AM Pager: [phone]  If 7PM-7AM, please contact night-coverage [email or web address] Password [facility] 11/07/2017, 8:28 AM

## 2017-11-07 NOTE — Progress Notes (Signed)
Name: DESHAY BLUMENFELD MRN:   [id number] DOB:   1951/07/20             ADMISSION DATE:  10/25/2017  REFERRING MD :  Dr. Cordelia Poche  CHIEF COMPLAINT:  Lung mass, R sided effusion, Cough  HISTORY OF PRESENT ILLNESS:  Mr. Oaxaca is a 67 y/o man with a history significant for EtOH abuse, seizures, HFpEF, TBI with left sided hemiparesis and RUL mass. He had a previously identified posterior RUL mass on CT in November with Dr. Lamonte Sakai suggesting stage IIIa disease but did not follow up planned bronchoscopy. He presented after a fall from wheelchair on 4/9 and was admitted due to development of Afib with RVR. He also reported a new cough productive of yellow sputum. His heart rate was controlled with diltiazem and metoprolol.  He was seen by PCCM on 4/11 and ultrasound guided thoracentesis performed on 4/12 producing 1425 mLs of yellow fluid exudative according to elevated LDH with few atypical cells present. 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 Palliative care was consulted to discuss [place] and patient is planned for outpatient oncology follow up.  On 4/17 echocardiogram revealed new pericardial effusion and he underwent pericardiocentesis with drain placement on 4/19 that produced 750 mLs of bloody fluid. He continues to cough productively and has significant WOB on minor exertion so repeat CXR on 4/21 showed worsened opacity of the right lung fields and PCCM was reconsulted for management.   SIGNIFICANT EVENTS  4/12 Right sided Thoracentesis 4/16 Bronchoscopic biopsy of RUL mass 4/19 Pericardiocentesis with drain placement  STUDIES:  4/9 CT abdomen/pelvis - 2L bladder distention 4/9 CT head - Severe R cerebral encephalomalacia 4/9 CT chest - Partially loculated moderate R effusion, approximately 5.8x5.3x5 cm spiculated RUL mass,  right lower and mediastinal bulky adenopathy present 4/17 TTE - New large pericardial effusion >3cm 4/21 CXR - Increased opacity in right hemithorax with volume loss, pericardial drain present   SOCIAL HISTORY:  reports that he has quit smoking. His smoking use included cigars. He has never used smokeless tobacco. He reports that he has current or past drug history. Drug: Marijuana. He reports that he does not drink alcohol.  REVIEW OF SYSTEMS:   Constitutional: Fatigue, weakness HENT: Sore throat Eyes: Negative for blurred vision, double vision Respiratory: Cough, sputum production, negative for hemoptysis   Cardiovascular: Negative for chest pain, palpitations, leg swelling and PND.  Gastrointestinal: Negative for heartburn, nausea, vomiting Genitourinary: Negative for dysuria Musculoskeletal: Negative for myalgias Skin: Negative for itching and rash.  Neurological: Chronic left sided weakness Endo/Heme/Allergies: Does not bruise/bleed easily.  SUBJECTIVE:   VITAL SIGNS: Temp:  [98 F (36.7 C)-98.6 F (37 C)] 98.3 F (36.8 C) (04/21 1707) Pulse Rate:  [89] 89 (04/20 2130) Resp:  [15-25] 15 (04/21 1615) BP: (75-106)/(45-82) 102/78 (04/21 1615) SpO2:  [93 %-98 %] 95 % (04/21 1615) Weight:  [127 lb 10.3 oz (57.9 kg)] 127 lb 10.3 oz (57.9 kg) (04/21 0623)  PHYSICAL EXAMINATION: General:  Cachectic man lying in no acute distress Neuro:  Flaccid LUE, other extremities moving to command HEENT:  Pupils symmetric, moist mucosa, no cervical adenopathy Cardiovascular:  Tachycardic, irregular, no murmur Lungs:  Diminished breath sounds throughout right fields Abdomen:  Soft, nondistended Musculoskeletal:  No peripheral edema, LUE in soft bracing Skin:  No rashes or purpura  Recent Labs  Lab 11/04/17 1301 11/03/2017 1852 11/06/17 0301  11/07/17 0848  NA 131*  --  131* 129*  K 4.2  --  4.2 4.5  CL 99*  --  100* 98*  CO2 22  --  21* 23  BUN 11  --  6 9  CREATININE 0.49* 0.55*  0.51* 0.58*  GLUCOSE 144*  --  161* 218*   Recent Labs  Lab 10/22/2017 1852 11/06/17 0301 11/07/17 0848  HGB 10.8* 10.6* 11.0*  HCT 33.2* 33.0* 35.5*  WBC 9.4 7.6 10.3  PLT 441* 471* 497*   Dg Chest Port 1 View  Result Date: 11/07/2017 CLINICAL DATA:  Pleural effusion. EXAM: PORTABLE CHEST 1 VIEW COMPARISON:  Multiple prior studies including CT 10/23/2017 FINDINGS: The heart margins are obscured by significant opacity in the RIGHT hemithorax. A RIGHT-sided small bore pleural drain is in place, overlying the heart and RIGHT lung base. There is increased opacity in the RIGHT hemithorax, consistent with pleural effusion and parenchymal opacity. There is volume loss in the RIGHT lung. LEFT basilar opacity partially obscures the hemidiaphragm. Multiple LEFT-sided skin folds. No pneumothorax. There are distended loops of bowel in the LEFT UPPER QUADRANT the abdomen. Remote bilateral rib fractures. IMPRESSION: 1. Increased opacity in the RIGHT lung. 2. Stable LEFT basilar opacity. Electronically Signed   By: Nolon Nations M.D.   On: 11/07/2017 10:49    ASSESSMENT / PLAN:  Non small cell lung cancer Recurring R sided pleural effusion Posterior RUL primary mass with atypical cells on pleural cytology and bulky lower lobe and mediastinal lymph node involvement would be consistent with stage IIIa vs IV disease. At this point he will need outpatient oncology follow up for goals of hospice/palliation of symptoms. At the moment he is stable without worsening oxygen requirements today.  His previous chest CT indicates partially loculated effusion but previous thoracentesis was productive of large free flowing volume. This does not seem related to his heart failure. His cough and exertional tolerance may benefit with therapeutic procedure for fluid removal. He is also at risk for obstructive PNA with 90% compression of RUL on bronchoscopy.  Plan: Needs repeat chest imaging, consider lateral decubitus or  POCUS evaulation Follow for fever, WBC May need repeat imaging if incompletely draining 2/2 loculations Continue supplemental O2 as needed Incentive spirometry Continued management of tachyarrythmia per primary service and cardiology    Collier Salina, MD PGY-III Internal Medicine Resident Pager# [phone] 11/07/2017, 6:54 PM

## 2017-11-07 NOTE — Progress Notes (Signed)
  Amiodarone Drug - Drug Interaction Consult Note  Recommendations: -Amiodarone may increase phenytoin concentration - would check a phenytoin level within 2-3 days if amiodarone is continued  Amiodarone is metabolized by the cytochrome P450 system and therefore has the potential to cause many drug interactions. Amiodarone has an average plasma half-life of 50 days (range 20 to 100 days).   There is potential for drug interactions to occur several weeks or months after stopping treatment and the onset of drug interactions may be slow after initiating amiodarone.   [x]  Statins: Increased risk of myopathy. Simvastatin- restrict dose to 20mg  daily. Other statins: counsel patients to report any muscle pain or weakness immediately.  []  Anticoagulants: Amiodarone can increase anticoagulant effect. Consider warfarin dose reduction. Patients should be monitored closely and the dose of anticoagulant altered accordingly, remembering that amiodarone levels take several weeks to stabilize.  [x]  Antiepileptics: Amiodarone can increase plasma concentration of phenytoin, the dose should be reduced. Note that small changes in phenytoin dose can result in large changes in levels. Monitor patient and counsel on signs of toxicity.  []  Beta blockers: increased risk of bradycardia, AV block and myocardial depression. Sotalol - avoid concomitant use.  []   Calcium channel blockers (diltiazem and verapamil): increased risk of bradycardia, AV block and myocardial depression.  []   Cyclosporine: Amiodarone increases levels of cyclosporine. Reduced dose of cyclosporine is recommended.  []  Digoxin dose should be halved when amiodarone is started.  []  Diuretics: increased risk of cardiotoxicity if hypokalemia occurs.  []  Oral hypoglycemic agents (glyburide, glipizide, glimepiride): increased risk of hypoglycemia. Patient's glucose levels should be monitored closely when initiating amiodarone therapy.   []  Drugs that  prolong the QT interval:  Torsades de pointes risk may be increased with concurrent use - avoid if possible.  Monitor QTc, also keep magnesium/potassium WNL if concurrent therapy can't be avoided. Marland Kitchen Antibiotics: e.g. fluoroquinolones, erythromycin. . Antiarrhythmics: e.g. quinidine, procainamide, disopyramide, sotalol. . Antipsychotics: e.g. phenothiazines, haloperidol.  . Lithium, tricyclic antidepressants, and methadone. Thank You,   Arrie Senate, PharmD, [place] PGY-2 Cardiology Pharmacy Resident Pager: [phone] 11/07/2017

## 2017-11-07 NOTE — Progress Notes (Addendum)
Progress Note  Patient Name: Wyatt Salas Date of Encounter: 11/07/2017  Primary Cardiologist: Glenetta Hew, MD   Subjective   Found to have large pericardial effusion and underwent tap on 4/19 with 750cc of bloody fluid. Drain now in place.    HR up again overnight. Started on dilt at 7.5 but dropped SBP into 80s so turned back to 5mg . SBP now in 90s  Denies CP or SOB.   Minimal drainage from pericardial tube overnight. Cytology still pending   Inpatient Medications    Scheduled Meds: . amiodarone  400 mg Oral BID  . aspirin  81 mg Oral Daily  . atorvastatin  40 mg Oral q1800  . benzonatate  200 mg Oral BID  . docusate sodium  200 mg Oral BID  . feeding supplement (ENSURE ENLIVE)  237 mL Oral BID BM  . folic acid  1 mg Oral Daily  . heparin  5,000 Units Subcutaneous Q8H  . insulin aspart  0-5 Units Subcutaneous QHS  . insulin aspart  0-9 Units Subcutaneous TID WC  . lacosamide  100 mg Oral BID  . levETIRAcetam  1,500 mg Oral BID  . metoprolol tartrate  50 mg Oral Q8H  . multivitamin with minerals  1 tablet Oral Daily  . phenytoin  30 mg Oral BID  . sodium chloride flush  3 mL Intravenous Q12H  . tamsulosin  0.4 mg Oral Daily  . thiamine  100 mg Oral Daily   Continuous Infusions: . sodium chloride    . sodium chloride 10 mL/hr at 11/07/17 0700  . digoxin (LANOXIN) tablet 0.1875 mg    . diltiazem (CARDIZEM) infusion 7.5 mg/hr (11/07/17 1133)   PRN Meds: sodium chloride, acetaminophen, diltiazem, guaiFENesin-dextromethorphan, HYDROcodone-acetaminophen, ondansetron **OR** ondansetron (ZOFRAN) IV, phenol, polyethylene glycol, polyvinyl alcohol, sodium chloride flush   Vital Signs    Vitals:   11/07/17 0739 11/07/17 0800 11/07/17 0900 11/07/17 1000  BP:  (!) 87/72 96/70 103/76  Pulse:      Resp:  (!) 22 (!) 21 (!) 24  Temp: 98.2 F (36.8 C)     TempSrc: Oral     SpO2:  97% 98% 97%  Weight:      Height:        Intake/Output Summary (Last 24 hours) at  11/07/2017 1124 Last data filed at 11/07/2017 0700 Gross per 24 hour  Intake 1120 ml  Output 495 ml  Net 625 ml   Filed Weights   11/11/2017 0539 11/06/17 0500 11/07/17 0632  Weight: 57.2 kg (126 lb 1.7 oz) 56.6 kg (124 lb 12.5 oz) 57.9 kg (127 lb 10.3 oz)    Telemetry    AFL 90-100s currently. Up to 120s overnight Personally reviewed   ECG    No new - Personally Reviewed  Physical Exam   General:  Frail cachetic. Lying in bed  No resp difficulty HEENT: normal x for temporal wasting  Neck: supple. JVP 7 Carotids 2+ bilat; no bruits. No lymphadenopathy or thryomegaly appreciated. Cor: PMI nondisplaced. Irregular rate & rhythm. No rubs, gallops or murmurs. Pericardial drain site ok  Lungs: clear Abdomen: soft, nontender, nondistended. No hepatosplenomegaly. No bruits or masses. Good bowel sounds. Extremities: no cyanosis, clubbing, rash, edema Neuro: alert & orientedx3, cranial nerves grossly intact. moves all 4 extremities w/o difficulty. Affect pleasant  Labs    Chemistry Recent Labs  Lab 11/03/17 0425 11/04/17 1301 10/30/2017 1852 11/06/17 0301 11/07/17 0848  NA 133* 131*  --  131* 129*  K 4.8  4.2  --  4.2 4.5  CL 99* 99*  --  100* 98*  CO2 22 22  --  21* 23  GLUCOSE 163* 144*  --  161* 218*  BUN 9 11  --  6 9  CREATININE 0.57* 0.49* 0.55* 0.51* 0.58*  CALCIUM 9.4 9.0  --  8.8* 9.1  PROT 7.0  --   --   --   --   ALBUMIN 2.4*  --   --   --   --   AST 57*  --   --   --   --   ALT 53  --   --   --   --   ALKPHOS 88  --   --   --   --   BILITOT 0.3  --   --   --   --   GFRNONAA >60 >60 >60 >60 >60  GFRAA >60 >60 >60 >60 >60  ANIONGAP 12 10  --  10 8     Hematology Recent Labs  Lab 11/06/2017 1852 11/06/17 0301 11/07/17 0848  WBC 9.4 7.6 10.3  RBC 4.14* 4.10* 4.37  HGB 10.8* 10.6* 11.0*  HCT 33.2* 33.0* 35.5*  MCV 80.2 80.5 81.2  MCH 26.1 25.9* 25.2*  MCHC 32.5 32.1 31.0  RDW 15.1 15.0 15.1  PLT 441* 471* 497*    Cardiac EnzymesNo results for  input(s): TROPONINI in the last 168 hours. No results for input(s): TROPIPOC in the last 168 hours.   BNPNo results for input(s): BNP, PROBNP in the last 168 hours.   DDimer No results for input(s): DDIMER in the last 168 hours.   Radiology    See chart  Cardiac Studies   Echo 11/03/17: Study Conclusions - Left ventricle: The cavity size was normal. Wall thickness was normal. Systolic function was mildly to moderately reduced. The estimated ejection fraction was in the range of 40% to 45%. Diffuse hypokinesis. Features are consistent with a pseudonormal left ventricular filling pattern, with concomitant abnormal relaxation and increased filling pressure (grade 2 diastolic dysfunction). - Mitral valve: There was mild regurgitation. - Tricuspid valve: There was trivial regurgitation. - Pericardium, extracardiac: A large pericardial effusion (up to 3cm) was identified circumferential to the heart. The fluid had no internal echoes. No RA or RV collapse. IVC is small in diameter but does not demonstrate respiratory variation. There was no significant evidence of hemodynamic compromise.  Impressions: - Pericardial effusion is new when compared to prior.      Patient Profile     67 y.o. male with a hx of high fall risk with multiple falls in the past 2 months with multiple fractures, wheel chair dependent, remote hx of subdural hematoma s/p right sided craniotomy (?2000), seizure disorder, alcohol abuse, chronic hep C, HTN, HDL, chronic diastolic heart failure, and lung mass concerning for cancernow with a fib RVR found on admit for fall.   Assessment & Plan    A flutter with RVR - Rate back up overnight. IV dilt restarted with good response. Will switch diltiazem to po 30q6. Continue amio for now - would like to wean if possible. Will stop digoxin and metoprolol as do not seem to be helping much. - CHA2DS2-VASc Score 4 (CHF, HTN, DM, age). 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 They have said they will  see as outpatient. With likely Stage IV disease would help if they could see as inpatient to facilitate discussions regarding treatment options and EOL issue.  - Palliative Care has seen  Large Pericardial Effusion with tamponade physiology - s/p pericardiocentesis on 11/10/2017 ith 750cc out. - Almost certainly malignant. cytology pending - drain in place. Minimal drainage. Echo from yesterday reviewed personally with only trivial residual effusion. Will clamp for 4 hours. If not significant drainage will pull.   Chronic combined systolic and diastolic HF  - EF 41-66% A6TK, diffuse hypokinesis - Etiology unclear. ? Tachy-induced. Will see if EF improves with rate control. Diltiazem not ideal with depressed EF but given only mildly depressed EF and probable tachy-induced CM probably ok.  - BP too soft for ACE/ARB at this point.  - Not cath candidate at this point   For questions or updates, please contact [place] Please consult [email or web address] for contact info under Cardiology/STEMI.      Signed, Glori Bickers, MD  11/07/2017, 11:24 AM    Addendum:  Pericardiac drain pulled without incident.   AFL rates back to 120-130 after IV diltiazem stopped. Will switch to IV amio as this is likely best option long-term for control.   Glori Bickers, MD  1:47 PM

## 2017-11-08 ENCOUNTER — Inpatient Hospital Stay (HOSPITAL_COMMUNITY): Payer: Medicare HMO

## 2017-11-08 ENCOUNTER — Encounter (HOSPITAL_COMMUNITY): Payer: Self-pay | Admitting: Interventional Cardiology

## 2017-11-08 DIAGNOSIS — I4892 Unspecified atrial flutter: Secondary | ICD-10-CM

## 2017-11-08 DIAGNOSIS — C3490 Malignant neoplasm of unspecified part of unspecified bronchus or lung: Secondary | ICD-10-CM

## 2017-11-08 LAB — GLUCOSE, CAPILLARY
GLUCOSE-CAPILLARY: 198 mg/dL — AB (ref 65–99)
GLUCOSE-CAPILLARY: 172 mg/dL — AB (ref 65–99)
GLUCOSE-CAPILLARY: 246 mg/dL — AB (ref 65–99)
GLUCOSE-CAPILLARY: 173 mg/dL — AB (ref 65–99)
GLUCOSE-CAPILLARY: 153 mg/dL — AB (ref 65–99)
Glucose-Capillary: 154 mg/dL — ABNORMAL HIGH (ref 65–99)
Glucose-Capillary: 185 mg/dL — ABNORMAL HIGH (ref 65–99)
Glucose-Capillary: 148 mg/dL — ABNORMAL HIGH (ref 65–99)

## 2017-11-08 LAB — BASIC METABOLIC PANEL
Anion gap: 7 (ref 5–15)
BUN: 6 mg/dL (ref 6–20)
CHLORIDE: 98 mmol/L — AB (ref 101–111)
CO2: 23 mmol/L (ref 22–32)
Calcium: 8.8 mg/dL — ABNORMAL LOW (ref 8.9–10.3)
Creatinine, Ser: 0.52 mg/dL — ABNORMAL LOW (ref 0.61–1.24)
Glucose, Bld: 175 mg/dL — ABNORMAL HIGH (ref 65–99)
POTASSIUM: 4 mmol/L (ref 3.5–5.1)
SODIUM: 128 mmol/L — AB (ref 135–145)

## 2017-11-08 LAB — SODIUM, URINE, RANDOM: SODIUM UR: 52 mmol/L

## 2017-11-08 LAB — OSMOLALITY, URINE: OSMOLALITY UR: 727 mosm/kg (ref 300–900)

## 2017-11-08 LAB — OSMOLALITY: OSMOLALITY: 279 mosm/kg (ref 275–295)

## 2017-11-08 NOTE — Progress Notes (Signed)
Physical Therapy Treatment Patient Details Name: Wyatt Salas MRN: [id number] DOB: 13-Jan-1951 Today's Date: 11/08/2017    History of Present Illness pt was admitted after fall with A Fib with RVR, new Dx of lung CA with pericardiocentesis 4/19.  PMH:  TBI, ETOH, DM, crani.  Had L distal elbow fx last month    PT Comments    Pt very pleasant and wanting to get up to toilet on arrival. Pt with splint replaced as edema is now down on LUE. Pt with improved transfers with limited gait today and denied attempting further gait but did tolerate HEp. Will continue to follow.  SpO2 90-96% on Ra HR 122    Follow Up Recommendations  SNF     Equipment Recommendations  None recommended by PT    Recommendations for Other Services       Precautions / Restrictions Precautions Precautions: Fall Precaution Comments: Per Dr [name], ROM OK to tolerance, WBAT Other Brace/Splint: L wrist splint Restrictions LUE Weight Bearing: Weight bearing as tolerated    Mobility  Bed Mobility Overal bed mobility: Needs Assistance Bed Mobility: Supine to Sit     Supine to sit: Min guard;HOB elevated     General bed mobility comments: HOB 20 degrees with cues for sequence and increased time  Transfers Overall transfer level: Needs assistance   Transfers: Sit to/from [facility] Sit to Stand: Min assist Stand pivot transfers: Min assist       General transfer comment: cues for hand placement, assist to rise from bed and BSC, pt pivoted with RW bed to [facility] then walked 5' to recliner  Ambulation/Gait Ambulation/Gait assistance: Min assist Ambulation Distance (Feet): 5 Feet Assistive device: Rolling walker (2 wheeled) Gait Pattern/deviations: Step-through pattern;Decreased stride length;Trunk flexed;Shuffle   Gait velocity interpretation: <1.31 ft/sec, indicative of household ambulator General Gait Details: shuffling steps with shoes on and assist for balance, fatigues quickly  with declining further activity   Stairs             Wheelchair Mobility    Modified Rankin (Stroke Patients Only)       Balance Overall balance assessment: Needs assistance   Sitting balance-Leahy Scale: Fair       Standing balance-Leahy Scale: Poor                              Cognition Arousal/Alertness: Awake/alert Behavior During Therapy: Flat affect Overall Cognitive Status: Impaired/Different from baseline Area of Impairment: Orientation;Problem solving                 Orientation Level: Time;Disoriented to           Problem Solving: Slow processing        Exercises General Exercises - Lower Extremity Long Arc Quad: AROM;Both;15 reps;Seated Hip Flexion/Marching: AROM;Both;Seated;15 reps    General Comments        Pertinent Vitals/Pain Pain Score: 4  Pain Location: LUE Pain Descriptors / Indicators: Aching;Sore Pain Intervention(s): Limited activity within patient's tolerance    Home Living                      Prior Function            PT Goals (current goals can now be found in the care plan section) Progress towards PT goals: Progressing toward goals    Frequency    Min 2X/week      PT Plan Current plan remains  appropriate    Co-evaluation              AM-PAC PT "6 Clicks" Daily Activity  Outcome Measure  Difficulty turning over in bed (including adjusting bedclothes, sheets and blankets)?: A Little Difficulty moving from lying on back to sitting on the side of the bed? : A Little Difficulty sitting down on and standing up from a chair with arms (e.g., wheelchair, bedside commode, etc,.)?: Unable Help needed moving to and from a bed to chair (including a wheelchair)?: A Little Help needed walking in hospital room?: A Lot Help needed climbing 3-5 steps with a railing? : A Lot 6 Click Score: 14    End of Session Equipment Utilized During Treatment: Gait belt Activity Tolerance:  Patient limited by fatigue Patient left: in chair;with call bell/phone within reach;with chair alarm set;with nursing/sitter in room Nurse Communication: Mobility status PT Visit Diagnosis: Muscle weakness (generalized) (M62.81);Other abnormalities of gait and mobility (R26.89);Unsteadiness on feet (R26.81);Difficulty in walking, not elsewhere classified (R26.2);History of falling (Z91.81)     Time: 8867-7373 PT Time Calculation (min) (ACUTE ONLY): 22 min  Charges:  $Therapeutic Activity: 8-22 mins                    G Codes:       Elwyn Reach, PT [phone]    Jaeli Grubb B Skylinn Vialpando 11/08/2017, 11:04 AM

## 2017-11-08 NOTE — Progress Notes (Addendum)
Progress Note  Patient Name: Wyatt Salas Date of Encounter: 11/08/2017  Primary Cardiologist: Glenetta Hew, MD   Subjective   Patient doing well this am.  Denies any SOB.  Still has some pleuritic CP with deep breathing and changes in body position. Pericardial drain pulled yesterday and repeat limited echo pending to assess for residual effusion.  Remains in atrial flutter with RVR up to the 120's despite IV Amio and soft BP limits addition of BB.  CCB stopped last night due to soft BP.  Pericardial fluid cx with no growth thus far.  Cytology still pending.  Bronchoscopy brushing c/w non-small cell lung CA.    Inpatient Medications    Scheduled Meds: . aspirin  81 mg Oral Daily  . atorvastatin  40 mg Oral q1800  . benzonatate  200 mg Oral BID  . docusate sodium  200 mg Oral BID  . feeding supplement (ENSURE ENLIVE)  237 mL Oral BID BM  . folic acid  1 mg Oral Daily  . heparin  5,000 Units Subcutaneous Q8H  . insulin aspart  0-5 Units Subcutaneous QHS  . insulin aspart  0-9 Units Subcutaneous TID WC  . lacosamide  100 mg Oral BID  . levETIRAcetam  1,500 mg Oral BID  . multivitamin with minerals  1 tablet Oral Daily  . phenytoin  30 mg Oral BID  . sodium chloride flush  3 mL Intravenous Q12H  . tamsulosin  0.4 mg Oral Daily  . thiamine  100 mg Oral Daily   Continuous Infusions: . sodium chloride    . sodium chloride 10 mL/hr at 11/08/17 0600  . amiodarone 30 mg/hr (11/08/17 0600)   PRN Meds: sodium chloride, acetaminophen, diltiazem, guaiFENesin-dextromethorphan, HYDROcodone-acetaminophen, ondansetron **OR** ondansetron (ZOFRAN) IV, phenol, polyethylene glycol, polyvinyl alcohol, sodium chloride flush   Vital Signs    Vitals:   11/08/17 0400 11/08/17 0500 11/08/17 0600 11/08/17 0817  BP: 100/64 111/78 103/69   Pulse:      Resp: 19 17 20    Temp:    98.5 F (36.9 C)  TempSrc:    Oral  SpO2: 97% 97% 95%   Weight:   132 lb 7.9 oz (60.1 kg)   Height:         Intake/Output Summary (Last 24 hours) at 11/08/2017 0839 Last data filed at 11/08/2017 0600 Gross per 24 hour  Intake 510.15 ml  Output 575 ml  Net -64.85 ml   Filed Weights   11/06/17 0500 11/07/17 0632 11/08/17 0600  Weight: 124 lb 12.5 oz (56.6 kg) 127 lb 10.3 oz (57.9 kg) 132 lb 7.9 oz (60.1 kg)    Telemetry    Atrial flutter with RVR up to 120's.   - Personally Reviewed  ECG    No new EKG to review - Personally Reviewed  Physical Exam   GEN: No acute distress.   Neck: No JVD Cardiac: irregularly irregular, no murmurs, rubs, or gallops.  Respiratory: Clear to auscultation bilaterally. GI: Soft, nontender, non-distended  MS: No edema; No deformity. Neuro:  Nonfocal  Psych: Normal affect   Labs    Chemistry Recent Labs  Lab 11/03/17 0425  11/06/17 0301 11/07/17 0848 11/08/17 0219  NA 133*   < > 131* 129* 128*  K 4.8   < > 4.2 4.5 4.0  CL 99*   < > 100* 98* 98*  CO2 22   < > 21* 23 23  GLUCOSE 163*   < > 161* 218* 175*  BUN 9   < >  6 9 6   CREATININE 0.57*   < > 0.51* 0.58* 0.52*  CALCIUM 9.4   < > 8.8* 9.1 8.8*  PROT 7.0  --   --   --   --   ALBUMIN 2.4*  --   --   --   --   AST 57*  --   --   --   --   ALT 53  --   --   --   --   ALKPHOS 88  --   --   --   --   BILITOT 0.3  --   --   --   --   GFRNONAA >60   < > >60 >60 >60  GFRAA >60   < > >60 >60 >60  ANIONGAP 12   < > 10 8 7    < > = values in this interval not displayed.     Hematology Recent Labs  Lab 10/29/2017 1852 11/06/17 0301 11/07/17 0848  WBC 9.4 7.6 10.3  RBC 4.14* 4.10* 4.37  HGB 10.8* 10.6* 11.0*  HCT 33.2* 33.0* 35.5*  MCV 80.2 80.5 81.2  MCH 26.1 25.9* 25.2*  MCHC 32.5 32.1 31.0  RDW 15.1 15.0 15.1  PLT 441* 471* 497*    Cardiac EnzymesNo results for input(s): TROPONINI in the last 168 hours. No results for input(s): TROPIPOC in the last 168 hours.   BNPNo results for input(s): BNP, PROBNP in the last 168 hours.   DDimer No results for input(s): DDIMER in the last  168 hours.   Radiology    Dg Chest Port 1 View  Result Date: 11/07/2017 CLINICAL DATA:  Pleural effusion. EXAM: PORTABLE CHEST 1 VIEW COMPARISON:  Multiple prior studies including CT 10/28/2017 FINDINGS: The heart margins are obscured by significant opacity in the RIGHT hemithorax. A RIGHT-sided small bore pleural drain is in place, overlying the heart and RIGHT lung base. There is increased opacity in the RIGHT hemithorax, consistent with pleural effusion and parenchymal opacity. There is volume loss in the RIGHT lung. LEFT basilar opacity partially obscures the hemidiaphragm. Multiple LEFT-sided skin folds. No pneumothorax. There are distended loops of bowel in the LEFT UPPER QUADRANT the abdomen. Remote bilateral rib fractures. IMPRESSION: 1. Increased opacity in the RIGHT lung. 2. Stable LEFT basilar opacity. Electronically Signed   By: Nolon Nations M.D.   On: 11/07/2017 10:49    Cardiac Studies   2D echo 11/03/2017 Study Conclusions  - Left ventricle: The cavity size was normal. Wall thickness was   normal. Systolic function was mildly to moderately reduced. The   estimated ejection fraction was in the range of 40% to 45%.   Diffuse hypokinesis. Features are consistent with a pseudonormal   left ventricular filling pattern, with concomitant abnormal   relaxation and increased filling pressure (grade 2 diastolic   dysfunction). - Mitral valve: There was mild regurgitation. - Tricuspid valve: There was trivial regurgitation. - Pericardium, extracardiac: A large pericardial effusion (up to   3cm) was identified circumferential to the heart. The fluid had   no internal echoes. No RA or RV collapse. IVC is small in   diameter but does not demonstrate respiratory variation. There   was no significant evidence of hemodynamic compromise.  Impressions:  - Pericardial effusion is new when compared to prior.  2D echo limited 4/20 Study Conclusions  - Left ventricle: The cavity  size was mildly dilated. The estimated   ejection fraction was 40%. Diffuse hypokinesis. - Mitral valve: There was  mild regurgitation. - Pericardium, extracardiac: Post pericardiocentesis much improved   with trivial residual effusion   Patient Profile     67 y.o. male with a hx of high fall risk with multiple falls in the past 2 months with multiple fractures, wheel chair dependent, remote hx of subdural hematoma s/p right sided craniotomy (?2000), seizure disorder, alcohol abuse, chronic hep C, HTN, HDL, chronic diastolic heart failure, and lung mass concerning for cancernow with a flutter RVR found on admit for fall.  Assessment & Plan    1.  Atrial flutter with RVR - Cardizem short acting started yesterday due to ot much improvement in HR with dig and BB. -Last night Cardizem stopped and started on IV Amio for better rate control.   - HR still elevated so will give another bolus of Amio 150mg  IV now.   - CHADS2VASC score is elevated at 56 (age >37, HTN, DM, CHF) but not a candidate for anticoagulation due to history of multiple falls resulting in fractures as well as h/o subdural hematoma and bloody pericardial effusion.   2.  Large pericardial effusion with tamponade s/p pericardiocentesis  - 750cc bloody fluid removed.  Cytology pending but likely malignant from lung CA - drain removed yesterday and limited echo to assess residual fluid is pending. Echo yesterday with trivial effusion.  3.  Chronic combined systolic/diastolic CHF - 2D echo this admit showed EF 40-45% with G2DD - possibly tachy mediated from rapid atrial flutter - hopefully EF will improve with HR control.   - cannot add BB/ACE I or ARB due to soft BP - not a cath candidate at this point due to likely metastatic lung CA and inability to use antiplatelet agents if he needed PCI. - he is net neg 1.16L and appears euvolemic on exam  4.  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   - he wants everything done at this point.    I have spent a total of 35 minutes with patient reviewing hospital notes, echoes , telemetry, EKGs, labs and examining patient as well as establishing an assessment and plan that was discussed with the patient.  > 50% of time was spent in direct patient care.    For questions or updates, please contact [place] Please consult [email or web address] for contact info under Cardiology/STEMI.      Signed, Fransico Him, MD  11/08/2017, 8:39 AM

## 2017-11-08 NOTE — Progress Notes (Signed)
Daily Progress Note   Patient Name: Wyatt Salas       Date: 11/08/2017 DOB: 09/25/1950  Age: 67 y.o. MRN#: [id number] Attending Physician: Wyatt Aloe, MD Primary Care Physician: Wyatt Lema, MD Admit Date: 11/01/2017  Reason for Consultation/Follow-up: Establishing goals of care and Psychosocial/spiritual support  Subjective: Awake alert sitting in chair Has just finished giving his order for lunch through cafeteria Denies dyspnea or pain Appears weak, is not able to speak in full sentences for long "they told me they're sending me to cancer center. Which way is it?"      Length of Stay: 12  Current Medications: Scheduled Meds:  . aspirin  81 mg Oral Daily  . atorvastatin  40 mg Oral q1800  . benzonatate  200 mg Oral BID  . docusate sodium  200 mg Oral BID  . feeding supplement (ENSURE ENLIVE)  237 mL Oral BID BM  . folic acid  1 mg Oral Daily  . heparin  5,000 Units Subcutaneous Q8H  . insulin aspart  0-5 Units Subcutaneous QHS  . insulin aspart  0-9 Units Subcutaneous TID WC  . lacosamide  100 mg Oral BID  . levETIRAcetam  1,500 mg Oral BID  . multivitamin with minerals  1 tablet Oral Daily  . phenytoin  30 mg Oral BID  . sodium chloride flush  3 mL Intravenous Q12H  . tamsulosin  0.4 mg Oral Daily  . thiamine  100 mg Oral Daily    Continuous Infusions: . sodium chloride    . sodium chloride 10 mL/hr at 11/08/17 1100  . amiodarone 30 mg/hr (11/08/17 1100)    PRN Meds: sodium chloride, acetaminophen, diltiazem, guaiFENesin-dextromethorphan, HYDROcodone-acetaminophen, ondansetron **OR** ondansetron (ZOFRAN) IV, phenol, polyethylene glycol, polyvinyl alcohol, sodium chloride flush  Physical Exam  Constitutional:  Frail older man seen in cardiac ICU.  He  appears short of breath, cough  HENT:  Temporal wasting  Neck: Normal range of motion.  Cardiovascular: Normal rate.  Pulmonary/Chest:  Becomes short of breath after speaking for few minutes   Wyatt cough  Musculoskeletal: Normal range of motion.  Neurological: He is alert.  Skin: Skin is warm and Wyatt.  Psychiatric: He has a normal mood and affect. His behavior is normal. Thought content normal.  Nursing note and vitals reviewed.  Sitting up in chair this am.   Vital Signs: BP 110/72   Pulse (!) 122   Temp 98.5 F (36.9 C) (Oral)   Resp 20   Ht 6\' 1"  (1.854 m)   Wt 60.1 kg (132 lb 7.9 oz)   SpO2 96%   BMI 17.48 kg/m  SpO2: SpO2: 96 % O2 Device: O2 Device: Room Air O2 Flow Rate: O2 Flow Rate (L/min): 2 L/min  Intake/output summary:   Intake/Output Summary (Last 24 hours) at 11/08/2017 1137 Last data filed at 11/08/2017 1100 Gross per 24 hour  Intake 763.65 ml  Output 575 ml  Net 188.65 ml   LBM: Last BM Date: 11/04/17 Baseline Weight: Weight: 68 kg (150 lb) Most recent weight: Weight: 60.1 kg (132 lb 7.9 oz)       Palliative Assessment/Data:    Flowsheet Rows     Most Recent Value  Intake Tab  Referral Department  Hospitalist  Unit at Time of Referral  Med/Surg Unit  Palliative Care Primary Diagnosis  Cancer  Date Notified  10/28/17  Palliative Care Type  New Palliative care  Reason for referral  Clarify Goals of Care  Date of Admission  11/07/2017  Date first seen by Palliative Care  10/29/17  # of days Palliative referral response time  1 Day(s)  # of days IP prior to Palliative referral  2  Clinical Assessment  Palliative Performance Scale Score  40%  Pain Max last 24 hours  6  Pain Min Last 24 hours  0  Psychosocial & Spiritual Assessment  Palliative Care Outcomes  Patient/Family meeting held?  Yes  Who was at the meeting?  patient  Palliative Care Outcomes  Clarified goals of care      Patient Active Problem List   Diagnosis Date Noted  .  Goals of care, counseling/discussion   . Palliative care by specialist   . Cardiac/pericardial tamponade   . Acute combined systolic and diastolic heart failure (Wyatt Salas)   . Protein-calorie malnutrition, severe 10/28/2017  . Acute urinary retention 10/27/2017  . Atrial fibrillation with rapid ventricular response (Wyatt Salas) 10/27/2017  . Chronic diastolic CHF (congestive heart failure) (Wyatt Salas) 10/27/2017  . Closed fracture of distal end of left humerus 10/27/2017  . Mass of upper lobe of right lung   . New onset atrial fibrillation (Wyatt Salas)   . Abnormal liver function   . Lung mass   . Essential hypertension   . Altered mental status 05/18/2017  . Right sided weakness 10/14/2016  . Acute urinary tract infection 10/14/2016  . Elevated troponin 10/14/2016  . Type 2 diabetes mellitus with hyperlipidemia ([place]) 10/14/2016  . Fall at home--mechanical 01/26/2014  . Elevated CK 01/26/2014  . Seizure disorder (Wyatt Salas) 07/26/2013  . NSTEMI (non-ST elevated myocardial infarction) (Wyatt Salas) 07/26/2013  . Hyperlipidemia 07/26/2013  . Aspiration pneumonia (Wyatt Salas) 07/26/2013  . 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  Await results of cytology for decision regarding possible need for bronchoscopy.  [name] has undergone bronchoscopy and unfortunately was found to have non-small cell lung cancer.  On 10/30/2017, he underwent pericardiocentesis at [facility]. He is now un 2H/cardiac ICU.  Consult ordered for goals of care   Assessment:  [name] continues to verbalize that he wants to get better and be cured from his cancer, does not wish  to have discussions about what his wishes would be if a cure weren't possible. Continues to also deflect code status discussions. Doesn't want me to contact his sister, states that she isn't actually his sister, refuses to name a next of kin, wants to talk with social worker at the hospital. Is asking when he will go to the cancer center.    Recommendations/Plan:  Palliative medicine will be glad to continue to be a source of support as well as engage patient as he becomes more open to goals of care conversations  This likely though, will need further goals of care discussions after his discharge.  He is going to be affiliated with the oncology center through [facility].  The oncology department has an embedded palliative medicine provider that sees clients on Mondays.  Another option would be the palliative medicine division of [facility] which can be reached at [phone]; or [facility], a division of [facility], [phone]  Mr. Negro would qualify for his hospice benefit.  At this point however he is not open to discussing issues around end-of-life, code status. Mr. Routt is also affiliated with the [place], who could be another source of support as this goes forward.  Await pleural fluid and peri cardial fluid cytology results. Remains full code for now.   Goals of Care and Additional Recommendations:  Limitations on Scope of Treatment: Full Scope Treatment  Code Status:    Code Status Orders  (From admission, onward)        Start     Ordered   10/27/17 0548  Full code  Continuous     10/27/17 0549    Code Status History    Date Active Date Inactive Code Status Order ID Comments User Context   05/18/2017 2330 05/24/2017 2201 Full Code [id number]  Jani Gravel, MD Inpatient   10/14/2016 2118 10/15/2016 1921 Full Code [id number]  Toy Baker, MD Inpatient   01/25/2014 2010 01/26/2014 2035 Full Code [id number]  Corky Sox, MD  Inpatient   07/27/2013 0019 07/31/2013 1533 Full Code [id number]  Orvan Falconer, MD Inpatient       Prognosis:   < 6 months in the setting of newly diagnosed non-small cell lung cancer; protein calorie malnutrition with an albumin of 2.4, BMI 16.46  Discharge Planning:  [place] for rehab with Palliative care service follow-up Depending on hospital course, this projected disposition might change.   Care plan was discussed with patient.   Thank you for allowing the Palliative Medicine Team to assist in the care of this patient.   Time In: 1100 Time Out: 1130 Total Time 30 min Prolonged Time Billed  no       Greater than 50%  of this time was spent counseling and coordinating care related to the above assessment and plan.  Loistine Chance, MD [phone]  Please contact Palliative Medicine Team phone at [phone] for questions and concerns.

## 2017-11-08 NOTE — Progress Notes (Signed)
PROGRESS NOTE    Wyatt Salas  MWU:[id number] DOB: 17-Nov-1950 DOA: 10/29/2017 PCP: [name], MD   Brief Narrative: Wyatt Salas is a 67 y.o. male with a history of alcohol use, seizure disorder, DM-2, hypertension, known lung mass. He presented secondary to a fall, found to be in atrial fibrillation with RVR. 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 Biopsy pending. atrial fib/flutter difficult to control. Cardiology on board.  S/p pericardiocentesis on 4/19. Plan for SNF on discharge.    Assessment & Plan:   Principal Problem:   Atrial fibrillation with rapid ventricular response (HCC) Active Problems:   Seizure disorder (HCC)   Type 2 diabetes mellitus with hyperlipidemia (HCC)   Lung mass   Essential hypertension   Acute urinary retention   Chronic diastolic CHF (congestive heart failure) (HCC)   Closed fracture of distal end of left humerus   Protein-calorie malnutrition, severe   Acute combined systolic and diastolic heart failure (HCC)   Cardiac/pericardial tamponade   Goals of care, counseling/discussion   Palliative care by specialist   Atrial flutter w/ RVR Improved heart rate yesterday, however, with tachycardia this morning. Telemetry reviewed, and appears to still be in atrial flutter. CHA2DS2-VASc Score is 3. Complicated by mild hypotension this morning. Taken off Cardizem drip this morning. -Cardiology recommendations: Amiodarone, metoprolol, digoxin; await recs today  Non-small cell carcinoma Patient is s/p bronchoscopy with bronchial brushing significant for non-small cell carcinoma. Biopsy confirms carcinoma. Pleural effusion was transudative with atypical cells. -Discussed with Oncology on 4/17, [name], who will arrange for either her or [name] to see patient as an outpatient. -Called Oncology again since patient has a prolonged hospital stay. They will come to see him in the  hospital.  Pericardial effusion 3cm on Transthoracic Echocardiogram. Patient tachycardic in setting of afib/flutter with hypotension in setting of diltiazem drip/metoprolol. ?malignant. S/p pericardiocentesis on 4/19. Drain with 20 ml out in last 24 hours. -Cytology/culture (no growth to date) pending  Essential hypertension Stable. Blood pressure readings from right forearm -Per cardiology as mentioned above  Dysphagia -Dysphagia 3 diet per speech therapy recommendations  Left humerus fracture Secondary to fall. Outpatient follow-up with Dr. [name].  History of alcohol use Out of window for DTs  Deconditioning Frequent falls -PT eval: SNF  Left ankle pain Likely arthritic. Symptomatic management.  Left heel pain S/p olecranon fracture in 2012 s/p repair with screw -Prevalon boots to help with pressure relief  Acute urinary retention Continue Flomax. Foley placed on 4/13. Plan for outpatient urology follow-up.  Decreased urine output 575 mL of urine over last 24 hours. No urinary symptoms. Appears euvolemic on exam. Poor oral intake yesterday. -Encourage good oral intake of fluids -Monitor urine output and fluid intake closely today  Diabetes mellitus, type 2 -Continue SSI  Seizure disorder -Continue Keppra and Dilantin  Chronic combined systolic and diastolic heart failure EF of 40-45% with grade 2 diastolic dysfunction. Diffuse hypokinesis. Weight is up 3 lbs from yesterday, but has been stable. -Daily weights -Strict in/out  Right chest pain Cough present, however, cough is not new. Pain is pleuritic. Chest x-ray significant for increased airspace opacity on 4/19. Still with cough and mild production. Afebrile. No leukocytosis. Pulmonology consulted for repeat thoracentesis  Hyponatremia Patient is euvolemic on exam. Decreased urine output. ?SIADH in setting of cancer. -urine sodium/osmolality -serum osmolality   DVT prophylaxis: Lovenox Code Status:   Code  Status: Full Code Family Communication: None  at bedside Disposition Plan: Discharge to SNF when atrial flutter/rvr improved   Consultants:   Pulmonology  Cardiology  Palliative care medicine  Oncology (telepone)  Procedures:   Bronchoscopy (10/25/2017)  Transthoracic Echocardiogram (11/03/17) Study Conclusions - Left ventricle: The cavity size was normal. Wall thickness was normal. Systolic function was mildly to moderately reduced. The estimated ejection fraction was in the range of 40% to 45%. Diffuse hypokinesis. Features are consistent with a pseudonormal left ventricular filling pattern, with concomitant abnormal relaxation and increased filling pressure (grade 2 diastolic dysfunction). - Mitral valve: There was mild regurgitation. - Tricuspid valve: There was trivial regurgitation. - Pericardium, extracardiac: A large pericardial effusion (up to 3cm) was identified circumferential to the heart. The fluid had no internal echoes. No RA or RV collapse. IVC is small in diameter but does not demonstrate respiratory variation. There was no significant evidence of hemodynamic compromise.  Impressions: - Pericardial effusion is new when compared to prior.  Pericardiocentesis (4/19)   Antimicrobials:  None    Subjective: Left heel pain.  Objective: Vitals:   11/08/17 1000 11/08/17 1015 11/08/17 1030 11/08/17 1057  BP: 100/67 100/76 110/72   Pulse:    (!) 122  Resp: (!) 27 (!) 23 20   Temp:      TempSrc:      SpO2: 95% 97% 96% 96%  Weight:      Height:        Intake/Output Summary (Last 24 hours) at 11/08/2017 1110 Last data filed at 11/08/2017 1100 Gross per 24 hour  Intake 763.65 ml  Output 575 ml  Net 188.65 ml   Filed Weights   11/06/17 0500 11/07/17 0632 11/08/17 0600  Weight: 56.6 kg (124 lb 12.5 oz) 57.9 kg (127 lb 10.3 oz) 60.1 kg (132 lb 7.9 oz)    Examination:  General: Frail appearing, no distress HEENT: moist  oropharynx Respiratory system: Decreased breath sounds bilaterally, worse on right Cardiovascular system: fast rate and regular rhythm. Normal S1 and S2. No heart murmurs present. No extra heart sounds, 1+ radial pulses Gastrointestinal system: Soft, non-tender, non-distended, no guarding, no rebound, no masses felt Central nervous system: Alert, oriented, left arm with 4/5 grip strength. 2/5 arm strength Extremities: No edema. No calf tenderness. Left heel without evidence of pressure ulcer; tenderness Skin: No cyanosis. No rashes, normal skin turgor Psychiatry: judgement, insight normal. Mood slightly depressed with flat affect.    Data Reviewed: I have personally reviewed following labs and imaging studies  CBC: Recent Labs  Lab 11/03/17 0425 10/18/2017 1852 11/06/17 0301 11/07/17 0848  WBC 11.3* 9.4 7.6 10.3  HGB 11.1* 10.8* 10.6* 11.0*  HCT 36.1* 33.2* 33.0* 35.5*  MCV 81.5 80.2 80.5 81.2  PLT 462* 441* 471* 774*   Basic Metabolic Panel: Recent Labs  Lab 11/03/17 0425 11/04/17 1301 10/25/2017 1852 11/06/17 0301 11/07/17 0848 11/08/17 0219  NA 133* 131*  --  131* 129* 128*  K 4.8 4.2  --  4.2 4.5 4.0  CL 99* 99*  --  100* 98* 98*  CO2 22 22  --  21* 23 23  GLUCOSE 163* 144*  --  161* 218* 175*  BUN 9 11  --  6 9 6   CREATININE 0.57* 0.49* 0.55* 0.51* 0.58* 0.52*  CALCIUM 9.4 9.0  --  8.8* 9.1 8.8*  MG 1.8 1.9  --   --   --   --    GFR: Estimated Creatinine Clearance: 75.1 mL/min (A) (by C-G formula based on SCr of  0.52 mg/dL (L)). Liver Function Tests: Recent Labs  Lab 11/03/17 0425  AST 57*  ALT 53  ALKPHOS 88  BILITOT 0.3  PROT 7.0  ALBUMIN 2.4*   No results for input(s): LIPASE, AMYLASE in the last 168 hours. No results for input(s): AMMONIA in the last 168 hours. Coagulation Profile: No results for input(s): INR, PROTIME in the last 168 hours. Cardiac Enzymes: No results for input(s): CKTOTAL, CKMB, CKMBINDEX, TROPONINI in the last 168 hours. BNP  (last 3 results) No results for input(s): PROBNP in the last 8760 hours. HbA1C: No results for input(s): HGBA1C in the last 72 hours. CBG: Recent Labs  Lab 11/07/17 1153 11/07/17 1712 11/07/17 2059 11/07/17 2122 11/08/17 0813  GLUCAP 172* 180* 161* 157* 185*   Lipid Profile: No results for input(s): CHOL, HDL, LDLCALC, TRIG, CHOLHDL, LDLDIRECT in the last 72 hours. Thyroid Function Tests: No results for input(s): TSH, T4TOTAL, FREET4, T3FREE, THYROIDAB in the last 72 hours. Anemia Panel: No results for input(s): VITAMINB12, FOLATE, FERRITIN, TIBC, IRON, RETICCTPCT in the last 72 hours. Sepsis Labs: No results for input(s): PROCALCITON, LATICACIDVEN in the last 168 hours.  Recent Results (from the past 240 hour(s))  Body fluid culture     Status: None (Preliminary result)   Collection Time: 11/09/2017  4:33 PM  Result Value Ref Range Status   Specimen Description PERICARDIAL [facility]  Final   Special Requests NONE  Final   Gram Stain   Final    FEW WBC PRESENT, PREDOMINANTLY PMN NO ORGANISMS SEEN    Culture   Final    NO GROWTH 2 DAYS Performed at [facility], [address] [place], [place] [address]    Report Status PENDING  Incomplete         Radiology Studies: Dg Chest Port 1 View  Result Date: 11/07/2017 CLINICAL DATA:  Pleural effusion. EXAM: PORTABLE CHEST 1 VIEW COMPARISON:  Multiple prior studies including CT 10/23/2017 FINDINGS: The heart margins are obscured by significant opacity in the RIGHT hemithorax. A RIGHT-sided small bore pleural drain is in place, overlying the heart and RIGHT lung base. There is increased opacity in the RIGHT hemithorax, consistent with pleural effusion and parenchymal opacity. There is volume loss in the RIGHT lung. LEFT basilar opacity partially obscures the hemidiaphragm. Multiple LEFT-sided skin folds. No pneumothorax. There are distended loops of bowel in the LEFT UPPER QUADRANT the abdomen. Remote bilateral rib fractures.  IMPRESSION: 1. Increased opacity in the RIGHT lung. 2. Stable LEFT basilar opacity. Electronically Signed   By: Nolon Nations M.D.   On: 11/07/2017 10:49        Scheduled Meds: . aspirin  81 mg Oral Daily  . atorvastatin  40 mg Oral q1800  . benzonatate  200 mg Oral BID  . docusate sodium  200 mg Oral BID  . feeding supplement (ENSURE ENLIVE)  237 mL Oral BID BM  . folic acid  1 mg Oral Daily  . heparin  5,000 Units Subcutaneous Q8H  . insulin aspart  0-5 Units Subcutaneous QHS  . insulin aspart  0-9 Units Subcutaneous TID WC  . lacosamide  100 mg Oral BID  . levETIRAcetam  1,500 mg Oral BID  . multivitamin with minerals  1 tablet Oral Daily  . phenytoin  30 mg Oral BID  . sodium chloride flush  3 mL Intravenous Q12H  . tamsulosin  0.4 mg Oral Daily  . thiamine  100 mg Oral Daily   Continuous Infusions: . sodium chloride    .  sodium chloride 10 mL/hr at 11/08/17 1100  . amiodarone 30 mg/hr (11/08/17 1100)     LOS: 12 days     Cordelia Poche, MD [facility] 11/08/2017, 11:10 AM Pager: [phone]  If 7PM-7AM, please contact night-coverage [email or web address] Password [email or web address] 11/08/2017, 11:10 AM

## 2017-11-08 NOTE — Care Management Note (Signed)
Case Management Note Marvetta Gibbons RN, BSN Unit 4E-Case Manager-- [place] coverage [phone]  Patient Details  Name: Wyatt Salas MRN: [id number] Date of Birth: [birth year]  Subjective/Objective:  Pt  presented secondary to a fall, found to be in atrial fibrillation with RVR. 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 Biopsy pending.  S/p pericardiocentesis on 4/19.                 Action/Plan: PTA pt lived at home- per PT eval recommendation for SNF- CSW following for placement needs-   Expected Discharge Date:                 Expected Discharge Plan:  Skilled Nursing Facility  In-House Referral:  Clinical Social Work  Discharge planning Services  CM Consult  Post Acute Care Choice:    Choice offered to:     DME Arranged:    DME Agency:     HH Arranged:    [place] Agency:     Status of Service:  In process, will continue to follow  If discussed at Long Length of Stay Meetings, dates discussed:    Discharge Disposition:   Additional Comments:  Wyatt Patricia, RN 11/08/2017, 10:31 AM

## 2017-11-09 ENCOUNTER — Inpatient Hospital Stay (HOSPITAL_COMMUNITY): Payer: Medicare HMO

## 2017-11-09 ENCOUNTER — Encounter: Payer: Self-pay | Admitting: Internal Medicine

## 2017-11-09 ENCOUNTER — Encounter: Payer: Self-pay | Admitting: *Deleted

## 2017-11-09 DIAGNOSIS — J910 Malignant pleural effusion: Secondary | ICD-10-CM

## 2017-11-09 DIAGNOSIS — Z87820 Personal history of traumatic brain injury: Secondary | ICD-10-CM

## 2017-11-09 DIAGNOSIS — M79672 Pain in left foot: Secondary | ICD-10-CM

## 2017-11-09 DIAGNOSIS — I3131 Malignant pericardial effusion in diseases classified elsewhere: Secondary | ICD-10-CM

## 2017-11-09 DIAGNOSIS — I10 Essential (primary) hypertension: Secondary | ICD-10-CM

## 2017-11-09 DIAGNOSIS — E871 Hypo-osmolality and hyponatremia: Secondary | ICD-10-CM

## 2017-11-09 DIAGNOSIS — I318 Other specified diseases of pericardium: Secondary | ICD-10-CM

## 2017-11-09 DIAGNOSIS — R590 Localized enlarged lymph nodes: Secondary | ICD-10-CM

## 2017-11-09 DIAGNOSIS — C801 Malignant (primary) neoplasm, unspecified: Secondary | ICD-10-CM

## 2017-11-09 DIAGNOSIS — E119 Type 2 diabetes mellitus without complications: Secondary | ICD-10-CM

## 2017-11-09 DIAGNOSIS — G40909 Epilepsy, unspecified, not intractable, without status epilepticus: Secondary | ICD-10-CM

## 2017-11-09 DIAGNOSIS — C3411 Malignant neoplasm of upper lobe, right bronchus or lung: Secondary | ICD-10-CM

## 2017-11-09 DIAGNOSIS — R0789 Other chest pain: Secondary | ICD-10-CM

## 2017-11-09 DIAGNOSIS — F1020 Alcohol dependence, uncomplicated: Secondary | ICD-10-CM

## 2017-11-09 DIAGNOSIS — M25572 Pain in left ankle and joints of left foot: Secondary | ICD-10-CM

## 2017-11-09 DIAGNOSIS — I313 Pericardial effusion (noninflammatory): Secondary | ICD-10-CM

## 2017-11-09 DIAGNOSIS — S42302A Unspecified fracture of shaft of humerus, left arm, initial encounter for closed fracture: Secondary | ICD-10-CM

## 2017-11-09 DIAGNOSIS — Z91013 Allergy to seafood: Secondary | ICD-10-CM

## 2017-11-09 DIAGNOSIS — Z87891 Personal history of nicotine dependence: Secondary | ICD-10-CM

## 2017-11-09 DIAGNOSIS — R1310 Dysphagia, unspecified: Secondary | ICD-10-CM

## 2017-11-09 LAB — BODY FLUID CULTURE: CULTURE: NO GROWTH

## 2017-11-09 LAB — CBC
HCT: 32.5 % — ABNORMAL LOW (ref 39.0–52.0)
HEMOGLOBIN: 10.2 g/dL — AB (ref 13.0–17.0)
MCH: 25.2 pg — AB (ref 26.0–34.0)
MCHC: 31.4 g/dL (ref 30.0–36.0)
MCV: 80.4 fL (ref 78.0–100.0)
PLATELETS: 587 10*3/uL — AB (ref 150–400)
RBC: 4.04 MIL/uL — AB (ref 4.22–5.81)
RDW: 15 % (ref 11.5–15.5)
WBC: 11.7 10*3/uL — ABNORMAL HIGH (ref 4.0–10.5)

## 2017-11-09 LAB — BASIC METABOLIC PANEL
ANION GAP: 8 (ref 5–15)
BUN: 8 mg/dL (ref 6–20)
CALCIUM: 8.8 mg/dL — AB (ref 8.9–10.3)
CO2: 24 mmol/L (ref 22–32)
CREATININE: 0.52 mg/dL — AB (ref 0.61–1.24)
Chloride: 97 mmol/L — ABNORMAL LOW (ref 101–111)
GFR calc non Af Amer: >60 mL/min (ref >60)
GLUCOSE: 202 mg/dL — AB (ref 65–99)
Potassium: 4.1 mmol/L (ref 3.5–5.1)
Sodium: 129 mmol/L — ABNORMAL LOW (ref 135–145)

## 2017-11-09 LAB — GLUCOSE, CAPILLARY
GLUCOSE-CAPILLARY: 189 mg/dL — AB (ref 65–99)
GLUCOSE-CAPILLARY: 177 mg/dL — AB (ref 65–99)
Glucose-Capillary: 245 mg/dL — ABNORMAL HIGH (ref 65–99)
Glucose-Capillary: 208 mg/dL — ABNORMAL HIGH (ref 65–99)
Glucose-Capillary: 176 mg/dL — ABNORMAL HIGH (ref 65–99)

## 2017-11-09 LAB — ECHOCARDIOGRAM LIMITED
Height: 73 in
Weight: 2257.51 oz

## 2017-11-09 MED ORDER — AMIODARONE LOAD VIA INFUSION
150.0000 mg | Freq: Once | INTRAVENOUS | Status: AC
  Administered 2017-11-09: 150 mg via INTRAVENOUS
  Filled 2017-11-09: qty 83.34

## 2017-11-09 NOTE — Progress Notes (Signed)
[place] [phone]   [phone]  CONSULT NOTE  REFERRING PHYSICIAN: Dr. Cordelia Poche  REASON FOR CONSULTATION:  67 years old African-American male recently diagnosed with lung cancer.  HPI Wyatt Salas is a 67 y.o. male with past medical history significant for multiple medical problems including history of diabetes mellitus, diastolic dysfunction, seizure disorder, syncopal episode as well as traumatic brain injury and rhabdomyolysis as well as paralysis of the left upper extremity secondary to take bite several years ago.  The patient also has a long history of smoking.  The patient was admitted to [facility] on 10/27/2017 after a fall at home with pain in the left forehead as well as left shoulder.  He mentioned that he has dizzy spells at home and fell from his wheelchair.  He was also complaining of shortness of breath.  He has a known right upper lobe lung mass and he was seen by pulmonary medicine several months ago and had a PET scan performed on May 31, 2017 that showed a spiculated hypermetabolic 3.6 cm right upper lobe mass with a small sub-attending peribronchovascular hypermetabolic lymph nodes and associated hypermetabolic right hilar and right lower paratracheal adenopathy as well as questionably hypermetabolic a small prevascular lymph node in the upper mediastinum this was suspicious for T2 a, N2, M0 disease at that time.  I am not sure what kind of follow-up or further investigation was done for the patient since that time.  After admission to the hospital he had CT scan of the head without contrast that showed no concerning findings in the brain.  The patient also had CT scan of the chest, abdomen and pelvis performed on 11/11/2017 and that showed interval increase in the spiculated right upper lobe lung mass which measured 5.8 x 5.3 x 5.0 cm with progression of the metastatic mediastinal and hilar adenopathy as well as  development of satellite pulmonary nodules in the right lung.  There was slight luminal narrowing of the distal right mainstem bronchus due to adenopathy.  There was no occlusion noted.  There was also opioid density and the left major fissure suspicious for a pseudo-lesion with fluid in the fissure versus intrafissural mass measuring 2.2 x 1.2 x 1.7 cm.  The scan also showed loculated moderate right pleural effusion. The patient was seen by Dr. Lamonte Sakai during his hospitalization and he underwent bronchoscopy with transbronchial biopsy of the right upper lobe lung mass. The final pathology ([id number]) of the endobronchial biopsy of the right upper lobe orifice showed poorly differentiated carcinoma.  The immunohistochemical stains showed the following pattern of staining and the tumor cells including positive CK7 and CK 5/6 but negative CK 20, TTF-1, Napsin-A, p63, calretinin and the 2-40 as well as synaptophysin.  The immunohistochemical profile is not specific but the patchy CK7 staining and focal labeling for CK 5/6 may favor squamous cell carcinoma but not diagnostic. 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 The patient also has atrial fibrillation and he was transferred to [facility] for management of his cardiac condition as well as the pericardial window. I was consulted today to evaluate the patient and discussed with him his treatment options. When seen today he is feeling fine except for the fatigue and shortness of breath.  He also has intermittent cough.  He denied having any fever or chills.  He has no nausea or vomiting. He has no family history of malignancy. The patient is single and has 1 son and brother who live in [place].  He would like to move to [place] for his treatment.  He is a [place] veterans and would like to receive his  care from the [place] [facility] in [place].  He does not have any family member in [place].  He used to work as a Training and development officer.  He has a long history of smoking as well as a smoking marijuana.  HPI  Past Medical History:  Diagnosis Date  . Diabetes mellitus without complication ([place])    Type II  . Diastolic dysfunction    a. ECHO 07/2013 EF 55-60% and grade 1 diastolic dysfunction  . NSTEMI (non-ST elevated myocardial infarction) ([place])    problem list 01/2014  . Rhabdomyolysis 01/2014  . Seizures ([place])   . Syncope 01/2014  . Traumatic brain injury [facility])     Past Surgical History:  Procedure Laterality Date  . CRANIOTOMY    . PERICARDIOCENTESIS N/A 10/27/2017   Procedure: PERICARDIOCENTESIS;  Surgeon: Jettie Booze, MD;  Location: [facility];  Service: Cardiovascular;  Laterality: N/A;  . VIDEO BRONCHOSCOPY Bilateral 10/20/2017   Procedure: VIDEO BRONCHOSCOPY WITH FLUORO;  Surgeon: Collene Gobble, MD;  Location: [facility];  Service: Cardiopulmonary;  Laterality: Bilateral;    Family History  Problem Relation Age of Onset  . Headache Mother   . Diabetes Neg Hx   . Stroke Neg Hx     Social History Social History   Tobacco Use  . Smoking status: Former Smoker    Types: Cigars  . Smokeless tobacco: Never Used  Substance Use Topics  . Alcohol use: No    Frequency: Never  . Drug use: Yes    Types: Marijuana    Allergies  Allergen Reactions  . Shellfish Allergy Rash    Current Facility-Administered Medications  Medication Dose Route Frequency Provider Last Rate Last Dose  . 0.9 %  sodium chloride infusion   Intravenous Continuous Cecilie Kicks R, NP      . 0.9 %  sodium chloride infusion  250 mL Intravenous PRN Jettie Booze, MD 10 mL/hr at 11/08/17 1200    . acetaminophen (TYLENOL) tablet 650 mg  650 mg Oral Q4H PRN Jettie Booze, MD      . amiodarone (NEXTERONE PREMIX) 360-4.14 MG/200ML-% (1.8 mg/mL) IV infusion  30 mg/hr Intravenous Continuous  Bensimhon, Shaune Pascal, MD 16.7 mL/hr at 11/09/17 1521 30 mg/hr at 11/09/17 1521  . aspirin chewable tablet 81 mg  81 mg Oral Daily Mariel Aloe, MD   81 mg at 11/09/17 0957  . atorvastatin (LIPITOR) tablet 40 mg  40 mg Oral q1800 Mariel Aloe, MD   40 mg at 11/08/17 1806  . benzonatate (TESSALON) capsule 200 mg  200 mg Oral BID Mariel Aloe, MD   200 mg at 11/09/17 0957  . diltiazem (CARDIZEM) injection 10 mg  10 mg Intravenous Q6H PRN Mariel Aloe, MD   10 mg at 11/03/17 0115  . docusate sodium (COLACE) capsule 200 mg  200 mg Oral BID Mariel Aloe, MD   200 mg at 11/09/17 0958  . feeding supplement (ENSURE ENLIVE) (ENSURE ENLIVE) liquid 237 mL  237 mL Oral BID BM Mariel Aloe, MD   237 mL at 11/09/17 1000  . folic acid (FOLVITE) tablet 1 mg  1 mg Oral Daily Mariel Aloe, MD  1 mg at 11/09/17 0958  . guaiFENesin-dextromethorphan (ROBITUSSIN DM) 100-10 MG/5ML syrup 5 mL  5 mL Oral Q4H PRN Mariel Aloe, MD   5 mL at 11/08/17 1654  . heparin injection 5,000 Units  5,000 Units Subcutaneous Q8H Jettie Booze, MD   5,000 Units at 11/09/17 1511  . HYDROcodone-acetaminophen (NORCO/VICODIN) 5-325 MG per tablet 1-2 tablet  1-2 tablet Oral Q4H PRN Mariel Aloe, MD   1 tablet at 11/08/17 2249  . insulin aspart (novoLOG) injection 0-5 Units  0-5 Units Subcutaneous QHS Mariel Aloe, MD   2 Units at 11/10/2017 2256  . insulin aspart (novoLOG) injection 0-9 Units  0-9 Units Subcutaneous TID WC Mariel Aloe, MD   3 Units at 11/09/17 1242  . lacosamide (VIMPAT) tablet 100 mg  100 mg Oral BID Mariel Aloe, MD   100 mg at 11/09/17 0956  . levETIRAcetam (KEPPRA) tablet 1,500 mg  1,500 mg Oral BID Mariel Aloe, MD   1,500 mg at 11/09/17 0957  . multivitamin with minerals tablet 1 tablet  1 tablet Oral Daily Mariel Aloe, MD   1 tablet at 11/09/17 0957  . ondansetron (ZOFRAN) tablet 4 mg  4 mg Oral Q6H PRN Mariel Aloe, MD       Or  . ondansetron (ZOFRAN) injection 4 mg  4  mg Intravenous Q6H PRN Mariel Aloe, MD      . phenol (CHLORASEPTIC) mouth spray 1 spray  1 spray Mouth/Throat PRN Mariel Aloe, MD   1 spray at 10/31/17 0130  . phenytoin (DILANTIN) ER capsule 30 mg  30 mg Oral BID Mariel Aloe, MD   30 mg at 11/09/17 0957  . polyethylene glycol (MIRALAX / GLYCOLAX) packet 17 g  17 g Oral Daily PRN Mariel Aloe, MD      . polyvinyl alcohol (LIQUIFILM TEARS) 1.4 % ophthalmic solution 1 drop  1 drop Both Eyes BID PRN Mariel Aloe, MD      . sodium chloride flush (NS) 0.9 % injection 3 mL  3 mL Intravenous Q12H Jettie Booze, MD   3 mL at 11/09/17 1244  . sodium chloride flush (NS) 0.9 % injection 3 mL  3 mL Intravenous PRN Jettie Booze, MD      . tamsulosin [facility]) capsule 0.4 mg  0.4 mg Oral Daily Mariel Aloe, MD   0.4 mg at 11/09/17 0958  . thiamine (VITAMIN B-1) tablet 100 mg  100 mg Oral Daily Mariel Aloe, MD   100 mg at 11/09/17 1242    Review of Systems  Constitutional: positive for fatigue and weight loss Eyes: negative Ears, nose, mouth, throat, and face: negative Respiratory: positive for cough and dyspnea on exertion Cardiovascular: negative Gastrointestinal: negative Genitourinary:negative Integument/breast: negative Hematologic/lymphatic: negative Musculoskeletal:negative Neurological: positive for weakness Behavioral/Psych: negative Endocrine: negative Allergic/Immunologic: negative  Physical Exam  OEV:OJJKK, healthy, no distress, ill looking and malnourished SKIN: skin color, texture, turgor are normal, no rashes or significant lesions HEAD: Normocephalic, No masses, lesions, tenderness or abnormalities EYES: normal, PERRLA, Conjunctiva are pink and non-injected EARS: Canals clear, TM's Normal OROPHARYNX:no exudate, no erythema and lips, buccal mucosa, and tongue normal  NECK: supple, no adenopathy, no JVD LYMPH:  no palpable lymphadenopathy, no hepatosplenomegaly LUNGS: decreased breath  sounds, expiratory wheezes bilaterally HEART: regular rate & rhythm and no murmurs ABDOMEN:abdomen soft, non-tender, normal bowel sounds and no masses or organomegaly BACK: Back symmetric, no curvature., No CVA tenderness EXTREMITIES:no  joint deformities, effusion, or inflammation, no edema  NEURO: alert & oriented x 3 with fluent speech, no focal motor/sensory deficits  PERFORMANCE STATUS: ECOG 2  LABORATORY DATA: Lab Results  Component Value Date   WBC 11.7 (H) 11/09/2017   HGB 10.2 (L) 11/09/2017   HCT 32.5 (L) 11/09/2017   MCV 80.4 11/09/2017   PLT 587 (H) 11/09/2017    @LASTCHEM @  RADIOGRAPHIC STUDIES: Ct Abdomen Pelvis Wo Contrast  Result Date: 10/20/2017 CLINICAL DATA:  Left shoulder and forehead pain after fall this morning. Lung mass. EXAM: CT CHEST, ABDOMEN, AND PELVIS WITHOUT CONTRAST TECHNIQUE: Multidetector CT imaging of the chest, abdomen and pelvis was performed without IV contrast. CONTRAST:  The patient's IV infiltrated during imaging and the order was changed to CT of the chest, abdomen and pelvis without per ordering clinician. COMPARISON:  PET-CT 05/31/2017 FINDINGS: CT CHEST FINDINGS Cardiovascular: Top-normal size heart with left main and three-vessel coronary arteriosclerosis. Aortic atherosclerosis without aneurysm. The unenhanced pulmonary vasculature is difficult to assess beyond the main pulmonary artery due to soft tissue masses and adenopathy about the right hilum and mediastinum. Mediastinum/Nodes: Interval increase in masslike abnormalities in the mediastinum and right hilum compatible with progression of metastatic adenopathy and/or increase and extension of known right upper lobe mass. Largest lymph node identified is subcarinal at 2.8 cm. Interval increase in right upper paratracheal lymph node measuring 1.1 cm. Exact margins of additional lymph node masses are difficult to delineate on this unenhanced study. There is luminal narrowing of the distal right  mainstem bronchus without occlusion secondary to the right hilar, mediastinal and subcarinal masslike abnormalities. The thyroid gland is unremarkable. The esophagus is not well visualized. Lungs/Pleura: Partially loculated small to moderate right effusion. New pulmonary nodular masslike opacities are noted within the right upper, middle and lower lobes with interval progression in size of spiculated right upper lobe mass now measuring approximately 5.8 x 5.3 x 5 cm. Within the superior segment of the aerated right lower lobe are metastatic nodules measuring up to 2.2 cm. Additional nodules are seen in the right middle and anterior right lower lobe. Left lung remains clear. Nodular thickening along the left major fissure is seen which may represent fluid in the fissure versus an intra fissural mass. This measures 2.2 x 1.2 x 1.7 cm. Peribronchial thickening is noted within both lower lobes. Musculoskeletal: Remote right posterior seventh through ninth rib fractures. No suspicious osseous lesions. Extravasated contrast noted in the soft tissues of the right arm. CT ABDOMEN PELVIS FINDINGS Hepatobiliary: No biliary dilatation. The unenhanced liver is unremarkable. The gallbladder is physiologically distended. Pancreas: No ductal dilatation or inflammation. Spleen: No splenomegaly or mass. Adrenals/Urinary Tract: No adrenal mass. Interpolar 2.5 cm simple right renal cyst. Unremarkable noncontrast appearance of the left kidney. No obstructive uropathy. Marked bladder distention to the umbilicus with the bladder measuring 14.7 x 14.1 x 18.8 cm (volume = 2040 cm^3). Stomach/Bowel: Stomach is within normal limits. Appendix appears normal. No evidence of bowel wall thickening, distention, or inflammatory changes. Vascular/Lymphatic: Moderate aortoiliac and branch vessel atherosclerosis. No aneurysm. No adenopathy. Reproductive: Normal size prostate. Other: No free air nor free fluid. Musculoskeletal: Degenerative disc  disease L5-S1. No aggressive osseous lesions. IMPRESSION: Extravasation of IV contrast into the patient's right arm. Study was flagged for contrast extravasation radiology PA follow-up should the patient be discharged. Chest CT: 1. Interval increase in spiculated right upper lobe mass now measuring approximately 5.8 x 5.3 x 5 cm with progression of metastatic mediastinal and hilar  adenopathy as well as development of satellite pulmonary nodules in the right lung. Slight luminal narrowing of the distal right mainstem bronchus due to adenopathy. No occlusion noted however. 2. Ovoid density in the left major fissure may represent a pseudo lesion with fluid in the fissure versus an intra fissural mass measuring 2.2 x 1.2 x 1.7 cm. 3. Loculated moderate right pleural effusion. Abdomen and pelvic CT: 1. Marked urinary distention with volume of approximately 2 L. 2. Simple right interpolar renal cyst measuring 2.5 cm. 3. No apparent evidence of metastatic disease within the abdomen and pelvis given limitations of a noncontrast exam. Electronically Signed   By: Ashley Royalty M.D.   On: 11/10/2017 22:55   Dg Chest 2 View  Result Date: 10/22/2017 CLINICAL DATA:  Cough, history of lung cancer. EXAM: CHEST - 2 VIEW COMPARISON:  05/18/2017 chest CT, CXR 05/18/2017 FINDINGS: Interval increase in size of masslike opacity in the right upper lobe, best appreciated on the sagittal view measuring approximately 4 x 3.5 x 4.8 cm with right paratracheal and perihilar soft tissue prominence concerning for progression of lymphadenopathy. Superimposed adjacent pneumonia is difficult to entirely exclude. Hyperinflated left lung without pulmonary consolidation or dominant mass. Heart size is top-normal. No aortic aneurysm. No suspicious osseous lesions. IMPRESSION: 1. Interval increase in size of right upper lobe posterior masslike abnormality now estimated at 4 x 3.5 x 4.8 cm, previously estimated at approximately 3.6 cm. 2. Interval  increase in soft tissue prominence about the mediastinum and right hilum compatible with metastatic lymphadenopathy. Superimposed adjacent airspace opacities cannot exclude pneumonia and/or areas of atelectasis. Electronically Signed   By: Ashley Royalty M.D.   On: 11/11/2017 19:37   Dg Elbow Complete Left (3+view)  Result Date: 10/29/2017 CLINICAL DATA:  Elbow fracture. EXAM: LEFT ELBOW - COMPLETE 3+ VIEW; LEFT FOREARM - 2 VIEW COMPARISON:  Left elbow and forearm x-rays dated September 20, 2017. FINDINGS: Left elbow: Unchanged mild cortical step-off along the medial aspect of the capitellum. No new fracture. Persistent elbow joint effusion. Osteopenia. Soft tissues are unremarkable. Left forearm: No radius or ulna fracture. No focal bone lesion. Osteopenia. Soft tissues are unremarkable. IMPRESSION: 1. Unchanged minimally displaced fracture of the capitellum. Persistent elbow joint effusion. 2. No acute abnormality of the forearm. Electronically Signed   By: Titus Dubin M.D.   On: 10/29/2017 13:17   Dg Forearm Left  Result Date: 10/29/2017 CLINICAL DATA:  Elbow fracture. EXAM: LEFT ELBOW - COMPLETE 3+ VIEW; LEFT FOREARM - 2 VIEW COMPARISON:  Left elbow and forearm x-rays dated September 20, 2017. FINDINGS: Left elbow: Unchanged mild cortical step-off along the medial aspect of the capitellum. No new fracture. Persistent elbow joint effusion. Osteopenia. Soft tissues are unremarkable. Left forearm: No radius or ulna fracture. No focal bone lesion. Osteopenia. Soft tissues are unremarkable. IMPRESSION: 1. Unchanged minimally displaced fracture of the capitellum. Persistent elbow joint effusion. 2. No acute abnormality of the forearm. Electronically Signed   By: Titus Dubin M.D.   On: 10/29/2017 13:17   Ct Head Wo Contrast  Result Date: 11/13/2017 CLINICAL DATA:  [place] [address] out of wheelchair today. Left forehead and left shoulder pain. Initial encounter. EXAM: CT HEAD WITHOUT CONTRAST CT CERVICAL SPINE WITHOUT  CONTRAST TECHNIQUE: Multidetector CT imaging of the head and cervical spine was performed following the standard protocol without intravenous contrast. Multiplanar CT image reconstructions of the cervical spine were also generated. COMPARISON:  09/21/2017 FINDINGS: CT HEAD FINDINGS Brain: Right MCA territory encephalomalacia is unchanged as is  more extensive right temporal lobe encephalomalacia which may be in part postsurgical. There is marked cerebral scratched of there is marked cerebellar atrophy. Periventricular white matter hypodensities are unchanged and nonspecific but compatible with mild chronic small vessel ischemic disease. No acute large territory infarct, intracranial hemorrhage, mass, midline shift, or extra-axial fluid collection is identified. Vascular: Calcified atherosclerosis at the skull base. No hyperdense vessel. Skull: Prior right pterional craniotomy.  No acute fracture. Sinuses/Orbits: The visualized paranasal sinuses and mastoid air cells are clear. The orbits are unremarkable. Other: None. CT CERVICAL SPINE FINDINGS Alignment: Chronic reversal of the normal cervical lordosis. Unchanged grade 1 anterolisthesis of C2 on C3 and C3 on C4. Skull base and vertebrae: No acute fracture or suspicious osseous lesion. Soft tissues and spinal canal: No prevertebral fluid or swelling. No visible canal hematoma. Disc levels: Moderate diffuse cervical disc degeneration and moderate to severe left greater than right mid upper cervical facet arthrosis resulting in advanced multilevel neural foraminal stenosis. Upper chest: Right pleural effusion. Unchanged 3 mm left apical lung nodule. Other: Moderate calcified atherosclerosis at the carotid bifurcations. IMPRESSION: 1. No evidence of acute intracranial abnormality. 2. Right cerebral hemisphere encephalomalacia. Severe cerebellar atrophy. 3. No acute cervical spine fracture. Electronically Signed   By: Logan Bores M.D.   On: 11/04/2017 19:51   Ct Chest  Wo Contrast  Result Date: 10/27/2017 CLINICAL DATA:  Left shoulder and forehead pain after fall this morning. Lung mass. EXAM: CT CHEST, ABDOMEN, AND PELVIS WITHOUT CONTRAST TECHNIQUE: Multidetector CT imaging of the chest, abdomen and pelvis was performed without IV contrast. CONTRAST:  The patient's IV infiltrated during imaging and the order was changed to CT of the chest, abdomen and pelvis without per ordering clinician. COMPARISON:  PET-CT 05/31/2017 FINDINGS: CT CHEST FINDINGS Cardiovascular: Top-normal size heart with left main and three-vessel coronary arteriosclerosis. Aortic atherosclerosis without aneurysm. The unenhanced pulmonary vasculature is difficult to assess beyond the main pulmonary artery due to soft tissue masses and adenopathy about the right hilum and mediastinum. Mediastinum/Nodes: Interval increase in masslike abnormalities in the mediastinum and right hilum compatible with progression of metastatic adenopathy and/or increase and extension of known right upper lobe mass. Largest lymph node identified is subcarinal at 2.8 cm. Interval increase in right upper paratracheal lymph node measuring 1.1 cm. Exact margins of additional lymph node masses are difficult to delineate on this unenhanced study. There is luminal narrowing of the distal right mainstem bronchus without occlusion secondary to the right hilar, mediastinal and subcarinal masslike abnormalities. The thyroid gland is unremarkable. The esophagus is not well visualized. Lungs/Pleura: Partially loculated small to moderate right effusion. New pulmonary nodular masslike opacities are noted within the right upper, middle and lower lobes with interval progression in size of spiculated right upper lobe mass now measuring approximately 5.8 x 5.3 x 5 cm. Within the superior segment of the aerated right lower lobe are metastatic nodules measuring up to 2.2 cm. Additional nodules are seen in the right middle and anterior right lower lobe.  Left lung remains clear. Nodular thickening along the left major fissure is seen which may represent fluid in the fissure versus an intra fissural mass. This measures 2.2 x 1.2 x 1.7 cm. Peribronchial thickening is noted within both lower lobes. Musculoskeletal: Remote right posterior seventh through ninth rib fractures. No suspicious osseous lesions. Extravasated contrast noted in the soft tissues of the right arm. CT ABDOMEN PELVIS FINDINGS Hepatobiliary: No biliary dilatation. The unenhanced liver is unremarkable. The gallbladder is physiologically distended.  Pancreas: No ductal dilatation or inflammation. Spleen: No splenomegaly or mass. Adrenals/Urinary Tract: No adrenal mass. Interpolar 2.5 cm simple right renal cyst. Unremarkable noncontrast appearance of the left kidney. No obstructive uropathy. Marked bladder distention to the umbilicus with the bladder measuring 14.7 x 14.1 x 18.8 cm (volume = 2040 cm^3). Stomach/Bowel: Stomach is within normal limits. Appendix appears normal. No evidence of bowel wall thickening, distention, or inflammatory changes. Vascular/Lymphatic: Moderate aortoiliac and branch vessel atherosclerosis. No aneurysm. No adenopathy. Reproductive: Normal size prostate. Other: No free air nor free fluid. Musculoskeletal: Degenerative disc disease L5-S1. No aggressive osseous lesions. IMPRESSION: Extravasation of IV contrast into the patient's right arm. Study was flagged for contrast extravasation radiology PA follow-up should the patient be discharged. Chest CT: 1. Interval increase in spiculated right upper lobe mass now measuring approximately 5.8 x 5.3 x 5 cm with progression of metastatic mediastinal and hilar adenopathy as well as development of satellite pulmonary nodules in the right lung. Slight luminal narrowing of the distal right mainstem bronchus due to adenopathy. No occlusion noted however. 2. Ovoid density in the left major fissure may represent a pseudo lesion with fluid  in the fissure versus an intra fissural mass measuring 2.2 x 1.2 x 1.7 cm. 3. Loculated moderate right pleural effusion. Abdomen and pelvic CT: 1. Marked urinary distention with volume of approximately 2 L. 2. Simple right interpolar renal cyst measuring 2.5 cm. 3. No apparent evidence of metastatic disease within the abdomen and pelvis given limitations of a noncontrast exam. Electronically Signed   By: Ashley Royalty M.D.   On: 11/04/2017 22:55   Ct Cervical Spine Wo Contrast  Result Date: 11/14/2017 CLINICAL DATA:  [address] out of wheelchair today. Left forehead and left shoulder pain. Initial encounter. EXAM: CT HEAD WITHOUT CONTRAST CT CERVICAL SPINE WITHOUT CONTRAST TECHNIQUE: Multidetector CT imaging of the head and cervical spine was performed following the standard protocol without intravenous contrast. Multiplanar CT image reconstructions of the cervical spine were also generated. COMPARISON:  09/21/2017 FINDINGS: CT HEAD FINDINGS Brain: Right MCA territory encephalomalacia is unchanged as is more extensive right temporal lobe encephalomalacia which may be in part postsurgical. There is marked cerebral scratched of there is marked cerebellar atrophy. Periventricular white matter hypodensities are unchanged and nonspecific but compatible with mild chronic small vessel ischemic disease. No acute large territory infarct, intracranial hemorrhage, mass, midline shift, or extra-axial fluid collection is identified. Vascular: Calcified atherosclerosis at the skull base. No hyperdense vessel. Skull: Prior right pterional craniotomy.  No acute fracture. Sinuses/Orbits: The visualized paranasal sinuses and mastoid air cells are clear. The orbits are unremarkable. Other: None. CT CERVICAL SPINE FINDINGS Alignment: Chronic reversal of the normal cervical lordosis. Unchanged grade 1 anterolisthesis of C2 on C3 and C3 on C4. Skull base and vertebrae: No acute fracture or suspicious osseous lesion. Soft tissues and spinal  canal: No prevertebral fluid or swelling. No visible canal hematoma. Disc levels: Moderate diffuse cervical disc degeneration and moderate to severe left greater than right mid upper cervical facet arthrosis resulting in advanced multilevel neural foraminal stenosis. Upper chest: Right pleural effusion. Unchanged 3 mm left apical lung nodule. Other: Moderate calcified atherosclerosis at the carotid bifurcations. IMPRESSION: 1. No evidence of acute intracranial abnormality. 2. Right cerebral hemisphere encephalomalacia. Severe cerebellar atrophy. 3. No acute cervical spine fracture. Electronically Signed   By: Logan Bores M.D.   On: 10/28/2017 19:51   Dg Chest Right Decubitus  Result Date: 11/08/2017 CLINICAL DATA:  Pleural effusion. EXAM: CHEST -  RIGHT DECUBITUS COMPARISON:  11/07/2017 FINDINGS: Decubital radiograph, right side down demonstrates layering left pleural effusion. There is a known right pleural effusion. Poor aeration of the right lung likely due to compression, large pulmonary masses and loculated right pleural effusion. IMPRESSION: Layering left pleural effusion. Loculated right pleural effusion.  Poor aeration of the right lung. Electronically Signed   By: Fidela Salisbury M.D.   On: 11/08/2017 21:36   Dg Chest Port 1 View  Result Date: 11/07/2017 CLINICAL DATA:  Pleural effusion. EXAM: PORTABLE CHEST 1 VIEW COMPARISON:  Multiple prior studies including CT 11/14/2017 FINDINGS: The heart margins are obscured by significant opacity in the RIGHT hemithorax. A RIGHT-sided small bore pleural drain is in place, overlying the heart and RIGHT lung base. There is increased opacity in the RIGHT hemithorax, consistent with pleural effusion and parenchymal opacity. There is volume loss in the RIGHT lung. LEFT basilar opacity partially obscures the hemidiaphragm. Multiple LEFT-sided skin folds. No pneumothorax. There are distended loops of bowel in the LEFT UPPER QUADRANT the abdomen. Remote bilateral  rib fractures. IMPRESSION: 1. Increased opacity in the RIGHT lung. 2. Stable LEFT basilar opacity. Electronically Signed   By: Nolon Nations M.D.   On: 11/07/2017 10:49   Dg Chest Port 1 View  Result Date: 11/08/2017 CLINICAL DATA:  67 year old male with a history of cough and shortness of breath EXAM: PORTABLE CHEST 1 VIEW COMPARISON:  Multiple prior chest x-rays most recent 11/01/2017, 10/29/2017, 11/11/2017, prior chest CT 10/19/2017 FINDINGS: Cardiomediastinal silhouette unchanged in size and contour, with heart borders partially obscured by lung/pleural disease. Significant right rotation of the patient somewhat limits evaluation, however, there is increasing opacity at the right lung with mixed interstitial, interlobular septal opacities, and airspace opacity with evidence of pleural effusion. Increasing opacity along the upper right mediastinal border/paratracheal border. No pneumothorax. Left lung remains relatively well aerated. IMPRESSION: Increasing opacity of the right lung, likely a combination of airspace opacity/consolidation, lung volume loss, pleural effusion, and tumor in this patient with prior cross-sectional imaging demonstrating lung mass. Given the increasing airspace opacity, differential includes endobronchial tumor/debris leading to partial volume loss. Correlation with bronchoscopy may be useful. Electronically Signed   By: Corrie Mckusick D.O.   On: 10/31/2017 13:37   Dg Chest Port 1 View  Result Date: 11/01/2017 CLINICAL DATA:  Shortness of breath EXAM: PORTABLE CHEST 1 VIEW COMPARISON:  10/29/2017 FINDINGS: Cardiac shadow is stable. The left lung remains clear. Old rib fractures on the left are noted. Large skin fold is noted over the left mid lung. Persistent increased density is noted in the right upper lobe consistent with the known underlying mass lesion. Patchy infiltrative changes are noted in the right middle lobe as well. Old rib fractures on the right are seen.  IMPRESSION: Stable right upper lobe mass lesion with associated patchy infiltrates similar to that noted on prior CT examination. No large pleural effusion is seen. Electronically Signed   By: Inez Catalina M.D.   On: 11/01/2017 09:53   Dg Chest Port 1 View  Result Date: 10/29/2017 CLINICAL DATA:  Status post thoracentesis. EXAM: PORTABLE CHEST 1 VIEW COMPARISON:  Chest radiograph 10/22/2017 FINDINGS: Negative for pneumothorax following thoracentesis. There may be a small amount of right pleural fluid present. Extensive airspace densities throughout the right lung particularly in the perihilar regions. The degree of right lung consolidation may have slightly increased. Left lung remains clear. Heart size is grossly stable. Old right rib fractures. IMPRESSION: Negative for pneumothorax following thoracentesis. Extensive  airspace disease and consolidation in the right lung and cannot exclude progression since prior examination. Electronically Signed   By: Markus Daft M.D.   On: 10/29/2017 10:23   Dg Shoulder Left  Result Date: 10/31/2017 CLINICAL DATA:  [place] striking the left side today. Recent distal humeral fracture. EXAM: LEFT SHOULDER - 2+ VIEW COMPARISON:  Left humerus series of Nov 17, 2009 FINDINGS: There is chronic deformity of the distal clavicle. No acute clavicular fracture is observed. The scapula and proximal humerus appear normal. There is narrowing of the glenohumeral joint space. IMPRESSION: There is no acute fracture or dislocation of the left shoulder. 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 There is chronic deformity of the distal clavicle. Electronically Signed   By: David  Martinique M.D.   On: 11/13/2017 11:20   Dg Foot Complete Left  Result Date: 11/01/2017 CLINICAL DATA:  Left heel pain. EXAM: LEFT FOOT - COMPLETE 3+ VIEW COMPARISON:  09/21/2017. FINDINGS: Osteopenia. Mild degenerative changes at the first metatarsophalangeal joint with hallux valgus. Single screw  traverses the posterior calcaneus with adjacent bony overgrowth. No acute osseous abnormality. IMPRESSION: 1. Postoperative changes in the calcaneus without acute osseous abnormality. 2. Mild first metatarsophalangeal joint osteoarthritis. Electronically Signed   By: Lorin Picket M.D.   On: 11/01/2017 09:57   Dg Swallowing Func-speech Pathology  Result Date: 11/03/2017 Objective Swallowing Evaluation: Type of Study: MBS-Modified Barium Swallow Study  Patient Details Name: LESEAN WOOLVERTON MRN: [id number] Date of Birth: June 22, 1951 Today's Date: 11/03/2017 Time: SLP Start Time (ACUTE ONLY): 0910 -SLP Stop Time (ACUTE ONLY): 0930 SLP Time Calculation (min) (ACUTE ONLY): 20 min Past Medical History: Past Medical History: Diagnosis Date . Diabetes mellitus without complication ([place])   Type II . Diastolic dysfunction   a. ECHO 07/2013 EF 55-60% and grade 1 diastolic dysfunction . NSTEMI (non-ST elevated myocardial infarction) ([place])   problem list 01/2014 . Rhabdomyolysis 01/2014 . Seizures ([place])  . Syncope 01/2014 . Traumatic brain injury [facility])  Past Surgical History: Past Surgical History: Procedure Laterality Date . CRANIOTOMY   . VIDEO BRONCHOSCOPY Bilateral 10/23/2017  Procedure: VIDEO BRONCHOSCOPY WITH FLUORO;  Surgeon: Collene Gobble, MD;  Location: [facility];  Service: Cardiopulmonary;  Laterality: Bilateral; HPI: 67 yo male adm to [facility] with AMS - Pt found to have right lung mass, TBI, SDH, ETOH use, seizures, essentially wheelchair bound - s/p falls with left humerus fx and ankle injury.  Pt is s/p thoracentesis 10/29/17 and bronchoscopy 11/14/2017 with findings abnormal cells.  Swallow eval ordered.   Imaging studies showed right cerebral encephalomalacia and severe cerebellar atrophy.   Pt underwent BSE and was found to be high aspiration risk, MBS ordered.   Subjective: pt awake in chair Assessment / Plan / Recommendation CHL IP CLINICAL IMPRESSIONS 11/03/2017 Clinical Impression Pt presents with mild oral  dysphagia and intact pharyngeal swallow ability.  NO aspiration or penetration noted during MBS.  Pt demonstrates extended breath hold independently with swallowing liquids.  Difficulty masticating and transiting solid was noted - likely due to dry bolus, xerostomia and poor dentition.  He did not cough during entire MBS however and thus will follow up briefly to assure tolerance.  SLP Visit Diagnosis Dysphagia, oral phase (R13.11) Attention and concentration deficit following -- Frontal lobe and executive function deficit following -- Impact on safety and function Mild aspiration risk   CHL IP TREATMENT RECOMMENDATION 11/03/2017 Treatment Recommendations Therapy as outlined in treatment plan below   Prognosis 11/03/2017 Prognosis for Safe Diet  Advancement Good Barriers to Reach Goals -- Barriers/Prognosis Comment -- CHL IP DIET RECOMMENDATION 11/03/2017 SLP Diet Recommendations Dysphagia 3 (Mech soft) solids;Thin liquid Liquid Administration via Straw Medication Administration Other (Comment) Compensations Minimize environmental distractions;Slow rate;Small sips/bites Postural Changes Remain semi-upright after after feeds/meals (Comment);Seated upright at 90 degrees   CHL IP OTHER RECOMMENDATIONS 11/03/2017 Recommended Consults -- Oral Care Recommendations Oral care QID Other Recommendations --   CHL IP FOLLOW UP RECOMMENDATIONS 11/15/2017 Follow up Recommendations Other (comment)   CHL IP FREQUENCY AND DURATION 11/03/2017 Speech Therapy Frequency (ACUTE ONLY) min 1 x/week Treatment Duration 1 week      CHL IP ORAL PHASE 11/03/2017 Oral Phase Impaired Oral - Pudding Teaspoon -- Oral - Pudding Cup -- Oral - Honey Teaspoon -- Oral - Honey Cup -- Oral - Nectar Teaspoon WFL Oral - Nectar Cup -- Oral - Nectar Straw -- Oral - Thin Teaspoon WFL Oral - Thin Cup -- Oral - Thin Straw WFL;Piecemeal swallowing Oral - Puree WFL;Piecemeal swallowing Oral - Mech Soft -- Oral - Regular Impaired mastication;Weak lingual  manipulation;Piecemeal swallowing;Delayed oral transit Oral - Multi-Consistency -- Oral - Pill WFL Oral Phase - Comment --  CHL IP PHARYNGEAL PHASE 11/03/2017 Pharyngeal Phase WFL Pharyngeal- Pudding Teaspoon -- Pharyngeal -- Pharyngeal- Pudding Cup -- Pharyngeal -- Pharyngeal- Honey Teaspoon -- Pharyngeal -- Pharyngeal- Honey Cup -- Pharyngeal -- Pharyngeal- Nectar Teaspoon -- Pharyngeal -- Pharyngeal- Nectar Cup -- Pharyngeal -- Pharyngeal- Nectar Straw -- Pharyngeal -- Pharyngeal- Thin Teaspoon -- Pharyngeal -- Pharyngeal- Thin Cup -- Pharyngeal -- Pharyngeal- Thin Straw -- Pharyngeal -- Pharyngeal- Puree -- Pharyngeal -- Pharyngeal- Mechanical Soft -- Pharyngeal -- Pharyngeal- Regular -- Pharyngeal -- Pharyngeal- Multi-consistency -- Pharyngeal -- Pharyngeal- Pill -- Pharyngeal -- Pharyngeal Comment --  CHL IP CERVICAL ESOPHAGEAL PHASE 11/03/2017 Cervical Esophageal Phase WFL Pudding Teaspoon -- Pudding Cup -- Honey Teaspoon -- Honey Cup -- Nectar Teaspoon -- Nectar Cup -- Nectar Straw -- Thin Teaspoon -- Thin Cup -- Thin Straw -- Puree -- Mechanical Soft -- Regular -- Multi-consistency -- Pill -- Cervical Esophageal Comment -- No flowsheet data found. 11/03/2017, 9:49 AM Luanna Salk, MS [facility] SLP [phone]               ASSESSMENT: This is a very pleasant 67 years old African-American male recently diagnosed with stage IV (T3, N2, M1a) non-small cell lung cancer, squamous cell carcinoma presented with large right upper lobe lung mass in addition to mediastinal lymphadenopathy and malignant right pleural effusion.  This was diagnosed in April 2019.   PLAN: I had a lengthy discussion with the patient today about his current disease of stage, prognosis and treatment options.  I explained to the patient that he has incurable condition all the treatment will be of palliative nature. I gave the patient the option of palliative care and hospice referral versus consideration of palliative systemic treatment with  chemotherapy plus/minus immunotherapy with Keytruda. The patient is interested in some form of treatment but he would like to receive his treatment at a [place] facility in [place] close to his family. The patient does not have any family member in [place] and logistic for his treatment will be very challenging. I think this is very reasonable for the patient to be close to his family during his treatment.  He understands that his prognosis is very poor. I will be happy to arrange for the patient a follow-up appointment at the cancer center if he decided to receive his treatment locally in [place]. I provided the  patient with my contact information and he will call for follow-up visit after discharge if he decided to stay in [facility] for treatment. He was advised to call if he has any other concerning symptoms.  The patient voices understanding of current disease status and treatment options and is in agreement with the current care plan.  All questions were answered. The patient knows to call the clinic with any problems, questions or concerns. We can certainly see the patient much sooner if necessary.  Thank you so much for allowing me to participate in the care of Wyatt Salas. I will continue to follow up the patient with you and assist in his care.  Disclaimer: This note was dictated with voice recognition software. Similar sounding words can inadvertently be transcribed and may not be corrected upon review.   Eilleen Kempf November 09, 2017, 3:54 PM

## 2017-11-09 NOTE — Plan of Care (Signed)
Care plan reviewed and patient is progressing.

## 2017-11-09 NOTE — Progress Notes (Signed)
Daily Progress Note   Patient Name: Wyatt Salas       Date: 11/09/2017 DOB: 1950/12/14  Age: 67 y.o. MRN#: [id number] Attending Physician: Mariel Aloe, MD Primary Care Physician: [name], MD Admit Date: 10/22/2017  Reason for Consultation/Follow-up: Establishing goals of care and Psychosocial/spiritual support  Subjective: Sitting in bed. Has just finished lunch with assistance of staff. Denies dyspnea or pain. Reports "too blessed to be stressed" but also reports being discouraged that he has been told cancer is "stage IV. That's not good."     Length of Stay: 13  Current Medications: Scheduled Meds:  . aspirin  81 mg Oral Daily  . atorvastatin  40 mg Oral q1800  . benzonatate  200 mg Oral BID  . docusate sodium  200 mg Oral BID  . feeding supplement (ENSURE ENLIVE)  237 mL Oral BID BM  . folic acid  1 mg Oral Daily  . heparin  5,000 Units Subcutaneous Q8H  . insulin aspart  0-5 Units Subcutaneous QHS  . insulin aspart  0-9 Units Subcutaneous TID WC  . lacosamide  100 mg Oral BID  . levETIRAcetam  1,500 mg Oral BID  . multivitamin with minerals  1 tablet Oral Daily  . phenytoin  30 mg Oral BID  . sodium chloride flush  3 mL Intravenous Q12H  . tamsulosin  0.4 mg Oral Daily  . thiamine  100 mg Oral Daily    Continuous Infusions: . sodium chloride    . sodium chloride 10 mL/hr at 11/08/17 1200  . amiodarone 30 mg/hr (11/08/17 1654)    PRN Meds: sodium chloride, acetaminophen, diltiazem, guaiFENesin-dextromethorphan, HYDROcodone-acetaminophen, ondansetron **OR** ondansetron (ZOFRAN) IV, phenol, polyethylene glycol, polyvinyl alcohol, sodium chloride flush  Physical Exam  Constitutional:  Frail and cachectic  HENT:  Temporal wasting  Neck: Normal range  of motion.  Cardiovascular: Normal rate.  Pulmonary/Chest:  Becomes short of breath after speaking, dry persistent cough  Musculoskeletal: Normal range of motion.  Neurological: He is alert.  Skin: Skin is warm and dry.  Psychiatric: He has a normal mood and affect. His behavior is normal. Thought content normal.  Nursing note and vitals reviewed.  Vital Signs: BP 97/61 (BP Location: Right Arm)   Pulse (!) 117   Temp 98.4 F (36.9 C) (Oral)   Resp Marland Kitchen)  21   Ht 6\' 1"  (1.854 m)   Wt 64 kg (141 lb 1.5 oz)   SpO2 92%   BMI 18.62 kg/m  SpO2: SpO2: 92 % O2 Device: O2 Device: Room Air O2 Flow Rate: O2 Flow Rate (L/min): 2 L/min  Intake/output summary:   Intake/Output Summary (Last 24 hours) at 11/09/2017 1416 Last data filed at 11/09/2017 1300 Gross per 24 hour  Intake 1030.1 ml  Output 770 ml  Net 260.1 ml   LBM: Last BM Date: 11/07/17 Baseline Weight: Weight: 68 kg (150 lb) Most recent weight: Weight: 64 kg (141 lb 1.5 oz)       Palliative Assessment/Data:    Flowsheet Rows     Most Recent Value  Intake Tab  Referral Department  Hospitalist  Unit at Time of Referral  Med/Surg Unit  Palliative Care Primary Diagnosis  Cancer  Date Notified  10/28/17  Palliative Care Type  New Palliative care  Reason for referral  Clarify Goals of Care  Date of Admission  11/06/2017  Date first seen by Palliative Care  10/29/17  # of days Palliative referral response time  1 Day(s)  # of days IP prior to Palliative referral  2  Clinical Assessment  Palliative Performance Scale Score  40%  Pain Max last 24 hours  6  Pain Min Last 24 hours  0  Psychosocial & Spiritual Assessment  Palliative Care Outcomes  Patient/Family meeting held?  Yes  Who was at the meeting?  patient  Palliative Care Outcomes  Clarified goals of care      Patient Active Problem List   Diagnosis Date Noted  . 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 Atrial flutter ([place])   . Goals of care,  counseling/discussion   . Palliative care by specialist   . Cardiac/pericardial tamponade   . Acute combined systolic and diastolic heart failure ([place])   . Protein-calorie malnutrition, severe 10/28/2017  . Acute urinary retention 10/27/2017  . Atrial fibrillation with rapid ventricular response ([place]) 10/27/2017  . Chronic diastolic CHF (congestive heart failure) ([place]) 10/27/2017  . Closed fracture of distal end of left humerus 10/27/2017  . Mass of upper lobe of right lung   . New onset atrial fibrillation ([place])   . Abnormal liver function   . Lung mass   . Essential hypertension   . Altered mental status 05/18/2017  . Right sided weakness 10/14/2016  . Acute urinary tract infection 10/14/2016  . Elevated troponin 10/14/2016  . Type 2 diabetes mellitus with hyperlipidemia ([place]) 10/14/2016  . Fall at home--mechanical 01/26/2014  . Elevated CK 01/26/2014  . Seizure disorder ([place]) 07/26/2013  . NSTEMI (non-ST elevated myocardial infarction) ([place]) 07/26/2013  . Hyperlipidemia 07/26/2013  . Aspiration pneumonia ([place]) 07/26/2013  . 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 fib with RVR.  Await results of cytology for decision regarding possible need for bronchoscopy.  Wyatt Salas has undergone bronchoscopy and unfortunately was found to have non-small cell lung cancer.  On 10/25/2017, he underwent pericardiocentesis at [facility] that shows malignant cells.  Consult ordered for goals of care   Assessment: Wyatt Salas continues to verbalize that he wants to get better and be cured from his cancer  and redirects attempts at goals  conversation or code status discussions. Declines to have me call any family and reports that he needs to be able to meet with oncologist before he will consider any further conversations.   Recommendations/Plan:  Palliative medicine will be glad to continue to be a source of support as well as engage patient if he becomes more open to goals of care conversations.  Reports that he is not interested in discussion about goals until he has had the opportunity to meet with oncologist.  Will need further goals of care discussions after his discharge if he is not going to see oncology while inpatient.  He is going to be affiliated with the oncology center through [facility].  The oncology department has an embedded palliative medicine provider that sees patients on Mondays.  Another option would be the palliative medicine division of [facility] which can be reached at [phone]; or [facility], a division of [facility], [phone]  Wyatt Salas would qualify for his hospice benefit.  At this point however he is not open to discussing issues around end-of-life, code status. Wyatt Salas is also affiliated with the [place], who could be another source of support as this goes forward.  Desires to discuss with oncology prior to further conversations.  We did discuss that this is not going to be a condition that is going to go away.  He became withdrawn at that point.  Remains full code for now.   Goals of Care and Additional Recommendations:  Limitations on Scope of Treatment: Full Scope Treatment  Code Status:    Code Status Orders  (From admission, onward)        Start     Ordered   10/27/17 0548  Full code  Continuous     10/27/17 0549    Code Status History    Date Active Date Inactive Code Status Order ID Comments User Context   05/18/2017 2330 05/24/2017 2201 Full Code [id number]  Jani Gravel, MD Inpatient   10/14/2016 2118 10/15/2016 1921 Full  Code [id number]  Toy Baker, MD Inpatient   01/25/2014 2010 01/26/2014 2035 Full Code [id number]  Corky Sox, MD Inpatient   07/27/2013 0019 07/31/2013 1533 Full Code [id number]  Orvan Falconer, MD Inpatient       Prognosis:   < 6 months in the setting of newly diagnosed non-small cell lung cancer; protein calorie malnutrition with an albumin of 2.4, BMI 16.46  Discharge Planning:  [facility] for rehab with Palliative care service follow-up Depending on hospital course, this projected disposition might change.   Care plan was discussed with patient.   Thank you for allowing the Palliative Medicine Team to assist in the care of this patient.   Time In: 1300 Time Out: 1340 Total Time 40 min Prolonged Time Billed  no       Greater than 50%  of this time was spent counseling and coordinating care related to the above assessment and plan.  Micheline Rough, MD [facility] Team [phone]  Please contact Palliative Medicine Team phone at [phone] for questions and concerns.

## 2017-11-09 NOTE — Progress Notes (Addendum)
PROGRESS NOTE    Wyatt Salas  DDU:[id number] DOB: November 06, 1950 DOA: 11/03/2017 PCP: [name], MD   Brief Narrative: Wyatt Salas is a 67 y.o. male with a history of alcohol use, seizure disorder, DM-2, hypertension, known lung mass. He presented secondary to a fall, found to be in atrial fibrillation with RVR. 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 Biopsy pending. atrial fib/flutter difficult to control. Cardiology on board.  S/p pericardiocentesis on 4/19 significant for malignancy. Hyponatremia likely secondary to SIADH, fluid restricted. Recurrent pleural effusion, loculated, plan for thoracentesis/drain. Plan for SNF on discharge.    Assessment & Plan:   Principal Problem:   Atrial fibrillation with rapid ventricular response (HCC) Active Problems:   Seizure disorder (HCC)   Type 2 diabetes mellitus with hyperlipidemia (HCC)   Lung mass   Essential hypertension   Acute urinary retention   Chronic diastolic CHF (congestive heart failure) (HCC)   Closed fracture of distal end of left humerus   Protein-calorie malnutrition, severe   Acute combined systolic and diastolic heart failure (HCC)   Cardiac/pericardial tamponade   Goals of care, counseling/discussion   Palliative care by specialist   Atrial flutter ([place])   Atrial flutter w/ RVR Persistent RVR. CHA2DS2-VASc Score is 4. Switched to amiodarone drip. Cardizem, digoxin, metoprolol discontinued. -Cardiology recommendations: Amiodarone drip  Non-small cell carcinoma Patient is s/p bronchoscopy with bronchial brushing significant for non-small cell carcinoma. Biopsy confirms carcinoma. Pleural effusion was exudative with atypical cells. -Oncology recommendations (plan to see today, 4/23)  Malignant pericardial effusion Cardiac tamponade 3cm on Transthoracic Echocardiogram. Patient tachycardic in setting of afib/flutter with hypotension in  setting of diltiazem drip/metoprolol. ?malignant. S/p pericardiocentesis on 4/19. Drain with 20 ml out in last 24 hours. Called pathology with regard to cytology. 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 Blood pressure readings previously from right forearm -Per cardiology as mentioned above  Dysphagia -Dysphagia 3 diet per speech therapy recommendations  Left humerus fracture Secondary to fall. Outpatient follow-up with Dr. [name].  History of alcohol use Out of window for DTs.  Deconditioning Frequent falls -PT eval: SNF  Left ankle pain Likely arthritic. Symptomatic management.  Left heel pain S/p olecranon fracture in 2012 s/p repair with screw. -Symptom management. Did not tolerate boots  Acute urinary retention Continue Flomax. Foley placed on 4/13. Plan for outpatient urology follow-up.  Diabetes mellitus, type 2 Poorly controlled this morning. Last hemoglobin A1C of 6% in 2018. -Continue SSI -Hemoglobin A1C -Will start Lantus pending results of HA1c  Seizure disorder -Continue Keppra and Dilantin  Chronic combined systolic and diastolic heart failure EF of 40-45% with grade 2 diastolic dysfunction. Diffuse hypokinesis. Weight is up 3 lbs from yesterday, but has been stable. -Daily weights -Strict in/out  Right chest pain Cough present, however, cough is not new. Pain is pleuritic. Chest x-ray significant for increased airspace opacity on 4/19. Still with cough and mild production. Afebrile. No leukocytosis. Pulmonology consulted for repeat thoracentesis  Hyponatremia Patient is euvolemic on exam. UOP of 420 mL. No documented UOP over the last 12 hours. 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 Stable. Asymptomatic. -Fluid restricted diet -Repeat BMP   DVT prophylaxis: Lovenox Code Status:   Code Status: Full Code Family Communication: None at  bedside Disposition Plan: Discharge to SNF when atrial flutter/rvr improved   Consultants:   Pulmonology  Cardiology  Palliative  care medicine  Oncology (telepone)  Procedures:   Bronchoscopy (10/20/2017)  Transthoracic Echocardiogram (11/03/17) Study Conclusions - Left ventricle: The cavity size was normal. Wall thickness was normal. Systolic function was mildly to moderately reduced. The estimated ejection fraction was in the range of 40% to 45%. Diffuse hypokinesis. Features are consistent with a pseudonormal left ventricular filling pattern, with concomitant abnormal relaxation and increased filling pressure (grade 2 diastolic dysfunction). - Mitral valve: There was mild regurgitation. - Tricuspid valve: There was trivial regurgitation. - Pericardium, extracardiac: A large pericardial effusion (up to 3cm) was identified circumferential to the heart. The fluid had no internal echoes. No RA or RV collapse. IVC is small in diameter but does not demonstrate respiratory variation. There was no significant evidence of hemodynamic compromise.  Impressions: - Pericardial effusion is new when compared to prior.  Pericardiocentesis (4/19)   Antimicrobials:  None    Subjective: Heel pain and arm pain.  Objective: Vitals:   11/08/17 2356 11/09/17 0454 11/09/17 0600 11/09/17 0749  BP: 104/86  93/69 101/68  Pulse: 93  (!) 127 (!) 123  Resp: (!) 25  (!) 21   Temp: 98.5 F (36.9 C)  98.6 F (37 C) 98.5 F (36.9 C)  TempSrc: Oral  Oral Oral  SpO2: 95%  92%   Weight:  64 kg (141 lb 1.5 oz)    Height:        Intake/Output Summary (Last 24 hours) at 11/09/2017 0826 Last data filed at 11/08/2017 1859 Gross per 24 hour  Intake 600.3 ml  Output 420 ml  Net 180.3 ml   Filed Weights   11/07/17 0632 11/08/17 0600 11/09/17 0454  Weight: 57.9 kg (127 lb 10.3 oz) 60.1 kg (132 lb 7.9 oz) 64 kg (141 lb 1.5 oz)    Examination:  General exam: Appears  calm and comfortable. Frail.  Respiratory system: Clear to auscultation on left with diminished sounds on right with anterior auscultation. Respiratory effort normal. Cardiovascular system: S1 & S2 heard, Fast heart rate with regular rhythm. No murmurs, rubs, gallops or clicks. Gastrointestinal system: Abdomen is nondistended, soft and nontender. Normal bowel sounds heard. Central nervous system: Alert and oriented. Left arm 2/5 strength Extremities: No edema. No calf tenderness Skin: No cyanosis. No rashes Psychiatry: Judgement and insight appear normal. Flat affect, depressed mood.    Data Reviewed: I have personally reviewed following labs and imaging studies  CBC: Recent Labs  Lab 11/03/17 0425 11/07/2017 1852 11/06/17 0301 11/07/17 0848 11/09/17 0404  WBC 11.3* 9.4 7.6 10.3 11.7*  HGB 11.1* 10.8* 10.6* 11.0* 10.2*  HCT 36.1* 33.2* 33.0* 35.5* 32.5*  MCV 81.5 80.2 80.5 81.2 80.4  PLT 462* 441* 471* 497* 481*   Basic Metabolic Panel: Recent Labs  Lab 11/03/17 0425 11/04/17 1301 11/15/2017 1852 11/06/17 0301 11/07/17 0848 11/08/17 0219 11/09/17 0404  NA 133* 131*  --  131* 129* 128* 129*  K 4.8 4.2  --  4.2 4.5 4.0 4.1  CL 99* 99*  --  100* 98* 98* 97*  CO2 22 22  --  21* 23 23 24   GLUCOSE 163* 144*  --  161* 218* 175* 202*  BUN 9 11  --  6 9 6 8   CREATININE 0.57* 0.49* 0.55* 0.51* 0.58* 0.52* 0.52*  CALCIUM 9.4 9.0  --  8.8* 9.1 8.8* 8.8*  MG 1.8 1.9  --   --   --   --   --    GFR: Estimated Creatinine Clearance: 80 mL/min (A) (by  C-G formula based on SCr of 0.52 mg/dL (L)). Liver Function Tests: Recent Labs  Lab 11/03/17 0425  AST 57*  ALT 53  ALKPHOS 88  BILITOT 0.3  PROT 7.0  ALBUMIN 2.4*   No results for input(s): LIPASE, AMYLASE in the last 168 hours. No results for input(s): AMMONIA in the last 168 hours. Coagulation Profile: No results for input(s): INR, PROTIME in the last 168 hours. Cardiac Enzymes: No results for input(s): CKTOTAL, CKMB,  CKMBINDEX, TROPONINI in the last 168 hours. BNP (last 3 results) No results for input(s): PROBNP in the last 8760 hours. HbA1C: No results for input(s): HGBA1C in the last 72 hours. CBG: Recent Labs  Lab 11/08/17 1552 11/08/17 1759 11/08/17 2213 11/09/17 0555 11/09/17 0755  GLUCAP 148* 173* 153* 189* 245*   Lipid Profile: No results for input(s): CHOL, HDL, LDLCALC, TRIG, CHOLHDL, LDLDIRECT in the last 72 hours. Thyroid Function Tests: No results for input(s): TSH, T4TOTAL, FREET4, T3FREE, THYROIDAB in the last 72 hours. Anemia Panel: No results for input(s): VITAMINB12, FOLATE, FERRITIN, TIBC, IRON, RETICCTPCT in the last 72 hours. Sepsis Labs: No results for input(s): PROCALCITON, LATICACIDVEN in the last 168 hours.  Recent Results (from the past 240 hour(s))  Body fluid culture     Status: None (Preliminary result)   Collection Time: 11/07/2017  4:33 PM  Result Value Ref Range Status   Specimen Description PERICARDIAL [facility]  Final   Special Requests NONE  Final   Gram Stain   Final    FEW WBC PRESENT, PREDOMINANTLY PMN NO ORGANISMS SEEN    Culture   Final    NO GROWTH 3 DAYS Performed at [facility], [address] [place], [place] [address]    Report Status PENDING  Incomplete         Radiology Studies: Dg Chest Right Decubitus  Result Date: 11/08/2017 CLINICAL DATA:  Pleural effusion. EXAM: CHEST - RIGHT DECUBITUS COMPARISON:  11/07/2017 FINDINGS: Decubital radiograph, right side down demonstrates layering left pleural effusion. There is a known right pleural effusion. Poor aeration of the right lung likely due to compression, large pulmonary masses and loculated right pleural effusion. IMPRESSION: Layering left pleural effusion. Loculated right pleural effusion.  Poor aeration of the right lung. Electronically Signed   By: Fidela Salisbury M.D.   On: 11/08/2017 21:36   Dg Chest Port 1 View  Result Date: 11/07/2017 CLINICAL DATA:  Pleural effusion.  EXAM: PORTABLE CHEST 1 VIEW COMPARISON:  Multiple prior studies including CT 11/12/2017 FINDINGS: The heart margins are obscured by significant opacity in the RIGHT hemithorax. A RIGHT-sided small bore pleural drain is in place, overlying the heart and RIGHT lung base. There is increased opacity in the RIGHT hemithorax, consistent with pleural effusion and parenchymal opacity. There is volume loss in the RIGHT lung. LEFT basilar opacity partially obscures the hemidiaphragm. Multiple LEFT-sided skin folds. No pneumothorax. There are distended loops of bowel in the LEFT UPPER QUADRANT the abdomen. Remote bilateral rib fractures. IMPRESSION: 1. Increased opacity in the RIGHT lung. 2. Stable LEFT basilar opacity. Electronically Signed   By: Nolon Nations M.D.   On: 11/07/2017 10:49        Scheduled Meds: . aspirin  81 mg Oral Daily  . atorvastatin  40 mg Oral q1800  . benzonatate  200 mg Oral BID  . docusate sodium  200 mg Oral BID  . feeding supplement (ENSURE ENLIVE)  237 mL Oral BID BM  . folic acid  1 mg Oral  Daily  . heparin  5,000 Units Subcutaneous Q8H  . insulin aspart  0-5 Units Subcutaneous QHS  . insulin aspart  0-9 Units Subcutaneous TID WC  . lacosamide  100 mg Oral BID  . levETIRAcetam  1,500 mg Oral BID  . multivitamin with minerals  1 tablet Oral Daily  . phenytoin  30 mg Oral BID  . sodium chloride flush  3 mL Intravenous Q12H  . tamsulosin  0.4 mg Oral Daily  . thiamine  100 mg Oral Daily   Continuous Infusions: . sodium chloride    . sodium chloride 10 mL/hr at 11/08/17 1200  . amiodarone 30 mg/hr (11/08/17 1654)     LOS: 13 days     Cordelia Poche, MD [facility] 11/09/2017, 8:26 AM Pager: [phone]  If 7PM-7AM, please contact night-coverage [email or web address] Password [facility] 11/09/2017, 8:26 AM

## 2017-11-09 NOTE — Progress Notes (Signed)
Inpatient Diabetes Program Recommendations  AACE/ADA: New Consensus Statement on Inpatient Glycemic Control (2015)  Target Ranges:  Prepandial:   less than 140 mg/dL      Peak postprandial:   less than 180 mg/dL (1-2 hours)      Critically ill patients:  140 - 180 mg/dL   Results for Wyatt Salas, Wyatt Salas (MRN [id number]) as of 11/09/2017 11:38  Ref. Range 11/09/2017 07:55 11/09/2017 11:17  Glucose-Capillary Latest Ref Range: 65 - 99 mg/dL 245 (H) 208 (H)     Home DM Meds: Metformin 500 mg daily  Current: Novolog Sensitive Correction Scale/ SSI (0-9 units) TID AC + HS      Current Hemoglobin A1c level pending.  CBGs elevated today.   MD- If fasting glucose levels stay elevated, please consider starting low dose Lantus:  Lantus 6 units daily (0.1 units/kg dosing based on weight of 64 kg)     --Will follow patient during hospitalization--  Wyn Quaker RN, MSN, CDE Diabetes Coordinator Inpatient Glycemic Control Team Team Pager: [phone] (8a-5p)

## 2017-11-09 NOTE — Progress Notes (Signed)
  Echocardiogram 2D Echocardiogram has been performed.  Johny Chess 11/09/2017, 10:44 AM

## 2017-11-09 NOTE — Progress Notes (Addendum)
Name: Wyatt Salas MRN: [id number] DOB: April 09, 1951    ADMISSION DATE:  11/16/2017 CONSULTATION DATE:  10/28/17  REFERRING MD :  Dr. Sloan Leiter / [facility]   CHIEF COMPLAINT:  Lung Mass    HISTORY OF PRESENT ILLNESS:  67 y/o M who presented to [facility] ER on 4/9 with reports of left forehead & left shoulder pain after falling out of his wheelchair.  The patient was in a LUE cast on arrival from prior fall.  In addition, he reported cough with productive sputum.    The patient carries a history of ETOH abuse, DM, seizures, chronic diastolic CHF, TBI and known lung mass worrisome for cancer.  He was previously arranged for outpatient bronchoscopy with Dr. Lamonte Sakai in November 2018 but the patient canceled the appointment asking for a second opinion at the [place].  He reported on admit that he went to the [place] and was told he didn't have lung cancer.  At baseline, he lives alone and uses a four prong walker to transfer from bed to chair.    He was initially evaluated wihout acute findings / negative LUE XRAY, CT head/neck and was set for discharge (in ER).  At time of discharge, he was noted to have AFwRVR (150's).  The patient was admitted for further work up.  CBC showed a normocytic anemia with hgb of 12.  UDS negative, troponin negative.  CXR showed an interval increase in the size of the RUL posterior masslike abnormality (4 x 3.5 x 4.8 cm, previously 3.6 cm).  He was treated for AF with diltiazem and lopressor.    PCCM called for evaluation of lung mass.   Hospital course complicated by pleural effusion s/p thoracentesis 4/12.  Pleural fluid had a few malignant cells but not enough to call.  He further underwent FOB on 4/16 with poorly differentiated carcinoma.  Brushing consistent with non-small cell carcinoma.  The patient had return of dyspnea with reacumulation of pleural effusion.  He was further found to have a pericardial effusion requiring pericardiocentesis with 750 ml fluid removed.  Fluid consistent  with non-small cell carcinoma.  Oncology evaluation pending.  Palliative care following.   SUBJECTIVE:   The patient reports ongoing SOB, pleuritic chest pain and mild SOB.  States he needs to use the bedpan.  VITAL SIGNS: Temp:  [98.1 F (36.7 C)-98.9 F (37.2 C)] 98.4 F (36.9 C) (04/23 1115) Pulse Rate:  [93-127] 117 (04/23 1115) Resp:  [15-25] 21 (04/23 0600) BP: (92-132)/(60-88) 97/61 (04/23 1115) SpO2:  [92 %-100 %] 92 % (04/23 0600) Weight:  [141 lb 1.5 oz (64 kg)] 141 lb 1.5 oz (64 kg) (04/23 0454)  PHYSICAL EXAMINATION: General:  Cachectic male in NAD, appears even more frail than in the last few weeks at [place]: MM pink/moist, poor dentition  Neuro: Awake, alert, MAE, generalized weakness CV: s1s2 rrr, no m/r/g PULM: even/non-labored, lungs bilaterally diminished R>L  QJ:FHLK, non-tender, bsx4 active  Extremities: warm/dry, no edema, LUE in splint Skin: no rashes or lesions   Recent Labs  Lab 11/07/17 0848 11/08/17 0219 11/09/17 0404  NA 129* 128* 129*  K 4.5 4.0 4.1  CL 98* 98* 97*  CO2 23 23 24   BUN 9 6 8   CREATININE 0.58* 0.52* 0.52*  GLUCOSE 218* 175* 202*    Recent Labs  Lab 11/06/17 0301 11/07/17 0848 11/09/17 0404  HGB 10.6* 11.0* 10.2*  HCT 33.0* 35.5* 32.5*  WBC 7.6 10.3 11.7*  PLT 471* 497* 587*  Dg Chest Right Decubitus  Result Date: 11/08/2017 CLINICAL DATA:  Pleural effusion. EXAM: CHEST - RIGHT DECUBITUS COMPARISON:  11/07/2017 FINDINGS: Decubital radiograph, right side down demonstrates layering left pleural effusion. There is a known right pleural effusion. Poor aeration of the right lung likely due to compression, large pulmonary masses and loculated right pleural effusion. IMPRESSION: Layering left pleural effusion. Loculated right pleural effusion.  Poor aeration of the right lung. Electronically Signed   By: Fidela Salisbury M.D.   On: 11/08/2017 21:36      SIGNIFICANT EVENTS  4/09  Admit after falling out of  wheelchair, [facility] 4/11  PCCM consulted regarding lung mass 4/12  R Thoracentesis  4/16  FOB with biopsy/brushing c/w non-small cell 4/19  Pericardiocentesis with drain placement > malignant / non-small cell   STUDIES CT Head / Neck 4/9 >> negative for acute intracranial abnormality or cervical spine fracture, right cerebral hemisphere encephalomalacia, severe cerebellar atrophy CT Chest 4/9 >> interval increase in spiculated RUL mass now measuring 5.8 x5.3 x 5cm with progression of metastatic mediastinal and hilar adenopathy as well as development of satellite pulmonary nodules in the right lung.  Slight luminal narrowing of the distal right mainstem bronchus due to adenopathy without occlusion.  Ovoid density in the left major fissure may represent a pseudo lesion with fluid in the fissure vs an intra fissural mass measuring 2.2x1.2x1.7 cm.  Loculated moderate R pleural effusion.  CT Abd/Pelvis 4/9 >> marked urinary distention (approx 2L), no apparent evidence of metastatic disease within the abd/pelvis   CULTURES UC 4/9 >> less than 10K colonies of insignificant growth Sputum 4/10 >> normal flora Pleural Fluid 4/12 >> negative  Pericardial Fluid 4/19 >> negative    ANTIBIOTICS     ASSESSMENT / PLAN:  Discussion:  67 y/o M admitted after falling out his wheelchair.  Found to have AF with RVR. Known hx of lung mass and did not follow up despite being set up for outpatient bronchoscopy.  Current imaging notable for increase in size of RUL spiculated mass,  metastatic mediastinal disease, hilar adenopathy and satellite pulmonary nodules.  He has unfortunately been lost to follow up.  The patient (again) reports he would want his pastor (Rev. Floyde Parkins [phone]) to make decisions for him in the event he could not.  He has poor insight into his current situation and what seems to be limited health care literacy.  In addition, he reports a limited support system.  Despite this, he states  he would want treatment for malignancy if it is indeed cancer.  FOB on 4/16 consistent with non-small cell carcinoma.  Additionally, he has been found to have malignant pericardial effusion with tamponade physiology.    1. RUL Spiculated Mass / Stage IV Non-Small Cell Carcinoma - with disease progression from prior imaging.      2. Malignant Right Pleural Effusion   3. Malignant Pericardial Effusion   4.  AFwRVR  5.  Chronic Combined Systolic / Diastolic CHF   4. Weight Loss / Moderate to Severe Malnutrition   5. ETOH Abuse / Poor Dentition   Plan: Continue ongoing palliative care discussions  See note regarding decision maker from above (per patient) Await Oncology input regarding candidacy for therapy.  Doubt he would tolerate therapy.  Follow intermittent CXR (layering by XRAY on 4/22) If enlarging pleural effusion, would consider placement of PleurX catheter per IR for palliative relief of dyspnea  Consider PRN dyspnea relief with morphine > defer to palliative / primary  MD   PCCM will be available PRN.  Please call back if new needs arise.   Noe Gens, NP-C [facility] Pgr: [phone] or if no answer [phone] 11/09/2017, 11:38 AM

## 2017-11-09 NOTE — Progress Notes (Signed)
Oncology Nurse Navigator Documentation  Oncology Nurse Navigator Flowsheets 11/09/2017  Navigator Location [facility]-  Navigator Encounter Type Other/per [name], I notified scheduling team to call and make a follow up appt for patient to see [name] in 1 week to 10 days.    Treatment Phase Pre-Tx/Tx Discussion  Barriers/Navigation Needs Coordination of Care  Interventions Coordination of Care  Coordination of Care Other  Acuity Level 1  Time Spent with Patient 15

## 2017-11-09 NOTE — Progress Notes (Signed)
Progress Note  Patient Name: Wyatt Salas Date of Encounter: 11/09/2017  Primary Cardiologist: Glenetta Hew, MD   Subjective   Complains of cough with increased mucus production as well as pleuritic chest pain  Inpatient Medications    Scheduled Meds: . aspirin  81 mg Oral Daily  . atorvastatin  40 mg Oral q1800  . benzonatate  200 mg Oral BID  . docusate sodium  200 mg Oral BID  . feeding supplement (ENSURE ENLIVE)  237 mL Oral BID BM  . folic acid  1 mg Oral Daily  . heparin  5,000 Units Subcutaneous Q8H  . insulin aspart  0-5 Units Subcutaneous QHS  . insulin aspart  0-9 Units Subcutaneous TID WC  . lacosamide  100 mg Oral BID  . levETIRAcetam  1,500 mg Oral BID  . multivitamin with minerals  1 tablet Oral Daily  . phenytoin  30 mg Oral BID  . sodium chloride flush  3 mL Intravenous Q12H  . tamsulosin  0.4 mg Oral Daily  . thiamine  100 mg Oral Daily   Continuous Infusions: . sodium chloride    . sodium chloride 10 mL/hr at 11/08/17 1200  . amiodarone 30 mg/hr (11/08/17 1654)   PRN Meds: sodium chloride, acetaminophen, diltiazem, guaiFENesin-dextromethorphan, HYDROcodone-acetaminophen, ondansetron **OR** ondansetron (ZOFRAN) IV, phenol, polyethylene glycol, polyvinyl alcohol, sodium chloride flush   Vital Signs    Vitals:   11/08/17 2356 11/09/17 0454 11/09/17 0600 11/09/17 0749  BP: 104/86  93/69 101/68  Pulse: 93  (!) 127 (!) 123  Resp: (!) 25  (!) 21   Temp: 98.5 F (36.9 C)  98.6 F (37 C) 98.5 F (36.9 C)  TempSrc: Oral  Oral Oral  SpO2: 95%  92%   Weight:  141 lb 1.5 oz (64 kg)    Height:        Intake/Output Summary (Last 24 hours) at 11/09/2017 0919 Last data filed at 11/09/2017 0800 Gross per 24 hour  Intake 803.6 ml  Output 420 ml  Net 383.6 ml   Filed Weights   11/07/17 0632 11/08/17 0600 11/09/17 0454  Weight: 127 lb 10.3 oz (57.9 kg) 132 lb 7.9 oz (60.1 kg) 141 lb 1.5 oz (64 kg)    Telemetry    Atrial fibrillation with RVR-  Personally Reviewed  ECG    No new EKG to review- Personally Reviewed  Physical Exam   GEN: No acute distress.   Neck: No JVD Cardiac:  Irregularly irregular and tachycardic, no murmurs, rubs, or gallops.  Respiratory: Clear to auscultation bilaterally anteriorly with diminished breath sounds on the right GI: Soft, nontender, non-distended  MS: No edema; No deformity. Neuro:  Nonfocal  Psych: Normal affect   Labs    Chemistry Recent Labs  Lab 11/03/17 0425  11/07/17 0848 11/08/17 0219 11/09/17 0404  NA 133*   < > 129* 128* 129*  K 4.8   < > 4.5 4.0 4.1  CL 99*   < > 98* 98* 97*  CO2 22   < > 23 23 24   GLUCOSE 163*   < > 218* 175* 202*  BUN 9   < > 9 6 8   CREATININE 0.57*   < > 0.58* 0.52* 0.52*  CALCIUM 9.4   < > 9.1 8.8* 8.8*  PROT 7.0  --   --   --   --   ALBUMIN 2.4*  --   --   --   --   AST 57*  --   --   --   --  ALT 53  --   --   --   --   ALKPHOS 88  --   --   --   --   BILITOT 0.3  --   --   --   --   GFRNONAA >60   < > >60 >60 >60  GFRAA >60   < > >60 >60 >60  ANIONGAP 12   < > 8 7 8    < > = values in this interval not displayed.     Hematology Recent Labs  Lab 11/06/17 0301 11/07/17 0848 11/09/17 0404  WBC 7.6 10.3 11.7*  RBC 4.10* 4.37 4.04*  HGB 10.6* 11.0* 10.2*  HCT 33.0* 35.5* 32.5*  MCV 80.5 81.2 80.4  MCH 25.9* 25.2* 25.2*  MCHC 32.1 31.0 31.4  RDW 15.0 15.1 15.0  PLT 471* 497* 587*    Cardiac EnzymesNo results for input(s): TROPONINI in the last 168 hours. No results for input(s): TROPIPOC in the last 168 hours.   BNPNo results for input(s): BNP, PROBNP in the last 168 hours.   DDimer No results for input(s): DDIMER in the last 168 hours.   Radiology    Dg Chest Right Decubitus  Result Date: 11/08/2017 CLINICAL DATA:  Pleural effusion. EXAM: CHEST - RIGHT DECUBITUS COMPARISON:  11/07/2017 FINDINGS: Decubital radiograph, right side down demonstrates layering left pleural effusion. There is a known right pleural effusion. Poor  aeration of the right lung likely due to compression, large pulmonary masses and loculated right pleural effusion. IMPRESSION: Layering left pleural effusion. Loculated right pleural effusion.  Poor aeration of the right lung. Electronically Signed   By: Fidela Salisbury M.D.   On: 11/08/2017 21:36   Dg Chest Port 1 View  Result Date: 11/07/2017 CLINICAL DATA:  Pleural effusion. EXAM: PORTABLE CHEST 1 VIEW COMPARISON:  Multiple prior studies including CT 11/01/2017 FINDINGS: The heart margins are obscured by significant opacity in the RIGHT hemithorax. A RIGHT-sided small bore pleural drain is in place, overlying the heart and RIGHT lung base. There is increased opacity in the RIGHT hemithorax, consistent with pleural effusion and parenchymal opacity. There is volume loss in the RIGHT lung. LEFT basilar opacity partially obscures the hemidiaphragm. Multiple LEFT-sided skin folds. No pneumothorax. There are distended loops of bowel in the LEFT UPPER QUADRANT the abdomen. Remote bilateral rib fractures. IMPRESSION: 1. Increased opacity in the RIGHT lung. 2. Stable LEFT basilar opacity. Electronically Signed   By: Nolon Nations M.D.   On: 11/07/2017 10:49    Cardiac Studies   2D echo 11/03/2017 Study Conclusions  - Left ventricle: The cavity size was normal. Wall thickness was normal. Systolic function was mildly to moderately reduced. The estimated ejection fraction was in the range of 40% to 45%. Diffuse hypokinesis. Features are consistent with a pseudonormal left ventricular filling pattern, with concomitant abnormal relaxation and increased filling pressure (grade 2 diastolic dysfunction). - Mitral valve: There was mild regurgitation. - Tricuspid valve: There was trivial regurgitation. - Pericardium, extracardiac: A large pericardial effusion (up to 3cm) was identified circumferential to the heart. The fluid had no internal echoes. No RA or RV collapse. IVC is small  in diameter but does not demonstrate respiratory variation. There was no significant evidence of hemodynamic compromise.  Impressions:  - Pericardial effusion is new when compared to prior.  2D echo limited 4/20 Study Conclusions  - Left ventricle: The cavity size was mildly dilated. The estimated ejection fraction was 40%. Diffuse hypokinesis. - Mitral valve: There was mild regurgitation. -  Pericardium, extracardiac: Post pericardiocentesis much improved with trivial residual effusion    Patient Profile     67 y.o. male with a hx of high fall risk with multiple falls in the past 2 months with multiple fractures, wheel chair dependent, remote hx of subdural hematoma s/p right sided craniotomy (?2000), seizure disorder, alcohol abuse, chronic hep C, HTN, HDL, chronic diastolic heart failure, and lung mass concerning for cancernow with a flutter RVR found on admit for fall.  Assessment & Plan    1.  Atrial flutter with RVR - HR still elevated despite IV amiodarone drip - Cardizem, Lopressor and digoxin had no effect on heart rate and were stopped because of soft blood pressure -Will rebolus with amiodarone 150 mg IV - CHADS2VASC score is elevated at 63 (age >27, HTN, DM, CHF) but not a candidate for anticoagulation due to history of multiple falls resulting in fractures as well as h/o subdural hematoma and bloody pericardial effusion.   2.  Large pericardial effusion with tamponade s/p pericardiocentesis  - 750cc bloody fluid removed.  Cytology confirms malignant pericardial effusion consistent with non-small cell carcinoma - repeat limited 2D echocardiogram today to make sure there has been no reaccumulation of fluid  3.  Chronic combined systolic/diastolic CHF - 2D echo this admit showed EF 40-45% with G2DD - possibly tachy mediated from rapid atrial flutter - hopefully EF will improve with HR control.   - cannot add BB/ACE I or ARB due to soft BP - not a cath  candidate at this point due to likely metastatic lung CA and inability to use antiplatelet agents if he needed PCI. - he put out 420 cc yesterday and is net -1.1 L - Creatinine is stable at 0.52  4.  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   - he wants everything done at this point.   -Patient continues to have cough and pleuritic chest pain with increased airspace opacity on 4/19 chest x-ray - pulmonary has been consulted for possible repeat thoracentesis/drain  5.  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      Signed, Fransico Him, MD  11/09/2017, 9:19 AM

## 2017-11-10 LAB — GLUCOSE, CAPILLARY
Glucose-Capillary: 179 mg/dL — ABNORMAL HIGH (ref 65–99)
Glucose-Capillary: 191 mg/dL — ABNORMAL HIGH (ref 65–99)
Glucose-Capillary: 177 mg/dL — ABNORMAL HIGH (ref 65–99)
Glucose-Capillary: 149 mg/dL — ABNORMAL HIGH (ref 65–99)

## 2017-11-10 LAB — HEMOGLOBIN A1C
Hgb A1c MFr Bld: 6.5 % — ABNORMAL HIGH (ref 4.8–5.6)
MEAN PLASMA GLUCOSE: 139.85 mg/dL

## 2017-11-10 MED ORDER — METOPROLOL TARTRATE 25 MG PO TABS
25.0000 mg | ORAL_TABLET | Freq: Two times a day (BID) | ORAL | Status: DC
  Administered 2017-11-10 – 2017-11-15 (×11): 25 mg via ORAL
  Filled 2017-11-10 (×12): qty 1

## 2017-11-10 MED ORDER — AMIODARONE HCL 200 MG PO TABS
400.0000 mg | ORAL_TABLET | Freq: Two times a day (BID) | ORAL | Status: DC
  Administered 2017-11-10 – 2017-11-15 (×12): 400 mg via ORAL
  Filled 2017-11-10 (×14): qty 2

## 2017-11-10 NOTE — Progress Notes (Signed)
Occupational Therapy Treatment Patient Details Name: Wyatt Salas MRN: [id number] DOB: 1951-04-10 Today's Date: 11/10/2017    History of present illness Pt is a 67 y.o. male with history of multiple falls, remote subdural hematoma status post right-sided craniotomy, seizure disorder, hepatitis C, hyperlipidemia, chronic diastolic congestive heart failure, alcohol abuse, lung cancer was admitted to the hospital concerning for atrial fibrillation with RVR. Pt with recent L distal humerus fracture and per orthopedics WBAT L UE with no ROM restrictions.    OT comments  Pt demonstrating progress toward OT goals this session. Facilitated improved functional use of L UE in preparation for ADL tasks with AROM, PROM, and L elbow and wrist gentle stretching this session. Pt is demonstrating improving AROM and ability to incorporate L UE into functional tasks when encouraged. He continues to require assistance for sit<>stand in preparation for toilet transfers. Updated goals to include HEP and dressing tasks in order to facilitate improvement toward PLOF. Continue to recommend SNF level rehabilitation post-acute D/C. Additionally recommended that nursing staff encourage functional use of L UE.    Follow Up Recommendations  SNF    Equipment Recommendations  3 in 1 bedside commode    Recommendations for Other Services      Precautions / Restrictions Precautions Precautions: Fall Precaution Comments: Per Dr [name], ROM OK to tolerance, WBAT Required Braces or Orthoses: Other Brace/Splint Other Brace/Splint: L wrist cock-up splint Restrictions Weight Bearing Restrictions: Yes LUE Weight Bearing: Weight bearing as tolerated       Mobility Bed Mobility               General bed mobility comments: OOB in recliner on my arrival. Had just finished bathing with RN and tech.  Transfers Overall transfer level: Needs assistance Equipment used: 1 person hand held assist Transfers: Sit to/from  Stand Sit to Stand: Mod assist         General transfer comment: Mod assist to power up to standing position. Simulated partial pivot but pt fatigued from recent bathing tasks.     Balance Overall balance assessment: Needs assistance Sitting-balance support: Single extremity supported;Feet supported Sitting balance-Leahy Scale: Good     Standing balance support: Bilateral upper extremity supported Standing balance-Leahy Scale: Poor Standing balance comment: Relies on UE support for balance                           ADL either performed or assessed with clinical judgement   ADL Overall ADL's : Needs assistance/impaired Eating/Feeding: Set up;Sitting Eating/Feeding Details (indicate cue type and reason): Encouraged pt to incorporate L UE into self-feeding tasks.                      Toilet Transfer: Moderate assistance Toilet Transfer Details (indicate cue type and reason): Able to complete sit<>stand and simulate pivot with mod assist this session without RW.  Toileting- Clothing Manipulation and Hygiene: Moderate assistance;Sit to/from stand         General ADL Comments: Session focused on improved functional use of L UE. He is quick to state "I can't do that" but encouraged to incorporate L hand into functional tasks to tolerance.      Vision       Perception     Praxis      Cognition Arousal/Alertness: Awake/alert Behavior During Therapy: WFL for tasks assessed/performed Overall Cognitive Status: Impaired/Different from baseline Area of Impairment: Problem solving;Attention  Current Attention Level: Selective         Problem Solving: Slow processing General Comments: Pt able to follow commands well but requires cues to complete L elbow and wrist ROM exercises appropriately.         Exercises Exercises: General Upper Extremity General Exercises - Upper Extremity Elbow Flexion: AAROM;Left;15 reps;Seated Elbow  Extension: AAROM;Left;15 reps;Seated Wrist Flexion: Left;10 reps;Seated;AAROM(assist from OT to isolate wrist movement) Wrist Extension: AROM;Left;10 reps;Seated(assist from OT to isolate wrist movement) Digit Composite Flexion: AROM;Left;15 reps;Seated Composite Extension: AROM;Left;15 reps;Seated Other Exercises Other Exercises: Assisted pt to complete prolonged, gentle stretch to L elbow in flexion and extension with some pain at end ranges. Able to achieve ~130 degrees elbow flexion and lacking approximately 15 degrees of extension.  Other Exercises: Facilitated AAROM L forearm supination/pronation x10 repetitions.  Other Exercises: Facilitated L wrist ulnar and radial deviation with OT assisting to isolate wrist movement.  Other Exercises: Facilitated opposition of L thumb to all digits. Requires assistance for IV and V digits.  Other Exercises: Facilitated thumb radial abduction x10 repetitions with difficulty isolating thumb movement from index movement.    Shoulder Instructions       General Comments      Pertinent Vitals/ Pain       Pain Assessment: Faces Faces Pain Scale: Hurts little more Pain Location: LUE with movement Pain Descriptors / Indicators: Aching;Sore;Grimacing Pain Intervention(s): Limited activity within patient's tolerance;Monitored during session;Repositioned(adjusted L wrist cock-up splint as this was placed loosely)  Home Living                                          Prior Functioning/Environment              Frequency  Min 2X/week        Progress Toward Goals  OT Goals(current goals can now be found in the care plan section)  Progress towards OT goals: Progressing toward goals(Added HEP goal and dressing goals)  Acute Rehab OT Goals Patient Stated Goal: none stated OT Goal Formulation: With patient Time For Goal Achievement: 11/12/17 Potential to Achieve Goals: Good ADL Goals Pt Will Perform Upper Body Dressing: (P)  sitting;with supervision Pt Will Perform Lower Body Dressing: (P) sit to/from stand;with supervision Pt/caregiver will Perform Home Exercise Program: Left upper extremity;Increased ROM;With written HEP provided;Independently  Plan Discharge plan remains appropriate    Co-evaluation                 AM-PAC PT "6 Clicks" Daily Activity     Outcome Measure   Help from another person eating meals?: A Little Help from another person taking care of personal grooming?: A Little Help from another person toileting, which includes using toliet, bedpan, or urinal?: A Lot Help from another person bathing (including washing, rinsing, drying)?: A Lot Help from another person to put on and taking off regular upper body clothing?: A Little Help from another person to put on and taking off regular lower body clothing?: A Lot 6 Click Score: 15    End of Session Equipment Utilized During Treatment: (L wrist cock-up splint)  OT Visit Diagnosis: Muscle weakness (generalized) (M62.81);Pain Pain - Right/Left: Left Pain - part of body: Arm   Activity Tolerance Patient tolerated treatment well   Patient Left in bed;with call bell/phone within reach;with bed alarm set   Nurse Communication Mobility status(Nurse tech - encourage pt to  use L hand as functional assist)        Time: 1548-1610 OT Time Calculation (min): 22 min  Charges: OT General Charges $OT Visit: 1 Visit OT Treatments $Therapeutic Activity: 8-22 mins  Norman Herrlich, MS OTR/L  Pager: [place] A Zanayah Shadowens 11/10/2017, 6:11 PM

## 2017-11-10 NOTE — Progress Notes (Signed)
Progress Note  Patient Name: Wyatt Salas Date of Encounter: 11/10/2017  Primary Cardiologist: Glenetta Hew, MD   Subjective   Denies any chest pain or shortness of breath today.  Inpatient Medications    Scheduled Meds: . aspirin  81 mg Oral Daily  . atorvastatin  40 mg Oral q1800  . benzonatate  200 mg Oral BID  . docusate sodium  200 mg Oral BID  . feeding supplement (ENSURE ENLIVE)  237 mL Oral BID BM  . folic acid  1 mg Oral Daily  . heparin  5,000 Units Subcutaneous Q8H  . insulin aspart  0-5 Units Subcutaneous QHS  . insulin aspart  0-9 Units Subcutaneous TID WC  . lacosamide  100 mg Oral BID  . levETIRAcetam  1,500 mg Oral BID  . multivitamin with minerals  1 tablet Oral Daily  . phenytoin  30 mg Oral BID  . sodium chloride flush  3 mL Intravenous Q12H  . tamsulosin  0.4 mg Oral Daily  . thiamine  100 mg Oral Daily   Continuous Infusions: . sodium chloride    . sodium chloride 10 mL/hr at 11/08/17 1200  . amiodarone 30 mg/hr (11/10/17 0520)   PRN Meds: sodium chloride, acetaminophen, diltiazem, guaiFENesin-dextromethorphan, HYDROcodone-acetaminophen, ondansetron **OR** ondansetron (ZOFRAN) IV, phenol, polyethylene glycol, polyvinyl alcohol, sodium chloride flush   Vital Signs    Vitals:   11/09/17 1946 11/10/17 0104 11/10/17 0405 11/10/17 0900  BP: 97/73 111/84 103/74 120/80  Pulse: (!) 124 (!) 124 (!) 122 (!) 125  Resp:  (!) 28 (!) 28   Temp: 98.7 F (37.1 C) 98.7 F (37.1 C) 98.7 F (37.1 C) 98.4 F (36.9 C)  TempSrc: Oral Oral Oral Oral  SpO2: 95% 91% 93% 92%  Weight:   135 lb (61.2 kg)   Height:        Intake/Output Summary (Last 24 hours) at 11/10/2017 1025 Last data filed at 11/10/2017 0656 Gross per 24 hour  Intake 440 ml  Output 820 ml  Net -380 ml   Filed Weights   11/08/17 0600 11/09/17 0454 11/10/17 0405  Weight: 132 lb 7.9 oz (60.1 kg) 141 lb 1.5 oz (64 kg) 135 lb (61.2 kg)    Telemetry    Atrial fibrillation with RVR  with heart rate in the 120s- Personally Reviewed  ECG    No new EKG to review- Personally Reviewed  Physical Exam   GEN: No acute distress.   Neck: No JVD Cardiac:  Irregularly irregular and tachycardic, no murmurs, rubs, or gallops.  Respiratory: Clear to auscultation bilaterally. GI: Soft, nontender, non-distended  MS: No edema; No deformity. Neuro:  Nonfocal  Psych: Normal affect   Labs    Chemistry Recent Labs  Lab 11/07/17 0848 11/08/17 0219 11/09/17 0404  NA 129* 128* 129*  K 4.5 4.0 4.1  CL 98* 98* 97*  CO2 23 23 24   GLUCOSE 218* 175* 202*  BUN 9 6 8   CREATININE 0.58* 0.52* 0.52*  CALCIUM 9.1 8.8* 8.8*  GFRNONAA >60 >60 >60  GFRAA >60 >60 >60  ANIONGAP 8 7 8      Hematology Recent Labs  Lab 11/06/17 0301 11/07/17 0848 11/09/17 0404  WBC 7.6 10.3 11.7*  RBC 4.10* 4.37 4.04*  HGB 10.6* 11.0* 10.2*  HCT 33.0* 35.5* 32.5*  MCV 80.5 81.2 80.4  MCH 25.9* 25.2* 25.2*  MCHC 32.1 31.0 31.4  RDW 15.0 15.1 15.0  PLT 471* 497* 587*    Cardiac EnzymesNo results for input(s): TROPONINI  in the last 168 hours. No results for input(s): TROPIPOC in the last 168 hours.   BNPNo results for input(s): BNP, PROBNP in the last 168 hours.   DDimer No results for input(s): DDIMER in the last 168 hours.   Radiology    Dg Chest Right Decubitus  Result Date: 11/08/2017 CLINICAL DATA:  Pleural effusion. EXAM: CHEST - RIGHT DECUBITUS COMPARISON:  11/07/2017 FINDINGS: Decubital radiograph, right side down demonstrates layering left pleural effusion. There is a known right pleural effusion. Poor aeration of the right lung likely due to compression, large pulmonary masses and loculated right pleural effusion. IMPRESSION: Layering left pleural effusion. Loculated right pleural effusion.  Poor aeration of the right lung. Electronically Signed   By: Fidela Salisbury M.D.   On: 11/08/2017 21:36    Cardiac Studies   2D echo 11/03/2017 Study Conclusions  - Left ventricle:  The cavity size was normal. Wall thickness was normal. Systolic function was mildly to moderately reduced. The estimated ejection fraction was in the range of 40% to 45%. Diffuse hypokinesis. Features are consistent with a pseudonormal left ventricular filling pattern, with concomitant abnormal relaxation and increased filling pressure (grade 2 diastolic dysfunction). - Mitral valve: There was mild regurgitation. - Tricuspid valve: There was trivial regurgitation. - Pericardium, extracardiac: A large pericardial effusion (up to 3cm) was identified circumferential to the heart. The fluid had no internal echoes. No RA or RV collapse. IVC is small in diameter but does not demonstrate respiratory variation. There was no significant evidence of hemodynamic compromise.  Impressions:  - Pericardial effusion is new when compared to prior.  2D echo limited 4/20 Study Conclusions  - Left ventricle: The cavity size was mildly dilated. The estimated ejection fraction was 40%. Diffuse hypokinesis. - Mitral valve: There was mild regurgitation. - Pericardium, extracardiac: Post pericardiocentesis much improved with trivial residual effusion   Patient Profile     67 y.o. male with a hx of high fall risk with multiple falls in the past 2 months with multiple fractures, wheel chair dependent, remote hx of subdural hematoma s/p right sided craniotomy (?2000), seizure disorder, alcohol abuse, chronic hep C, HTN, HDL, chronic diastolic heart failure, and lung mass concerning for cancernow with a flutterRVR found on admit for fall.  Assessment & Plan    1. Atrial flutter with RVR - HR still elevated despite IV amiodarone drip and repeat boluses - Cardizem, Lopressor and digoxin had no effect on heart rate and were stopped because of soft blood pressure -Change to amiodarone 400 mg twice daily to try to get heart rate better controlled -I am going to try beta-blocker  therapy again with Lopressor 25 mg twice daily -CHADS2VASC score is elevated at 42 (age >75, HTN, DM, CHF) but not a candidate for anticoagulation due to history of multiple falls resulting in fractures as well as h/o subdural hematoma and bloody pericardial effusion.   2. Large pericardial effusion with tamponade s/p pericardiocentesis  - 750cc bloody fluid removed. Cytology confirms malignant pericardial effusion consistent with non-small cell carcinoma -Repeat 2D echocardiogram yesterday showed EF 40 to 45% with akinesis of the inferior lateral myocardium.  There was a trivial pericardial effusion identified.  3. Chronic combined systolic/diastolic CHF - 2D echo this admit showed EF 40-45% with G2DD - possibly tachy mediated from rapid atrial flutter - hopefully EF will improve with HR control.  -Not been started on ACE/ARB due to soft blood pressure -Blockers were tried but apparently heart rate did not significantly respond.  I am going to retry Lopressor 25 mg twice daily along with amiodarone to see if we can get his heart rate better controlled - not a cath candidate at this point due to likely metastatic lung CA and inability to use antiplatelet agents if he needed PCI. - Urine output yesterday was 1.17 L and he is net -2.26 L - Weight is down 6 pounds from yesterday - BMET pending today  4. Non small cell lung CAlikely stage IV with mets to pericardium with malignant effusion -Patient seen by oncology yesterday commended palliative care to the patient. -Per oncology notes patient is interested in treatment but would like to receive his treatment in [place] to be close to his family  5.  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      Signed, Fransico Him, MD  11/10/2017,  10:25 AM

## 2017-11-10 NOTE — Progress Notes (Signed)
PROGRESS NOTE    AWS SHERE  LFY:[id number] DOB: [birth year] DOA: 10/21/2017 PCP: [name], MD   Brief Narrative:  67 year old with history of multiple falls, wheelchair dependent, remote subdural hematoma status post right-sided craniotomy, seizure disorder, hepatitis C, hyperlipidemia, chronic diastolic congestive heart failure, alcohol abuse, lung cancer was admitted to the hospital concerning for atrial fibrillation with RVR.  Upon admission on the chest x-ray there was concern of right upper lobe posterior mass like lesion.  His atrial fibrillation was treated with Cardizem and Lopressor.  He also developed pleural effusion during his hospital course and underwent thoracentesis on October 29, 2017.  Pleural fluid suggested of few malignant cells and then later suggested he had poorly differentiated non-small cell carcinoma.  He is also developed pericardial effusion which is noted to be malignant and about 750 cc of this was drained.  His atrial fibrillation has remained difficult to control therefore currently is on amiodarone.   Assessment & Plan:   Principal Problem:   Atrial fibrillation with rapid ventricular response (HCC) Active Problems:   Seizure disorder (HCC)   Type 2 diabetes mellitus with hyperlipidemia (HCC)   Lung mass   Essential hypertension   Acute urinary retention   Chronic diastolic CHF (congestive heart failure) (HCC)   Closed fracture of distal end of left humerus   Protein-calorie malnutrition, severe   Acute combined systolic and diastolic heart failure (HCC)   Cardiac/pericardial tamponade   Goals of care, counseling/discussion   Palliative care by specialist   Atrial flutter ([place])   Malignant pericardial effusion (HCC)  Atrial flutter with rapid ventricular response rate; CHADScVASC 4 -Amiodarone dose has been increased to 400 mg twice daily and Lopressor 25 mg twice daily added -Not a good candidate for anticoagulation due to risk of falling  and previous history of subdural hematoma and pericardial effusion  Non-small cell lung carcinoma Malignant pericardial effusion -Thus far 750 cc of bloody fluid has been removed.  Cytology confirmed with some malignant effusion from non-small cell carcinoma - Repeat 2D echocardiogram 2/23 showing ejection fraction 40-45, questionable pericardial effusion 40-45% with akinesis of inferior lateral myocardium -Patient would prefer in [place] at the [facility] as that is where he is from.  Essential hypertension -Continue Lopressor  Chronic combined systolic and diastolic congestive heart failure, class II Coronary artery disease -Ejection fraction 40-45% with grade 2 diastolic dysfunction and diffuse hypokinesis -Closely monitor his volume status. -Continue aspirin, statin, beta-blocker.  Not on ACE inhibitor due to concerns of low blood pressure.  Left humerus fracture -Secondary to fall, follow-up outpatient with Ortho  Diabetes mellitus type 2 -Hemoglobin A1c- 6.5 Continue Lantus and sliding scale  History of seizure disorder -Continue Keppra and Dilantin  Urinary retention, improved Likely has underlying BPH -Continue Flomax  History of alcohol abuse -No longer at risk of withdrawal as he has been here for several days. -Continue thiamine 100 mg daily, multivitamin 1 tab daily  Osteoarthritis -Symptomatic management  Hyponatremia -Consistent with SIADH  Generalized weakness and deconditioning -Physical therapy recommends skilled nursing facility.  DVT prophylaxis: Lovenox Code Status: Full code  Family Communication:  none at bedside Disposition Plan: We will need skilled nursing facility per PT recommendations but for now maintain inpatient stay.  Consultants:   Cardiology  Procedures:   Thoracentesis 4/15  Bronchoscopy 4/16    Subjective: Denies any complaints this morning.  No other acute events overnight.  Review of Systems Otherwise negative  except as per HPI, including: General: Denies  fever, chills, night sweats or unintended weight loss. Resp: Denies cough, wheezing, shortness of breath. Cardiac: Denies chest pain, palpitations, orthopnea, paroxysmal nocturnal dyspnea. GI: Denies abdominal pain, nausea, vomiting, diarrhea or constipation GU: Denies dysuria, frequency, hesitancy or incontinence MS: Denies muscle aches, joint pain or swelling Neuro: Denies headache, neurologic deficits (focal weakness, numbness, tingling), abnormal gait Psych: Denies anxiety, depression, SI/HI/AVH Skin: Denies new rashes or lesions ID: Denies sick contacts, exotic exposures, travel  Objective: Vitals:   11/10/17 0104 11/10/17 0405 11/10/17 0900 11/10/17 1235  BP: 111/84 103/74 120/80   Pulse: (!) 124 (!) 122 (!) 125 (!) 123  Resp: (!) 28 (!) 28    Temp: 98.7 F (37.1 C) 98.7 F (37.1 C) 98.4 F (36.9 C)   TempSrc: Oral Oral Oral   SpO2: 91% 93% 92% 93%  Weight:  61.2 kg (135 lb)    Height:        Intake/Output Summary (Last 24 hours) at 11/10/2017 1326 Last data filed at 11/10/2017 0800 Gross per 24 hour  Intake 400 ml  Output 820 ml  Net -420 ml   Filed Weights   11/08/17 0600 11/09/17 0454 11/10/17 0405  Weight: 60.1 kg (132 lb 7.9 oz) 64 kg (141 lb 1.5 oz) 61.2 kg (135 lb)    Examination:  General exam: Appears calm and comfortable, generally ill-appearing with bilateral temporal wasting Respiratory system: Slightly coarse breath sounds diffusely Cardiovascular system: S1 & S2 heard, irregularly irregular heartbeat with tachycardia. No JVD, murmurs, rubs, gallops or clicks. No pedal edema. Gastrointestinal system: Abdomen is nondistended, soft and nontender. No organomegaly or masses felt. Normal bowel sounds heard. Central nervous system: Alert and oriented. No focal neurological deficits. Extremities: Symmetric 5 x 5 power. Skin: No rashes, lesions or ulcers Psychiatry: Judgement and insight appear normal. Mood &  affect appropriate.    Data Reviewed:   CBC: Recent Labs  Lab 11/03/2017 1852 11/06/17 0301 11/07/17 0848 11/09/17 0404  WBC 9.4 7.6 10.3 11.7*  HGB 10.8* 10.6* 11.0* 10.2*  HCT 33.2* 33.0* 35.5* 32.5*  MCV 80.2 80.5 81.2 80.4  PLT 441* 471* 497* 416*   Basic Metabolic Panel: Recent Labs  Lab 11/04/17 1301 10/24/2017 1852 11/06/17 0301 11/07/17 0848 11/08/17 0219 11/09/17 0404  NA 131*  --  131* 129* 128* 129*  K 4.2  --  4.2 4.5 4.0 4.1  CL 99*  --  100* 98* 98* 97*  CO2 22  --  21* 23 23 24   GLUCOSE 144*  --  161* 218* 175* 202*  BUN 11  --  6 9 6 8   CREATININE 0.49* 0.55* 0.51* 0.58* 0.52* 0.52*  CALCIUM 9.0  --  8.8* 9.1 8.8* 8.8*  MG 1.9  --   --   --   --   --    GFR: Estimated Creatinine Clearance: 76.5 mL/min (A) (by C-G formula based on SCr of 0.52 mg/dL (L)). Liver Function Tests: No results for input(s): AST, ALT, ALKPHOS, BILITOT, PROT, ALBUMIN in the last 168 hours. No results for input(s): LIPASE, AMYLASE in the last 168 hours. No results for input(s): AMMONIA in the last 168 hours. Coagulation Profile: No results for input(s): INR, PROTIME in the last 168 hours. Cardiac Enzymes: No results for input(s): CKTOTAL, CKMB, CKMBINDEX, TROPONINI in the last 168 hours. BNP (last 3 results) No results for input(s): PROBNP in the last 8760 hours. HbA1C: Recent Labs    11/10/17 0316  HGBA1C 6.5*   CBG: Recent Labs  Lab 11/09/17  1117 11/09/17 1701 11/09/17 2129 11/10/17 0607 11/10/17 1234  GLUCAP 208* 176* 177* 179* 191*   Lipid Profile: No results for input(s): CHOL, HDL, LDLCALC, TRIG, CHOLHDL, LDLDIRECT in the last 72 hours. Thyroid Function Tests: No results for input(s): TSH, T4TOTAL, FREET4, T3FREE, THYROIDAB in the last 72 hours. Anemia Panel: No results for input(s): VITAMINB12, FOLATE, FERRITIN, TIBC, IRON, RETICCTPCT in the last 72 hours. Sepsis Labs: No results for input(s): PROCALCITON, LATICACIDVEN in the last 168 hours.  Recent  Results (from the past 240 hour(s))  Body fluid culture     Status: None   Collection Time: 11/10/2017  4:33 PM  Result Value Ref Range Status   Specimen Description PERICARDIAL [facility]  Final   Special Requests NONE  Final   Gram Stain   Final    FEW WBC PRESENT, PREDOMINANTLY PMN NO ORGANISMS SEEN    Culture   Final    NO GROWTH 3 DAYS Performed at [facility], [address], [place], [place] [address]    Report Status 11/09/2017 FINAL  Final         Radiology Studies: Dg Chest Right Decubitus  Result Date: 11/08/2017 CLINICAL DATA:  Pleural effusion. EXAM: CHEST - RIGHT DECUBITUS COMPARISON:  11/07/2017 FINDINGS: Decubital radiograph, right side down demonstrates layering left pleural effusion. There is a known right pleural effusion. Poor aeration of the right lung likely due to compression, large pulmonary masses and loculated right pleural effusion. IMPRESSION: Layering left pleural effusion. Loculated right pleural effusion.  Poor aeration of the right lung. Electronically Signed   By: Fidela Salisbury M.D.   On: 11/08/2017 21:36        Scheduled Meds: . amiodarone  400 mg Oral BID  . aspirin  81 mg Oral Daily  . atorvastatin  40 mg Oral q1800  . benzonatate  200 mg Oral BID  . docusate sodium  200 mg Oral BID  . feeding supplement (ENSURE ENLIVE)  237 mL Oral BID BM  . folic acid  1 mg Oral Daily  . heparin  5,000 Units Subcutaneous Q8H  . insulin aspart  0-5 Units Subcutaneous QHS  . insulin aspart  0-9 Units Subcutaneous TID WC  . lacosamide  100 mg Oral BID  . levETIRAcetam  1,500 mg Oral BID  . metoprolol tartrate  25 mg Oral BID  . multivitamin with minerals  1 tablet Oral Daily  . phenytoin  30 mg Oral BID  . sodium chloride flush  3 mL Intravenous Q12H  . tamsulosin  0.4 mg Oral Daily  . thiamine  100 mg Oral Daily   Continuous Infusions: . sodium chloride    . sodium chloride 10 mL/hr at 11/08/17 1200     LOS: 14 days    Time spent: 35  mins    Ankit Arsenio Loader, MD [facility] Pager [phone]   If 7PM-7AM, please contact night-coverage [email or web address] Password [email or web address] 11/10/2017, 1:26 PM

## 2017-11-11 LAB — CBC
HEMATOCRIT: 31.2 % — AB (ref 39.0–52.0)
Hemoglobin: 10 g/dL — ABNORMAL LOW (ref 13.0–17.0)
MCH: 25.2 pg — ABNORMAL LOW (ref 26.0–34.0)
MCHC: 32.1 g/dL (ref 30.0–36.0)
MCV: 78.6 fL (ref 78.0–100.0)
PLATELETS: 553 10*3/uL — AB (ref 150–400)
RBC: 3.97 MIL/uL — AB (ref 4.22–5.81)
RDW: 14.6 % (ref 11.5–15.5)
WBC: 11.8 10*3/uL — AB (ref 4.0–10.5)

## 2017-11-11 LAB — BASIC METABOLIC PANEL
ANION GAP: 10 (ref 5–15)
BUN: 8 mg/dL (ref 6–20)
CHLORIDE: 94 mmol/L — AB (ref 101–111)
CO2: 22 mmol/L (ref 22–32)
Calcium: 8.7 mg/dL — ABNORMAL LOW (ref 8.9–10.3)
Creatinine, Ser: 0.51 mg/dL — ABNORMAL LOW (ref 0.61–1.24)
GFR calc non Af Amer: >60 mL/min (ref >60)
GLUCOSE: 162 mg/dL — AB (ref 65–99)
Potassium: 4.4 mmol/L (ref 3.5–5.1)
Sodium: 126 mmol/L — ABNORMAL LOW (ref 135–145)

## 2017-11-11 LAB — GLUCOSE, CAPILLARY
Glucose-Capillary: 190 mg/dL — ABNORMAL HIGH (ref 65–99)
Glucose-Capillary: 193 mg/dL — ABNORMAL HIGH (ref 65–99)
Glucose-Capillary: 133 mg/dL — ABNORMAL HIGH (ref 65–99)
Glucose-Capillary: 120 mg/dL — ABNORMAL HIGH (ref 65–99)

## 2017-11-11 LAB — MAGNESIUM: MAGNESIUM: 1.7 mg/dL (ref 1.7–2.4)

## 2017-11-11 LAB — BRAIN NATRIURETIC PEPTIDE: B NATRIURETIC PEPTIDE 5: 316 pg/mL — AB (ref 0.0–100.0)

## 2017-11-11 NOTE — Progress Notes (Addendum)
Nutrition Follow-up  DOCUMENTATION CODES:   Severe malnutrition in context of chronic illness, Underweight  INTERVENTION:    Continue Ensure Enlive po BID, each supplement provides 350 kcal and 20 grams of protein  NUTRITION DIAGNOSIS:   Severe Malnutrition related to chronic illness(TBI) as evidenced by severe fat depletion, severe muscle depletion, ongoing  GOAL:   Patient will meet greater than or equal to 90% of their needs, progressing   MONITOR:   PO intake, Supplement acceptance, Weight trends, Labs  ASSESSMENT:   67 y.o. male with history of alcohol use, seizure disorder, DM-2, hypertension resented to the ED following a mechanical fall, found to be in A. fib with RVR and admitted to the hospitalist service.   Pt working with therapies upon RD visit. PO intake variable at 0-75% per flowsheet records. Receiving Ensure Enlive nutrition supplements BID. Medications include MVI, folvite, and thiamine.  Palliative Medicine Team following. Labs reviewed. Na 126 (L). Oncology note reviewed. CBG's [id number].  Diet Order:  DIET DYS 3 Room service appropriate? Yes with Assist; Fluid consistency: Thin; Fluid restriction: 1200 mL Fluid  EDUCATION NEEDS:   No education needs have been identified at this time  Skin:  Skin Assessment: Reviewed RN Assessment  Last BM:  4/23  Height:   Ht Readings from Last 1 Encounters:  11/13/2017 6\' 1"  (1.854 m)   Weight:   Wt Readings from Last 1 Encounters:  11/11/17 134 lb 0.6 oz (60.8 kg)   Ideal Body Weight:  83.64 kg  BMI:  Body mass index is 17.68 kg/m.  Estimated Nutritional Needs:   Kcal:  1900-2100  Protein:  80-95 gm  Fluid:  1.9-2.1 L  Arthur Holms, RD, LDN Pager #: [phone] After-Hours Pager #: [phone]

## 2017-11-11 NOTE — Progress Notes (Signed)
Occupational Therapy Treatment Patient Details Name: Wyatt Salas MRN: [id number] DOB: 1951/05/25 Today's Date: 11/11/2017    History of present illness Pt is a 67 y.o. male with history of multiple falls, remote subdural hematoma status post right-sided craniotomy, seizure disorder, hepatitis C, hyperlipidemia, chronic diastolic congestive heart failure, alcohol abuse, lung cancer was admitted to the hospital concerning for atrial fibrillation with RVR. Pt with recent L distal humerus fracture and per orthopedics WBAT L UE with no ROM restrictions.    OT comments  Pt progressing towards OT goals this session. Pt reports not feeling as well today, however still agreeable to EOB exercises (UE and LE), followed by chair transfer (min A +2) and sit <> stand x3. OT will continue to follow in the acute setting. SNF remains appropriate and essential for return to PLOF.   Follow Up Recommendations  SNF    Equipment Recommendations  3 in 1 bedside commode    Recommendations for Other Services      Precautions / Restrictions Precautions Precautions: Fall Precaution Comments: Per Dr [name], ROM OK to tolerance, WBAT Required Braces or Orthoses: Other Brace/Splint Other Brace/Splint: L wrist cock-up splint Restrictions Weight Bearing Restrictions: Yes LUE Weight Bearing: Weight bearing as tolerated       Mobility Bed Mobility Overal bed mobility: Needs Assistance Bed Mobility: Supine to Sit     Supine to sit: Mod assist     General bed mobility comments: Mod A for trunk elevation, and use of bed pad to assist with hips EOB  Transfers Overall transfer level: Needs assistance Equipment used: 2 person hand held assist Transfers: Sit to/from [facility] Sit to Stand: Min assist;+2 physical assistance;+2 safety/equipment Stand pivot transfers: Min assist;+2 physical assistance;+2 safety/equipment       General transfer comment: rocking used to gather momentum, vc for  hand placement, posterior lean in standing    Balance Overall balance assessment: Needs assistance Sitting-balance support: Single extremity supported;Feet supported Sitting balance-Leahy Scale: Good Sitting balance - Comments: Able to perform sitting balance during LB exercises and functional grooming/dressing tasks Postural control: Posterior lean(in standing) Standing balance support: Bilateral upper extremity supported Standing balance-Leahy Scale: Poor Standing balance comment: Relies on UE support for balance                           ADL either performed or assessed with clinical judgement   ADL Overall ADL's : Needs assistance/impaired     Grooming: Wash/dry face;Min guard;Sitting Grooming Details (indicate cue type and reason): sitting EOB             Lower Body Dressing: Sitting/lateral leans;Moderate assistance Lower Body Dressing Details (indicate cue type and reason): unable to don sock on LLE currently, able to cross legs to don RLE Toilet Transfer: Minimal assistance;+2 for physical assistance;Cueing for sequencing;Stand-pivot;+2 for safety/equipment Toilet Transfer Details (indicate cue type and reason): face to face HHA this session         Functional mobility during ADLs: Minimal assistance;+2 for physical assistance;+2 for safety/equipment(short distance (<5 ft at home with quad cane) ) General ADL Comments: Pt required continued vc to incorporate LUE into tasks such as wringing out cloth for face, pushing for transfers etc     Vision       Perception     Praxis      Cognition Arousal/Alertness: Awake/alert Behavior During Therapy: WFL for tasks assessed/performed Overall Cognitive Status: Impaired/Different from baseline Area of Impairment: Problem  solving;Attention                             Problem Solving: Slow processing General Comments: Pt able to follow commands well but requires cues to complete L elbow and  wrist ROM exercises appropriately.         Exercises Exercises: General Upper Extremity General Exercises - Upper Extremity Elbow Flexion: AAROM;Left;Seated;10 reps Elbow Extension: AAROM;Left;Seated;10 reps Digit Composite Flexion: AROM;Left;Seated;10 reps Composite Extension: AROM;Left;Seated;10 reps Other Exercises Other Exercises: used Lhand to assist with wringing out washcloth Other Exercises: Facilitated AAROM L forearm supination/pronation x10 repetitions.  Other Exercises: Facilitated opposition of L thumb to all digits. Requires assistance for IV and V digits.  Other Exercises: Facilitated thumb radial abduction x10 repetitions with difficulty isolating thumb movement from index movement.    Shoulder Instructions       General Comments "I don't feel as good today" O2 saturating between 80-88% when OT entered the room on RA. Pursed lip breathing education provided and the highest O2 would go was 88%. RN  notified and Pt placed on 2 L O2 prior to initiation of bed mobility. O2 on at least 92 or higher throughout session    Pertinent Vitals/ Pain       Pain Assessment: 0-10 Pain Score: 5  Pain Location: Left Side Pain Descriptors / Indicators: Grimacing;Tender;Burning Pain Intervention(s): Monitored during session;Premedicated before session;Repositioned  Home Living                                          Prior Functioning/Environment              Frequency  Min 2X/week        Progress Toward Goals  OT Goals(current goals can now be found in the care plan section)  Progress towards OT goals: Progressing toward goals  Acute Rehab OT Goals Patient Stated Goal: none stated OT Goal Formulation: With patient Time For Goal Achievement: 11/12/17 Potential to Achieve Goals: Good  Plan Discharge plan remains appropriate    Co-evaluation    PT/OT/SLP Co-Evaluation/Treatment: Yes Reason for Co-Treatment: For patient/therapist safety;To  address functional/ADL transfers PT goals addressed during session: Mobility/safety with mobility;Balance;Strengthening/ROM OT goals addressed during session: ADL's and self-care;Strengthening/ROM      AM-PAC PT "6 Clicks" Daily Activity     Outcome Measure   Help from another person eating meals?: A Little Help from another person taking care of personal grooming?: A Little Help from another person toileting, which includes using toliet, bedpan, or urinal?: A Lot Help from another person bathing (including washing, rinsing, drying)?: A Lot Help from another person to put on and taking off regular upper body clothing?: A Little Help from another person to put on and taking off regular lower body clothing?: A Lot 6 Click Score: 15    End of Session Equipment Utilized During Treatment: Oxygen;Other (comment)(2L; L wrist cock up splint)  OT Visit Diagnosis: Muscle weakness (generalized) (M62.81);Pain Pain - Right/Left: Left Pain - part of body: Arm;Leg   Activity Tolerance Patient tolerated treatment well   Patient Left in chair;with call bell/phone within reach;with chair alarm set   Nurse Communication Mobility status;Other (comment)(please encourage use of LUE)        Time: 5093-2671 OT Time Calculation (min): 35 min  Charges: OT General Charges $OT Visit: 1 Visit OT  Treatments $Self Care/Home Management : 8-22 mins  Hulda Humphrey OTR/L [place] 11/11/2017, 12:54 PM

## 2017-11-11 NOTE — Progress Notes (Signed)
CSW met with pt to confirm plan- patient is agreeable to going to short term rehab at [facility] to build strength while he tries to work things out to go to [place] with family  Pt reports his [place] social worker is [name]- CSW left message for [name] to discuss what patient would need to do to transfer services to [place] for cancer treatment  Pt has not discussed his situation with family in [place] encouraged him to speak with them as soon as possible  Jorge Ny, [place] Social Worker [phone]

## 2017-11-11 NOTE — Progress Notes (Signed)
PROGRESS NOTE    Wyatt Salas  RDE:[id number] DOB: 08/06/1950 DOA: 10/20/2017 PCP: [name], MD   Brief Narrative:  67 year old with history of multiple falls, wheelchair dependent, remote subdural hematoma status post right-sided craniotomy, seizure disorder, hepatitis C, hyperlipidemia, chronic diastolic congestive heart failure, alcohol abuse, lung cancer was admitted to the hospital concerning for atrial fibrillation with RVR.  Upon admission on the chest x-ray there was concern of right upper lobe posterior mass like lesion.  His atrial fibrillation was treated with Cardizem and Lopressor.  He also developed pleural effusion during his hospital course and underwent thoracentesis on October 29, 2017.  Pleural fluid suggested of few malignant cells and then later suggested he had poorly differentiated non-small cell carcinoma.  He is also developed pericardial effusion which is noted to be malignant and about 750 cc of this was drained.  His atrial fibrillation has remained difficult to control therefore currently is on amiodarone and Lopressor.  Cardiology is following but difficult to titrate this given his soft blood pressure.  Patient is also been seen by palliative care.   Assessment & Plan:   Principal Problem:   Atrial fibrillation with rapid ventricular response (HCC) Active Problems:   Seizure disorder (HCC)   Type 2 diabetes mellitus with hyperlipidemia (HCC)   Lung mass   Essential hypertension   Acute urinary retention   Chronic diastolic CHF (congestive heart failure) (HCC)   Closed fracture of distal end of left humerus   Protein-calorie malnutrition, severe   Acute combined systolic and diastolic heart failure (HCC)   Cardiac/pericardial tamponade   Goals of care, counseling/discussion   Palliative care by specialist   Atrial flutter ([place])   Malignant pericardial effusion (HCC)  Atrial flutter with rapid ventricular response rate; CHADScVASC 4 -Continue  amiodarone at this time, Lopressor 25 mg twice daily added 4/24.  No room to uptitrate today as her blood pressure has soft. -Appreciate cardiology input. 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  Repeat echocardiogram showed questionable effusion.  Will need to closely monitor this for any signs of decompensation. -She would prefer to follow-up in [place] at the [facility] because that is where he is from. - I do not think patient is strong enough at this time running chemoradiation.  Palliative care team is following.  Essential hypertension -Continue Lopressor  Chronic combined systolic and diastolic congestive heart failure, class II Coronary artery disease -Ejection fraction 40-45% with grade 2 diastolic dysfunction and diffuse hypokinesis -Closely monitor his volume status. -Continue aspirin, statin, beta-blocker.  Not on ACE inhibitor due to concerns of low blood pressure.  Left humerus fracture -Secondary to fall, follow-up outpatient with Ortho  Diabetes mellitus type 2 -Hemoglobin A1c- 6.5 Continue Lantus and sliding scale  History of seizure disorder -Continue Keppra and Dilantin  Urinary retention, Foley is in place Likely has underlying BPH -Continue Flomax.  Plans to remove Foley today and watch him for any signs of urinary retention  History of alcohol abuse -No longer at risk of withdrawal as he has been here for several days. -Continue thiamine 100 mg daily, multivitamin 1 tab daily  Osteoarthritis -Symptomatic management  Hyponatremia -Consistent with SIADH  Generalized weakness and deconditioning Moderate  protein calorie malnutrition -Physical therapy recommends skilled nursing facility.  At this time holding off on transitioning him to skilled nursing facility given his uncontrolled heart rate.  DVT prophylaxis: Lovenox Code Status: Full code  Family Communication:  none at bedside Disposition Plan: Maintain inpatient stay for better heart rate control.  Cardiology is following.  Once this is better to begin transitioning to skilled nursing facility.  Consultants:   Cardiology  Procedures:   Thoracentesis 4/15  Bronchoscopy 4/16  Subjective: No complaints overnight.  Review of Systems Otherwise negative except as per HPI, including: HEENT/EYES = negative for pain, redness, loss of vision, double vision, blurred vision, loss of hearing, sore throat, hoarseness, dysphagia Cardiovascular= negative for chest pain, palpitation, murmurs, lower extremity swelling Respiratory/lungs= negative for shortness of breath, cough, hemoptysis, wheezing, mucus production Gastrointestinal= negative for nausea, vomiting,, abdominal pain, melena, hematemesis Genitourinary= negative for Dysuria, Hematuria, Change in Urinary Frequency MSK = Negative for arthralgia, myalgias, Back Pain, Joint swelling  Neurology= Negative for headache, seizures, numbness, tingling  Psychiatry= Negative for anxiety, depression, suicidal and homocidal ideation Allergy/Immunology= Medication/Food allergy as listed  Skin= Negative for Rash, lesions, ulcers, itching   Objective: Vitals:   11/11/17 0430 11/11/17 0500 11/11/17 0817 11/11/17 1141  BP: 99/70  92/66 94/72  Pulse: (!) 122     Resp: 18  17   Temp: 98.7 F (37.1 C)  98.4 F (36.9 C)   TempSrc: Oral  Oral   SpO2: 91%  90%   Weight:  60.8 kg (134 lb 0.6 oz)    Height:        Intake/Output Summary (Last 24 hours) at 11/11/2017 1142 Last data filed at 11/11/2017 1020 Gross per 24 hour  Intake 480 ml  Output 900 ml  Net -420 ml   Filed Weights   11/09/17 0454 11/10/17 0405 11/11/17 0500  Weight: 64 kg (141 lb 1.5 oz) 61.2 kg (135 lb)  60.8 kg (134 lb 0.6 oz)    Examination:  Constitutional: NAD, calm, comfortable, generally ill-appearing with bilateral temporal wasting Eyes: PERRL, lids and conjunctivae normal ENMT: Mucous membranes are moist. Posterior pharynx clear of any exudate or lesions.Normal dentition.  Neck: normal, supple, no masses, no thyromegaly Respiratory: Slightly diffuse coarse breath sounds throughout Cardiovascular: Irregular irregular rate and rhythm with tachycardia, no murmurs / rubs / gallops. No extremity edema. 2+ pedal pulses. No carotid bruits.  Abdomen: no tenderness, no masses palpated. No hepatosplenomegaly. Bowel sounds positive.  Musculoskeletal: no clubbing / cyanosis. No joint deformity upper and lower extremities. Good ROM, no contractures. Normal muscle tone.  Skin: no rashes, lesions, ulcers. No induration Neurologic: CN 2-12 grossly intact. Sensation intact, DTR normal. Strength 5/5 in all 4.  Psychiatric: Normal judgment and insight. Alert and oriented x 3. Normal mood.    Data Reviewed:   CBC: Recent Labs  Lab 10/22/2017 1852 11/06/17 0301 11/07/17 0848 11/09/17 0404 11/11/17 0437  WBC 9.4 7.6 10.3 11.7* 11.8*  HGB 10.8* 10.6* 11.0* 10.2* 10.0*  HCT 33.2* 33.0* 35.5* 32.5* 31.2*  MCV 80.2 80.5 81.2 80.4 78.6  PLT 441* 471* 497* 587* 588*   Basic Metabolic Panel: Recent Labs  Lab 11/04/17 1301  11/06/17 0301 11/07/17 0848 11/08/17 0219 11/09/17 0404 11/11/17 0253  NA 131*  --  131* 129* 128* 129* 126*  K 4.2  --  4.2 4.5 4.0 4.1 4.4  CL 99*  --  100* 98* 98* 97* 94*  CO2 22  --  21* 23 23 24 22   GLUCOSE 144*  --  161* 218* 175* 202* 162*  BUN 11  --  6 9 6 8 8   CREATININE 0.49*   < > 0.51* 0.58* 0.52* 0.52* 0.51*  CALCIUM 9.0  --  8.8* 9.1 8.8* 8.8* 8.7*  MG 1.9  --   --   --   --   --  1.7   < > = values in this interval not displayed.   GFR: Estimated Creatinine Clearance: 76 mL/min (A) (by C-G formula based on SCr of 0.51 mg/dL (L)). Liver Function  Tests: No results for input(s): AST, ALT, ALKPHOS, BILITOT, PROT, ALBUMIN in the last 168 hours. No results for input(s): LIPASE, AMYLASE in the last 168 hours. No results for input(s): AMMONIA in the last 168 hours. Coagulation Profile: No results for input(s): INR, PROTIME in the last 168 hours. Cardiac Enzymes: No results for input(s): CKTOTAL, CKMB, CKMBINDEX, TROPONINI in the last 168 hours. BNP (last 3 results) No results for input(s): PROBNP in the last 8760 hours. HbA1C: Recent Labs    11/10/17 0316  HGBA1C 6.5*   CBG: Recent Labs  Lab 11/10/17 0607 11/10/17 1234 11/10/17 1648 11/10/17 2216 11/11/17 0622  GLUCAP 179* 191* 177* 149* 190*   Lipid Profile: No results for input(s): CHOL, HDL, LDLCALC, TRIG, CHOLHDL, LDLDIRECT in the last 72 hours. Thyroid Function Tests: No results for input(s): TSH, T4TOTAL, FREET4, T3FREE, THYROIDAB in the last 72 hours. Anemia Panel: No results for input(s): VITAMINB12, FOLATE, FERRITIN, TIBC, IRON, RETICCTPCT in the last 72 hours. Sepsis Labs: No results for input(s): PROCALCITON, LATICACIDVEN in the last 168 hours.  Recent Results (from the past 240 hour(s))  Body fluid culture     Status: None   Collection Time: 11/08/2017  4:33 PM  Result Value Ref Range Status   Specimen Description PERICARDIAL Operating Room Services  Final   Special Requests NONE  Final   Gram Stain   Final    FEW WBC PRESENT, PREDOMINANTLY PMN NO ORGANISMS SEEN    Culture   Final    NO GROWTH 3 DAYS Performed at [facility], [address], [place], [place] [address]    Report Status 11/09/2017 FINAL  Final         Radiology Studies: No results found.      Scheduled Meds: . amiodarone  400 mg Oral BID  . aspirin  81 mg Oral Daily  . atorvastatin  40 mg Oral q1800  . benzonatate  200 mg Oral BID  . docusate sodium  200 mg Oral BID  . feeding supplement (ENSURE ENLIVE)  237 mL Oral BID BM  . folic acid  1 mg Oral Daily  . heparin  5,000 Units  Subcutaneous Q8H  . insulin aspart  0-5 Units Subcutaneous QHS  . insulin aspart  0-9 Units Subcutaneous TID WC  . lacosamide  100 mg Oral BID  . levETIRAcetam  1,500 mg Oral BID  . metoprolol tartrate  25 mg Oral BID  . multivitamin with minerals  1 tablet Oral Daily  . phenytoin  30 mg Oral BID  . sodium chloride flush  3 mL Intravenous Q12H  . tamsulosin  0.4 mg Oral Daily  . thiamine  100 mg Oral Daily   Continuous Infusions: . sodium chloride    . sodium chloride 10 mL/hr at 11/08/17 1200     LOS: 15 days    Time spent: 30 mins    Ankit Arsenio Loader, MD [facility] Pager [phone]   If 7PM-7AM, please contact night-coverage [email or web address] Password [facility] 11/11/2017, 11:42 AM

## 2017-11-11 NOTE — Plan of Care (Signed)
  Problem: Elimination: Goal: Will not experience complications related to urinary retention Outcome: Progressing  Patient bladder scanned - 139ml residual

## 2017-11-11 NOTE — Progress Notes (Signed)
Physical Therapy Treatment Patient Details Name: Wyatt Salas MRN: [id number] DOB: Mar 18, 1951 Today's Date: 11/11/2017    History of Present Illness Pt is a 67 y.o. male with history of multiple falls, remote subdural hematoma status post right-sided craniotomy, seizure disorder, hepatitis C, hyperlipidemia, chronic diastolic congestive heart failure, alcohol abuse, lung cancer was admitted to the hospital concerning for atrial fibrillation with RVR. Pt with recent L distal humerus fracture and per orthopedics WBAT L UE with no ROM restrictions.     PT Comments    Patient is progressing very well towards their physical therapy goals. Session focused on functional strength training and posture re-education. Benefits from tactile cueing to achieve anterior pelvic tilt but not able to maintain due to fatigue. Patient requiring moderate assistance with bed mobility but only minimal assistance for sit to stand transfers. Able to take several steps with 2 person hand held assist from bed to chair. Continue to recommend SNF to maximize functional independence. Will progress activity tolerance, strengthening, posture, and mobility as tolerated.    Follow Up Recommendations  SNF     Equipment Recommendations  None recommended by PT    Recommendations for Other Services       Precautions / Restrictions Precautions Precautions: Fall Precaution Comments: Per Dr [name], ROM OK to tolerance, WBAT Required Braces or Orthoses: Other Brace/Splint Other Brace/Splint: L wrist cock-up splint Restrictions Weight Bearing Restrictions: Yes LUE Weight Bearing: Weight bearing as tolerated    Mobility  Bed Mobility Overal bed mobility: Needs Assistance Bed Mobility: Supine to Sit     Supine to sit: Mod assist     General bed mobility comments: Mod A for trunk elevation, and use of bed pad to assist with hips EOB  Transfers Overall transfer level: Needs assistance Equipment used: 2 person hand  held assist Transfers: Sit to/from [facility] Sit to Stand: Min assist;+2 physical assistance;+2 safety/equipment Stand pivot transfers: Min assist;+2 physical assistance;+2 safety/equipment       General transfer comment: R foot block due to sliding anteriorly. rocking used to gather momentum, vc for hand placement, posterior lean in standing  Ambulation/Gait                 Stairs             Wheelchair Mobility    Modified Rankin (Stroke Patients Only)       Balance Overall balance assessment: Needs assistance Sitting-balance support: Single extremity supported;Feet supported Sitting balance-Leahy Scale: Good Sitting balance - Comments: Able to perform sitting balance during LB exercises and functional grooming/dressing tasks Postural control: Posterior lean(in standing) Standing balance support: Bilateral upper extremity supported Standing balance-Leahy Scale: Poor Standing balance comment: Relies on UE support for balance                            Cognition Arousal/Alertness: Awake/alert Behavior During Therapy: WFL for tasks assessed/performed Overall Cognitive Status: Impaired/Different from baseline Area of Impairment: Problem solving;Attention                             Problem Solving: Slow processing General Comments: Pt able to follow commands well but requires cues to complete L elbow and wrist ROM exercises appropriately.       Exercises General Exercises - Upper Extremity Elbow Flexion: AAROM;Left;Seated;10 reps Elbow Extension: AAROM;Left;Seated;10 reps Digit Composite Flexion: AROM;Left;Seated;10 reps Composite Extension: AROM;Left;Seated;10 reps General Exercises -  Lower Extremity Long Arc Quad: 15 reps;Both;Seated Other Exercises Other Exercises: Scapular retractions x 5 with 3 second hold Other Exercises: 2 sit to stands with unilateral R hand support to facilitate lower extremity  strengthening/power Other Exercises: Facilitation of anterior pelvic tilt in sitting with tactile cueing Other Exercises: Facilitated opposition of L thumb to all digits. Requires assistance for IV and V digits.  Other Exercises: Facilitated thumb radial abduction x10 repetitions with difficulty isolating thumb movement from index movement.     General Comments General comments (skin integrity, edema, etc.): Upon entering room, O2 saturating between 80-88%. RN notified and pt placed on 2L O2 prior to session. O2 > 92% throughout session.      Pertinent Vitals/Pain Pain Assessment: 0-10 Pain Score: 5  Pain Location: Left Side Pain Descriptors / Indicators: Grimacing;Tender;Burning Pain Intervention(s): Limited activity within patient's tolerance;Monitored during session    Home Living                      Prior Function            PT Goals (current goals can now be found in the care plan section) Acute Rehab PT Goals Patient Stated Goal: none stated PT Goal Formulation: With patient Time For Goal Achievement: 11/25/17 Potential to Achieve Goals: Fair Progress towards PT goals: Progressing toward goals    Frequency    Min 2X/week      PT Plan Current plan remains appropriate    Co-evaluation PT/OT/SLP Co-Evaluation/Treatment: Yes Reason for Co-Treatment: For patient/therapist safety;To address functional/ADL transfers PT goals addressed during session: Mobility/safety with mobility OT goals addressed during session: ADL's and self-care;Strengthening/ROM      AM-PAC PT "6 Clicks" Daily Activity  Outcome Measure  Difficulty turning over in bed (including adjusting bedclothes, sheets and blankets)?: A Little Difficulty moving from lying on back to sitting on the side of the bed? : Unable Difficulty sitting down on and standing up from a chair with arms (e.g., wheelchair, bedside commode, etc,.)?: Unable Help needed moving to and from a bed to chair (including  a wheelchair)?: A Little Help needed walking in hospital room?: A Lot Help needed climbing 3-5 steps with a railing? : Total 6 Click Score: 11    End of Session Equipment Utilized During Treatment: Gait belt Activity Tolerance: Patient limited by fatigue Patient left: in chair;with call bell/phone within reach;with chair alarm set;with nursing/sitter in room Nurse Communication: Mobility status PT Visit Diagnosis: Muscle weakness (generalized) (M62.81);Other abnormalities of gait and mobility (R26.89);Unsteadiness on feet (R26.81);Difficulty in walking, not elsewhere classified (R26.2);History of falling (Z91.81)     Time: 4540-9811 PT Time Calculation (min) (ACUTE ONLY): 35 min  Charges:  $Therapeutic Activity: 8-22 mins                    G Codes:       Ellamae Sia, PT, DPT Acute Rehabilitation Services  Pager: [phone]    Willy Eddy 11/11/2017, 1:21 PM

## 2017-11-11 NOTE — Progress Notes (Addendum)
Progress Note  Patient Name: Wyatt Salas Date of Encounter: 11/11/2017  Primary Cardiologist: Glenetta Hew, MD   Subjective   Complains of cough  Inpatient Medications    Scheduled Meds: . amiodarone  400 mg Oral BID  . aspirin  81 mg Oral Daily  . atorvastatin  40 mg Oral q1800  . benzonatate  200 mg Oral BID  . docusate sodium  200 mg Oral BID  . feeding supplement (ENSURE ENLIVE)  237 mL Oral BID BM  . folic acid  1 mg Oral Daily  . heparin  5,000 Units Subcutaneous Q8H  . insulin aspart  0-5 Units Subcutaneous QHS  . insulin aspart  0-9 Units Subcutaneous TID WC  . lacosamide  100 mg Oral BID  . levETIRAcetam  1,500 mg Oral BID  . metoprolol tartrate  25 mg Oral BID  . multivitamin with minerals  1 tablet Oral Daily  . phenytoin  30 mg Oral BID  . sodium chloride flush  3 mL Intravenous Q12H  . tamsulosin  0.4 mg Oral Daily  . thiamine  100 mg Oral Daily   Continuous Infusions: . sodium chloride    . sodium chloride 10 mL/hr at 11/08/17 1200   PRN Meds: sodium chloride, acetaminophen, diltiazem, guaiFENesin-dextromethorphan, HYDROcodone-acetaminophen, ondansetron **OR** ondansetron (ZOFRAN) IV, phenol, polyethylene glycol, polyvinyl alcohol, sodium chloride flush   Vital Signs    Vitals:   11/11/17 0101 11/11/17 0430 11/11/17 0500 11/11/17 0817  BP: 98/72 99/70  92/66  Pulse: (!) 123 (!) 122    Resp: 15 18  17   Temp:  98.7 F (37.1 C)  98.4 F (36.9 C)  TempSrc:  Oral  Oral  SpO2:  91%  90%  Weight:   134 lb 0.6 oz (60.8 kg)   Height:        Intake/Output Summary (Last 24 hours) at 11/11/2017 0842 Last data filed at 11/11/2017 0100 Gross per 24 hour  Intake 240 ml  Output -  Net 240 ml   Filed Weights   11/09/17 0454 11/10/17 0405 11/11/17 0500  Weight: 141 lb 1.5 oz (64 kg) 135 lb (61.2 kg) 134 lb 0.6 oz (60.8 kg)    Telemetry    Atrial flutter with RVR- Personally Reviewed  ECG    No new EKG to review - Personally  Reviewed  Physical Exam   GEN: No acute distress.  Cachectic appearing Neck: No JVD Cardiac:  Tachycardic, no murmurs, rubs, or gallops.  Respiratory: Scattered rhonchi anteriorly GI: Soft, nontender, non-distended  MS: No edema; No deformity. Neuro:  Nonfocal  Psych: Normal affect   Labs    Chemistry Recent Labs  Lab 11/08/17 0219 11/09/17 0404 11/11/17 0253  NA 128* 129* 126*  K 4.0 4.1 4.4  CL 98* 97* 94*  CO2 23 24 22   GLUCOSE 175* 202* 162*  BUN 6 8 8   CREATININE 0.52* 0.52* 0.51*  CALCIUM 8.8* 8.8* 8.7*  GFRNONAA >60 >60 >60  GFRAA >60 >60 >60  ANIONGAP 7 8 10      Hematology Recent Labs  Lab 11/07/17 0848 11/09/17 0404 11/11/17 0437  WBC 10.3 11.7* 11.8*  RBC 4.37 4.04* 3.97*  HGB 11.0* 10.2* 10.0*  HCT 35.5* 32.5* 31.2*  MCV 81.2 80.4 78.6  MCH 25.2* 25.2* 25.2*  MCHC 31.0 31.4 32.1  RDW 15.1 15.0 14.6  PLT 497* 587* 553*    Cardiac EnzymesNo results for input(s): TROPONINI in the last 168 hours. No results for input(s): TROPIPOC in the last  168 hours.   BNPNo results for input(s): BNP, PROBNP in the last 168 hours.   DDimer No results for input(s): DDIMER in the last 168 hours.   Radiology    No results found.  Cardiac Studies   2D echo 11/03/2017 Study Conclusions  - Left ventricle: The cavity size was normal. Wall thickness was normal. Systolic function was mildly to moderately reduced. The estimated ejection fraction was in the range of 40% to 45%. Diffuse hypokinesis. Features are consistent with a pseudonormal left ventricular filling pattern, with concomitant abnormal relaxation and increased filling pressure (grade 2 diastolic dysfunction). - Mitral valve: There was mild regurgitation. - Tricuspid valve: There was trivial regurgitation. - Pericardium, extracardiac: A large pericardial effusion (up to 3cm) was identified circumferential to the heart. The fluid had no internal echoes. No RA or RV collapse. IVC  is small in diameter but does not demonstrate respiratory variation. There was no significant evidence of hemodynamic compromise.  Impressions:  - Pericardial effusion is new when compared to prior.  2D echo limited 4/20 Study Conclusions  - Left ventricle: The cavity size was mildly dilated. The estimated ejection fraction was 40%. Diffuse hypokinesis. - Mitral valve: There was mild regurgitation. - Pericardium, extracardiac: Post pericardiocentesis much improved with trivial residual effusion   Patient Profile     67 y.o. male  with a hx of high fall risk with multiple falls in the past 2 months with multiple fractures, wheel chair dependent, remote hx of subdural hematoma s/p right sided craniotomy (?2000), seizure disorder, alcohol abuse, chronic hep C, HTN, HDL, chronic diastolic heart failure, and lung mass concerning   Assessment & Plan    1. Atrial flutter with RVR -HR still elevateddespite IV amiodarone drip and repeat boluses.  Heart rate had no effect with calcium channel blockers or digoxin -Changed to amiodarone 400 mg twice daily yesterday and added Lopressor 25 mg twice daily without any significant effect on heart rate -BP too soft to titrate Lopressor up -currently systolic blood pressure 95 mmHg -CHADS2VASC score is elevated at 70 (age >77, HTN, DM, CHF) but not a candidate for anticoagulation due to history of multiple falls resulting in fractures as well as h/o subdural hematoma and bloody pericardial effusion.   2. Large pericardial effusion with tamponade s/p pericardiocentesis  - 750cc bloody fluid removed. 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  There was a trivial pericardial effusion identified.  3. Chronic combined systolic/diastolic CHF - 2D echo this admit showed EF 40-45% with G2DD -  possibly tachy mediated from rapid atrial flutter -Not been started on ACE/ARB due to soft blood pressure and need for beta-blocker therapy to control heart rate -Continue Lopressor -not a cath candidate at this point due to likely metastatic lung CA and inability to use antiplatelet agents if he needed PCI. -I&O's incomplete -Weight down 1 pound from yesterday.  His admission weight was 121 pounds and he is 134 pounds today. -Creatinine is stable at 0.51 -Check BNP to help guide medical therapy with diuretics  4. Non small cell lung CAlikely stage IV with mets to pericardium with malignant effusion -Patient seen by oncology yesterday commended palliative care to the patient. -Per oncology notes patient is interested in treatment but would like to receive his treatment in [place] to be close to his family  5.Hyponatremia -Agree with TRH that this is likely secondary to SIADH and  not CHF -He is on fluid restriction  We do not have many options for treating his rapid atrial flutter which is likely being driven by his underlying malignancy.  Given his overall debilitated state and metastatic lung CA recommend palliative care.    For questions or updates, please contact [place] Please consult [email or web address] for contact info under Cardiology/STEMI.      Signed, Fransico Him, MD  11/11/2017, 8:42 AM

## 2017-11-12 LAB — BASIC METABOLIC PANEL
Anion gap: 14 (ref 5–15)
BUN: 8 mg/dL (ref 6–20)
CHLORIDE: 92 mmol/L — AB (ref 101–111)
CO2: 20 mmol/L — AB (ref 22–32)
Calcium: 8.8 mg/dL — ABNORMAL LOW (ref 8.9–10.3)
Creatinine, Ser: 0.56 mg/dL — ABNORMAL LOW (ref 0.61–1.24)
GFR calc non Af Amer: >60 mL/min (ref >60)
Glucose, Bld: 166 mg/dL — ABNORMAL HIGH (ref 65–99)
POTASSIUM: 4.6 mmol/L (ref 3.5–5.1)
SODIUM: 126 mmol/L — AB (ref 135–145)

## 2017-11-12 LAB — GLUCOSE, CAPILLARY
GLUCOSE-CAPILLARY: 143 mg/dL — AB (ref 65–99)
Glucose-Capillary: 147 mg/dL — ABNORMAL HIGH (ref 65–99)
Glucose-Capillary: 110 mg/dL — ABNORMAL HIGH (ref 65–99)
Glucose-Capillary: 136 mg/dL — ABNORMAL HIGH (ref 65–99)

## 2017-11-12 LAB — CBC
HCT: 33 % — ABNORMAL LOW (ref 39.0–52.0)
Hemoglobin: 10.7 g/dL — ABNORMAL LOW (ref 13.0–17.0)
MCH: 25.4 pg — AB (ref 26.0–34.0)
MCHC: 32.4 g/dL (ref 30.0–36.0)
MCV: 78.4 fL (ref 78.0–100.0)
PLATELETS: 577 10*3/uL — AB (ref 150–400)
RBC: 4.21 MIL/uL — ABNORMAL LOW (ref 4.22–5.81)
RDW: 15 % (ref 11.5–15.5)
WBC: 13 10*3/uL — AB (ref 4.0–10.5)

## 2017-11-12 LAB — MAGNESIUM: MAGNESIUM: 1.8 mg/dL (ref 1.7–2.4)

## 2017-11-12 NOTE — Progress Notes (Signed)
Clinical Social Worker following patient for support and discharge needs. Patient at this time is not medically stable to transition to a rehab facility. CSW will continue to follow patient until medically cleared by MD.   Wyatt Salas, MSW,  [place]

## 2017-11-12 NOTE — Progress Notes (Signed)
Progress Note  Patient Name: Wyatt Salas Date of Encounter: 11/12/2017  Primary Cardiologist: Glenetta Hew, MD   Subjective   He denies any chest pain or SOB  Inpatient Medications    Scheduled Meds: . amiodarone  400 mg Oral BID  . aspirin  81 mg Oral Daily  . atorvastatin  40 mg Oral q1800  . benzonatate  200 mg Oral BID  . docusate sodium  200 mg Oral BID  . feeding supplement (ENSURE ENLIVE)  237 mL Oral BID BM  . folic acid  1 mg Oral Daily  . heparin  5,000 Units Subcutaneous Q8H  . insulin aspart  0-5 Units Subcutaneous QHS  . insulin aspart  0-9 Units Subcutaneous TID WC  . lacosamide  100 mg Oral BID  . levETIRAcetam  1,500 mg Oral BID  . metoprolol tartrate  25 mg Oral BID  . multivitamin with minerals  1 tablet Oral Daily  . phenytoin  30 mg Oral BID  . sodium chloride flush  3 mL Intravenous Q12H  . tamsulosin  0.4 mg Oral Daily  . thiamine  100 mg Oral Daily   Continuous Infusions: . sodium chloride Stopped (11/11/17 2300)   PRN Meds: sodium chloride, acetaminophen, diltiazem, guaiFENesin-dextromethorphan, HYDROcodone-acetaminophen, ondansetron **OR** ondansetron (ZOFRAN) IV, phenol, polyethylene glycol, polyvinyl alcohol, sodium chloride flush   Vital Signs    Vitals:   11/11/17 2351 11/12/17 0315 11/12/17 0320 11/12/17 0746  BP: 92/70  (!) 89/67 96/63  Pulse: (!) 125   (!) 115  Resp: (!) 23 (!) 22 (!) 22 (!) 23  Temp: 98.4 F (36.9 C) 98.4 F (36.9 C)  98.5 F (36.9 C)  TempSrc: Oral Oral  Oral  SpO2: 92% 90% 94% 99%  Weight:   133 lb 6.1 oz (60.5 kg)   Height:   6\' 1"  (1.854 m)     Intake/Output Summary (Last 24 hours) at 11/12/2017 0751 Last data filed at 11/12/2017 0400 Gross per 24 hour  Intake 363 ml  Output 900 ml  Net -537 ml   Filed Weights   11/10/17 0405 11/11/17 0500 11/12/17 0320  Weight: 135 lb (61.2 kg) 134 lb 0.6 oz (60.8 kg) 133 lb 6.1 oz (60.5 kg)    Telemetry    Atrial flutter with RVR at 115bpm -  Personally Reviewed  ECG    No new EKG to review - Personally Reviewed  Physical Exam   GEN: No acute distress.   Neck: No JVD Cardiac: tachycardic, no murmurs, rubs, or gallops.  Respiratory: Clear to auscultation bilaterally. GI: Soft, nontender, non-distended  MS: No edema; No deformity. Neuro:  Nonfocal  Psych: Normal affect   Labs    Chemistry Recent Labs  Lab 11/09/17 0404 11/11/17 0253 11/12/17 0353  NA 129* 126* 126*  K 4.1 4.4 4.6  CL 97* 94* 92*  CO2 24 22 20*  GLUCOSE 202* 162* 166*  BUN 8 8 8   CREATININE 0.52* 0.51* 0.56*  CALCIUM 8.8* 8.7* 8.8*  GFRNONAA >60 >60 >60  GFRAA >60 >60 >60  ANIONGAP 8 10 14      Hematology Recent Labs  Lab 11/09/17 0404 11/11/17 0437 11/12/17 0353  WBC 11.7* 11.8* 13.0*  RBC 4.04* 3.97* 4.21*  HGB 10.2* 10.0* 10.7*  HCT 32.5* 31.2* 33.0*  MCV 80.4 78.6 78.4  MCH 25.2* 25.2* 25.4*  MCHC 31.4 32.1 32.4  RDW 15.0 14.6 15.0  PLT 587* 553* 577*    Cardiac EnzymesNo results for input(s): TROPONINI in the last  168 hours. No results for input(s): TROPIPOC in the last 168 hours.   BNP Recent Labs  Lab 11/11/17 0931  BNP 316.0*     DDimer No results for input(s): DDIMER in the last 168 hours.   Radiology    No results found.  Cardiac Studies   2D echo 11/03/2017 Study Conclusions  - Left ventricle: The cavity size was normal. Wall thickness was normal. Systolic function was mildly to moderately reduced. The estimated ejection fraction was in the range of 40% to 45%. Diffuse hypokinesis. Features are consistent with a pseudonormal left ventricular filling pattern, with concomitant abnormal relaxation and increased filling pressure (grade 2 diastolic dysfunction). - Mitral valve: There was mild regurgitation. - Tricuspid valve: There was trivial regurgitation. - Pericardium, extracardiac: A large pericardial effusion (up to 3cm) was identified circumferential to the heart. The fluid  had no internal echoes. No RA or RV collapse. IVC is small in diameter but does not demonstrate respiratory variation. There was no significant evidence of hemodynamic compromise.  Impressions:  - Pericardial effusion is new when compared to prior.  2D echo limited 4/20 Study Conclusions  - Left ventricle: The cavity size was mildly dilated. The estimated ejection fraction was 40%. Diffuse hypokinesis. - Mitral valve: There was mild regurgitation. - Pericardium, extracardiac: Post pericardiocentesis much improved with trivial residual effusion   Patient Profile     67 y.o. male with a hx of high fall risk with multiple falls in the past 2 months with multiple fractures, wheel chair dependent, remote hx of subdural hematoma s/p right sided craniotomy (?2000), seizure disorder, alcohol abuse, chronic hep C, HTN, HDL, chronic diastolic heart failure, and lung mass concerning   Assessment & Plan    1. Atrial flutter with RVR -HR still elevateddespite IV amiodarone dripand repeat boluses.  Heart rate had no effect with calcium channel blockers or digoxin -Changed to amiodarone 400 mg twice daily  and added Lopressor 25 mg twice daily  -Heart rate is actually improved and is around 110 to 115 bpm now -BP too soft to titrate Lopressor up -currently systolic blood pressure 95/62 mmHg -CHADS2VASC score is elevated at 4 (age >15, HTN, DM, CHF) but not a candidate for anticoagulation due to history of multiple falls resulting in fractures as well as h/o subdural hematoma and bloody pericardial effusion.   2. Large pericardial effusion with tamponade s/p pericardiocentesis  - 750cc bloody fluid removed. 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 There was a trivial pericardial effusion identified.  3. Chronic combined  systolic/diastolic CHF - 2D echo this admit showed EF 40-45% with G2DD - possibly tachy mediated from rapid atrial flutter -Not been started on ACE/ARB due to soft blood pressure and need for beta-blocker therapy to control heart rate -Continue Lopressor 25 mg twice daily -not a cath candidate due to metastatic lung CA -Put out 900 cc urine yesterday and is net -2.6 L -Weight is still up 12 pounds -Creatinine is stable at 0.56 -BNP was mildly elevated yesterday 316 but likely due to underlying metastatic disease with malignant effusions  4. 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    Blood pressure is too soft to titrate medicines further given his overall debilitated state and metastatic lung CA recommend palliative care.   For questions or updates, please contact [place] Please consult [email or web address] for contact info under Cardiology/STEMI.      Signed, Fransico Him, MD  11/12/2017, 7:51 AM

## 2017-11-12 NOTE — Progress Notes (Signed)
Patient Demographics:    Wyatt Salas, is a 67 y.o. male, DOB - December 21, 1950, DUK:[id number]  Admit date - 10/30/2017   Admitting Physician Vianne Bulls, MD  Outpatient Primary MD for the patient is [name], MD  LOS - 69   Chief Complaint  Patient presents with  . Fall  . Shoulder Pain  . Headache        Subjective:    Wyatt Salas today has no fevers, no emesis,  No chest pain, no dizziness or palpitations  Assessment  & Plan :    Principal Problem:   Atrial fibrillation with rapid ventricular response (HCC) Active Problems:   Seizure disorder (HCC)   Type 2 diabetes mellitus with hyperlipidemia (HCC)   Lung mass   Essential hypertension   Acute urinary retention   Chronic diastolic CHF (congestive heart failure) (HCC)   Closed fracture of distal end of left humerus   Protein-calorie malnutrition, severe   Acute combined systolic and diastolic heart failure (HCC)   Cardiac/pericardial tamponade   Goals of care, counseling/discussion   Palliative care by specialist   Atrial flutter ([place])   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  Upon admission on the chest x-ray there was concern of right upper lobe posterior mass like lesion.  His atrial fibrillation was treated with Cardizem and Lopressor.  He also developed pleural effusion during his hospital course and underwent thoracentesis on October 29, 2017.  Pleural fluid suggested of few malignant cells and then later suggested he had poorly differentiated non-small cell carcinoma.  He is also developed  pericardial effusion which is noted to be malignant and about 750 cc of this was drained.  His atrial fibrillation has remained difficult to control therefore currently is on amiodarone and Lopressor.  Cardiology is following but difficult to titrate this given his soft blood pressure.  Patient is also been seen by palliative care, patient plans to move to [place] once he can establish care with a PCP at [place]:- 1)Atrial flutter with rapid ventricular response rate; CHADScVASC 4- (age >39, HTN, DM, CHF) Patient with history of recurrent falls and prior subdural hematoma, also has history of pericardial effusion, not a good candidate for anticoagulation, -Currently heart rate mostly in the 110s, cardiology status change amiodarone to 400 mg twice daily, along with Lopressor 25 mg twice daily added 11/10/17.  No room to uptitrate today as her blood pressure has soft.  Rate control did not improve with with calcium channel blockers and did not improve with digoxin either.    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  Repeat echocardiogram showed questionable effusion.  Will need to closely monitor this for any signs of decompensation. -She would prefer to follow-up in [place] at the [facility] because that is where he is from. - I do not think patient is strong enough at this  time running chemoradiation.  Palliative care team is following.  3)H/o Essential hypertension- -Continue Lopressor for rate control, BP has been soft lately  Chronic combined systolic and diastolic congestive heart failure, class II Coronary artery disease -Ejection fraction 40-45% with grade 2 diastolic dysfunction and diffuse hypokinesis -Closely monitor his volume status. -Continue aspirin, statin, beta-blocker.  Not on ACE inhibitor due to concerns  of low blood pressure.  Left humerus fracture -Secondary to fall, follow-up outpatient with Ortho  Diabetes mellitus type 2 -Hemoglobin A1c- 6.5 Continue Lantus and sliding scale  History of seizure disorder -Continue Keppra and Dilantin  Urinary retention, Foley is in place Likely has underlying BPH -Continue Flomax.  Plans to remove Foley today and watch him for any signs of urinary retention  History of alcohol abuse -No longer at risk of withdrawal as he has been here for several days. -Continue thiamine 100 mg daily, multivitamin 1 tab daily  Osteoarthritis -Symptomatic management  Hyponatremia -Consistent with SIADH  Generalized weakness and deconditioning Moderate  protein calorie malnutrition -Physical therapy recommends skilled nursing facility.  At this time holding off on transitioning him to skilled nursing facility given his uncontrolled heart rate.  Disposition- We do not have many options for treating his rapid atrial flutter which is likely being driven by his underlying malignancy.   Blood pressure is too soft to titrate medicines further given his overall debilitated state and metastatic lung CA recommend palliative care.    DVT prophylaxis: Lovenox Code Status: Full code  Family Communication:  none at bedside Disposition Plan: Maintain inpatient stay for better heart rate control.  Cardiology is following.  Once this is better to begin transitioning to skilled nursing facility.  Consultants:   Cardiology/palliative  Procedures:   Thoracentesis 4/15  Bronchoscopy 4/16   DVT Prophylaxis  :   Heparin -    Lab Results  Component Value Date   PLT 577 (H) 11/12/2017    Inpatient Medications  Scheduled Meds: . amiodarone  400 mg Oral BID  . aspirin  81 mg Oral Daily  . atorvastatin  40 mg Oral q1800  . benzonatate  200 mg Oral BID  . docusate sodium  200 mg Oral BID  . feeding supplement (ENSURE ENLIVE)  237 mL Oral BID BM  .  folic acid  1 mg Oral Daily  . heparin  5,000 Units Subcutaneous Q8H  . insulin aspart  0-5 Units Subcutaneous QHS  . insulin aspart  0-9 Units Subcutaneous TID WC  . lacosamide  100 mg Oral BID  . levETIRAcetam  1,500 mg Oral BID  . metoprolol tartrate  25 mg Oral BID  . multivitamin with minerals  1 tablet Oral Daily  . phenytoin  30 mg Oral BID  . sodium chloride flush  3 mL Intravenous Q12H  . tamsulosin  0.4 mg Oral Daily  . thiamine  100 mg Oral Daily   Continuous Infusions: . sodium chloride Stopped (11/11/17 2300)   PRN Meds:.sodium chloride, acetaminophen, diltiazem, guaiFENesin-dextromethorphan, HYDROcodone-acetaminophen, ondansetron **OR** ondansetron (ZOFRAN) IV, phenol, polyethylene glycol, polyvinyl alcohol, sodium chloride flush    Anti-infectives (From admission, onward)   None        Objective:   Vitals:   11/12/17 0746 11/12/17 1216 11/12/17 1600 11/12/17 1800  BP: 96/63 100/81 98/73   Pulse: (!) 115 (!) 121  (!) 115  Resp: (!) 23 (!) 25    Temp: 98.5 F (36.9 C) 98.4 F (36.9 C)    TempSrc: Oral Oral  SpO2: 99% 94%    Weight:      Height:        Wt Readings from Last 3 Encounters:  11/12/17 60.5 kg (133 lb 6.1 oz)  09/20/17 68 kg (150 lb)  09/13/17 65.8 kg (145 lb)     Intake/Output Summary (Last 24 hours) at 11/12/2017 1903 Last data filed at 11/12/2017 0400 Gross per 24 hour  Intake 123 ml  Output -  Net 123 ml    Physical Exam  Gen:- Awake Alert,  In no apparent distress  HEENT:- [place].AT, No sclera icterus Neck-Supple Neck,No JVD,.  Lungs-  CTAB , fair movement CV- S1, S2 normal, tachycardic and irregular Abd-  +ve B.Sounds, Abd Soft, No tenderness,    Extremity/Skin:-Negative Homans Psych-affect is appropriate, oriented x3 Neuro-no new focal deficits, no tremors   Data Review:   Micro Results Recent Results (from the past 240 hour(s))  Body fluid culture     Status: None   Collection Time: 11/06/2017  4:33 PM  Result Value  Ref Range Status   Specimen Description PERICARDIAL [facility]  Final   Special Requests NONE  Final   Gram Stain   Final    FEW WBC PRESENT, PREDOMINANTLY PMN NO ORGANISMS SEEN    Culture   Final    NO GROWTH 3 DAYS Performed at [facility], [address], [place], [place] [address]    Report Status 11/09/2017 FINAL  Final    Radiology Reports Ct Abdomen Pelvis Wo Contrast  Result Date: 11/09/2017 CLINICAL DATA:  Left shoulder and forehead pain after fall this morning. Lung mass. EXAM: CT CHEST, ABDOMEN, AND PELVIS WITHOUT CONTRAST TECHNIQUE: Multidetector CT imaging of the chest, abdomen and pelvis was performed without IV contrast. CONTRAST:  The patient's IV infiltrated during imaging and the order was changed to CT of the chest, abdomen and pelvis without per ordering clinician. COMPARISON:  PET-CT 05/31/2017 FINDINGS: CT CHEST FINDINGS Cardiovascular: Top-normal size heart with left main and three-vessel coronary arteriosclerosis. Aortic atherosclerosis without aneurysm. The unenhanced pulmonary vasculature is difficult to assess beyond the main pulmonary artery due to soft tissue masses and adenopathy about the right hilum and mediastinum. Mediastinum/Nodes: Interval increase in masslike abnormalities in the mediastinum and right hilum compatible with progression of metastatic adenopathy and/or increase and extension of known right upper lobe mass. Largest lymph node identified is subcarinal at 2.8 cm. Interval increase in right upper paratracheal lymph node measuring 1.1 cm. Exact margins of additional lymph node masses are difficult to delineate on this unenhanced study. There is luminal narrowing of the distal right mainstem bronchus without occlusion secondary to the right hilar, mediastinal and subcarinal masslike abnormalities. The thyroid gland is unremarkable. The esophagus is not well visualized. Lungs/Pleura: Partially loculated small to moderate right effusion. New pulmonary  nodular masslike opacities are noted within the right upper, middle and lower lobes with interval progression in size of spiculated right upper lobe mass now measuring approximately 5.8 x 5.3 x 5 cm. Within the superior segment of the aerated right lower lobe are metastatic nodules measuring up to 2.2 cm. Additional nodules are seen in the right middle and anterior right lower lobe. Left lung remains clear. Nodular thickening along the left major fissure is seen which may represent fluid in the fissure versus an intra fissural mass. This measures 2.2 x 1.2 x 1.7 cm. Peribronchial thickening is noted within both lower lobes. Musculoskeletal: Remote right posterior seventh through ninth rib fractures. No suspicious osseous lesions. Extravasated  contrast noted in the soft tissues of the right arm. CT ABDOMEN PELVIS FINDINGS Hepatobiliary: No biliary dilatation. The unenhanced liver is unremarkable. The gallbladder is physiologically distended. Pancreas: No ductal dilatation or inflammation. Spleen: No splenomegaly or mass. Adrenals/Urinary Tract: No adrenal mass. Interpolar 2.5 cm simple right renal cyst. Unremarkable noncontrast appearance of the left kidney. No obstructive uropathy. Marked bladder distention to the umbilicus with the bladder measuring 14.7 x 14.1 x 18.8 cm (volume = 2040 cm^3). Stomach/Bowel: Stomach is within normal limits. Appendix appears normal. No evidence of bowel wall thickening, distention, or inflammatory changes. Vascular/Lymphatic: Moderate aortoiliac and branch vessel atherosclerosis. No aneurysm. No adenopathy. Reproductive: Normal size prostate. Other: No free air nor free fluid. Musculoskeletal: Degenerative disc disease L5-S1. No aggressive osseous lesions. IMPRESSION: Extravasation of IV contrast into the patient's right arm. Study was flagged for contrast extravasation radiology PA follow-up should the patient be discharged. Chest CT: 1. Interval increase in spiculated right upper  lobe mass now measuring approximately 5.8 x 5.3 x 5 cm with progression of metastatic mediastinal and hilar adenopathy as well as development of satellite pulmonary nodules in the right lung. Slight luminal narrowing of the distal right mainstem bronchus due to adenopathy. No occlusion noted however. 2. Ovoid density in the left major fissure may represent a pseudo lesion with fluid in the fissure versus an intra fissural mass measuring 2.2 x 1.2 x 1.7 cm. 3. Loculated moderate right pleural effusion. Abdomen and pelvic CT: 1. Marked urinary distention with volume of approximately 2 L. 2. Simple right interpolar renal cyst measuring 2.5 cm. 3. No apparent evidence of metastatic disease within the abdomen and pelvis given limitations of a noncontrast exam. Electronically Signed   By: Ashley Royalty M.D.   On: 10/21/2017 22:55   Dg Chest 2 View  Result Date: 10/18/2017 CLINICAL DATA:  Cough, history of lung cancer. EXAM: CHEST - 2 VIEW COMPARISON:  05/18/2017 chest CT, CXR 05/18/2017 FINDINGS: Interval increase in size of masslike opacity in the right upper lobe, best appreciated on the sagittal view measuring approximately 4 x 3.5 x 4.8 cm with right paratracheal and perihilar soft tissue prominence concerning for progression of lymphadenopathy. Superimposed adjacent pneumonia is difficult to entirely exclude. Hyperinflated left lung without pulmonary consolidation or dominant mass. Heart size is top-normal. No aortic aneurysm. No suspicious osseous lesions. IMPRESSION: 1. Interval increase in size of right upper lobe posterior masslike abnormality now estimated at 4 x 3.5 x 4.8 cm, previously estimated at approximately 3.6 cm. 2. Interval increase in soft tissue prominence about the mediastinum and right hilum compatible with metastatic lymphadenopathy. Superimposed adjacent airspace opacities cannot exclude pneumonia and/or areas of atelectasis. Electronically Signed   By: Ashley Royalty M.D.   On: 11/15/2017 19:37    Dg Elbow Complete Left (3+view)  Result Date: 10/29/2017 CLINICAL DATA:  Elbow fracture. EXAM: LEFT ELBOW - COMPLETE 3+ VIEW; LEFT FOREARM - 2 VIEW COMPARISON:  Left elbow and forearm x-rays dated September 20, 2017. FINDINGS: Left elbow: Unchanged mild cortical step-off along the medial aspect of the capitellum. No new fracture. Persistent elbow joint effusion. Osteopenia. Soft tissues are unremarkable. Left forearm: No radius or ulna fracture. No focal bone lesion. Osteopenia. Soft tissues are unremarkable. IMPRESSION: 1. Unchanged minimally displaced fracture of the capitellum. Persistent elbow joint effusion. 2. No acute abnormality of the forearm. Electronically Signed   By: Titus Dubin M.D.   On: 10/29/2017 13:17   Dg Forearm Left  Result Date: 10/29/2017 CLINICAL DATA:  Elbow  fracture. EXAM: LEFT ELBOW - COMPLETE 3+ VIEW; LEFT FOREARM - 2 VIEW COMPARISON:  Left elbow and forearm x-rays dated September 20, 2017. FINDINGS: Left elbow: Unchanged mild cortical step-off along the medial aspect of the capitellum. No new fracture. Persistent elbow joint effusion. Osteopenia. Soft tissues are unremarkable. Left forearm: No radius or ulna fracture. No focal bone lesion. Osteopenia. Soft tissues are unremarkable. IMPRESSION: 1. Unchanged minimally displaced fracture of the capitellum. Persistent elbow joint effusion. 2. No acute abnormality of the forearm. Electronically Signed   By: Titus Dubin M.D.   On: 10/29/2017 13:17   Ct Head Wo Contrast  Result Date: 11/14/2017 CLINICAL DATA:  [place] out of wheelchair today. Left forehead and left shoulder pain. Initial encounter. EXAM: CT HEAD WITHOUT CONTRAST CT CERVICAL SPINE WITHOUT CONTRAST TECHNIQUE: Multidetector CT imaging of the head and cervical spine was performed following the standard protocol without intravenous contrast. Multiplanar CT image reconstructions of the cervical spine were also generated. COMPARISON:  09/21/2017 FINDINGS: CT HEAD FINDINGS  Brain: Right MCA territory encephalomalacia is unchanged as is more extensive right temporal lobe encephalomalacia which may be in part postsurgical. There is marked cerebral scratched of there is marked cerebellar atrophy. Periventricular white matter hypodensities are unchanged and nonspecific but compatible with mild chronic small vessel ischemic disease. No acute large territory infarct, intracranial hemorrhage, mass, midline shift, or extra-axial fluid collection is identified. Vascular: Calcified atherosclerosis at the skull base. No hyperdense vessel. Skull: Prior right pterional craniotomy.  No acute fracture. Sinuses/Orbits: The visualized paranasal sinuses and mastoid air cells are clear. The orbits are unremarkable. Other: None. CT CERVICAL SPINE FINDINGS Alignment: Chronic reversal of the normal cervical lordosis. Unchanged grade 1 anterolisthesis of C2 on C3 and C3 on C4. Skull base and vertebrae: No acute fracture or suspicious osseous lesion. Soft tissues and spinal canal: No prevertebral fluid or swelling. No visible canal hematoma. Disc levels: Moderate diffuse cervical disc degeneration and moderate to severe left greater than right mid upper cervical facet arthrosis resulting in advanced multilevel neural foraminal stenosis. Upper chest: Right pleural effusion. Unchanged 3 mm left apical lung nodule. Other: Moderate calcified atherosclerosis at the carotid bifurcations. IMPRESSION: 1. No evidence of acute intracranial abnormality. 2. Right cerebral hemisphere encephalomalacia. Severe cerebellar atrophy. 3. No acute cervical spine fracture. Electronically Signed   By: Logan Bores M.D.   On: 11/04/2017 19:51   Ct Chest Wo Contrast  Result Date: 11/08/2017 CLINICAL DATA:  Left shoulder and forehead pain after fall this morning. Lung mass. EXAM: CT CHEST, ABDOMEN, AND PELVIS WITHOUT CONTRAST TECHNIQUE: Multidetector CT imaging of the chest, abdomen and pelvis was performed without IV contrast.  CONTRAST:  The patient's IV infiltrated during imaging and the order was changed to CT of the chest, abdomen and pelvis without per ordering clinician. COMPARISON:  PET-CT 05/31/2017 FINDINGS: CT CHEST FINDINGS Cardiovascular: Top-normal size heart with left main and three-vessel coronary arteriosclerosis. Aortic atherosclerosis without aneurysm. The unenhanced pulmonary vasculature is difficult to assess beyond the main pulmonary artery due to soft tissue masses and adenopathy about the right hilum and mediastinum. Mediastinum/Nodes: Interval increase in masslike abnormalities in the mediastinum and right hilum compatible with progression of metastatic adenopathy and/or increase and extension of known right upper lobe mass. Largest lymph node identified is subcarinal at 2.8 cm. Interval increase in right upper paratracheal lymph node measuring 1.1 cm. Exact margins of additional lymph node masses are difficult to delineate on this unenhanced study. There is luminal narrowing of the distal right  mainstem bronchus without occlusion secondary to the right hilar, mediastinal and subcarinal masslike abnormalities. The thyroid gland is unremarkable. The esophagus is not well visualized. Lungs/Pleura: Partially loculated small to moderate right effusion. New pulmonary nodular masslike opacities are noted within the right upper, middle and lower lobes with interval progression in size of spiculated right upper lobe mass now measuring approximately 5.8 x 5.3 x 5 cm. Within the superior segment of the aerated right lower lobe are metastatic nodules measuring up to 2.2 cm. Additional nodules are seen in the right middle and anterior right lower lobe. Left lung remains clear. Nodular thickening along the left major fissure is seen which may represent fluid in the fissure versus an intra fissural mass. This measures 2.2 x 1.2 x 1.7 cm. Peribronchial thickening is noted within both lower lobes. Musculoskeletal: Remote right  posterior seventh through ninth rib fractures. No suspicious osseous lesions. Extravasated contrast noted in the soft tissues of the right arm. CT ABDOMEN PELVIS FINDINGS Hepatobiliary: No biliary dilatation. The unenhanced liver is unremarkable. The gallbladder is physiologically distended. Pancreas: No ductal dilatation or inflammation. Spleen: No splenomegaly or mass. Adrenals/Urinary Tract: No adrenal mass. Interpolar 2.5 cm simple right renal cyst. Unremarkable noncontrast appearance of the left kidney. No obstructive uropathy. Marked bladder distention to the umbilicus with the bladder measuring 14.7 x 14.1 x 18.8 cm (volume = 2040 cm^3). Stomach/Bowel: Stomach is within normal limits. Appendix appears normal. No evidence of bowel wall thickening, distention, or inflammatory changes. Vascular/Lymphatic: Moderate aortoiliac and branch vessel atherosclerosis. No aneurysm. No adenopathy. Reproductive: Normal size prostate. Other: No free air nor free fluid. Musculoskeletal: Degenerative disc disease L5-S1. No aggressive osseous lesions. IMPRESSION: Extravasation of IV contrast into the patient's right arm. Study was flagged for contrast extravasation radiology PA follow-up should the patient be discharged. Chest CT: 1. Interval increase in spiculated right upper lobe mass now measuring approximately 5.8 x 5.3 x 5 cm with progression of metastatic mediastinal and hilar adenopathy as well as development of satellite pulmonary nodules in the right lung. Slight luminal narrowing of the distal right mainstem bronchus due to adenopathy. No occlusion noted however. 2. Ovoid density in the left major fissure may represent a pseudo lesion with fluid in the fissure versus an intra fissural mass measuring 2.2 x 1.2 x 1.7 cm. 3. Loculated moderate right pleural effusion. Abdomen and pelvic CT: 1. Marked urinary distention with volume of approximately 2 L. 2. Simple right interpolar renal cyst measuring 2.5 cm. 3. No apparent  evidence of metastatic disease within the abdomen and pelvis given limitations of a noncontrast exam. Electronically Signed   By: Ashley Royalty M.D.   On: 11/13/2017 22:55   Ct Cervical Spine Wo Contrast  Result Date: 11/08/2017 CLINICAL DATA:  [place] out of wheelchair today. Left forehead and left shoulder pain. Initial encounter. EXAM: CT HEAD WITHOUT CONTRAST CT CERVICAL SPINE WITHOUT CONTRAST TECHNIQUE: Multidetector CT imaging of the head and cervical spine was performed following the standard protocol without intravenous contrast. Multiplanar CT image reconstructions of the cervical spine were also generated. COMPARISON:  09/21/2017 FINDINGS: CT HEAD FINDINGS Brain: Right MCA territory encephalomalacia is unchanged as is more extensive right temporal lobe encephalomalacia which may be in part postsurgical. There is marked cerebral scratched of there is marked cerebellar atrophy. Periventricular white matter hypodensities are unchanged and nonspecific but compatible with mild chronic small vessel ischemic disease. No acute large territory infarct, intracranial hemorrhage, mass, midline shift, or extra-axial fluid collection is identified. Vascular: Calcified atherosclerosis  at the skull base. No hyperdense vessel. Skull: Prior right pterional craniotomy.  No acute fracture. Sinuses/Orbits: The visualized paranasal sinuses and mastoid air cells are clear. The orbits are unremarkable. Other: None. CT CERVICAL SPINE FINDINGS Alignment: Chronic reversal of the normal cervical lordosis. Unchanged grade 1 anterolisthesis of C2 on C3 and C3 on C4. Skull base and vertebrae: No acute fracture or suspicious osseous lesion. Soft tissues and spinal canal: No prevertebral fluid or swelling. No visible canal hematoma. Disc levels: Moderate diffuse cervical disc degeneration and moderate to severe left greater than right mid upper cervical facet arthrosis resulting in advanced multilevel neural foraminal stenosis. Upper chest:  Right pleural effusion. Unchanged 3 mm left apical lung nodule. Other: Moderate calcified atherosclerosis at the carotid bifurcations. IMPRESSION: 1. No evidence of acute intracranial abnormality. 2. Right cerebral hemisphere encephalomalacia. Severe cerebellar atrophy. 3. No acute cervical spine fracture. Electronically Signed   By: Logan Bores M.D.   On: 10/21/2017 19:51   Dg Chest Right Decubitus  Result Date: 11/08/2017 CLINICAL DATA:  Pleural effusion. EXAM: CHEST - RIGHT DECUBITUS COMPARISON:  11/07/2017 FINDINGS: Decubital radiograph, right side down demonstrates layering left pleural effusion. There is a known right pleural effusion. Poor aeration of the right lung likely due to compression, large pulmonary masses and loculated right pleural effusion. IMPRESSION: Layering left pleural effusion. Loculated right pleural effusion.  Poor aeration of the right lung. Electronically Signed   By: Fidela Salisbury M.D.   On: 11/08/2017 21:36   Dg Chest Port 1 View  Result Date: 11/07/2017 CLINICAL DATA:  Pleural effusion. EXAM: PORTABLE CHEST 1 VIEW COMPARISON:  Multiple prior studies including CT 11/13/2017 FINDINGS: The heart margins are obscured by significant opacity in the RIGHT hemithorax. A RIGHT-sided small bore pleural drain is in place, overlying the heart and RIGHT lung base. There is increased opacity in the RIGHT hemithorax, consistent with pleural effusion and parenchymal opacity. There is volume loss in the RIGHT lung. LEFT basilar opacity partially obscures the hemidiaphragm. Multiple LEFT-sided skin folds. No pneumothorax. There are distended loops of bowel in the LEFT UPPER QUADRANT the abdomen. Remote bilateral rib fractures. IMPRESSION: 1. Increased opacity in the RIGHT lung. 2. Stable LEFT basilar opacity. Electronically Signed   By: Nolon Nations M.D.   On: 11/07/2017 10:49   Dg Chest Port 1 View  Result Date: 10/29/2017 CLINICAL DATA:  67 year old male with a history of cough  and shortness of breath EXAM: PORTABLE CHEST 1 VIEW COMPARISON:  Multiple prior chest x-rays most recent 11/01/2017, 10/29/2017, 11/01/2017, prior chest CT 10/19/2017 FINDINGS: Cardiomediastinal silhouette unchanged in size and contour, with heart borders partially obscured by lung/pleural disease. Significant right rotation of the patient somewhat limits evaluation, however, there is increasing opacity at the right lung with mixed interstitial, interlobular septal opacities, and airspace opacity with evidence of pleural effusion. Increasing opacity along the upper right mediastinal border/paratracheal border. No pneumothorax. Left lung remains relatively well aerated. IMPRESSION: Increasing opacity of the right lung, likely a combination of airspace opacity/consolidation, lung volume loss, pleural effusion, and tumor in this patient with prior cross-sectional imaging demonstrating lung mass. Given the increasing airspace opacity, differential includes endobronchial tumor/debris leading to partial volume loss. Correlation with bronchoscopy may be useful. Electronically Signed   By: Corrie Mckusick D.O.   On: 11/16/2017 13:37   Dg Chest Port 1 View  Result Date: 11/01/2017 CLINICAL DATA:  Shortness of breath EXAM: PORTABLE CHEST 1 VIEW COMPARISON:  10/29/2017 FINDINGS: Cardiac shadow is stable. The left  lung remains clear. Old rib fractures on the left are noted. Large skin fold is noted over the left mid lung. Persistent increased density is noted in the right upper lobe consistent with the known underlying mass lesion. Patchy infiltrative changes are noted in the right middle lobe as well. Old rib fractures on the right are seen. IMPRESSION: Stable right upper lobe mass lesion with associated patchy infiltrates similar to that noted on prior CT examination. No large pleural effusion is seen. Electronically Signed   By: Inez Catalina M.D.   On: 11/01/2017 09:53   Dg Chest Port 1 View  Result Date:  10/29/2017 CLINICAL DATA:  Status post thoracentesis. EXAM: PORTABLE CHEST 1 VIEW COMPARISON:  Chest radiograph 11/03/2017 FINDINGS: Negative for pneumothorax following thoracentesis. There may be a small amount of right pleural fluid present. Extensive airspace densities throughout the right lung particularly in the perihilar regions. The degree of right lung consolidation may have slightly increased. Left lung remains clear. Heart size is grossly stable. Old right rib fractures. IMPRESSION: Negative for pneumothorax following thoracentesis. Extensive airspace disease and consolidation in the right lung and cannot exclude progression since prior examination. Electronically Signed   By: Markus Daft M.D.   On: 10/29/2017 10:23   Dg Shoulder Left  Result Date: 10/23/2017 CLINICAL DATA:  [place] striking the left side today. Recent distal humeral fracture. EXAM: LEFT SHOULDER - 2+ VIEW COMPARISON:  Left humerus series of Nov 17, 2009 FINDINGS: There is chronic deformity of the distal clavicle. No acute clavicular fracture is observed. The scapula and proximal humerus appear normal. There is narrowing of the glenohumeral joint space. IMPRESSION: There is no acute fracture or dislocation of the left shoulder. 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 There is chronic deformity of the distal clavicle. Electronically Signed   By: David  Martinique M.D.   On: 10/22/2017 11:20   Dg Foot Complete Left  Result Date: 11/01/2017 CLINICAL DATA:  Left heel pain. EXAM: LEFT FOOT - COMPLETE 3+ VIEW COMPARISON:  09/21/2017. FINDINGS: Osteopenia. Mild degenerative changes at the first metatarsophalangeal joint with hallux valgus. Single screw traverses the posterior calcaneus with adjacent bony overgrowth. No acute osseous abnormality. IMPRESSION: 1. Postoperative changes in the calcaneus without acute osseous abnormality. 2. Mild first metatarsophalangeal joint osteoarthritis. Electronically Signed   By: Lorin Picket M.D.   On: 11/01/2017 09:57   Dg Swallowing Func-speech Pathology  Result Date: 11/03/2017 Objective Swallowing Evaluation: Type of Study: MBS-Modified Barium Swallow Study  Patient Details Name: MAVERICK DIEUDONNE MRN: [id number] Date of Birth: 09-Jul-1951 Today's Date: 11/03/2017 Time: SLP Start Time (ACUTE ONLY): 0910 -SLP Stop Time (ACUTE ONLY): 0930 SLP Time Calculation (min) (ACUTE ONLY): 20 min Past Medical History: Past Medical History: Diagnosis Date . Diabetes mellitus without complication ([place])   Type II . Diastolic dysfunction   a. ECHO 07/2013 EF 55-60% and grade 1 diastolic dysfunction . NSTEMI (non-ST elevated myocardial infarction) ([place])   problem list 01/2014 . Rhabdomyolysis 01/2014 . Seizures ([place])  . Syncope 01/2014 . Traumatic brain injury [facility])  Past Surgical History: Past Surgical History: Procedure Laterality Date . CRANIOTOMY   . VIDEO BRONCHOSCOPY Bilateral 10/19/2017  Procedure: VIDEO BRONCHOSCOPY WITH FLUORO;  Surgeon: Collene Gobble, MD;  Location: [facility];  Service: Cardiopulmonary;  Laterality: Bilateral; HPI: 67 yo male adm to [facility] with AMS - Pt found to have right lung mass, TBI, SDH, ETOH use, seizures, essentially wheelchair bound - s/p falls with left humerus fx and ankle  injury.  Pt is s/p thoracentesis 10/29/17 and bronchoscopy 10/28/2017 with findings abnormal cells.  Swallow eval ordered.   Imaging studies showed right cerebral encephalomalacia and severe cerebellar atrophy.   Pt underwent BSE and was found to be high aspiration risk, MBS ordered.   Subjective: pt awake in chair Assessment / Plan / Recommendation CHL IP CLINICAL IMPRESSIONS 11/03/2017 Clinical Impression Pt presents with mild oral dysphagia and intact pharyngeal swallow ability.  NO aspiration or penetration noted during MBS.  Pt demonstrates extended breath hold independently with swallowing liquids.  Difficulty masticating and transiting solid was noted - likely due to dry bolus, xerostomia and poor  dentition.  He did not cough during entire MBS however and thus will follow up briefly to assure tolerance.  SLP Visit Diagnosis Dysphagia, oral phase (R13.11) Attention and concentration deficit following -- Frontal lobe and executive function deficit following -- Impact on safety and function Mild aspiration risk   CHL IP TREATMENT RECOMMENDATION 11/03/2017 Treatment Recommendations Therapy as outlined in treatment plan below   Prognosis 11/03/2017 Prognosis for Safe Diet Advancement Good Barriers to Reach Goals -- Barriers/Prognosis Comment -- CHL IP DIET RECOMMENDATION 11/03/2017 SLP Diet Recommendations Dysphagia 3 (Mech soft) solids;Thin liquid Liquid Administration via Straw Medication Administration Other (Comment) Compensations Minimize environmental distractions;Slow rate;Small sips/bites Postural Changes Remain semi-upright after after feeds/meals (Comment);Seated upright at 90 degrees   CHL IP OTHER RECOMMENDATIONS 11/03/2017 Recommended Consults -- Oral Care Recommendations Oral care QID Other Recommendations --   CHL IP FOLLOW UP RECOMMENDATIONS 10/27/2017 Follow up Recommendations Other (comment)   CHL IP FREQUENCY AND DURATION 11/03/2017 Speech Therapy Frequency (ACUTE ONLY) min 1 x/week Treatment Duration 1 week      CHL IP ORAL PHASE 11/03/2017 Oral Phase Impaired Oral - Pudding Teaspoon -- Oral - Pudding Cup -- Oral - Honey Teaspoon -- Oral - Honey Cup -- Oral - Nectar Teaspoon WFL Oral - Nectar Cup -- Oral - Nectar Straw -- Oral - Thin Teaspoon WFL Oral - Thin Cup -- Oral - Thin Straw WFL;Piecemeal swallowing Oral - Puree WFL;Piecemeal swallowing Oral - Mech Soft -- Oral - Regular Impaired mastication;Weak lingual manipulation;Piecemeal swallowing;Delayed oral transit Oral - Multi-Consistency -- Oral - Pill WFL Oral Phase - Comment --  CHL IP PHARYNGEAL PHASE 11/03/2017 Pharyngeal Phase WFL Pharyngeal- Pudding Teaspoon -- Pharyngeal -- Pharyngeal- Pudding Cup -- Pharyngeal -- Pharyngeal- Honey Teaspoon  -- Pharyngeal -- Pharyngeal- Honey Cup -- Pharyngeal -- Pharyngeal- Nectar Teaspoon -- Pharyngeal -- Pharyngeal- Nectar Cup -- Pharyngeal -- Pharyngeal- Nectar Straw -- Pharyngeal -- Pharyngeal- Thin Teaspoon -- Pharyngeal -- Pharyngeal- Thin Cup -- Pharyngeal -- Pharyngeal- Thin Straw -- Pharyngeal -- Pharyngeal- Puree -- Pharyngeal -- Pharyngeal- Mechanical Soft -- Pharyngeal -- Pharyngeal- Regular -- Pharyngeal -- Pharyngeal- Multi-consistency -- Pharyngeal -- Pharyngeal- Pill -- Pharyngeal -- Pharyngeal Comment --  CHL IP CERVICAL ESOPHAGEAL PHASE 11/03/2017 Cervical Esophageal Phase WFL Pudding Teaspoon -- Pudding Cup -- Honey Teaspoon -- Honey Cup -- Nectar Teaspoon -- Nectar Cup -- Nectar Straw -- Thin Teaspoon -- Thin Cup -- Thin Straw -- Puree -- Mechanical Soft -- Regular -- Multi-consistency -- Pill -- Cervical Esophageal Comment -- No flowsheet data found. 11/03/2017, 9:49 AM Luanna Salk, MS [facility] SLP [phone]                CBC Recent Labs  Lab 11/06/17 0301 11/07/17 0848 11/09/17 0404 11/11/17 0437 11/12/17 0353  WBC 7.6 10.3 11.7* 11.8* 13.0*  HGB 10.6* 11.0* 10.2* 10.0* 10.7*  HCT 33.0* 35.5* 32.5*  31.2* 33.0*  PLT 471* 497* 587* 553* 577*  MCV 80.5 81.2 80.4 78.6 78.4  MCH 25.9* 25.2* 25.2* 25.2* 25.4*  MCHC 32.1 31.0 31.4 32.1 32.4  RDW 15.0 15.1 15.0 14.6 15.0    Chemistries  Recent Labs  Lab 11/07/17 0848 11/08/17 0219 11/09/17 0404 11/11/17 0253 11/12/17 0353  NA 129* 128* 129* 126* 126*  K 4.5 4.0 4.1 4.4 4.6  CL 98* 98* 97* 94* 92*  CO2 23 23 24 22  20*  GLUCOSE 218* 175* 202* 162* 166*  BUN 9 6 8 8 8   CREATININE 0.58* 0.52* 0.52* 0.51* 0.56*  CALCIUM 9.1 8.8* 8.8* 8.7* 8.8*  MG  --   --   --  1.7 1.8   ------------------------------------------------------------------------------------------------------------------ No results for input(s): CHOL, HDL, LDLCALC, TRIG, CHOLHDL, LDLDIRECT in the last 72 hours.  Lab Results  Component Value Date   HGBA1C  6.5 (H) 11/10/2017   ------------------------------------------------------------------------------------------------------------------ No results for input(s): TSH, T4TOTAL, T3FREE, THYROIDAB in the last 72 hours.  Invalid input(s): FREET3 ------------------------------------------------------------------------------------------------------------------ No results for input(s): VITAMINB12, FOLATE, FERRITIN, TIBC, IRON, RETICCTPCT in the last 72 hours.  Coagulation profile No results for input(s): INR, PROTIME in the last 168 hours.  No results for input(s): DDIMER in the last 72 hours.  Cardiac Enzymes No results for input(s): CKMB, TROPONINI, MYOGLOBIN in the last 168 hours.  Invalid input(s): CK ------------------------------------------------------------------------------------------------------------------    Component Value Date/Time   BNP 316.0 (H) 11/11/2017 5093     Roxan Hockey M.D on 11/12/2017 at 7:03 PM  Between 7am to 7pm - Pager - [phone]  After 7pm go to [email or web address] - password [email or web address]  [facility] -  Office  [phone]   Voice Recognition Viviann Spare dictation system was used to create this note, attempts have been made to correct errors. Please contact the author with questions and/or clarifications.

## 2017-11-12 NOTE — Progress Notes (Signed)
CSW received call back from pt Wyatt Salas regarding how to get set up with [place] in [place] when pt is ready.  Per CSW [facility] and [facility] for [facility]- once he has his appointment new PCP can consult oncology etc  Suggest pt bring 30 day supply of medications in case it takes a few weeks to get everything settled  CSW informed pt and will put his [facility] CSW contact information in AVS in case he has additional needs or questions.  Wyatt Ny, LCSW Clinical Social Worker [phone]

## 2017-11-13 ENCOUNTER — Inpatient Hospital Stay (HOSPITAL_COMMUNITY): Payer: Medicare HMO

## 2017-11-13 LAB — CBC
HEMATOCRIT: 32.7 % — AB (ref 39.0–52.0)
HEMOGLOBIN: 10.4 g/dL — AB (ref 13.0–17.0)
MCH: 25 pg — ABNORMAL LOW (ref 26.0–34.0)
MCHC: 31.8 g/dL (ref 30.0–36.0)
MCV: 78.6 fL (ref 78.0–100.0)
Platelets: 560 10*3/uL — ABNORMAL HIGH (ref 150–400)
RBC: 4.16 MIL/uL — ABNORMAL LOW (ref 4.22–5.81)
RDW: 15.2 % (ref 11.5–15.5)
WBC: 15.2 10*3/uL — ABNORMAL HIGH (ref 4.0–10.5)

## 2017-11-13 LAB — BASIC METABOLIC PANEL
ANION GAP: 10 (ref 5–15)
BUN: 8 mg/dL (ref 6–20)
CO2: 24 mmol/L (ref 22–32)
Calcium: 8.9 mg/dL (ref 8.9–10.3)
Chloride: 89 mmol/L — ABNORMAL LOW (ref 101–111)
Creatinine, Ser: 0.66 mg/dL (ref 0.61–1.24)
GFR calc Af Amer: >60 mL/min (ref >60)
GLUCOSE: 136 mg/dL — AB (ref 65–99)
POTASSIUM: 3.9 mmol/L (ref 3.5–5.1)
Sodium: 123 mmol/L — ABNORMAL LOW (ref 135–145)

## 2017-11-13 LAB — GLUCOSE, CAPILLARY
Glucose-Capillary: 140 mg/dL — ABNORMAL HIGH (ref 65–99)
Glucose-Capillary: 138 mg/dL — ABNORMAL HIGH (ref 65–99)
Glucose-Capillary: 197 mg/dL — ABNORMAL HIGH (ref 65–99)
Glucose-Capillary: 86 mg/dL (ref 65–99)

## 2017-11-13 LAB — ECHOCARDIOGRAM LIMITED
HEIGHTINCHES: 73 in
WEIGHTICAEL: 2303.37 [oz_av]

## 2017-11-13 LAB — MAGNESIUM: Magnesium: 1.7 mg/dL (ref 1.7–2.4)

## 2017-11-13 NOTE — Plan of Care (Signed)
Care plans reviewed, and patient is progressing.  

## 2017-11-13 NOTE — Progress Notes (Signed)
Pt has not voided since being in and out cathed at 1600 bladder scanned pt multiple times and only returned 160 ml's will recheck in a couple of hours

## 2017-11-13 NOTE — Progress Notes (Addendum)
Progress Note  Patient Name: Wyatt Salas Date of Encounter: 11/13/2017  Primary Cardiologist: Glenetta Hew, MD   Subjective   Denies any chest pain.  Lying flat in bed with no SOB.  HR in the 100's on tele  Inpatient Medications    Scheduled Meds: . amiodarone  400 mg Oral BID  . aspirin  81 mg Oral Daily  . atorvastatin  40 mg Oral q1800  . benzonatate  200 mg Oral BID  . docusate sodium  200 mg Oral BID  . feeding supplement (ENSURE ENLIVE)  237 mL Oral BID BM  . folic acid  1 mg Oral Daily  . heparin  5,000 Units Subcutaneous Q8H  . insulin aspart  0-5 Units Subcutaneous QHS  . insulin aspart  0-9 Units Subcutaneous TID WC  . lacosamide  100 mg Oral BID  . levETIRAcetam  1,500 mg Oral BID  . metoprolol tartrate  25 mg Oral BID  . multivitamin with minerals  1 tablet Oral Daily  . phenytoin  30 mg Oral BID  . sodium chloride flush  3 mL Intravenous Q12H  . tamsulosin  0.4 mg Oral Daily  . thiamine  100 mg Oral Daily   Continuous Infusions: . sodium chloride Stopped (11/11/17 2300)   PRN Meds: sodium chloride, acetaminophen, diltiazem, guaiFENesin-dextromethorphan, HYDROcodone-acetaminophen, ondansetron **OR** ondansetron (ZOFRAN) IV, phenol, polyethylene glycol, polyvinyl alcohol, sodium chloride flush   Vital Signs    Vitals:   11/13/17 0005 11/13/17 0017 11/13/17 0416 11/13/17 0900  BP: (!) 124/109 (!) 89/66 94/81 100/70  Pulse: (!) 120  (!) 117   Resp: (!) 41 (!) 23 19   Temp: 98.4 F (36.9 C)  98.5 F (36.9 C) 98.1 F (36.7 C)  TempSrc: Oral  Oral Axillary  SpO2: 92%  98% 95%  Weight:   143 lb 15.4 oz (65.3 kg)   Height:        Intake/Output Summary (Last 24 hours) at 11/13/2017 0933 Last data filed at 11/13/2017 0900 Gross per 24 hour  Intake 468 ml  Output 700 ml  Net -232 ml   Filed Weights   11/11/17 0500 11/12/17 0320 11/13/17 0416  Weight: 134 lb 0.6 oz (60.8 kg) 133 lb 6.1 oz (60.5 kg) 143 lb 15.4 oz (65.3 kg)    Telemetry      Atrial flutter with RVR at 110bpm - Personally Reviewed  ECG    No new EKG to review - Personally Reviewed  Physical Exam   GEN: No acute distress.   Neck: No JVD Cardiac: tachycardic, no murmurs, rubs, or gallops.  Respiratory: Clear to auscultation bilaterally. GI: Soft, nontender, non-distended  MS: No edema; No deformity. Neuro:  Nonfocal  Psych: Normal affect   Labs    Chemistry Recent Labs  Lab 11/11/17 0253 11/12/17 0353 11/13/17 0541  NA 126* 126* 123*  K 4.4 4.6 3.9  CL 94* 92* 89*  CO2 22 20* 24  GLUCOSE 162* 166* 136*  BUN 8 8 8   CREATININE 0.51* 0.56* 0.66  CALCIUM 8.7* 8.8* 8.9  GFRNONAA >60 >60 >60  GFRAA >60 >60 >60  ANIONGAP 10 14 10      Hematology Recent Labs  Lab 11/11/17 0437 11/12/17 0353 11/13/17 0541  WBC 11.8* 13.0* 15.2*  RBC 3.97* 4.21* 4.16*  HGB 10.0* 10.7* 10.4*  HCT 31.2* 33.0* 32.7*  MCV 78.6 78.4 78.6  MCH 25.2* 25.4* 25.0*  MCHC 32.1 32.4 31.8  RDW 14.6 15.0 15.2  PLT 553* 577* 560*  Cardiac EnzymesNo results for input(s): TROPONINI in the last 168 hours. No results for input(s): TROPIPOC in the last 168 hours.   BNP Recent Labs  Lab 11/11/17 0931  BNP 316.0*     DDimer No results for input(s): DDIMER in the last 168 hours.   Radiology    No results found.  Cardiac Studies   2D echo 11/03/2017 Study Conclusions  - Left ventricle: The cavity size was normal. Wall thickness was normal. Systolic function was mildly to moderately reduced. The estimated ejection fraction was in the range of 40% to 45%. Diffuse hypokinesis. Features are consistent with a pseudonormal left ventricular filling pattern, with concomitant abnormal relaxation and increased filling pressure (grade 2 diastolic dysfunction). - Mitral valve: There was mild regurgitation. - Tricuspid valve: There was trivial regurgitation. - Pericardium, extracardiac: A large pericardial effusion (up to 3cm) was identified  circumferential to the heart. The fluid had no internal echoes. No RA or RV collapse. IVC is small in diameter but does not demonstrate respiratory variation. There was no significant evidence of hemodynamic compromise.  Impressions:  - Pericardial effusion is new when compared to prior.  2D echo limited 4/20 Study Conclusions  - Left ventricle: The cavity size was mildly dilated. The estimated ejection fraction was 40%. Diffuse hypokinesis. - Mitral valve: There was mild regurgitation. - Pericardium, extracardiac: Post pericardiocentesis much improved with trivial residual effusion    Patient Profile     67 y.o. male with a hx of high fall risk with multiple falls in the past 2 months with multiple fractures, wheel chair dependent, remote hx of subdural hematoma s/p right sided craniotomy (?2000), seizure disorder, alcohol abuse, chronic hep C, HTN, HDL, chronic diastolic heart failure, and lung mass concerning  Assessment & Plan    1. Atrial flutter with RVR - HR remains elevated on amiodarone 400 mg twice daily  and added Lopressor 25 mg twice daily and maintaining around 110bpm -BP toosoft to titrate Lopressor up (BP 94/81 to 100/77mmHg)  mmHg -CHADS2VASC score is elevated at 85 (age >65, HTN, DM, CHF) but not a candidate for anticoagulation due to history of multiple falls resulting in fractures as well as h/o subdural hematoma and bloody pericardial effusion.  -since he is not a candidate for anticoagulation he is also not a candidate for TEE/DCCV  2. Large pericardial effusion with tamponade s/p pericardiocentesis  - 750cc bloody fluid removed. Cytologyconfirms malignant pericardial effusion consistent with non-small cell carcinoma -Repeat 2D echocardiogram showed EF 40 to 45% with akinesis of the inferior lateral myocardium. There was a trivial pericardial effusion identified. - since BP remains very soft will get repeat limited echo this am to see  if pericardial effusion has reaccumulated  3. Chronic combined systolic/diastolic CHF - 2D echo this admit showed EF 40-45% with G2DD - possibly tachy mediated from rapid atrial flutter -Not been started on ACE/ARB due to soft blood pressureand need for beta-blocker therapy to control heart rate -Continue Lopressor 25 mg twice daily -not a cath candidate due to metastatic lung CA -Put out 700cc urine yesterday and is net -2.1 L -Weight is still up 10 pounds from yesterday ? accuracy -Creatinine is stable at 0.66 -BNP was mildly elevated yesterday 316 but likely due to underlying metastatic disease with malignant effusions - BP too soft for diuresis  4. 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      Signed, Fransico Him, MD  11/13/2017, 9:33 AM

## 2017-11-13 NOTE — Progress Notes (Signed)
Patient Demographics:    Wyatt Salas, is a 67 y.o. male, DOB - [birth year], DGL:[id number]  Admit date - 10/30/2017   Admitting Physician Vianne Bulls, MD  Outpatient Primary MD for the patient is [name], MD  LOS - 31   Chief Complaint  Patient presents with  . Fall  . Shoulder Pain  . Headache        Subjective:    Wyatt Salas today has no fevers, no emesis,  No chest pain, no dizziness or palpitations  Assessment  & Plan :    Principal Problem:   Atrial fibrillation with rapid ventricular response (HCC) Active Problems:   Seizure disorder (HCC)   Type 2 diabetes mellitus with hyperlipidemia (HCC)   Lung mass   Essential hypertension   Acute urinary retention   Chronic diastolic CHF (congestive heart failure) (HCC)   Closed fracture of distal end of left humerus   Protein-calorie malnutrition, severe   Acute combined systolic and diastolic heart failure (HCC)   Cardiac/pericardial tamponade   Goals of care, counseling/discussion   Palliative care by specialist   Atrial flutter ([place])   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  Upon admission on the chest x-ray there was concern of right upper lobe posterior mass like lesion.  His atrial fibrillation was treated with Cardizem and Lopressor.  He also developed pleural effusion during his hospital course and underwent thoracentesis on October 29, 2017.  Pleural fluid suggested of few malignant cells and then later suggested he had poorly differentiated non-small cell carcinoma.  He is also developed  pericardial effusion which is noted to be malignant and about 750 cc of this was drained.  His atrial fibrillation has remained difficult to control therefore currently is on amiodarone and Lopressor.  Cardiology is following but difficult to titrate this given his soft blood pressure.  Patient is also been seen by palliative care, patient plans to move to [place] once he can establish care with a PCP at [place]:- 1)Atrial flutter with rapid ventricular response rate; CHADScVASC 4- (age >68, HTN, DM, CHF) Patient with history of recurrent falls and prior subdural hematoma, also has history of malignant pericardial effusion, not a good candidate for anticoagulation, -Currently heart rate mostly in the 100s to 110s, cardiologist advised amiodarone to 400 mg twice daily, along with Lopressor 25 mg twice daily .  No room to uptitrate today as her blood pressure has soft.  Rate control did not improve with with calcium channel blockers and did not improve with digoxin either.    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  Repeat echocardiogram showed questionable effusion.  Will need to closely monitor this for any signs of decompensation. -he would prefer to follow-up in [place] at the [facility] because that is where he is from. - I do  not think patient is strong enough at this time for chemoradiation.  Palliative care team is following.  Repeat echo on 11/13/2017 with trivial pericardial effusion, EF 40 to 45% with akinesis of the inferior lateral wall  3)H/o Essential hypertension- -Continue Lopressor for rate control, BP has been soft lately  4)Chronic combined systolic and diastolic congestive heart failure, class II Coronary artery disease --Ejection fraction 40-45% with  grade 2 diastolic dysfunction and diffuse hypokinesis, -Closely monitor his volume status. -Continue aspirin, statin, beta-blocker.  Not on ACE inhibitor due to concerns of low blood pressure. Repeat echo on 11/13/2017 with trivial pericardial effusion, EF 40 to 45% with akinesis of the inferior lateral wall  5)Left humerus fracture- -Secondary to fall, follow-up outpatient with Ortho  6)Diabetes mellitus type 2- -Hemoglobin A1c- 6.5, Continue Lantus and sliding scale  7)History of seizure disorder- -Continue Keppra and Dilantin  8)Urinary retention - Likely has underlying BPH- -Continue Flomax.  may need Foley reinserted if urinary retention issues persist  9)History of alcohol abuse- -No longer at risk of withdrawal as he has been here for several days. -Continue thiamine 100 mg daily, multivitamin 1 tab daily  Osteoarthritis -Symptomatic management  Hyponatremia -Consistent with SIADH  Generalized weakness and deconditioning Moderate  protein calorie malnutrition -Physical therapy recommends skilled nursing facility.  At this time holding off on transitioning him to skilled nursing facility given his uncontrolled heart rate.  Disposition-   We do not have many options for treating his rapid atrial flutter which is likely being driven by his underlying malignancy.   Blood pressure is too soft to titrate medicines further given his overall debilitated state and metastatic lung CA recommend palliative care.   Code Status: Full code  Family Communication:  none at bedside Disposition Plan: Maintain inpatient stay for better heart rate control.  Cardiology is following.  Once this is better to begin transitioning to skilled nursing facility.  Consultants:   Cardiology/palliative  Procedures:   Thoracentesis 4/15  Bronchoscopy 4/16   DVT Prophylaxis  :   Heparin -    Lab Results  Component Value Date   PLT 560 (H) 11/13/2017    Inpatient Medications  Scheduled  Meds: . amiodarone  400 mg Oral BID  . aspirin  81 mg Oral Daily  . atorvastatin  40 mg Oral q1800  . benzonatate  200 mg Oral BID  . docusate sodium  200 mg Oral BID  . feeding supplement (ENSURE ENLIVE)  237 mL Oral BID BM  . folic acid  1 mg Oral Daily  . heparin  5,000 Units Subcutaneous Q8H  . insulin aspart  0-5 Units Subcutaneous QHS  . insulin aspart  0-9 Units Subcutaneous TID WC  . lacosamide  100 mg Oral BID  . levETIRAcetam  1,500 mg Oral BID  . metoprolol tartrate  25 mg Oral BID  . multivitamin with minerals  1 tablet Oral Daily  . phenytoin  30 mg Oral BID  . sodium chloride flush  3 mL Intravenous Q12H  . tamsulosin  0.4 mg Oral Daily  . thiamine  100 mg Oral Daily   Continuous Infusions: . sodium chloride Stopped (11/11/17 2300)   PRN Meds:.sodium chloride, acetaminophen, diltiazem, guaiFENesin-dextromethorphan, HYDROcodone-acetaminophen, ondansetron **OR** ondansetron (ZOFRAN) IV, phenol, polyethylene glycol, polyvinyl alcohol, sodium chloride flush    Anti-infectives (From admission, onward)   None        Objective:   Vitals:   11/13/17 0416 11/13/17 0900 11/13/17 1144 11/13/17 1200  BP: 94/81 100/70  100/70  Pulse: (!) 117  (!) 123 (!) 110  Resp: 19   (!) 23  Temp: 98.5 F (36.9 C) 98.1 F (36.7 C)  98.3 F (36.8 C)  TempSrc: Oral Axillary    SpO2: 98% 95%  95%  Weight: 65.3 kg (143 lb 15.4 oz)     Height:        Wt Readings from Last 3 Encounters:  11/13/17 65.3 kg (143 lb 15.4 oz)  09/20/17 68 kg (150 lb)  09/13/17 65.8 kg (145 lb)     Intake/Output Summary (Last 24 hours) at 11/13/2017 1817 Last data filed at 11/13/2017 1619 Gross per 24 hour  Intake 588 ml  Output 1050 ml  Net -462 ml   Physical Exam Gen:- Awake Alert,  In no apparent distress  HEENT:- [place].AT, No sclera icterus Neck-Supple Neck,No JVD,.  Lungs-  CTAB , fair movement CV- S1, S2 normal, tachycardic and irregular Abd-  +ve B.Sounds, Abd Soft, No tenderness,      Extremity/Skin:-Negative Homans Psych-affect is appropriate, oriented x3 Neuro-no new focal deficits, no tremors   Data Review:   Micro Results Recent Results (from the past 240 hour(s))  Body fluid culture     Status: None   Collection Time: 11/15/2017  4:33 PM  Result Value Ref Range Status   Specimen Description PERICARDIAL [facility]  Final   Special Requests NONE  Final   Gram Stain   Final    FEW WBC PRESENT, PREDOMINANTLY PMN NO ORGANISMS SEEN    Culture   Final    NO GROWTH 3 DAYS Performed at [facility], [address] [place], [place] [address]    Report Status 11/09/2017 FINAL  Final    Radiology Reports Ct Abdomen Pelvis Wo Contrast  Result Date: 11/08/2017 CLINICAL DATA:  Left shoulder and forehead pain after fall this morning. Lung mass. EXAM: CT CHEST, ABDOMEN, AND PELVIS WITHOUT CONTRAST TECHNIQUE: Multidetector CT imaging of the chest, abdomen and pelvis was performed without IV contrast. CONTRAST:  The patient's IV infiltrated during imaging and the order was changed to CT of the chest, abdomen and pelvis without per ordering clinician. COMPARISON:  PET-CT 05/31/2017 FINDINGS: CT CHEST FINDINGS Cardiovascular: Top-normal size heart with left main and three-vessel coronary arteriosclerosis. Aortic atherosclerosis without aneurysm. The unenhanced pulmonary vasculature is difficult to assess beyond the main pulmonary artery due to soft tissue masses and adenopathy about the right hilum and mediastinum. Mediastinum/Nodes: Interval increase in masslike abnormalities in the mediastinum and right hilum compatible with progression of metastatic adenopathy and/or increase and extension of known right upper lobe mass. Largest lymph node identified is subcarinal at 2.8 cm. Interval increase in right upper paratracheal lymph node measuring 1.1 cm. Exact margins of additional lymph node masses are difficult to delineate on this unenhanced study. There is luminal narrowing of the  distal right mainstem bronchus without occlusion secondary to the right hilar, mediastinal and subcarinal masslike abnormalities. The thyroid gland is unremarkable. The esophagus is not well visualized. Lungs/Pleura: Partially loculated small to moderate right effusion. New pulmonary nodular masslike opacities are noted within the right upper, middle and lower lobes with interval progression in size of spiculated right upper lobe mass now measuring approximately 5.8 x 5.3 x 5 cm. Within the superior segment of the aerated right lower lobe are metastatic nodules measuring up to 2.2 cm. Additional nodules are seen in the right middle and anterior right lower lobe. Left lung remains clear. Nodular thickening along the left major fissure  is seen which may represent fluid in the fissure versus an intra fissural mass. This measures 2.2 x 1.2 x 1.7 cm. Peribronchial thickening is noted within both lower lobes. Musculoskeletal: Remote right posterior seventh through ninth rib fractures. No suspicious osseous lesions. Extravasated contrast noted in the soft tissues of the right arm. CT ABDOMEN PELVIS FINDINGS Hepatobiliary: No biliary dilatation. The unenhanced liver is unremarkable. The gallbladder is physiologically distended. Pancreas: No ductal dilatation or inflammation. Spleen: No splenomegaly or mass. Adrenals/Urinary Tract: No adrenal mass. Interpolar 2.5 cm simple right renal cyst. Unremarkable noncontrast appearance of the left kidney. No obstructive uropathy. Marked bladder distention to the umbilicus with the bladder measuring 14.7 x 14.1 x 18.8 cm (volume = 2040 cm^3). Stomach/Bowel: Stomach is within normal limits. Appendix appears normal. No evidence of bowel wall thickening, distention, or inflammatory changes. Vascular/Lymphatic: Moderate aortoiliac and branch vessel atherosclerosis. No aneurysm. No adenopathy. Reproductive: Normal size prostate. Other: No free air nor free fluid. Musculoskeletal:  Degenerative disc disease L5-S1. No aggressive osseous lesions. IMPRESSION: Extravasation of IV contrast into the patient's right arm. Study was flagged for contrast extravasation radiology PA follow-up should the patient be discharged. Chest CT: 1. Interval increase in spiculated right upper lobe mass now measuring approximately 5.8 x 5.3 x 5 cm with progression of metastatic mediastinal and hilar adenopathy as well as development of satellite pulmonary nodules in the right lung. Slight luminal narrowing of the distal right mainstem bronchus due to adenopathy. No occlusion noted however. 2. Ovoid density in the left major fissure may represent a pseudo lesion with fluid in the fissure versus an intra fissural mass measuring 2.2 x 1.2 x 1.7 cm. 3. Loculated moderate right pleural effusion. Abdomen and pelvic CT: 1. Marked urinary distention with volume of approximately 2 L. 2. Simple right interpolar renal cyst measuring 2.5 cm. 3. No apparent evidence of metastatic disease within the abdomen and pelvis given limitations of a noncontrast exam. Electronically Signed   By: Ashley Royalty M.D.   On: 11/06/2017 22:55   Dg Chest 2 View  Result Date: 11/07/2017 CLINICAL DATA:  Cough, history of lung cancer. EXAM: CHEST - 2 VIEW COMPARISON:  05/18/2017 chest CT, CXR 05/18/2017 FINDINGS: Interval increase in size of masslike opacity in the right upper lobe, best appreciated on the sagittal view measuring approximately 4 x 3.5 x 4.8 cm with right paratracheal and perihilar soft tissue prominence concerning for progression of lymphadenopathy. Superimposed adjacent pneumonia is difficult to entirely exclude. Hyperinflated left lung without pulmonary consolidation or dominant mass. Heart size is top-normal. No aortic aneurysm. No suspicious osseous lesions. IMPRESSION: 1. Interval increase in size of right upper lobe posterior masslike abnormality now estimated at 4 x 3.5 x 4.8 cm, previously estimated at approximately 3.6 cm.  2. Interval increase in soft tissue prominence about the mediastinum and right hilum compatible with metastatic lymphadenopathy. Superimposed adjacent airspace opacities cannot exclude pneumonia and/or areas of atelectasis. Electronically Signed   By: Ashley Royalty M.D.   On: 10/20/2017 19:37   Dg Elbow Complete Left (3+view)  Result Date: 10/29/2017 CLINICAL DATA:  Elbow fracture. EXAM: LEFT ELBOW - COMPLETE 3+ VIEW; LEFT FOREARM - 2 VIEW COMPARISON:  Left elbow and forearm x-rays dated September 20, 2017. FINDINGS: Left elbow: Unchanged mild cortical step-off along the medial aspect of the capitellum. No new fracture. Persistent elbow joint effusion. Osteopenia. Soft tissues are unremarkable. Left forearm: No radius or ulna fracture. No focal bone lesion. Osteopenia. Soft tissues are unremarkable. IMPRESSION: 1. Unchanged  minimally displaced fracture of the capitellum. Persistent elbow joint effusion. 2. No acute abnormality of the forearm. Electronically Signed   By: Titus Dubin M.D.   On: 10/29/2017 13:17   Dg Forearm Left  Result Date: 10/29/2017 CLINICAL DATA:  Elbow fracture. EXAM: LEFT ELBOW - COMPLETE 3+ VIEW; LEFT FOREARM - 2 VIEW COMPARISON:  Left elbow and forearm x-rays dated September 20, 2017. FINDINGS: Left elbow: Unchanged mild cortical step-off along the medial aspect of the capitellum. No new fracture. Persistent elbow joint effusion. Osteopenia. Soft tissues are unremarkable. Left forearm: No radius or ulna fracture. No focal bone lesion. Osteopenia. Soft tissues are unremarkable. IMPRESSION: 1. Unchanged minimally displaced fracture of the capitellum. Persistent elbow joint effusion. 2. No acute abnormality of the forearm. Electronically Signed   By: Titus Dubin M.D.   On: 10/29/2017 13:17   Ct Head Wo Contrast  Result Date: 11/10/2017 CLINICAL DATA:  [place] out of wheelchair today. Left forehead and left shoulder pain. Initial encounter. EXAM: CT HEAD WITHOUT CONTRAST CT CERVICAL SPINE  WITHOUT CONTRAST TECHNIQUE: Multidetector CT imaging of the head and cervical spine was performed following the standard protocol without intravenous contrast. Multiplanar CT image reconstructions of the cervical spine were also generated. COMPARISON:  09/21/2017 FINDINGS: CT HEAD FINDINGS Brain: Right MCA territory encephalomalacia is unchanged as is more extensive right temporal lobe encephalomalacia which may be in part postsurgical. There is marked cerebral scratched of there is marked cerebellar atrophy. Periventricular white matter hypodensities are unchanged and nonspecific but compatible with mild chronic small vessel ischemic disease. No acute large territory infarct, intracranial hemorrhage, mass, midline shift, or extra-axial fluid collection is identified. Vascular: Calcified atherosclerosis at the skull base. No hyperdense vessel. Skull: Prior right pterional craniotomy.  No acute fracture. Sinuses/Orbits: The visualized paranasal sinuses and mastoid air cells are clear. The orbits are unremarkable. Other: None. CT CERVICAL SPINE FINDINGS Alignment: Chronic reversal of the normal cervical lordosis. Unchanged grade 1 anterolisthesis of C2 on C3 and C3 on C4. Skull base and vertebrae: No acute fracture or suspicious osseous lesion. Soft tissues and spinal canal: No prevertebral fluid or swelling. No visible canal hematoma. Disc levels: Moderate diffuse cervical disc degeneration and moderate to severe left greater than right mid upper cervical facet arthrosis resulting in advanced multilevel neural foraminal stenosis. Upper chest: Right pleural effusion. Unchanged 3 mm left apical lung nodule. Other: Moderate calcified atherosclerosis at the carotid bifurcations. IMPRESSION: 1. No evidence of acute intracranial abnormality. 2. Right cerebral hemisphere encephalomalacia. Severe cerebellar atrophy. 3. No acute cervical spine fracture. Electronically Signed   By: Logan Bores M.D.   On: 10/20/2017 19:51    Ct Chest Wo Contrast  Result Date: 10/25/2017 CLINICAL DATA:  Left shoulder and forehead pain after fall this morning. Lung mass. EXAM: CT CHEST, ABDOMEN, AND PELVIS WITHOUT CONTRAST TECHNIQUE: Multidetector CT imaging of the chest, abdomen and pelvis was performed without IV contrast. CONTRAST:  The patient's IV infiltrated during imaging and the order was changed to CT of the chest, abdomen and pelvis without per ordering clinician. COMPARISON:  PET-CT 05/31/2017 FINDINGS: CT CHEST FINDINGS Cardiovascular: Top-normal size heart with left main and three-vessel coronary arteriosclerosis. Aortic atherosclerosis without aneurysm. The unenhanced pulmonary vasculature is difficult to assess beyond the main pulmonary artery due to soft tissue masses and adenopathy about the right hilum and mediastinum. Mediastinum/Nodes: Interval increase in masslike abnormalities in the mediastinum and right hilum compatible with progression of metastatic adenopathy and/or increase and extension of known right upper lobe  mass. Largest lymph node identified is subcarinal at 2.8 cm. Interval increase in right upper paratracheal lymph node measuring 1.1 cm. Exact margins of additional lymph node masses are difficult to delineate on this unenhanced study. There is luminal narrowing of the distal right mainstem bronchus without occlusion secondary to the right hilar, mediastinal and subcarinal masslike abnormalities. The thyroid gland is unremarkable. The esophagus is not well visualized. Lungs/Pleura: Partially loculated small to moderate right effusion. New pulmonary nodular masslike opacities are noted within the right upper, middle and lower lobes with interval progression in size of spiculated right upper lobe mass now measuring approximately 5.8 x 5.3 x 5 cm. Within the superior segment of the aerated right lower lobe are metastatic nodules measuring up to 2.2 cm. Additional nodules are seen in the right middle and anterior right  lower lobe. Left lung remains clear. Nodular thickening along the left major fissure is seen which may represent fluid in the fissure versus an intra fissural mass. This measures 2.2 x 1.2 x 1.7 cm. Peribronchial thickening is noted within both lower lobes. Musculoskeletal: Remote right posterior seventh through ninth rib fractures. No suspicious osseous lesions. Extravasated contrast noted in the soft tissues of the right arm. CT ABDOMEN PELVIS FINDINGS Hepatobiliary: No biliary dilatation. The unenhanced liver is unremarkable. The gallbladder is physiologically distended. Pancreas: No ductal dilatation or inflammation. Spleen: No splenomegaly or mass. Adrenals/Urinary Tract: No adrenal mass. Interpolar 2.5 cm simple right renal cyst. Unremarkable noncontrast appearance of the left kidney. No obstructive uropathy. Marked bladder distention to the umbilicus with the bladder measuring 14.7 x 14.1 x 18.8 cm (volume = 2040 cm^3). Stomach/Bowel: Stomach is within normal limits. Appendix appears normal. No evidence of bowel wall thickening, distention, or inflammatory changes. Vascular/Lymphatic: Moderate aortoiliac and branch vessel atherosclerosis. No aneurysm. No adenopathy. Reproductive: Normal size prostate. Other: No free air nor free fluid. Musculoskeletal: Degenerative disc disease L5-S1. No aggressive osseous lesions. IMPRESSION: Extravasation of IV contrast into the patient's right arm. Study was flagged for contrast extravasation radiology PA follow-up should the patient be discharged. Chest CT: 1. Interval increase in spiculated right upper lobe mass now measuring approximately 5.8 x 5.3 x 5 cm with progression of metastatic mediastinal and hilar adenopathy as well as development of satellite pulmonary nodules in the right lung. Slight luminal narrowing of the distal right mainstem bronchus due to adenopathy. No occlusion noted however. 2. Ovoid density in the left major fissure may represent a pseudo lesion  with fluid in the fissure versus an intra fissural mass measuring 2.2 x 1.2 x 1.7 cm. 3. Loculated moderate right pleural effusion. Abdomen and pelvic CT: 1. Marked urinary distention with volume of approximately 2 L. 2. Simple right interpolar renal cyst measuring 2.5 cm. 3. No apparent evidence of metastatic disease within the abdomen and pelvis given limitations of a noncontrast exam. Electronically Signed   By: Ashley Royalty M.D.   On: 11/16/2017 22:55   Ct Cervical Spine Wo Contrast  Result Date: 11/10/2017 CLINICAL DATA:  [place] out of wheelchair today. Left forehead and left shoulder pain. Initial encounter. EXAM: CT HEAD WITHOUT CONTRAST CT CERVICAL SPINE WITHOUT CONTRAST TECHNIQUE: Multidetector CT imaging of the head and cervical spine was performed following the standard protocol without intravenous contrast. Multiplanar CT image reconstructions of the cervical spine were also generated. COMPARISON:  09/21/2017 FINDINGS: CT HEAD FINDINGS Brain: Right MCA territory encephalomalacia is unchanged as is more extensive right temporal lobe encephalomalacia which may be in part postsurgical. There is marked  cerebral scratched of there is marked cerebellar atrophy. Periventricular white matter hypodensities are unchanged and nonspecific but compatible with mild chronic small vessel ischemic disease. No acute large territory infarct, intracranial hemorrhage, mass, midline shift, or extra-axial fluid collection is identified. Vascular: Calcified atherosclerosis at the skull base. No hyperdense vessel. Skull: Prior right pterional craniotomy.  No acute fracture. Sinuses/Orbits: The visualized paranasal sinuses and mastoid air cells are clear. The orbits are unremarkable. Other: None. CT CERVICAL SPINE FINDINGS Alignment: Chronic reversal of the normal cervical lordosis. Unchanged grade 1 anterolisthesis of C2 on C3 and C3 on C4. Skull base and vertebrae: No acute fracture or suspicious osseous lesion. Soft tissues  and spinal canal: No prevertebral fluid or swelling. No visible canal hematoma. Disc levels: Moderate diffuse cervical disc degeneration and moderate to severe left greater than right mid upper cervical facet arthrosis resulting in advanced multilevel neural foraminal stenosis. Upper chest: Right pleural effusion. Unchanged 3 mm left apical lung nodule. Other: Moderate calcified atherosclerosis at the carotid bifurcations. IMPRESSION: 1. No evidence of acute intracranial abnormality. 2. Right cerebral hemisphere encephalomalacia. Severe cerebellar atrophy. 3. No acute cervical spine fracture. Electronically Signed   By: Logan Bores M.D.   On: 10/29/2017 19:51   Dg Chest Right Decubitus  Result Date: 11/08/2017 CLINICAL DATA:  Pleural effusion. EXAM: CHEST - RIGHT DECUBITUS COMPARISON:  11/07/2017 FINDINGS: Decubital radiograph, right side down demonstrates layering left pleural effusion. There is a known right pleural effusion. Poor aeration of the right lung likely due to compression, large pulmonary masses and loculated right pleural effusion. IMPRESSION: Layering left pleural effusion. Loculated right pleural effusion.  Poor aeration of the right lung. Electronically Signed   By: Fidela Salisbury M.D.   On: 11/08/2017 21:36   Dg Chest Port 1 View  Result Date: 11/07/2017 CLINICAL DATA:  Pleural effusion. EXAM: PORTABLE CHEST 1 VIEW COMPARISON:  Multiple prior studies including CT 11/11/2017 FINDINGS: The heart margins are obscured by significant opacity in the RIGHT hemithorax. A RIGHT-sided small bore pleural drain is in place, overlying the heart and RIGHT lung base. There is increased opacity in the RIGHT hemithorax, consistent with pleural effusion and parenchymal opacity. There is volume loss in the RIGHT lung. LEFT basilar opacity partially obscures the hemidiaphragm. Multiple LEFT-sided skin folds. No pneumothorax. There are distended loops of bowel in the LEFT UPPER QUADRANT the abdomen.  Remote bilateral rib fractures. IMPRESSION: 1. Increased opacity in the RIGHT lung. 2. Stable LEFT basilar opacity. Electronically Signed   By: Nolon Nations M.D.   On: 11/07/2017 10:49   Dg Chest Port 1 View  Result Date: 11/11/2017 CLINICAL DATA:  67 year old male with a history of cough and shortness of breath EXAM: PORTABLE CHEST 1 VIEW COMPARISON:  Multiple prior chest x-rays most recent 11/01/2017, 10/29/2017, 11/16/2017, prior chest CT 11/01/2017 FINDINGS: Cardiomediastinal silhouette unchanged in size and contour, with heart borders partially obscured by lung/pleural disease. Significant right rotation of the patient somewhat limits evaluation, however, there is increasing opacity at the right lung with mixed interstitial, interlobular septal opacities, and airspace opacity with evidence of pleural effusion. Increasing opacity along the upper right mediastinal border/paratracheal border. No pneumothorax. Left lung remains relatively well aerated. IMPRESSION: Increasing opacity of the right lung, likely a combination of airspace opacity/consolidation, lung volume loss, pleural effusion, and tumor in this patient with prior cross-sectional imaging demonstrating lung mass. Given the increasing airspace opacity, differential includes endobronchial tumor/debris leading to partial volume loss. Correlation with bronchoscopy may be useful. Electronically Signed  By: Corrie Mckusick D.O.   On: 10/28/2017 13:37   Dg Chest Port 1 View  Result Date: 11/01/2017 CLINICAL DATA:  Shortness of breath EXAM: PORTABLE CHEST 1 VIEW COMPARISON:  10/29/2017 FINDINGS: Cardiac shadow is stable. The left lung remains clear. Old rib fractures on the left are noted. Large skin fold is noted over the left mid lung. Persistent increased density is noted in the right upper lobe consistent with the known underlying mass lesion. Patchy infiltrative changes are noted in the right middle lobe as well. Old rib fractures on the right  are seen. IMPRESSION: Stable right upper lobe mass lesion with associated patchy infiltrates similar to that noted on prior CT examination. No large pleural effusion is seen. Electronically Signed   By: Inez Catalina M.D.   On: 11/01/2017 09:53   Dg Chest Port 1 View  Result Date: 10/29/2017 CLINICAL DATA:  Status post thoracentesis. EXAM: PORTABLE CHEST 1 VIEW COMPARISON:  Chest radiograph 10/19/2017 FINDINGS: Negative for pneumothorax following thoracentesis. There may be a small amount of right pleural fluid present. Extensive airspace densities throughout the right lung particularly in the perihilar regions. The degree of right lung consolidation may have slightly increased. Left lung remains clear. Heart size is grossly stable. Old right rib fractures. IMPRESSION: Negative for pneumothorax following thoracentesis. Extensive airspace disease and consolidation in the right lung and cannot exclude progression since prior examination. Electronically Signed   By: Markus Daft M.D.   On: 10/29/2017 10:23   Dg Shoulder Left  Result Date: 10/23/2017 CLINICAL DATA:  [place] striking the left side today. Recent distal humeral fracture. EXAM: LEFT SHOULDER - 2+ VIEW COMPARISON:  Left humerus series of Nov 17, 2009 FINDINGS: There is chronic deformity of the distal clavicle. No acute clavicular fracture is observed. The scapula and proximal humerus appear normal. There is narrowing of the glenohumeral joint space. IMPRESSION: There is no acute fracture or dislocation of the left shoulder. 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 There is chronic deformity of the distal clavicle. Electronically Signed   By: David  Martinique M.D.   On: 10/27/2017 11:20   Dg Foot Complete Left  Result Date: 11/01/2017 CLINICAL DATA:  Left heel pain. EXAM: LEFT FOOT - COMPLETE 3+ VIEW COMPARISON:  09/21/2017. FINDINGS: Osteopenia. Mild degenerative changes at the first metatarsophalangeal joint with hallux valgus.  Single screw traverses the posterior calcaneus with adjacent bony overgrowth. No acute osseous abnormality. IMPRESSION: 1. Postoperative changes in the calcaneus without acute osseous abnormality. 2. Mild first metatarsophalangeal joint osteoarthritis. Electronically Signed   By: Lorin Picket M.D.   On: 11/01/2017 09:57   Dg Swallowing Func-speech Pathology  Result Date: 11/03/2017 Objective Swallowing Evaluation: Type of Study: MBS-Modified Barium Swallow Study  Patient Details Name: REECE FEHNEL MRN: [id number] Date of Birth: [birth year] Today's Date: 11/03/2017 Time: SLP Start Time (ACUTE ONLY): 0910 -SLP Stop Time (ACUTE ONLY): 0930 SLP Time Calculation (min) (ACUTE ONLY): 20 min Past Medical History: Past Medical History: Diagnosis Date . Diabetes mellitus without complication ([place])   Type II . Diastolic dysfunction   a. ECHO 07/2013 EF 55-60% and grade 1 diastolic dysfunction . NSTEMI (non-ST elevated myocardial infarction) ([place])   problem list 01/2014 . Rhabdomyolysis 01/2014 . Seizures ([place])  . Syncope 01/2014 . Traumatic brain injury [facility])  Past Surgical History: Past Surgical History: Procedure Laterality Date . CRANIOTOMY   . VIDEO BRONCHOSCOPY Bilateral 11/15/2017  Procedure: VIDEO BRONCHOSCOPY WITH FLUORO;  Surgeon: Collene Gobble, MD;  Location:  [facility];  Service: Cardiopulmonary;  Laterality: Bilateral; HPI: 67 yo male adm to [facility] with AMS - Pt found to have right lung mass, TBI, SDH, ETOH use, seizures, essentially wheelchair bound - s/p falls with left humerus fx and ankle injury.  Pt is s/p thoracentesis 10/29/17 and bronchoscopy 11/03/2017 with findings abnormal cells.  Swallow eval ordered.   Imaging studies showed right cerebral encephalomalacia and severe cerebellar atrophy.   Pt underwent BSE and was found to be high aspiration risk, MBS ordered.   Subjective: pt awake in chair Assessment / Plan / Recommendation CHL IP CLINICAL IMPRESSIONS 11/03/2017 Clinical Impression Pt presents with  mild oral dysphagia and intact pharyngeal swallow ability.  NO aspiration or penetration noted during MBS.  Pt demonstrates extended breath hold independently with swallowing liquids.  Difficulty masticating and transiting solid was noted - likely due to dry bolus, xerostomia and poor dentition.  He did not cough during entire MBS however and thus will follow up briefly to assure tolerance.  SLP Visit Diagnosis Dysphagia, oral phase (R13.11) Attention and concentration deficit following -- Frontal lobe and executive function deficit following -- Impact on safety and function Mild aspiration risk   CHL IP TREATMENT RECOMMENDATION 11/03/2017 Treatment Recommendations Therapy as outlined in treatment plan below   Prognosis 11/03/2017 Prognosis for Safe Diet Advancement Good Barriers to Reach Goals -- Barriers/Prognosis Comment -- CHL IP DIET RECOMMENDATION 11/03/2017 SLP Diet Recommendations Dysphagia 3 (Mech soft) solids;Thin liquid Liquid Administration via Straw Medication Administration Other (Comment) Compensations Minimize environmental distractions;Slow rate;Small sips/bites Postural Changes Remain semi-upright after after feeds/meals (Comment);Seated upright at 90 degrees   CHL IP OTHER RECOMMENDATIONS 11/03/2017 Recommended Consults -- Oral Care Recommendations Oral care QID Other Recommendations --   CHL IP FOLLOW UP RECOMMENDATIONS 10/30/2017 Follow up Recommendations Other (comment)   CHL IP FREQUENCY AND DURATION 11/03/2017 Speech Therapy Frequency (ACUTE ONLY) min 1 x/week Treatment Duration 1 week      CHL IP ORAL PHASE 11/03/2017 Oral Phase Impaired Oral - Pudding Teaspoon -- Oral - Pudding Cup -- Oral - Honey Teaspoon -- Oral - Honey Cup -- Oral - Nectar Teaspoon WFL Oral - Nectar Cup -- Oral - Nectar Straw -- Oral - Thin Teaspoon WFL Oral - Thin Cup -- Oral - Thin Straw WFL;Piecemeal swallowing Oral - Puree WFL;Piecemeal swallowing Oral - Mech Soft -- Oral - Regular Impaired mastication;Weak lingual  manipulation;Piecemeal swallowing;Delayed oral transit Oral - Multi-Consistency -- Oral - Pill WFL Oral Phase - Comment --  CHL IP PHARYNGEAL PHASE 11/03/2017 Pharyngeal Phase WFL Pharyngeal- Pudding Teaspoon -- Pharyngeal -- Pharyngeal- Pudding Cup -- Pharyngeal -- Pharyngeal- Honey Teaspoon -- Pharyngeal -- Pharyngeal- Honey Cup -- Pharyngeal -- Pharyngeal- Nectar Teaspoon -- Pharyngeal -- Pharyngeal- Nectar Cup -- Pharyngeal -- Pharyngeal- Nectar Straw -- Pharyngeal -- Pharyngeal- Thin Teaspoon -- Pharyngeal -- Pharyngeal- Thin Cup -- Pharyngeal -- Pharyngeal- Thin Straw -- Pharyngeal -- Pharyngeal- Puree -- Pharyngeal -- Pharyngeal- Mechanical Soft -- Pharyngeal -- Pharyngeal- Regular -- Pharyngeal -- Pharyngeal- Multi-consistency -- Pharyngeal -- Pharyngeal- Pill -- Pharyngeal -- Pharyngeal Comment --  CHL IP CERVICAL ESOPHAGEAL PHASE 11/03/2017 Cervical Esophageal Phase WFL Pudding Teaspoon -- Pudding Cup -- Honey Teaspoon -- Honey Cup -- Nectar Teaspoon -- Nectar Cup -- Nectar Straw -- Thin Teaspoon -- Thin Cup -- Thin Straw -- Puree -- Mechanical Soft -- Regular -- Multi-consistency -- Pill -- Cervical Esophageal Comment -- No flowsheet data found. 11/03/2017, 9:49 AM Luanna Salk, [place] [facility] SLP [phone]  CBC Recent Labs  Lab 11/07/17 0848 11/09/17 0404 11/11/17 0437 11/12/17 0353 11/13/17 0541  WBC 10.3 11.7* 11.8* 13.0* 15.2*  HGB 11.0* 10.2* 10.0* 10.7* 10.4*  HCT 35.5* 32.5* 31.2* 33.0* 32.7*  PLT 497* 587* 553* 577* 560*  MCV 81.2 80.4 78.6 78.4 78.6  MCH 25.2* 25.2* 25.2* 25.4* 25.0*  MCHC 31.0 31.4 32.1 32.4 31.8  RDW 15.1 15.0 14.6 15.0 15.2    Chemistries  Recent Labs  Lab 11/08/17 0219 11/09/17 0404 11/11/17 0253 11/12/17 0353 11/13/17 0541  NA 128* 129* 126* 126* 123*  K 4.0 4.1 4.4 4.6 3.9  CL 98* 97* 94* 92* 89*  CO2 23 24 22  20* 24  GLUCOSE 175* 202* 162* 166* 136*  BUN 6 8 8 8 8   CREATININE 0.52* 0.52* 0.51* 0.56* 0.66  CALCIUM 8.8* 8.8* 8.7* 8.8*  8.9  MG  --   --  1.7 1.8 1.7   ------------------------------------------------------------------------------------------------------------------ No results for input(s): CHOL, HDL, LDLCALC, TRIG, CHOLHDL, LDLDIRECT in the last 72 hours.  Lab Results  Component Value Date   HGBA1C 6.5 (H) 11/10/2017   ------------------------------------------------------------------------------------------------------------------ No results for input(s): TSH, T4TOTAL, T3FREE, THYROIDAB in the last 72 hours.  Invalid input(s): FREET3 ------------------------------------------------------------------------------------------------------------------ No results for input(s): VITAMINB12, FOLATE, FERRITIN, TIBC, IRON, RETICCTPCT in the last 72 hours.  Coagulation profile No results for input(s): INR, PROTIME in the last 168 hours.  No results for input(s): DDIMER in the last 72 hours.  Cardiac Enzymes No results for input(s): CKMB, TROPONINI, MYOGLOBIN in the last 168 hours.  Invalid input(s): CK ------------------------------------------------------------------------------------------------------------------    Component Value Date/Time   BNP 316.0 (H) 11/11/2017 6812     Roxan Hockey M.D on 11/13/2017 at 6:17 PM  Between 7am to 7pm - Pager - [phone]  After 7pm go to [email or web address] - password [email or web address]  [facility] -  Office  [phone]   Voice Recognition Viviann Spare dictation system was used to create this note, attempts have been made to correct errors. Please contact the author with questions and/or clarifications.

## 2017-11-13 NOTE — Progress Notes (Signed)
Patient UOP 55ml for the entire shift. Patient bladder scanned: 627ml retained in bladder. Order for in and out cath obtained from MD. UOP=630ml. Post void residual=51ml. Will continue to monitor. Lajoyce Corners, RN

## 2017-11-13 NOTE — Progress Notes (Signed)
  Echocardiogram 2D Echocardiogram has been performed.  Wyatt Salas 11/13/2017, 10:48 AM

## 2017-11-14 ENCOUNTER — Inpatient Hospital Stay (HOSPITAL_COMMUNITY): Payer: Medicare HMO

## 2017-11-14 LAB — MAGNESIUM: MAGNESIUM: 1.7 mg/dL (ref 1.7–2.4)

## 2017-11-14 LAB — BASIC METABOLIC PANEL
Anion gap: 10 (ref 5–15)
BUN: 7 mg/dL (ref 6–20)
CHLORIDE: 90 mmol/L — AB (ref 101–111)
CO2: 22 mmol/L (ref 22–32)
CREATININE: 0.55 mg/dL — AB (ref 0.61–1.24)
Calcium: 8.7 mg/dL — ABNORMAL LOW (ref 8.9–10.3)
GFR calc non Af Amer: >60 mL/min (ref >60)
GLUCOSE: 165 mg/dL — AB (ref 65–99)
Potassium: 3.6 mmol/L (ref 3.5–5.1)
Sodium: 122 mmol/L — ABNORMAL LOW (ref 135–145)

## 2017-11-14 LAB — CBC
HCT: 32.6 % — ABNORMAL LOW (ref 39.0–52.0)
Hemoglobin: 10.2 g/dL — ABNORMAL LOW (ref 13.0–17.0)
MCH: 24.6 pg — AB (ref 26.0–34.0)
MCHC: 31.3 g/dL (ref 30.0–36.0)
MCV: 78.6 fL (ref 78.0–100.0)
PLATELETS: 575 10*3/uL — AB (ref 150–400)
RBC: 4.15 MIL/uL — AB (ref 4.22–5.81)
RDW: 14.8 % (ref 11.5–15.5)
WBC: 13.2 10*3/uL — ABNORMAL HIGH (ref 4.0–10.5)

## 2017-11-14 LAB — GLUCOSE, CAPILLARY
GLUCOSE-CAPILLARY: 188 mg/dL — AB (ref 65–99)
Glucose-Capillary: 159 mg/dL — ABNORMAL HIGH (ref 65–99)
Glucose-Capillary: 136 mg/dL — ABNORMAL HIGH (ref 65–99)
Glucose-Capillary: 181 mg/dL — ABNORMAL HIGH (ref 65–99)

## 2017-11-14 MED ORDER — DIGOXIN 0.25 MG/ML IJ SOLN
0.2500 mg | Freq: Once | INTRAMUSCULAR | Status: AC
  Administered 2017-11-14: 0.25 mg via INTRAVENOUS
  Filled 2017-11-14: qty 1

## 2017-11-14 MED ORDER — DIGOXIN 125 MCG PO TABS
0.1250 mg | ORAL_TABLET | Freq: Every day | ORAL | Status: DC
  Administered 2017-11-15: 0.125 mg via ORAL
  Filled 2017-11-14: qty 1

## 2017-11-14 MED ORDER — GUAIFENESIN-CODEINE 100-10 MG/5ML PO SOLN: 10.0000 mL | Freq: Once | ORAL | Status: DC

## 2017-11-14 NOTE — Progress Notes (Signed)
Daily Progress Note   Patient Name: Wyatt Salas       Date: 11/14/2017 DOB: [birth year]  Age: 67 y.o. MRN#: [id number] Attending Physician: Roxan Hockey, MD Primary Care Physician: [name], MD Admit Date: 10/18/2017  Reason for Consultation/Follow-up: Establishing goals of care and Psychosocial/spiritual support  Subjective: I met again with Wyatt Salas today.   Reports "too blessed to be stressed" but also reports continuing to feel weaker.  We talked again about his clinical course over the past couple of weeks including his hope that he will be able to get to [place] to be [facility] a [facility] for potential treatment for his cancer.  I attempted to explore with him the fact that he continues to get weaker as well as the fact that he has poorly controlled A. fib with complication that his medications cannot be further titrated due to blood pressure.  Length of Stay: 18  Current Medications: Scheduled Meds:  . amiodarone  400 mg Oral BID  . aspirin  81 mg Oral Daily  . atorvastatin  40 mg Oral q1800  . benzonatate  200 mg Oral BID  . docusate sodium  200 mg Oral BID  . feeding supplement (ENSURE ENLIVE)  237 mL Oral BID BM  . folic acid  1 mg Oral Daily  . heparin  5,000 Units Subcutaneous Q8H  . insulin aspart  0-5 Units Subcutaneous QHS  . insulin aspart  0-9 Units Subcutaneous TID WC  . lacosamide  100 mg Oral BID  . levETIRAcetam  1,500 mg Oral BID  . metoprolol tartrate  25 mg Oral BID  . multivitamin with minerals  1 tablet Oral Daily  . phenytoin  30 mg Oral BID  . sodium chloride flush  3 mL Intravenous Q12H  . tamsulosin  0.4 mg Oral Daily  . thiamine  100 mg Oral Daily    Continuous Infusions: . sodium chloride Stopped (11/11/17 2300)    PRN  Meds: sodium chloride, acetaminophen, diltiazem, guaiFENesin-dextromethorphan, HYDROcodone-acetaminophen, ondansetron **OR** ondansetron (ZOFRAN) IV, phenol, polyethylene glycol, polyvinyl alcohol, sodium chloride flush  Physical Exam  Constitutional:  Frail and cachectic  HENT:  Temporal wasting  Neck: Normal range of motion.  Cardiovascular: Normal rate.  Pulmonary/Chest:  Becomes short of breath after speaking, dry persistent cough  Musculoskeletal: Normal range  of motion.  Neurological: He is alert.  Skin: Skin is warm and dry.  Psychiatric: Thought content normal.  Nursing note and vitals reviewed.  Vital Signs: BP 100/65 (BP Location: Right Arm)   Pulse (!) 112   Temp 98.7 F (37.1 C) (Oral)   Resp (!) 24   Ht '6\' 1"'$  (1.854 m)   Wt 65 kg (143 lb 4.8 oz)   SpO2 96%   BMI 18.91 kg/m  SpO2: SpO2: 96 % O2 Device: O2 Device: Nasal Cannula O2 Flow Rate: O2 Flow Rate (L/min): 2 L/min  Intake/output summary:   Intake/Output Summary (Last 24 hours) at 11/14/2017 0957 Last data filed at 11/14/2017 0830 Gross per 24 hour  Intake 600 ml  Output 600 ml  Net 0 ml   LBM: Last BM Date: 11/13/17 Baseline Weight: Weight: 68 kg (150 lb) Most recent weight: Weight: 65 kg (143 lb 4.8 oz)       Palliative Assessment/Data:    Flowsheet Rows     Most Recent Value  Intake Tab  Referral Department  Hospitalist  Unit at Time of Referral  Med/Surg Unit  Palliative Care Primary Diagnosis  Cancer  Date Notified  10/28/17  Palliative Care Type  New Palliative care  Reason for referral  Clarify Goals of Care  Date of Admission  11/12/2017  Date first seen by Palliative Care  10/29/17  # of days Palliative referral response time  1 Day(s)  # of days IP prior to Palliative referral  2  Clinical Assessment  Palliative Performance Scale Score  40%  Pain Max last 24 hours  6  Pain Min Last 24 hours  0  Psychosocial & Spiritual Assessment  Palliative Care Outcomes  Patient/Family  meeting held?  Yes  Who was at the meeting?  patient  Palliative Care Outcomes  Clarified goals of care      Patient Active Problem List   Diagnosis Date Noted  . 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 Atrial flutter ([place])   . Goals of care, counseling/discussion   . Palliative care by specialist   . Cardiac/pericardial tamponade   . Acute combined systolic and diastolic heart failure ([place])   . Protein-calorie malnutrition, severe 10/28/2017  . Acute urinary retention 10/27/2017  . Atrial fibrillation with rapid ventricular response ([place]) 10/27/2017  . Chronic diastolic CHF (congestive heart failure) ([place]) 10/27/2017  . Closed fracture of distal end of left humerus 10/27/2017  . Mass of upper lobe of right lung   . New onset atrial fibrillation ([place])   . Abnormal liver function   . Lung mass   . Essential hypertension   . Altered mental status 05/18/2017  . Right sided weakness 10/14/2016  . Acute urinary tract infection 10/14/2016  . Elevated troponin 10/14/2016  . Type 2 diabetes mellitus with hyperlipidemia ([place]) 10/14/2016  . Fall at home--mechanical 01/26/2014  . Elevated CK 01/26/2014  . Seizure disorder ([place]) 07/26/2013  . NSTEMI (non-ST elevated myocardial infarction) ([place]) 07/26/2013  . Hyperlipidemia 07/26/2013  . Aspiration pneumonia ([place]) 07/26/2013  . 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 fib with RVR.  Wyatt Salas has undergone bronchoscopy and unfortunately was found to have non-small cell lung cancer.  On 10/28/2017, he underwent pericardiocentesis at [facility] that shows malignant cells.   He has met with oncology and is determined to have treatment for his cancer in [place], where he has family.  Assessment: Wyatt Salas continues to verbalize that he wants to get better and be cured from his cancer and redirects attempts at goals conversation or code status discussions. Declines to have me call any family and reports that he "is relying on God to see me through."  He is able to verbalize what his care team has been telling him, but he still remains in denial that his is as ill as he is.   Recommendations/Plan:  Wyatt Salas remains pleasant throughout my encounters, but he will not engage in conversations regarding how ill he is, end of life, or code status.  He appears to me to have understanding of what he has been told regarding this, but he is in denial as to how ill he truly is and continues to shift conversation when attempting to discuss.  Continues to decline for me to call any family on his behalf.     Will continue to follow and discuss as he is emotionally willing to engage.   Wyatt Salas would qualify for his hospice benefit.  At this point however he is not open to discussing issues around end-of-life, code status. Wyatt Salas is also affiliated with the [place], who could be another source of support as this goes forward.  Goals of Care and Additional Recommendations:  Limitations on Scope of Treatment: Full Scope Treatment  Code Status:    Code Status Orders  (From admission, onward)        Start     Ordered   10/27/17 0548  Full code  Continuous     10/27/17 0549    Code Status History    Date Active Date Inactive Code Status Order ID Comments User Context   05/18/2017 2330 05/24/2017 2201 Full Code [id number]  Jani Gravel, MD Inpatient   10/14/2016 2118 10/15/2016 1921 Full Code [id number]  Toy Baker, MD Inpatient   01/25/2014 2010 01/26/2014 2035 Full Code [id number]  Corky Sox, MD Inpatient   07/27/2013 0019 07/31/2013 1533 Full Code [id number]  Orvan Falconer, MD Inpatient       Prognosis:   < 6 months in the setting of newly diagnosed non-small cell lung cancer; protein calorie malnutrition with an albumin of 2.4, BMI 16.46  Discharge Planning:  To Be Determined Depending on hospital course, this projected disposition might change.   Care plan was discussed with patient.   Thank you for allowing the Palliative Medicine Team to assist in the care of this patient.   Total Time 30 min Prolonged Time Billed  no       Greater than 50%  of this time was spent counseling and coordinating care related to the above assessment and plan.  Micheline Rough, MD [place] Team [phone]  Please contact Palliative Medicine Team phone at [phone] for questions and concerns.

## 2017-11-14 NOTE — Progress Notes (Signed)
Received phone call from bedside RN concerning recent CXR. Indicates worsening pleural effusions. Complete opacification of the right hemithorax. This is consistent with a large pleural effusion in combination with the known right lung mass as well as atelectasis. Opacity in the left lower lung consistent with a combination of a left pleural effusion with atelectasis or possibly pneumonia. Bedside assessment performed. Pt sats high 90's on 4L [place]. He states that he feels better than yesterday. Lung sounds not audible on right side. Crackles and rhonchi heard throughout left side.   Non-small cell lung carcinoma with large malignant pericardial effusion - Spoke with Dr. Denton Brick concerning chest xray results. Patient will most likely need another pericardiocentesis if condition worsens. Pt states he feels better so procedure can wait till AM. Advised RN to contact NP oncall if condition worsens throughout the night.    Lovey Newcomer, NP oncall [facility] [phone]

## 2017-11-14 NOTE — Progress Notes (Signed)
Pt still unable to void, bladder scan for 284 ml. Pt stated he is feeling pressure and requested for urinary cathter placement. MD paged. Waiting for order.

## 2017-11-14 NOTE — Progress Notes (Signed)
Patient Demographics:    Wyatt Salas, is a 67 y.o. male, DOB - 08/29/1950, KGU:[id number]  Admit date - 11/15/2017   Admitting Physician Vianne Bulls, MD  Outpatient Primary MD for the patient is [name], MD  LOS - [phone]   Chief Complaint  Patient presents with  . Fall  . Shoulder Pain  . Headache        Subjective:    Wyatt Salas today has no fevers, no emesis,  No chest pain, no dizziness or palpitations  Assessment  & Plan :    Principal Problem:   Atrial fibrillation with rapid ventricular response (HCC) Active Problems:   Seizure disorder (HCC)   Type 2 diabetes mellitus with hyperlipidemia (HCC)   Lung mass   Essential hypertension   Acute urinary retention   Chronic diastolic CHF (congestive heart failure) (HCC)   Closed fracture of distal end of left humerus   Protein-calorie malnutrition, severe   Acute combined systolic and diastolic heart failure (HCC)   Cardiac/pericardial tamponade   Goals of care, counseling/discussion   Palliative care by specialist   Atrial flutter ([place])   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  Upon admission on the chest x-ray there was concern of right upper lobe posterior mass like lesion.  His atrial fibrillation was treated with Cardizem and Lopressor.  He also developed pleural effusion during his hospital course and underwent thoracentesis on October 29, 2017.  Pleural fluid suggested of few malignant cells and then later suggested he had poorly differentiated non-small cell carcinoma.  He is also developed  pericardial effusion which is noted to be malignant and about 750 cc of this was drained.  His atrial fibrillation has remained difficult to control therefore currently is on amiodarone and Lopressor.  Cardiology is following but difficult to titrate this given his soft blood pressure.  Patient is also been seen by palliative care, patient plans to move to [place] once he can establish care with a PCP at [place] [facility]:- 1)Atrial flutter with rapid ventricular response rate; CHADScVASC 4- (age >40, HTN, DM, CHF) Patient with history of recurrent falls and prior subdural hematoma, also has history of malignant pericardial effusion, not a good candidate for anticoagulation, -Currently heart rate mostly in the 100s to 110s, cardiologist advised amiodarone to 400 mg twice daily, along with Lopressor 25 mg twice daily .  No room to uptitrate today as her blood pressure has soft.  Rate control did not improve with with calcium channel blockers and did not improve with digoxin either. 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  Repeat echocardiogram showed questionable effusion.  Will need to closely monitor this for any signs of decompensation. -he would prefer to follow-up in [place] at the  [facility] because that is where he is from. - I do not think patient is strong enough at this time for chemoradiation.  Palliative care team is following.  Repeat echo on 11/13/2017 with trivial pericardial effusion, EF 40 to 45% with akinesis of the inferior lateral wall  3)H/o Essential hypertension- -Continue Lopressor for rate control, BP has been soft lately  4)Chronic combined systolic and diastolic congestive  heart failure, class II/Coronary artery disease --Ejection fraction 40-45% with grade 2 diastolic dysfunction and diffuse hypokinesis, -Closely monitor his volume status. -Continue aspirin, statin, beta-blocker.  Not on ACE inhibitor due to concerns of low blood pressure. Repeat echo on 11/13/2017 with trivial pericardial effusion, EF 40 to 45% with akinesis of the inferior lateral wall  5)Left humerus fracture- -Secondary to fall, follow-up outpatient with Ortho  6)Diabetes mellitus type 2- -Hemoglobin A1c- 6.5, Continue Lantus and sliding scale  7)History of seizure disorder- -Continue Keppra and Dilantin  8)Urinary retention - Likely has underlying BPH- -Continue Flomax.   9)History of alcohol abuse- -No longer at risk of withdrawal as he has been here for several days. -Continue thiamine 100 mg daily, multivitamin 1 tab daily  10)Hyponatremia- -Consistent with SIADH  11)Generalized weakness and deconditioning/Moderate  protein calorie malnutrition-  -Physical therapy recommends skilled nursing facility.  At this time holding off on transitioning him to skilled nursing facility given his uncontrolled heart rate.  12)Hypoxia/Large Rt sided Pleural Effusion- for right-sided palliative thoracentesis on 11/15/2017  13)Social/Ethics-overall prognosis is grave  Disposition-   We do not have many "good" options for treating his rapid atrial flutter which is likely being driven by his underlying malignancy.   Blood pressure is too soft to titrate medicines further given his overall debilitated state and metastatic lung CA. discussed with palliative care.    Code Status: Full code  Family Communication:  called and d/w Ms Linward [place] [facility]) Disposition Plan: Maintain inpatient stay for better heart rate control.  Cardiology is following.  Once this is better to begin transitioning to skilled nursing facility.  Consultants:   Cardiology/palliative  Procedures:   Thoracentesis  4/15  Bronchoscopy 4/16   DVT Prophylaxis  :   Heparin -    Lab Results  Component Value Date   PLT 575 (H) 11/14/2017    Inpatient Medications  Scheduled Meds: . amiodarone  400 mg Oral BID  . aspirin  81 mg Oral Daily  . atorvastatin  40 mg Oral q1800  . benzonatate  200 mg Oral BID  . digoxin  0.25 mg Intravenous Once  . [START ON 11/15/2017] digoxin  0.125 mg Oral Daily  . docusate sodium  200 mg Oral BID  . feeding supplement (ENSURE ENLIVE)  237 mL Oral BID BM  . folic acid  1 mg Oral Daily  . heparin  5,000 Units Subcutaneous Q8H  . insulin aspart  0-5 Units Subcutaneous QHS  . insulin aspart  0-9 Units Subcutaneous TID WC  . lacosamide  100 mg Oral BID  . levETIRAcetam  1,500 mg Oral BID  . metoprolol tartrate  25 mg Oral BID  . multivitamin with minerals  1 tablet Oral Daily  . phenytoin  30 mg Oral BID  . sodium chloride flush  3 mL Intravenous Q12H  . tamsulosin  0.4 mg Oral Daily  . thiamine  100 mg Oral Daily   Continuous Infusions: . sodium chloride Stopped (11/11/17 2300)   PRN Meds:.sodium chloride, acetaminophen, diltiazem, guaiFENesin-dextromethorphan, HYDROcodone-acetaminophen, ondansetron **OR** ondansetron (ZOFRAN) IV, phenol, polyethylene  glycol, polyvinyl alcohol, sodium chloride flush    Anti-infectives (From admission, onward)   None        Objective:   Vitals:   11/14/17 0303 11/14/17 0800 11/14/17 1054 11/14/17 1254  BP: 93/72 100/65  (!) 103/92  Pulse: (!) 112  (!) 123   Resp: (!) 24 (!) 24  (!) 24  Temp: 97.9 F (36.6 C) 98.7 F (37.1 C)  98.7 F (37.1 C)  TempSrc: Oral Oral    SpO2: 90% 96%  96%  Weight: 65 kg (143 lb 4.8 oz)     Height:        Wt Readings from Last 3 Encounters:  11/14/17 65 kg (143 lb 4.8 oz)  09/20/17 68 kg (150 lb)  09/13/17 65.8 kg (145 lb)     Intake/Output Summary (Last 24 hours) at 11/14/2017 1820 Last data filed at 11/14/2017 0830 Gross per 24 hour  Intake 360 ml  Output 250 ml  Net 110  ml   Physical Exam Gen:- Awake Alert,  In no apparent distress  HEENT:- [place].AT, No sclera icterus Neck-Supple Neck,No JVD,.  Lungs-  CTAB , fair movement CV- S1, S2 normal, tachycardic and irregular Abd-  +ve B.Sounds, Abd Soft, No tenderness,    Extremity/Skin:-Negative Homans Psych-affect is appropriate, oriented x3 Neuro-no new focal deficits, no tremors   Data Review:   Micro Results Recent Results (from the past 240 hour(s))  Body fluid culture     Status: None   Collection Time: 10/28/2017  4:33 PM  Result Value Ref Range Status   Specimen Description PERICARDIAL [facility]  Final   Special Requests NONE  Final   Gram Stain   Final    FEW WBC PRESENT, PREDOMINANTLY PMN NO ORGANISMS SEEN    Culture   Final    NO GROWTH 3 DAYS Performed at [facility], [address] [place], [place] [id number]    Report Status 11/09/2017 FINAL  Final    Radiology Reports Ct Abdomen Pelvis Wo Contrast  Result Date: 10/30/2017 CLINICAL DATA:  Left shoulder and forehead pain after fall this morning. Lung mass. EXAM: CT CHEST, ABDOMEN, AND PELVIS WITHOUT CONTRAST TECHNIQUE: Multidetector CT imaging of the chest, abdomen and pelvis was performed without IV contrast. CONTRAST:  The patient's IV infiltrated during imaging and the order was changed to CT of the chest, abdomen and pelvis without per ordering clinician. COMPARISON:  PET-CT 05/31/2017 FINDINGS: CT CHEST FINDINGS Cardiovascular: Top-normal size heart with left main and three-vessel coronary arteriosclerosis. Aortic atherosclerosis without aneurysm. The unenhanced pulmonary vasculature is difficult to assess beyond the main pulmonary artery due to soft tissue masses and adenopathy about the right hilum and mediastinum. Mediastinum/Nodes: Interval increase in masslike abnormalities in the mediastinum and right hilum compatible with progression of metastatic adenopathy and/or increase and extension of known right upper lobe mass. Largest lymph  node identified is subcarinal at 2.8 cm. Interval increase in right upper paratracheal lymph node measuring 1.1 cm. Exact margins of additional lymph node masses are difficult to delineate on this unenhanced study. There is luminal narrowing of the distal right mainstem bronchus without occlusion secondary to the right hilar, mediastinal and subcarinal masslike abnormalities. The thyroid gland is unremarkable. The esophagus is not well visualized. Lungs/Pleura: Partially loculated small to moderate right effusion. New pulmonary nodular masslike opacities are noted within the right upper, middle and lower lobes with interval progression in size of spiculated right upper lobe mass now measuring approximately 5.8 x 5.3 x 5  cm. Within the superior segment of the aerated right lower lobe are metastatic nodules measuring up to 2.2 cm. Additional nodules are seen in the right middle and anterior right lower lobe. Left lung remains clear. Nodular thickening along the left major fissure is seen which may represent fluid in the fissure versus an intra fissural mass. This measures 2.2 x 1.2 x 1.7 cm. Peribronchial thickening is noted within both lower lobes. Musculoskeletal: Remote right posterior seventh through ninth rib fractures. No suspicious osseous lesions. Extravasated contrast noted in the soft tissues of the right arm. CT ABDOMEN PELVIS FINDINGS Hepatobiliary: No biliary dilatation. The unenhanced liver is unremarkable. The gallbladder is physiologically distended. Pancreas: No ductal dilatation or inflammation. Spleen: No splenomegaly or mass. Adrenals/Urinary Tract: No adrenal mass. Interpolar 2.5 cm simple right renal cyst. Unremarkable noncontrast appearance of the left kidney. No obstructive uropathy. Marked bladder distention to the umbilicus with the bladder measuring 14.7 x 14.1 x 18.8 cm (volume = 2040 cm^3). Stomach/Bowel: Stomach is within normal limits. Appendix appears normal. No evidence of bowel wall  thickening, distention, or inflammatory changes. Vascular/Lymphatic: Moderate aortoiliac and branch vessel atherosclerosis. No aneurysm. No adenopathy. Reproductive: Normal size prostate. Other: No free air nor free fluid. Musculoskeletal: Degenerative disc disease L5-S1. No aggressive osseous lesions. IMPRESSION: Extravasation of IV contrast into the patient's right arm. Study was flagged for contrast extravasation radiology PA follow-up should the patient be discharged. Chest CT: 1. Interval increase in spiculated right upper lobe mass now measuring approximately 5.8 x 5.3 x 5 cm with progression of metastatic mediastinal and hilar adenopathy as well as development of satellite pulmonary nodules in the right lung. Slight luminal narrowing of the distal right mainstem bronchus due to adenopathy. No occlusion noted however. 2. Ovoid density in the left major fissure may represent a pseudo lesion with fluid in the fissure versus an intra fissural mass measuring 2.2 x 1.2 x 1.7 cm. 3. Loculated moderate right pleural effusion. Abdomen and pelvic CT: 1. Marked urinary distention with volume of approximately 2 L. 2. Simple right interpolar renal cyst measuring 2.5 cm. 3. No apparent evidence of metastatic disease within the abdomen and pelvis given limitations of a noncontrast exam. Electronically Signed   By: Ashley Royalty M.D.   On: 10/28/2017 22:55   Dg Chest 2 View  Result Date: 11/01/2017 CLINICAL DATA:  Cough, history of lung cancer. EXAM: CHEST - 2 VIEW COMPARISON:  05/18/2017 chest CT, CXR 05/18/2017 FINDINGS: Interval increase in size of masslike opacity in the right upper lobe, best appreciated on the sagittal view measuring approximately 4 x 3.5 x 4.8 cm with right paratracheal and perihilar soft tissue prominence concerning for progression of lymphadenopathy. Superimposed adjacent pneumonia is difficult to entirely exclude. Hyperinflated left lung without pulmonary consolidation or dominant mass. Heart size  is top-normal. No aortic aneurysm. No suspicious osseous lesions. IMPRESSION: 1. Interval increase in size of right upper lobe posterior masslike abnormality now estimated at 4 x 3.5 x 4.8 cm, previously estimated at approximately 3.6 cm. 2. Interval increase in soft tissue prominence about the mediastinum and right hilum compatible with metastatic lymphadenopathy. Superimposed adjacent airspace opacities cannot exclude pneumonia and/or areas of atelectasis. Electronically Signed   By: Ashley Royalty M.D.   On: 10/31/2017 19:37   Dg Elbow Complete Left (3+view)  Result Date: 10/29/2017 CLINICAL DATA:  Elbow fracture. EXAM: LEFT ELBOW - COMPLETE 3+ VIEW; LEFT FOREARM - 2 VIEW COMPARISON:  Left elbow and forearm x-rays dated September 20, 2017. FINDINGS: Left  elbow: Unchanged mild cortical step-off along the medial aspect of the capitellum. No new fracture. Persistent elbow joint effusion. Osteopenia. Soft tissues are unremarkable. Left forearm: No radius or ulna fracture. No focal bone lesion. Osteopenia. Soft tissues are unremarkable. IMPRESSION: 1. Unchanged minimally displaced fracture of the capitellum. Persistent elbow joint effusion. 2. No acute abnormality of the forearm. Electronically Signed   By: Titus Dubin M.D.   On: 10/29/2017 13:17   Dg Forearm Left  Result Date: 10/29/2017 CLINICAL DATA:  Elbow fracture. EXAM: LEFT ELBOW - COMPLETE 3+ VIEW; LEFT FOREARM - 2 VIEW COMPARISON:  Left elbow and forearm x-rays dated September 20, 2017. FINDINGS: Left elbow: Unchanged mild cortical step-off along the medial aspect of the capitellum. No new fracture. Persistent elbow joint effusion. Osteopenia. Soft tissues are unremarkable. Left forearm: No radius or ulna fracture. No focal bone lesion. Osteopenia. Soft tissues are unremarkable. IMPRESSION: 1. Unchanged minimally displaced fracture of the capitellum. Persistent elbow joint effusion. 2. No acute abnormality of the forearm. Electronically Signed   By: Titus Dubin M.D.   On: 10/29/2017 13:17   Ct Head Wo Contrast  Result Date: 10/24/2017 CLINICAL DATA:  [place] out of wheelchair today. Left forehead and left shoulder pain. Initial encounter. EXAM: CT HEAD WITHOUT CONTRAST CT CERVICAL SPINE WITHOUT CONTRAST TECHNIQUE: Multidetector CT imaging of the head and cervical spine was performed following the standard protocol without intravenous contrast. Multiplanar CT image reconstructions of the cervical spine were also generated. COMPARISON:  09/21/2017 FINDINGS: CT HEAD FINDINGS Brain: Right MCA territory encephalomalacia is unchanged as is more extensive right temporal lobe encephalomalacia which may be in part postsurgical. There is marked cerebral scratched of there is marked cerebellar atrophy. Periventricular white matter hypodensities are unchanged and nonspecific but compatible with mild chronic small vessel ischemic disease. No acute large territory infarct, intracranial hemorrhage, mass, midline shift, or extra-axial fluid collection is identified. Vascular: Calcified atherosclerosis at the skull base. No hyperdense vessel. Skull: Prior right pterional craniotomy.  No acute fracture. Sinuses/Orbits: The visualized paranasal sinuses and mastoid air cells are clear. The orbits are unremarkable. Other: None. CT CERVICAL SPINE FINDINGS Alignment: Chronic reversal of the normal cervical lordosis. Unchanged grade 1 anterolisthesis of C2 on C3 and C3 on C4. Skull base and vertebrae: No acute fracture or suspicious osseous lesion. Soft tissues and spinal canal: No prevertebral fluid or swelling. No visible canal hematoma. Disc levels: Moderate diffuse cervical disc degeneration and moderate to severe left greater than right mid upper cervical facet arthrosis resulting in advanced multilevel neural foraminal stenosis. Upper chest: Right pleural effusion. Unchanged 3 mm left apical lung nodule. Other: Moderate calcified atherosclerosis at the carotid bifurcations.  IMPRESSION: 1. No evidence of acute intracranial abnormality. 2. Right cerebral hemisphere encephalomalacia. Severe cerebellar atrophy. 3. No acute cervical spine fracture. Electronically Signed   By: Logan Bores M.D.   On: 10/23/2017 19:51   Ct Chest Wo Contrast  Result Date: 11/13/2017 CLINICAL DATA:  Left shoulder and forehead pain after fall this morning. Lung mass. EXAM: CT CHEST, ABDOMEN, AND PELVIS WITHOUT CONTRAST TECHNIQUE: Multidetector CT imaging of the chest, abdomen and pelvis was performed without IV contrast. CONTRAST:  The patient's IV infiltrated during imaging and the order was changed to CT of the chest, abdomen and pelvis without per ordering clinician. COMPARISON:  PET-CT 05/31/2017 FINDINGS: CT CHEST FINDINGS Cardiovascular: Top-normal size heart with left main and three-vessel coronary arteriosclerosis. Aortic atherosclerosis without aneurysm. The unenhanced pulmonary vasculature is difficult to assess beyond the  main pulmonary artery due to soft tissue masses and adenopathy about the right hilum and mediastinum. Mediastinum/Nodes: Interval increase in masslike abnormalities in the mediastinum and right hilum compatible with progression of metastatic adenopathy and/or increase and extension of known right upper lobe mass. Largest lymph node identified is subcarinal at 2.8 cm. Interval increase in right upper paratracheal lymph node measuring 1.1 cm. Exact margins of additional lymph node masses are difficult to delineate on this unenhanced study. There is luminal narrowing of the distal right mainstem bronchus without occlusion secondary to the right hilar, mediastinal and subcarinal masslike abnormalities. The thyroid gland is unremarkable. The esophagus is not well visualized. Lungs/Pleura: Partially loculated small to moderate right effusion. New pulmonary nodular masslike opacities are noted within the right upper, middle and lower lobes with interval progression in size of spiculated  right upper lobe mass now measuring approximately 5.8 x 5.3 x 5 cm. Within the superior segment of the aerated right lower lobe are metastatic nodules measuring up to 2.2 cm. Additional nodules are seen in the right middle and anterior right lower lobe. Left lung remains clear. Nodular thickening along the left major fissure is seen which may represent fluid in the fissure versus an intra fissural mass. This measures 2.2 x 1.2 x 1.7 cm. Peribronchial thickening is noted within both lower lobes. Musculoskeletal: Remote right posterior seventh through ninth rib fractures. No suspicious osseous lesions. Extravasated contrast noted in the soft tissues of the right arm. CT ABDOMEN PELVIS FINDINGS Hepatobiliary: No biliary dilatation. The unenhanced liver is unremarkable. The gallbladder is physiologically distended. Pancreas: No ductal dilatation or inflammation. Spleen: No splenomegaly or mass. Adrenals/Urinary Tract: No adrenal mass. Interpolar 2.5 cm simple right renal cyst. Unremarkable noncontrast appearance of the left kidney. No obstructive uropathy. Marked bladder distention to the umbilicus with the bladder measuring 14.7 x 14.1 x 18.8 cm (volume = 2040 cm^3). Stomach/Bowel: Stomach is within normal limits. Appendix appears normal. No evidence of bowel wall thickening, distention, or inflammatory changes. Vascular/Lymphatic: Moderate aortoiliac and branch vessel atherosclerosis. No aneurysm. No adenopathy. Reproductive: Normal size prostate. Other: No free air nor free fluid. Musculoskeletal: Degenerative disc disease L5-S1. No aggressive osseous lesions. IMPRESSION: Extravasation of IV contrast into the patient's right arm. Study was flagged for contrast extravasation radiology PA follow-up should the patient be discharged. Chest CT: 1. Interval increase in spiculated right upper lobe mass now measuring approximately 5.8 x 5.3 x 5 cm with progression of metastatic mediastinal and hilar adenopathy as well as  development of satellite pulmonary nodules in the right lung. Slight luminal narrowing of the distal right mainstem bronchus due to adenopathy. No occlusion noted however. 2. Ovoid density in the left major fissure may represent a pseudo lesion with fluid in the fissure versus an intra fissural mass measuring 2.2 x 1.2 x 1.7 cm. 3. Loculated moderate right pleural effusion. Abdomen and pelvic CT: 1. Marked urinary distention with volume of approximately 2 L. 2. Simple right interpolar renal cyst measuring 2.5 cm. 3. No apparent evidence of metastatic disease within the abdomen and pelvis given limitations of a noncontrast exam. Electronically Signed   By: Ashley Royalty M.D.   On: 10/30/2017 22:55   Ct Cervical Spine Wo Contrast  Result Date: 11/10/2017 CLINICAL DATA:  [place] out of wheelchair today. Left forehead and left shoulder pain. Initial encounter. EXAM: CT HEAD WITHOUT CONTRAST CT CERVICAL SPINE WITHOUT CONTRAST TECHNIQUE: Multidetector CT imaging of the head and cervical spine was performed following the standard protocol without intravenous  contrast. Multiplanar CT image reconstructions of the cervical spine were also generated. COMPARISON:  09/21/2017 FINDINGS: CT HEAD FINDINGS Brain: Right MCA territory encephalomalacia is unchanged as is more extensive right temporal lobe encephalomalacia which may be in part postsurgical. There is marked cerebral scratched of there is marked cerebellar atrophy. Periventricular white matter hypodensities are unchanged and nonspecific but compatible with mild chronic small vessel ischemic disease. No acute large territory infarct, intracranial hemorrhage, mass, midline shift, or extra-axial fluid collection is identified. Vascular: Calcified atherosclerosis at the skull base. No hyperdense vessel. Skull: Prior right pterional craniotomy.  No acute fracture. Sinuses/Orbits: The visualized paranasal sinuses and mastoid air cells are clear. The orbits are unremarkable.  Other: None. CT CERVICAL SPINE FINDINGS Alignment: Chronic reversal of the normal cervical lordosis. Unchanged grade 1 anterolisthesis of C2 on C3 and C3 on C4. Skull base and vertebrae: No acute fracture or suspicious osseous lesion. Soft tissues and spinal canal: No prevertebral fluid or swelling. No visible canal hematoma. Disc levels: Moderate diffuse cervical disc degeneration and moderate to severe left greater than right mid upper cervical facet arthrosis resulting in advanced multilevel neural foraminal stenosis. Upper chest: Right pleural effusion. Unchanged 3 mm left apical lung nodule. Other: Moderate calcified atherosclerosis at the carotid bifurcations. IMPRESSION: 1. No evidence of acute intracranial abnormality. 2. Right cerebral hemisphere encephalomalacia. Severe cerebellar atrophy. 3. No acute cervical spine fracture. Electronically Signed   By: Logan Bores M.D.   On: 11/08/2017 19:51   Dg Chest Right Decubitus  Result Date: 11/08/2017 CLINICAL DATA:  Pleural effusion. EXAM: CHEST - RIGHT DECUBITUS COMPARISON:  11/07/2017 FINDINGS: Decubital radiograph, right side down demonstrates layering left pleural effusion. There is a known right pleural effusion. Poor aeration of the right lung likely due to compression, large pulmonary masses and loculated right pleural effusion. IMPRESSION: Layering left pleural effusion. Loculated right pleural effusion.  Poor aeration of the right lung. Electronically Signed   By: Fidela Salisbury M.D.   On: 11/08/2017 21:36   Dg Chest Port 1 View  Result Date: 11/07/2017 CLINICAL DATA:  Pleural effusion. EXAM: PORTABLE CHEST 1 VIEW COMPARISON:  Multiple prior studies including CT 10/25/2017 FINDINGS: The heart margins are obscured by significant opacity in the RIGHT hemithorax. A RIGHT-sided small bore pleural drain is in place, overlying the heart and RIGHT lung base. There is increased opacity in the RIGHT hemithorax, consistent with pleural effusion and  parenchymal opacity. There is volume loss in the RIGHT lung. LEFT basilar opacity partially obscures the hemidiaphragm. Multiple LEFT-sided skin folds. No pneumothorax. There are distended loops of bowel in the LEFT UPPER QUADRANT the abdomen. Remote bilateral rib fractures. IMPRESSION: 1. Increased opacity in the RIGHT lung. 2. Stable LEFT basilar opacity. Electronically Signed   By: Nolon Nations M.D.   On: 11/07/2017 10:49   Dg Chest Port 1 View  Result Date: 10/31/2017 CLINICAL DATA:  67 year old male with a history of cough and shortness of breath EXAM: PORTABLE CHEST 1 VIEW COMPARISON:  Multiple prior chest x-rays most recent 11/01/2017, 10/29/2017, 10/20/2017, prior chest CT 11/15/2017 FINDINGS: Cardiomediastinal silhouette unchanged in size and contour, with heart borders partially obscured by lung/pleural disease. Significant right rotation of the patient somewhat limits evaluation, however, there is increasing opacity at the right lung with mixed interstitial, interlobular septal opacities, and airspace opacity with evidence of pleural effusion. Increasing opacity along the upper right mediastinal border/paratracheal border. No pneumothorax. Left lung remains relatively well aerated. IMPRESSION: Increasing opacity of the right lung, likely a  combination of airspace opacity/consolidation, lung volume loss, pleural effusion, and tumor in this patient with prior cross-sectional imaging demonstrating lung mass. Given the increasing airspace opacity, differential includes endobronchial tumor/debris leading to partial volume loss. Correlation with bronchoscopy may be useful. Electronically Signed   By: Corrie Mckusick D.O.   On: 10/31/2017 13:37   Dg Chest Port 1 View  Result Date: 11/01/2017 CLINICAL DATA:  Shortness of breath EXAM: PORTABLE CHEST 1 VIEW COMPARISON:  10/29/2017 FINDINGS: Cardiac shadow is stable. The left lung remains clear. Old rib fractures on the left are noted. Large skin fold is  noted over the left mid lung. Persistent increased density is noted in the right upper lobe consistent with the known underlying mass lesion. Patchy infiltrative changes are noted in the right middle lobe as well. Old rib fractures on the right are seen. IMPRESSION: Stable right upper lobe mass lesion with associated patchy infiltrates similar to that noted on prior CT examination. No large pleural effusion is seen. Electronically Signed   By: Inez Catalina M.D.   On: 11/01/2017 09:53   Dg Chest Port 1 View  Result Date: 10/29/2017 CLINICAL DATA:  Status post thoracentesis. EXAM: PORTABLE CHEST 1 VIEW COMPARISON:  Chest radiograph 11/01/2017 FINDINGS: Negative for pneumothorax following thoracentesis. There may be a small amount of right pleural fluid present. Extensive airspace densities throughout the right lung particularly in the perihilar regions. The degree of right lung consolidation may have slightly increased. Left lung remains clear. Heart size is grossly stable. Old right rib fractures. IMPRESSION: Negative for pneumothorax following thoracentesis. Extensive airspace disease and consolidation in the right lung and cannot exclude progression since prior examination. Electronically Signed   By: Markus Daft M.D.   On: 10/29/2017 10:23   Dg Shoulder Left  Result Date: 11/03/2017 CLINICAL DATA:  [place] striking the left side today. Recent distal humeral fracture. EXAM: LEFT SHOULDER - 2+ VIEW COMPARISON:  Left humerus series of Nov 17, 2009 FINDINGS: There is chronic deformity of the distal clavicle. No acute clavicular fracture is observed. The scapula and proximal humerus appear normal. There is narrowing of the glenohumeral joint space. IMPRESSION: There is no acute fracture or dislocation of the left shoulder. 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 There is chronic deformity of the distal clavicle. Electronically Signed   By: David  Martinique M.D.   On: 10/21/2017 11:20   Dg Foot  Complete Left  Result Date: 11/01/2017 CLINICAL DATA:  Left heel pain. EXAM: LEFT FOOT - COMPLETE 3+ VIEW COMPARISON:  09/21/2017. FINDINGS: Osteopenia. Mild degenerative changes at the first metatarsophalangeal joint with hallux valgus. Single screw traverses the posterior calcaneus with adjacent bony overgrowth. No acute osseous abnormality. IMPRESSION: 1. Postoperative changes in the calcaneus without acute osseous abnormality. 2. Mild first metatarsophalangeal joint osteoarthritis. Electronically Signed   By: Lorin Picket M.D.   On: 11/01/2017 09:57   Dg Swallowing Func-speech Pathology  Result Date: 11/03/2017 Objective Swallowing Evaluation: Type of Study: MBS-Modified Barium Swallow Study  Patient Details Name: DACIAN ORRICO MRN: [id number] Date of Birth: 01-02-1951 Today's Date: 11/03/2017 Time: SLP Start Time (ACUTE ONLY): 0910 -SLP Stop Time (ACUTE ONLY): 0930 SLP Time Calculation (min) (ACUTE ONLY): 20 min Past Medical History: Past Medical History: Diagnosis Date . Diabetes mellitus without complication ([place])   Type II . Diastolic dysfunction   a. ECHO 07/2013 EF 55-60% and grade 1 diastolic dysfunction . NSTEMI (non-ST elevated myocardial infarction) ([place])   problem list 01/2014 . Rhabdomyolysis 01/2014 .  Seizures ([place])  . Syncope 01/2014 . Traumatic brain injury [facility])  Past Surgical History: Past Surgical History: Procedure Laterality Date . CRANIOTOMY   . VIDEO BRONCHOSCOPY Bilateral 11/01/2017  Procedure: VIDEO BRONCHOSCOPY WITH FLUORO;  Surgeon: Collene Gobble, MD;  Location: [facility];  Service: Cardiopulmonary;  Laterality: Bilateral; HPI: 67 yo male adm to [facility] with AMS - Pt found to have right lung mass, TBI, SDH, ETOH use, seizures, essentially wheelchair bound - s/p falls with left humerus fx and ankle injury.  Pt is s/p thoracentesis 10/29/17 and bronchoscopy 11/13/2017 with findings abnormal cells.  Swallow eval ordered.   Imaging studies showed right cerebral encephalomalacia and  severe cerebellar atrophy.   Pt underwent BSE and was found to be high aspiration risk, MBS ordered.   Subjective: pt awake in chair Assessment / Plan / Recommendation CHL IP CLINICAL IMPRESSIONS 11/03/2017 Clinical Impression Pt presents with mild oral dysphagia and intact pharyngeal swallow ability.  NO aspiration or penetration noted during MBS.  Pt demonstrates extended breath hold independently with swallowing liquids.  Difficulty masticating and transiting solid was noted - likely due to dry bolus, xerostomia and poor dentition.  He did not cough during entire MBS however and thus will follow up briefly to assure tolerance.  SLP Visit Diagnosis Dysphagia, oral phase (R13.11) Attention and concentration deficit following -- Frontal lobe and executive function deficit following -- Impact on safety and function Mild aspiration risk   CHL IP TREATMENT RECOMMENDATION 11/03/2017 Treatment Recommendations Therapy as outlined in treatment plan below   Prognosis 11/03/2017 Prognosis for Safe Diet Advancement Good Barriers to Reach Goals -- Barriers/Prognosis Comment -- CHL IP DIET RECOMMENDATION 11/03/2017 SLP Diet Recommendations Dysphagia 3 (Mech soft) solids;Thin liquid Liquid Administration via Straw Medication Administration Other (Comment) Compensations Minimize environmental distractions;Slow rate;Small sips/bites Postural Changes Remain semi-upright after after feeds/meals (Comment);Seated upright at 90 degrees   CHL IP OTHER RECOMMENDATIONS 11/03/2017 Recommended Consults -- Oral Care Recommendations Oral care QID Other Recommendations --   CHL IP FOLLOW UP RECOMMENDATIONS 10/31/2017 Follow up Recommendations Other (comment)   CHL IP FREQUENCY AND DURATION 11/03/2017 Speech Therapy Frequency (ACUTE ONLY) min 1 x/week Treatment Duration 1 week      CHL IP ORAL PHASE 11/03/2017 Oral Phase Impaired Oral - Pudding Teaspoon -- Oral - Pudding Cup -- Oral - Honey Teaspoon -- Oral - Honey Cup -- Oral - Nectar Teaspoon WFL  Oral - Nectar Cup -- Oral - Nectar Straw -- Oral - Thin Teaspoon WFL Oral - Thin Cup -- Oral - Thin Straw WFL;Piecemeal swallowing Oral - Puree WFL;Piecemeal swallowing Oral - Mech Soft -- Oral - Regular Impaired mastication;Weak lingual manipulation;Piecemeal swallowing;Delayed oral transit Oral - Multi-Consistency -- Oral - Pill WFL Oral Phase - Comment --  CHL IP PHARYNGEAL PHASE 11/03/2017 Pharyngeal Phase WFL Pharyngeal- Pudding Teaspoon -- Pharyngeal -- Pharyngeal- Pudding Cup -- Pharyngeal -- Pharyngeal- Honey Teaspoon -- Pharyngeal -- Pharyngeal- Honey Cup -- Pharyngeal -- Pharyngeal- Nectar Teaspoon -- Pharyngeal -- Pharyngeal- Nectar Cup -- Pharyngeal -- Pharyngeal- Nectar Straw -- Pharyngeal -- Pharyngeal- Thin Teaspoon -- Pharyngeal -- Pharyngeal- Thin Cup -- Pharyngeal -- Pharyngeal- Thin Straw -- Pharyngeal -- Pharyngeal- Puree -- Pharyngeal -- Pharyngeal- Mechanical Soft -- Pharyngeal -- Pharyngeal- Regular -- Pharyngeal -- Pharyngeal- Multi-consistency -- Pharyngeal -- Pharyngeal- Pill -- Pharyngeal -- Pharyngeal Comment --  CHL IP CERVICAL ESOPHAGEAL PHASE 11/03/2017 Cervical Esophageal Phase WFL Pudding Teaspoon -- Pudding Cup -- Honey Teaspoon -- Honey Cup -- Nectar Teaspoon -- Nectar Cup -- Nectar Straw --  Thin Teaspoon -- Thin Cup -- Thin Straw -- Puree -- Mechanical Soft -- Regular -- Multi-consistency -- Pill -- Cervical Esophageal Comment -- No flowsheet data found. 11/03/2017, 9:49 AM Luanna Salk, MS [facility] SLP [phone]                CBC Recent Labs  Lab 11/09/17 0404 11/11/17 0437 11/12/17 0353 11/13/17 0541 11/14/17 0249  WBC 11.7* 11.8* 13.0* 15.2* 13.2*  HGB 10.2* 10.0* 10.7* 10.4* 10.2*  HCT 32.5* 31.2* 33.0* 32.7* 32.6*  PLT 587* 553* 577* 560* 575*  MCV 80.4 78.6 78.4 78.6 78.6  MCH 25.2* 25.2* 25.4* 25.0* 24.6*  MCHC 31.4 32.1 32.4 31.8 31.3  RDW 15.0 14.6 15.0 15.2 14.8    Chemistries  Recent Labs  Lab 11/09/17 0404 11/11/17 0253 11/12/17 0353 11/13/17 0541  11/14/17 0249  NA 129* 126* 126* 123* 122*  K 4.1 4.4 4.6 3.9 3.6  CL 97* 94* 92* 89* 90*  CO2 24 22 20* 24 22  GLUCOSE 202* 162* 166* 136* 165*  BUN 8 8 8 8 7   CREATININE 0.52* 0.51* 0.56* 0.66 0.55*  CALCIUM 8.8* 8.7* 8.8* 8.9 8.7*  MG  --  1.7 1.8 1.7 1.7   ------------------------------------------------------------------------------------------------------------------ No results for input(s): CHOL, HDL, LDLCALC, TRIG, CHOLHDL, LDLDIRECT in the last 72 hours.  Lab Results  Component Value Date   HGBA1C 6.5 (H) 11/10/2017   ------------------------------------------------------------------------------------------------------------------ No results for input(s): TSH, T4TOTAL, T3FREE, THYROIDAB in the last 72 hours.  Invalid input(s): FREET3 ------------------------------------------------------------------------------------------------------------------ No results for input(s): VITAMINB12, FOLATE, FERRITIN, TIBC, IRON, RETICCTPCT in the last 72 hours.  Coagulation profile No results for input(s): INR, PROTIME in the last 168 hours.  No results for input(s): DDIMER in the last 72 hours.  Cardiac Enzymes No results for input(s): CKMB, TROPONINI, MYOGLOBIN in the last 168 hours.  Invalid input(s): CK ------------------------------------------------------------------------------------------------------------------    Component Value Date/Time   BNP 316.0 (H) 11/11/2017 7680     Roxan Hockey M.D on 11/14/2017 at 6:20 PM  Between 7am to 7pm - Pager - [phone]  After 7pm go to [email or web address] - password [email or web address]  [facility] -  Office  [phone]   Voice Recognition Viviann Spare dictation system was used to create this note, attempts have been made to correct errors. Please contact the author with questions and/or clarifications.

## 2017-11-14 NOTE — Plan of Care (Signed)
Care plans reviewed and patient is progressing.  

## 2017-11-15 ENCOUNTER — Other Ambulatory Visit: Payer: Medicare HMO

## 2017-11-15 ENCOUNTER — Ambulatory Visit: Payer: Medicare HMO | Admitting: Internal Medicine

## 2017-11-15 ENCOUNTER — Encounter (HOSPITAL_COMMUNITY): Payer: Self-pay | Admitting: Diagnostic Radiology

## 2017-11-15 ENCOUNTER — Inpatient Hospital Stay (HOSPITAL_COMMUNITY): Payer: Medicare HMO

## 2017-11-15 HISTORY — PX: IR THORACENTESIS ASP PLEURAL SPACE W/IMG GUIDE: IMG5380

## 2017-11-15 LAB — CBC
HCT: 32.3 % — ABNORMAL LOW (ref 39.0–52.0)
Hemoglobin: 10.4 g/dL — ABNORMAL LOW (ref 13.0–17.0)
MCH: 24.9 pg — ABNORMAL LOW (ref 26.0–34.0)
MCHC: 32.2 g/dL (ref 30.0–36.0)
MCV: 77.3 fL — ABNORMAL LOW (ref 78.0–100.0)
PLATELETS: 531 10*3/uL — AB (ref 150–400)
RBC: 4.18 MIL/uL — ABNORMAL LOW (ref 4.22–5.81)
RDW: 14.7 % (ref 11.5–15.5)
WBC: 13.9 10*3/uL — AB (ref 4.0–10.5)

## 2017-11-15 LAB — MAGNESIUM: MAGNESIUM: 1.9 mg/dL (ref 1.7–2.4)

## 2017-11-15 LAB — BASIC METABOLIC PANEL
Anion gap: 7 (ref 5–15)
BUN: 10 mg/dL (ref 6–20)
CO2: 24 mmol/L (ref 22–32)
CREATININE: 0.54 mg/dL — AB (ref 0.61–1.24)
Calcium: 9 mg/dL (ref 8.9–10.3)
Chloride: 93 mmol/L — ABNORMAL LOW (ref 101–111)
GFR calc Af Amer: >60 mL/min (ref >60)
GFR calc non Af Amer: >60 mL/min (ref >60)
Glucose, Bld: 152 mg/dL — ABNORMAL HIGH (ref 65–99)
Potassium: 3.8 mmol/L (ref 3.5–5.1)
SODIUM: 124 mmol/L — AB (ref 135–145)

## 2017-11-15 LAB — GLUCOSE, CAPILLARY
GLUCOSE-CAPILLARY: 140 mg/dL — AB (ref 65–99)
Glucose-Capillary: 213 mg/dL — ABNORMAL HIGH (ref 65–99)
Glucose-Capillary: 163 mg/dL — ABNORMAL HIGH (ref 65–99)
Glucose-Capillary: 147 mg/dL — ABNORMAL HIGH (ref 65–99)

## 2017-11-15 MED ORDER — LIDOCAINE HCL 1 % IJ SOLN
INTRAMUSCULAR | Status: AC
  Filled 2017-11-15: qty 20

## 2017-11-15 NOTE — Progress Notes (Signed)
Pt returned from thoracentesis. BP 93/66, 98% on RA. Rhonchi ascultated in all lung fields, course air sounds in throat, pt more sleepy. MD paged, orders received. Will continue to monitor.  Clyde Canterbury, RN

## 2017-11-15 NOTE — Progress Notes (Signed)
Occupational Therapy Treatment Patient Details Name: Wyatt Salas MRN: [id number] DOB: [birth year] Today's Date: 11/15/2017    History of present illness Pt is a 67 y.o. male with history of multiple falls, remote subdural hematoma status post right-sided craniotomy, seizure disorder, hepatitis C, hyperlipidemia, chronic diastolic congestive heart failure, alcohol abuse, lung cancer was admitted to the hospital concerning for atrial fibrillation with RVR. Pt with recent L distal humerus fracture and per orthopedics WBAT L UE with no ROM restrictions.    OT comments  Pt with significantly limited activity tolerance for ADL participation this session. Did not address OOB mobility this session per RN request as pt preparing for transport to IR. Facilitated improved functional use of L UE with AROM and PROM activities as detailed below to maximize functional use of L UE during ADL tasks. Pt is demonstrating much improved control of L UE movement at this time. Noted increased work of breathing with SpO2 desaturation during bed level exercises to 83% with quick rebound to >98% with rest break and pursed lip breathing techniques. Original goals remain appropriate and updated timeline accordingly. Will continue to follow while admitted.    Follow Up Recommendations  SNF    Equipment Recommendations  3 in 1 bedside commode    Recommendations for Other Services      Precautions / Restrictions Precautions Precautions: Fall Precaution Comments: Per Dr [name], ROM OK to tolerance, WBAT Required Braces or Orthoses: Other Brace/Splint Other Brace/Splint: L wrist cock-up splint       Mobility Bed Mobility               General bed mobility comments: Did not address as pt preparing for transport to IR per RN.   Transfers                 General transfer comment: NT this session    Balance                                           ADL either performed or assessed  with clinical judgement   ADL Overall ADL's : Needs assistance/impaired                                       General ADL Comments: Session focused on L UE functional activities/exercises to improve use of L UE during ADL tasks.      Vision       Perception     Praxis      Cognition Arousal/Alertness: Awake/alert Behavior During Therapy: WFL for tasks assessed/performed Overall Cognitive Status: Impaired/Different from baseline Area of Impairment: Problem solving;Attention                 Orientation Level: Disoriented to;Situation Current Attention Level: Selective         Problem Solving: Slow processing General Comments: Pt following commands. Noted decreased understanding of situation today.         Exercises Exercises: General Upper Extremity General Exercises - Upper Extremity Elbow Flexion: PROM;Left;10 reps;Seated Elbow Extension: PROM;Left;10 reps;Seated Wrist Flexion: Left;10 reps;Seated;AAROM Wrist Extension: AROM;Left;10 reps;Seated(min assist to isolate wrist movement) Digit Composite Flexion: AROM;Left;Seated;10 reps Composite Extension: AROM;Left;Seated;10 reps   Shoulder Instructions       General Comments Noted pt with increased work of breathing during bed level  therapeutic exercises today. Pt with noted "crackling" during breathing.     Pertinent Vitals/ Pain       Pain Assessment: Faces Faces Pain Scale: Hurts little more Pain Location: L elbow with PROM Pain Descriptors / Indicators: Grimacing;Tender;Burning Pain Intervention(s): Limited activity within patient's tolerance;Monitored during session;Repositioned  Home Living                                          Prior Functioning/Environment              Frequency  Min 2X/week        Progress Toward Goals  OT Goals(current goals can now be found in the care plan section)  Progress towards OT goals: Not progressing toward goals -  comment(decreased activity tolerance; goals remain appropriate)  Acute Rehab OT Goals Patient Stated Goal: none stated OT Goal Formulation: With patient Time For Goal Achievement: 11/12/17 Potential to Achieve Goals: Good  Plan Discharge plan remains appropriate    Co-evaluation                 AM-PAC PT "6 Clicks" Daily Activity     Outcome Measure   Help from another person eating meals?: A Little Help from another person taking care of personal grooming?: A Little Help from another person toileting, which includes using toliet, bedpan, or urinal?: A Lot Help from another person bathing (including washing, rinsing, drying)?: A Lot Help from another person to put on and taking off regular upper body clothing?: A Little Help from another person to put on and taking off regular lower body clothing?: A Lot 6 Click Score: 15    End of Session Equipment Utilized During Treatment: Oxygen;Other (comment)(L wrist cock-up splint)  OT Visit Diagnosis: Muscle weakness (generalized) (M62.81);Pain Pain - Right/Left: Left Pain - part of body: Arm;Leg   Activity Tolerance Patient tolerated treatment well   Patient Left in chair;with call bell/phone within reach;with chair alarm set   Nurse Communication Mobility status        Time: 2257-5051 OT Time Calculation (min): 15 min  Charges: OT General Charges $OT Visit: 1 Visit OT Treatments $Therapeutic Exercise: 8-22 mins  Norman Herrlich, MS OTR/L  Pager: [place] 11/15/2017, 3:43 PM

## 2017-11-15 NOTE — Procedures (Signed)
US guided right thoracentesis.  Removed 1.6 liters of amber colored fluid.  No immediate complication and minimal blood loss.  Right lung looks non-aerated by [place] after the thoracentesis and suspect central airway obstruction, will get CXR.

## 2017-11-15 NOTE — Progress Notes (Signed)
Progress Note  Patient Name: Wyatt Salas Date of Encounter: 11/15/2017  Primary Cardiologist: Glenetta Hew, MD   Subjective   Ill appearing, mild to moderately increased work of breathing, laying in bed, family at bedside daughter I believe.  No chest pain.  Inpatient Medications    Scheduled Meds: . amiodarone  400 mg Oral BID  . aspirin  81 mg Oral Daily  . atorvastatin  40 mg Oral q1800  . benzonatate  200 mg Oral BID  . digoxin  0.125 mg Oral Daily  . docusate sodium  200 mg Oral BID  . feeding supplement (ENSURE ENLIVE)  237 mL Oral BID BM  . folic acid  1 mg Oral Daily  . guaiFENesin-codeine  10 mL Oral Once  . heparin  5,000 Units Subcutaneous Q8H  . insulin aspart  0-5 Units Subcutaneous QHS  . insulin aspart  0-9 Units Subcutaneous TID WC  . lacosamide  100 mg Oral BID  . levETIRAcetam  1,500 mg Oral BID  . metoprolol tartrate  25 mg Oral BID  . multivitamin with minerals  1 tablet Oral Daily  . phenytoin  30 mg Oral BID  . sodium chloride flush  3 mL Intravenous Q12H  . tamsulosin  0.4 mg Oral Daily  . thiamine  100 mg Oral Daily   Continuous Infusions: . sodium chloride Stopped (11/11/17 2300)   PRN Meds: sodium chloride, acetaminophen, diltiazem, guaiFENesin-dextromethorphan, HYDROcodone-acetaminophen, ondansetron **OR** ondansetron (ZOFRAN) IV, phenol, polyethylene glycol, polyvinyl alcohol, sodium chloride flush   Vital Signs    Vitals:   11/14/17 1900 11/15/17 0040 11/15/17 0432 11/15/17 0735  BP: 107/83 101/76 115/86 104/64  Pulse: (!) 108 (!) 105 (!) 103 (!) 113  Resp: (!) 25 (!) 21 (!) 28 17  Temp: 98.3 F (36.8 C) 97.8 F (36.6 C) 98 F (36.7 C) 97.7 F (36.5 C)  TempSrc: Oral Oral Oral Oral  SpO2: 99% 100% 98% 100%  Weight:   140 lb 3.4 oz (63.6 kg)   Height:        Intake/Output Summary (Last 24 hours) at 11/15/2017 0903 Last data filed at 11/15/2017 0407 Gross per 24 hour  Intake 510 ml  Output 550 ml  Net -40 ml   Filed  Weights   11/13/17 0416 11/14/17 0303 11/15/17 0432  Weight: 143 lb 15.4 oz (65.3 kg) 143 lb 4.8 oz (65 kg) 140 lb 3.4 oz (63.6 kg)    Telemetry    Atrial fibrillation heart rates between 100-110- Personally Reviewed  ECG    No new EKG- Personally Reviewed  Physical Exam   GEN: No acute distress.   Thin, cachectic, ill-appearing Neck: No JVD Cardiac: RRR, no murmurs, rubs, or gallops.  Respiratory: No breath sounds right side, increased respiratory effort GI: Soft, nontender, non-distended  MS: No edema; No deformity.  Legs elevated Neuro:  Nonfocal  Psych: Normal affect   Labs    Chemistry Recent Labs  Lab 11/13/17 0541 11/14/17 0249 11/15/17 0323  NA 123* 122* 124*  K 3.9 3.6 3.8  CL 89* 90* 93*  CO2 24 22 24   GLUCOSE 136* 165* 152*  BUN 8 7 10   CREATININE 0.66 0.55* 0.54*  CALCIUM 8.9 8.7* 9.0  GFRNONAA >60 >60 >60  GFRAA >60 >60 >60  ANIONGAP 10 10 7      Hematology Recent Labs  Lab 11/13/17 0541 11/14/17 0249 11/15/17 0323  WBC 15.2* 13.2* 13.9*  RBC 4.16* 4.15* 4.18*  HGB 10.4* 10.2* 10.4*  HCT 32.7*  32.6* 32.3*  MCV 78.6 78.6 77.3*  MCH 25.0* 24.6* 24.9*  MCHC 31.8 31.3 32.2  RDW 15.2 14.8 14.7  PLT 560* 575* 531*    Cardiac EnzymesNo results for input(s): TROPONINI in the last 168 hours. No results for input(s): TROPIPOC in the last 168 hours.   BNP Recent Labs  Lab 11/11/17 0931  BNP 316.0*     DDimer No results for input(s): DDIMER in the last 168 hours.   Radiology    Dg Chest Port 1 View  Result Date: 11/14/2017 CLINICAL DATA:  Dyspnea. EXAM: PORTABLE CHEST 1 VIEW COMPARISON:  11/08/2017 and older exams. FINDINGS: There is complete opacification of the right hemithorax. Hazy airspace consolidation is noted in the left lower lung. The left hemidiaphragm is obscured. This appears mildly increased from the most recent prior exams. No pneumothorax. IMPRESSION: 1. Complete opacification of the right hemithorax. This is consistent with  a large pleural effusion in combination with the known right lung mass as well as atelectasis. 2. Opacity in the left lower lung consistent with a combination of a left pleural effusion with atelectasis or possibly pneumonia. This has mildly increased from the prior exam. Electronically Signed   By: Lajean Manes M.D.   On: 11/14/2017 19:09    Cardiac Studies   Limited echocardiogram 11/24/17 post pericardiocentesis - Left ventricle: The cavity size was normal. Wall thickness was   increased in a pattern of mild LVH. Systolic function was mildly   to moderately reduced. The estimated ejection fraction was in the   range of 40% to 45%. Wall motion was normal; there were no   regional wall motion abnormalities. - Mitral valve: There was moderate regurgitation. - Left atrium: The atrium was moderately dilated. - Pulmonary arteries: PA peak pressure: 37 mm Hg (S). - Pericardium, extracardiac: A trivial pericardial effusion was   identified. There was a left pleural effusion.  Patient Profile     67 y.o. male stage IV, metastatic non-small cell lung cancer, pericardial mets, severe right-sided lung lesions resulting in pleural effusion/opacification of hemithorax with hypotension, difficult to control atrial fibrillation  Assessment & Plan    Persistent atrial fibrillation - Continue with amiodarone given his persistent hypotension.  Currently on 400 mg twice daily.  He is also on metoprolol 25 mg twice a day as well as digoxin 125 mcg once a day. - Not on anticoagulation because of current underlying medical condition, falls, risk of bleeding. - 400 mg p.o. twice a day was started on 11/10/2017, currently day 5.  On day 7, 11/17/17, would recommend decreasing to 200 mg p.o. twice a day for 7 days, and then continue after that with 200 mg once a day.  Metastatic non-small cell lung cancer - Palliative care note reviewed.  Malignant pericardial effusion - Pericardiocentesis 10/31/2017- remains  trivial posterior pericardial effusion.  If fluid were to reaccumulate, could consider pericardial window versus escalation of palliative care measures/comfort measures.  Malignant right pleural effusion -Most recent x-ray shows complete opacification of the right hemithorax.  Per primary team.  Per family, going down for thoracentesis.  No new cardiac recommendations at this time.  We will sign off.  Please let us know if we can be of further assistance.  For questions or updates, please contact [place] Please consult [email or web address] for contact info under Cardiology/STEMI.      Signed, Candee Furbish, MD  11/15/2017, 9:03 AM

## 2017-11-15 NOTE — Progress Notes (Signed)
Patient Demographics:    Wyatt Salas, is a 67 y.o. male, DOB - 03/21/1951, EHM:[id number]  Admit date - 10/27/2017   Admitting Physician Vianne Bulls, MD  Outpatient Primary MD for the patient is [name], MD  LOS - [phone]   Chief Complaint  Patient presents with  . Fall  . Shoulder Pain  . Headache        Subjective:    Wyatt Salas today has no fevers, no emesis, more short of breath, tachypneic,  Assessment  & Plan :    Principal Problem:   Atrial fibrillation with rapid ventricular response (HCC) Active Problems:   Seizure disorder (HCC)   Type 2 diabetes mellitus with hyperlipidemia (HCC)   Lung mass   Essential hypertension   Acute urinary retention   Chronic diastolic CHF (congestive heart failure) (HCC)   Closed fracture of distal end of left humerus   Protein-calorie malnutrition, severe   Acute combined systolic and diastolic heart failure (HCC)   Cardiac/pericardial tamponade   Goals of care, counseling/discussion   Palliative care by specialist   Atrial flutter ([place])   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  Upon admission on the chest x-ray there was concern of right upper lobe posterior mass like lesion.  His atrial fibrillation was treated with Cardizem and Lopressor.  He also developed pleural effusion during his hospital course and underwent thoracentesis on October 29, 2017.  Pleural fluid suggested of few malignant cells and then later suggested he had poorly differentiated non-small cell carcinoma.  He is also developed pericardial  effusion which is noted to be malignant and about 750 cc of this was drained.  His atrial fibrillation has remained difficult to control therefore currently is on amiodarone and Lopressor.  Cardiology is following but difficult to titrate this given his soft blood pressure.  Patient is also been seen by palliative care, patient plans to move to [place] once he can establish care with a PCP at [place] [facility] .  Overall prognosis is grave, patient unlikely to be able to go to [place] given his declining health   Plan:- 1)Atrial flutter with rapid ventricular response rate; CHADScVASC 4- (age >7, HTN, DM, CHF) Patient with history of recurrent falls and prior subdural hematoma, also has history of malignant pericardial effusion, not a good candidate for anticoagulation, -Currently heart rate mostly in the 100s to 110s, cardiologist advised amiodarone to 400 mg twice daily, on 11/17/2017 we will switch to amiodarone 400 mg daily through 11/23/2017 then decrease to amiodarone 200 mg daily indefinitely, continue Lopressor 25 mg twice daily .  No room to uptitrate today as her blood pressure has soft.  Rate control did not improve with with calcium channel blockers and did not improve with digoxin either.  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  Palliative care team is following.  Repeat echo on 11/13/2017 with trivial pericardial effusion, EF 40 to 45% with akinesis of the inferior lateral wall  3)H/o Essential hypertension- -Continue  Lopressor for rate control, BP has been soft lately  4)Chronic combined systolic and diastolic congestive heart failure, class II/Coronary artery disease --Ejection fraction 40-45% with grade 2 diastolic dysfunction and diffuse hypokinesis, -Closely monitor his volume status. -Continue aspirin, statin, beta-blocker.  Not on ACE inhibitor due to concerns of low blood pressure. Repeat echo on 11/13/2017 with trivial pericardial effusion, EF 40 to 45% with akinesis of the inferior lateral wall  5)Left humerus fracture- -Secondary to fall, follow-up outpatient with Ortho  6)Diabetes mellitus type 2- -Hemoglobin A1c- 6.5, Continue Lantus and sliding scale  7)History of seizure disorder- -Continue Keppra and Dilantin  8)Urinary retention - Likely has underlying BPH- -Continue Flomax.   9)History of alcohol abuse- -No longer at risk of withdrawal as he has been here for several days. -Continue thiamine 100 mg daily, multivitamin 1 tab daily  10)Hyponatremia- -Consistent with SIADH  11)Generalized weakness and deconditioning/Moderate  protein calorie malnutrition-  -Physical therapy recommends skilled nursing facility.  At this time holding off on transitioning him to skilled nursing facility given his uncontrolled heart rate.  12)Hypoxia/Large Rt sided Pleural Effusion- for right-sided palliative thoracentesis on 11/15/2017  13)Social/Ethics-overall prognosis is grave  Disposition-   We do not have many "good" options for treating his rapid atrial flutter which is likely being driven by his underlying malignancy.   Blood pressure is too soft to titrate medicines further given his overall debilitated state and metastatic lung CA. discussed with palliative care.    Code Status: Full code  Family Communication:  called and d/w Ms Linward [place]land [facility]) Disposition Plan:    Residential hospice House versus skilled nursing facility  Consultants:   Cardiology/palliative  Procedures:    Thoracentesis 4/15  Bronchoscopy 4/16 Rt sided Thoracentensis 11/15/17  DVT Prophylaxis  :   Heparin -    Lab Results  Component Value Date   PLT 531 (H) 11/15/2017    Inpatient Medications  Scheduled Meds: . amiodarone  400 mg Oral BID  . aspirin  81 mg Oral Daily  . atorvastatin  40 mg Oral q1800  . benzonatate  200 mg Oral BID  . digoxin  0.125 mg Oral Daily  . docusate sodium  200 mg Oral BID  . feeding supplement (ENSURE ENLIVE)  237 mL Oral BID BM  . folic acid  1 mg Oral Daily  . guaiFENesin-codeine  10 mL Oral Once  . heparin  5,000 Units Subcutaneous Q8H  . insulin aspart  0-5 Units Subcutaneous QHS  . insulin aspart  0-9 Units Subcutaneous TID WC  . lacosamide  100 mg Oral BID  . levETIRAcetam  1,500 mg Oral BID  . lidocaine      . metoprolol tartrate  25 mg Oral BID  . multivitamin with minerals  1 tablet Oral Daily  . phenytoin  30 mg Oral BID  . sodium chloride flush  3 mL Intravenous Q12H  . tamsulosin  0.4 mg Oral Daily  . thiamine  100 mg Oral Daily   Continuous Infusions: . sodium chloride Stopped (11/11/17 2300)   PRN Meds:.sodium chloride, acetaminophen, diltiazem, guaiFENesin-dextromethorphan, HYDROcodone-acetaminophen, ondansetron **OR**  ondansetron (ZOFRAN) IV, phenol, polyethylene glycol, polyvinyl alcohol, sodium chloride flush    Anti-infectives (From admission, onward)   None       Objective:   Vitals:   11/15/17 0432 11/15/17 0735 11/15/17 0945 11/15/17 1210  BP: 115/86 104/64  107/72  Pulse: (!) 103 (!) 113 (!) 110   Resp: (!) 28 17  19   Temp: 98 F (36.7 C) 97.7 F (36.5 C)  97.7 F (36.5 C)  TempSrc: Oral Oral  Oral  SpO2: 98% 100%  100%  Weight: 63.6 kg (140 lb 3.4 oz)     Height:        Wt Readings from Last 3 Encounters:  11/15/17 63.6 kg (140 lb 3.4 oz)  09/20/17 68 kg (150 lb)  09/13/17 65.8 kg (145 lb)     Intake/Output Summary (Last 24 hours) at 11/15/2017 1810 Last data filed at 11/15/2017 0407 Gross per  24 hour  Intake 120 ml  Output 250 ml  Net -130 ml   Physical Exam Gen:- Awake Alert, mild respiratory distress HEENT:- [place].AT, No sclera icterus Neck-Supple Neck,No JVD,.  Lungs-  , significantly diminished on the right, tachypnea noted CV- S1, S2 normal, tachycardic and irregular Abd-  +ve B.Sounds, Abd Soft, No tenderness,    Extremity/Skin:-Negative Homans Psych-affect is appropriate, oriented x3 Neuro-no new focal deficits, no tremors   Data Review:   Micro Results No results found for this or any previous visit (from the past 240 hour(s)).  Radiology Reports Ct Abdomen Pelvis Wo Contrast  Result Date: 11/15/2017 CLINICAL DATA:  Left shoulder and forehead pain after fall this morning. Lung mass. EXAM: CT CHEST, ABDOMEN, AND PELVIS WITHOUT CONTRAST TECHNIQUE: Multidetector CT imaging of the chest, abdomen and pelvis was performed without IV contrast. CONTRAST:  The patient's IV infiltrated during imaging and the order was changed to CT of the chest, abdomen and pelvis without per ordering clinician. COMPARISON:  PET-CT 05/31/2017 FINDINGS: CT CHEST FINDINGS Cardiovascular: Top-normal size heart with left main and three-vessel coronary arteriosclerosis. Aortic atherosclerosis without aneurysm. The unenhanced pulmonary vasculature is difficult to assess beyond the main pulmonary artery due to soft tissue masses and adenopathy about the right hilum and mediastinum. Mediastinum/Nodes: Interval increase in masslike abnormalities in the mediastinum and right hilum compatible with progression of metastatic adenopathy and/or increase and extension of known right upper lobe mass. Largest lymph node identified is subcarinal at 2.8 cm. Interval increase in right upper paratracheal lymph node measuring 1.1 cm. Exact margins of additional lymph node masses are difficult to delineate on this unenhanced study. There is luminal narrowing of the distal right mainstem bronchus without occlusion secondary to  the right hilar, mediastinal and subcarinal masslike abnormalities. The thyroid gland is unremarkable. The esophagus is not well visualized. Lungs/Pleura: Partially loculated small to moderate right effusion. New pulmonary nodular masslike opacities are noted within the right upper, middle and lower lobes with interval progression in size of spiculated right upper lobe mass now measuring approximately 5.8 x 5.3 x 5 cm. Within the superior segment of the aerated right lower lobe are metastatic nodules measuring up to 2.2 cm. Additional nodules are seen in the right middle and anterior right lower lobe. Left lung remains clear. Nodular thickening along the left major fissure is seen which may represent fluid in the fissure versus an intra fissural mass. This measures 2.2 x 1.2 x 1.7 cm. Peribronchial thickening is noted within both lower lobes. Musculoskeletal: Remote right posterior seventh through ninth rib fractures. No suspicious osseous  lesions. Extravasated contrast noted in the soft tissues of the right arm. CT ABDOMEN PELVIS FINDINGS Hepatobiliary: No biliary dilatation. The unenhanced liver is unremarkable. The gallbladder is physiologically distended. Pancreas: No ductal dilatation or inflammation. Spleen: No splenomegaly or mass. Adrenals/Urinary Tract: No adrenal mass. Interpolar 2.5 cm simple right renal cyst. Unremarkable noncontrast appearance of the left kidney. No obstructive uropathy. Marked bladder distention to the umbilicus with the bladder measuring 14.7 x 14.1 x 18.8 cm (volume = 2040 cm^3). Stomach/Bowel: Stomach is within normal limits. Appendix appears normal. No evidence of bowel wall thickening, distention, or inflammatory changes. Vascular/Lymphatic: Moderate aortoiliac and branch vessel atherosclerosis. No aneurysm. No adenopathy. Reproductive: Normal size prostate. Other: No free air nor free fluid. Musculoskeletal: Degenerative disc disease L5-S1. No aggressive osseous lesions.  IMPRESSION: Extravasation of IV contrast into the patient's right arm. Study was flagged for contrast extravasation radiology PA follow-up should the patient be discharged. Chest CT: 1. Interval increase in spiculated right upper lobe mass now measuring approximately 5.8 x 5.3 x 5 cm with progression of metastatic mediastinal and hilar adenopathy as well as development of satellite pulmonary nodules in the right lung. Slight luminal narrowing of the distal right mainstem bronchus due to adenopathy. No occlusion noted however. 2. Ovoid density in the left major fissure may represent a pseudo lesion with fluid in the fissure versus an intra fissural mass measuring 2.2 x 1.2 x 1.7 cm. 3. Loculated moderate right pleural effusion. Abdomen and pelvic CT: 1. Marked urinary distention with volume of approximately 2 L. 2. Simple right interpolar renal cyst measuring 2.5 cm. 3. No apparent evidence of metastatic disease within the abdomen and pelvis given limitations of a noncontrast exam. Electronically Signed   By: Ashley Royalty M.D.   On: 11/13/2017 22:55   Dg Chest 2 View  Result Date: 10/21/2017 CLINICAL DATA:  Cough, history of lung cancer. EXAM: CHEST - 2 VIEW COMPARISON:  05/18/2017 chest CT, CXR 05/18/2017 FINDINGS: Interval increase in size of masslike opacity in the right upper lobe, best appreciated on the sagittal view measuring approximately 4 x 3.5 x 4.8 cm with right paratracheal and perihilar soft tissue prominence concerning for progression of lymphadenopathy. Superimposed adjacent pneumonia is difficult to entirely exclude. Hyperinflated left lung without pulmonary consolidation or dominant mass. Heart size is top-normal. No aortic aneurysm. No suspicious osseous lesions. IMPRESSION: 1. Interval increase in size of right upper lobe posterior masslike abnormality now estimated at 4 x 3.5 x 4.8 cm, previously estimated at approximately 3.6 cm. 2. Interval increase in soft tissue prominence about the  mediastinum and right hilum compatible with metastatic lymphadenopathy. Superimposed adjacent airspace opacities cannot exclude pneumonia and/or areas of atelectasis. Electronically Signed   By: Ashley Royalty M.D.   On: 10/27/2017 19:37   Dg Elbow Complete Left (3+view)  Result Date: 10/29/2017 CLINICAL DATA:  Elbow fracture. EXAM: LEFT ELBOW - COMPLETE 3+ VIEW; LEFT FOREARM - 2 VIEW COMPARISON:  Left elbow and forearm x-rays dated September 20, 2017. FINDINGS: Left elbow: Unchanged mild cortical step-off along the medial aspect of the capitellum. No new fracture. Persistent elbow joint effusion. Osteopenia. Soft tissues are unremarkable. Left forearm: No radius or ulna fracture. No focal bone lesion. Osteopenia. Soft tissues are unremarkable. IMPRESSION: 1. Unchanged minimally displaced fracture of the capitellum. Persistent elbow joint effusion. 2. No acute abnormality of the forearm. Electronically Signed   By: Titus Dubin M.D.   On: 10/29/2017 13:17   Dg Forearm Left  Result Date: 10/29/2017 CLINICAL DATA:  Elbow fracture. EXAM: LEFT ELBOW - COMPLETE 3+ VIEW; LEFT FOREARM - 2 VIEW COMPARISON:  Left elbow and forearm x-rays dated September 20, 2017. FINDINGS: Left elbow: Unchanged mild cortical step-off along the medial aspect of the capitellum. No new fracture. Persistent elbow joint effusion. Osteopenia. Soft tissues are unremarkable. Left forearm: No radius or ulna fracture. No focal bone lesion. Osteopenia. Soft tissues are unremarkable. IMPRESSION: 1. Unchanged minimally displaced fracture of the capitellum. Persistent elbow joint effusion. 2. No acute abnormality of the forearm. Electronically Signed   By: Titus Dubin M.D.   On: 10/29/2017 13:17   Ct Head Wo Contrast  Result Date: 11/01/2017 CLINICAL DATA:  [place] out of wheelchair today. Left forehead and left shoulder pain. Initial encounter. EXAM: CT HEAD WITHOUT CONTRAST CT CERVICAL SPINE WITHOUT CONTRAST TECHNIQUE: Multidetector CT imaging of the  head and cervical spine was performed following the standard protocol without intravenous contrast. Multiplanar CT image reconstructions of the cervical spine were also generated. COMPARISON:  09/21/2017 FINDINGS: CT HEAD FINDINGS Brain: Right MCA territory encephalomalacia is unchanged as is more extensive right temporal lobe encephalomalacia which may be in part postsurgical. There is marked cerebral scratched of there is marked cerebellar atrophy. Periventricular white matter hypodensities are unchanged and nonspecific but compatible with mild chronic small vessel ischemic disease. No acute large territory infarct, intracranial hemorrhage, mass, midline shift, or extra-axial fluid collection is identified. Vascular: Calcified atherosclerosis at the skull base. No hyperdense vessel. Skull: Prior right pterional craniotomy.  No acute fracture. Sinuses/Orbits: The visualized paranasal sinuses and mastoid air cells are clear. The orbits are unremarkable. Other: None. CT CERVICAL SPINE FINDINGS Alignment: Chronic reversal of the normal cervical lordosis. Unchanged grade 1 anterolisthesis of C2 on C3 and C3 on C4. Skull base and vertebrae: No acute fracture or suspicious osseous lesion. Soft tissues and spinal canal: No prevertebral fluid or swelling. No visible canal hematoma. Disc levels: Moderate diffuse cervical disc degeneration and moderate to severe left greater than right mid upper cervical facet arthrosis resulting in advanced multilevel neural foraminal stenosis. Upper chest: Right pleural effusion. Unchanged 3 mm left apical lung nodule. Other: Moderate calcified atherosclerosis at the carotid bifurcations. IMPRESSION: 1. No evidence of acute intracranial abnormality. 2. Right cerebral hemisphere encephalomalacia. Severe cerebellar atrophy. 3. No acute cervical spine fracture. Electronically Signed   By: Logan Bores M.D.   On: 11/03/2017 19:51   Ct Chest Wo Contrast  Result Date: 10/25/2017 CLINICAL  DATA:  Left shoulder and forehead pain after fall this morning. Lung mass. EXAM: CT CHEST, ABDOMEN, AND PELVIS WITHOUT CONTRAST TECHNIQUE: Multidetector CT imaging of the chest, abdomen and pelvis was performed without IV contrast. CONTRAST:  The patient's IV infiltrated during imaging and the order was changed to CT of the chest, abdomen and pelvis without per ordering clinician. COMPARISON:  PET-CT 05/31/2017 FINDINGS: CT CHEST FINDINGS Cardiovascular: Top-normal size heart with left main and three-vessel coronary arteriosclerosis. Aortic atherosclerosis without aneurysm. The unenhanced pulmonary vasculature is difficult to assess beyond the main pulmonary artery due to soft tissue masses and adenopathy about the right hilum and mediastinum. Mediastinum/Nodes: Interval increase in masslike abnormalities in the mediastinum and right hilum compatible with progression of metastatic adenopathy and/or increase and extension of known right upper lobe mass. Largest lymph node identified is subcarinal at 2.8 cm. Interval increase in right upper paratracheal lymph node measuring 1.1 cm. Exact margins of additional lymph node masses are difficult to delineate on this unenhanced study. There is luminal narrowing of the distal  right mainstem bronchus without occlusion secondary to the right hilar, mediastinal and subcarinal masslike abnormalities. The thyroid gland is unremarkable. The esophagus is not well visualized. Lungs/Pleura: Partially loculated small to moderate right effusion. New pulmonary nodular masslike opacities are noted within the right upper, middle and lower lobes with interval progression in size of spiculated right upper lobe mass now measuring approximately 5.8 x 5.3 x 5 cm. Within the superior segment of the aerated right lower lobe are metastatic nodules measuring up to 2.2 cm. Additional nodules are seen in the right middle and anterior right lower lobe. Left lung remains clear. Nodular thickening  along the left major fissure is seen which may represent fluid in the fissure versus an intra fissural mass. This measures 2.2 x 1.2 x 1.7 cm. Peribronchial thickening is noted within both lower lobes. Musculoskeletal: Remote right posterior seventh through ninth rib fractures. No suspicious osseous lesions. Extravasated contrast noted in the soft tissues of the right arm. CT ABDOMEN PELVIS FINDINGS Hepatobiliary: No biliary dilatation. The unenhanced liver is unremarkable. The gallbladder is physiologically distended. Pancreas: No ductal dilatation or inflammation. Spleen: No splenomegaly or mass. Adrenals/Urinary Tract: No adrenal mass. Interpolar 2.5 cm simple right renal cyst. Unremarkable noncontrast appearance of the left kidney. No obstructive uropathy. Marked bladder distention to the umbilicus with the bladder measuring 14.7 x 14.1 x 18.8 cm (volume = 2040 cm^3). Stomach/Bowel: Stomach is within normal limits. Appendix appears normal. No evidence of bowel wall thickening, distention, or inflammatory changes. Vascular/Lymphatic: Moderate aortoiliac and branch vessel atherosclerosis. No aneurysm. No adenopathy. Reproductive: Normal size prostate. Other: No free air nor free fluid. Musculoskeletal: Degenerative disc disease L5-S1. No aggressive osseous lesions. IMPRESSION: Extravasation of IV contrast into the patient's right arm. Study was flagged for contrast extravasation radiology PA follow-up should the patient be discharged. Chest CT: 1. Interval increase in spiculated right upper lobe mass now measuring approximately 5.8 x 5.3 x 5 cm with progression of metastatic mediastinal and hilar adenopathy as well as development of satellite pulmonary nodules in the right lung. Slight luminal narrowing of the distal right mainstem bronchus due to adenopathy. No occlusion noted however. 2. Ovoid density in the left major fissure may represent a pseudo lesion with fluid in the fissure versus an intra fissural mass  measuring 2.2 x 1.2 x 1.7 cm. 3. Loculated moderate right pleural effusion. Abdomen and pelvic CT: 1. Marked urinary distention with volume of approximately 2 L. 2. Simple right interpolar renal cyst measuring 2.5 cm. 3. No apparent evidence of metastatic disease within the abdomen and pelvis given limitations of a noncontrast exam. Electronically Signed   By: Ashley Royalty M.D.   On: 11/15/2017 22:55   Ct Cervical Spine Wo Contrast  Result Date: 10/20/2017 CLINICAL DATA:  [place] out of wheelchair today. Left forehead and left shoulder pain. Initial encounter. EXAM: CT HEAD WITHOUT CONTRAST CT CERVICAL SPINE WITHOUT CONTRAST TECHNIQUE: Multidetector CT imaging of the head and cervical spine was performed following the standard protocol without intravenous contrast. Multiplanar CT image reconstructions of the cervical spine were also generated. COMPARISON:  09/21/2017 FINDINGS: CT HEAD FINDINGS Brain: Right MCA territory encephalomalacia is unchanged as is more extensive right temporal lobe encephalomalacia which may be in part postsurgical. There is marked cerebral scratched of there is marked cerebellar atrophy. Periventricular white matter hypodensities are unchanged and nonspecific but compatible with mild chronic small vessel ischemic disease. No acute large territory infarct, intracranial hemorrhage, mass, midline shift, or extra-axial fluid collection is identified. Vascular: Calcified  atherosclerosis at the skull base. No hyperdense vessel. Skull: Prior right pterional craniotomy.  No acute fracture. Sinuses/Orbits: The visualized paranasal sinuses and mastoid air cells are clear. The orbits are unremarkable. Other: None. CT CERVICAL SPINE FINDINGS Alignment: Chronic reversal of the normal cervical lordosis. Unchanged grade 1 anterolisthesis of C2 on C3 and C3 on C4. Skull base and vertebrae: No acute fracture or suspicious osseous lesion. Soft tissues and spinal canal: No prevertebral fluid or swelling. No  visible canal hematoma. Disc levels: Moderate diffuse cervical disc degeneration and moderate to severe left greater than right mid upper cervical facet arthrosis resulting in advanced multilevel neural foraminal stenosis. Upper chest: Right pleural effusion. Unchanged 3 mm left apical lung nodule. Other: Moderate calcified atherosclerosis at the carotid bifurcations. IMPRESSION: 1. No evidence of acute intracranial abnormality. 2. Right cerebral hemisphere encephalomalacia. Severe cerebellar atrophy. 3. No acute cervical spine fracture. Electronically Signed   By: Logan Bores M.D.   On: 10/29/2017 19:51   Dg Chest Right Decubitus  Result Date: 11/08/2017 CLINICAL DATA:  Pleural effusion. EXAM: CHEST - RIGHT DECUBITUS COMPARISON:  11/07/2017 FINDINGS: Decubital radiograph, right side down demonstrates layering left pleural effusion. There is a known right pleural effusion. Poor aeration of the right lung likely due to compression, large pulmonary masses and loculated right pleural effusion. IMPRESSION: Layering left pleural effusion. Loculated right pleural effusion.  Poor aeration of the right lung. Electronically Signed   By: Fidela Salisbury M.D.   On: 11/08/2017 21:36   Dg Chest Port 1 View  Result Date: 11/14/2017 CLINICAL DATA:  Dyspnea. EXAM: PORTABLE CHEST 1 VIEW COMPARISON:  11/08/2017 and older exams. FINDINGS: There is complete opacification of the right hemithorax. Hazy airspace consolidation is noted in the left lower lung. The left hemidiaphragm is obscured. This appears mildly increased from the most recent prior exams. No pneumothorax. IMPRESSION: 1. Complete opacification of the right hemithorax. This is consistent with a large pleural effusion in combination with the known right lung mass as well as atelectasis. 2. Opacity in the left lower lung consistent with a combination of a left pleural effusion with atelectasis or possibly pneumonia. This has mildly increased from the prior exam.  Electronically Signed   By: Lajean Manes M.D.   On: 11/14/2017 19:09   Dg Chest Port 1 View  Result Date: 11/07/2017 CLINICAL DATA:  Pleural effusion. EXAM: PORTABLE CHEST 1 VIEW COMPARISON:  Multiple prior studies including CT 10/25/2017 FINDINGS: The heart margins are obscured by significant opacity in the RIGHT hemithorax. A RIGHT-sided small bore pleural drain is in place, overlying the heart and RIGHT lung base. There is increased opacity in the RIGHT hemithorax, consistent with pleural effusion and parenchymal opacity. There is volume loss in the RIGHT lung. LEFT basilar opacity partially obscures the hemidiaphragm. Multiple LEFT-sided skin folds. No pneumothorax. There are distended loops of bowel in the LEFT UPPER QUADRANT the abdomen. Remote bilateral rib fractures. IMPRESSION: 1. Increased opacity in the RIGHT lung. 2. Stable LEFT basilar opacity. Electronically Signed   By: Nolon Nations M.D.   On: 11/07/2017 10:49   Dg Chest Port 1 View  Result Date: 10/22/2017 CLINICAL DATA:  67 year old male with a history of cough and shortness of breath EXAM: PORTABLE CHEST 1 VIEW COMPARISON:  Multiple prior chest x-rays most recent 11/01/2017, 10/29/2017, 11/15/2017, prior chest CT 10/25/2017 FINDINGS: Cardiomediastinal silhouette unchanged in size and contour, with heart borders partially obscured by lung/pleural disease. Significant right rotation of the patient somewhat limits evaluation, however, there  is increasing opacity at the right lung with mixed interstitial, interlobular septal opacities, and airspace opacity with evidence of pleural effusion. Increasing opacity along the upper right mediastinal border/paratracheal border. No pneumothorax. Left lung remains relatively well aerated. IMPRESSION: Increasing opacity of the right lung, likely a combination of airspace opacity/consolidation, lung volume loss, pleural effusion, and tumor in this patient with prior cross-sectional imaging  demonstrating lung mass. Given the increasing airspace opacity, differential includes endobronchial tumor/debris leading to partial volume loss. Correlation with bronchoscopy may be useful. Electronically Signed   By: Corrie Mckusick D.O.   On: 11/13/2017 13:37   Dg Chest Port 1 View  Result Date: 11/01/2017 CLINICAL DATA:  Shortness of breath EXAM: PORTABLE CHEST 1 VIEW COMPARISON:  10/29/2017 FINDINGS: Cardiac shadow is stable. The left lung remains clear. Old rib fractures on the left are noted. Large skin fold is noted over the left mid lung. Persistent increased density is noted in the right upper lobe consistent with the known underlying mass lesion. Patchy infiltrative changes are noted in the right middle lobe as well. Old rib fractures on the right are seen. IMPRESSION: Stable right upper lobe mass lesion with associated patchy infiltrates similar to that noted on prior CT examination. No large pleural effusion is seen. Electronically Signed   By: Inez Catalina M.D.   On: 11/01/2017 09:53   Dg Chest Port 1 View  Result Date: 10/29/2017 CLINICAL DATA:  Status post thoracentesis. EXAM: PORTABLE CHEST 1 VIEW COMPARISON:  Chest radiograph 11/01/2017 FINDINGS: Negative for pneumothorax following thoracentesis. There may be a small amount of right pleural fluid present. Extensive airspace densities throughout the right lung particularly in the perihilar regions. The degree of right lung consolidation may have slightly increased. Left lung remains clear. Heart size is grossly stable. Old right rib fractures. IMPRESSION: Negative for pneumothorax following thoracentesis. Extensive airspace disease and consolidation in the right lung and cannot exclude progression since prior examination. Electronically Signed   By: Markus Daft M.D.   On: 10/29/2017 10:23   Dg Shoulder Left  Result Date: 10/21/2017 CLINICAL DATA:  [place] striking the left side today. Recent distal humeral fracture. EXAM: LEFT SHOULDER - 2+ VIEW  COMPARISON:  Left humerus series of Nov 17, 2009 FINDINGS: There is chronic deformity of the distal clavicle. No acute clavicular fracture is observed. The scapula and proximal humerus appear normal. There is narrowing of the glenohumeral joint space. IMPRESSION: There is no acute fracture or dislocation of the left shoulder. 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 There is chronic deformity of the distal clavicle. Electronically Signed   By: David  Martinique M.D.   On: 10/25/2017 11:20   Dg Foot Complete Left  Result Date: 11/01/2017 CLINICAL DATA:  Left heel pain. EXAM: LEFT FOOT - COMPLETE 3+ VIEW COMPARISON:  09/21/2017. FINDINGS: Osteopenia. Mild degenerative changes at the first metatarsophalangeal joint with hallux valgus. Single screw traverses the posterior calcaneus with adjacent bony overgrowth. No acute osseous abnormality. IMPRESSION: 1. Postoperative changes in the calcaneus without acute osseous abnormality. 2. Mild first metatarsophalangeal joint osteoarthritis. Electronically Signed   By: Lorin Picket M.D.   On: 11/01/2017 09:57   Dg Swallowing Func-speech Pathology  Result Date: 11/03/2017 Objective Swallowing Evaluation: Type of Study: MBS-Modified Barium Swallow Study  Patient Details Name: LEAM MADERO MRN: [id number] Date of Birth: 02-03-1951 Today's Date: 11/03/2017 Time: SLP Start Time (ACUTE ONLY): 0910 -SLP Stop Time (ACUTE ONLY): 0930 SLP Time Calculation (min) (ACUTE ONLY): 20 min Past  Medical History: Past Medical History: Diagnosis Date . Diabetes mellitus without complication ([place])   Type II . Diastolic dysfunction   a. ECHO 07/2013 EF 55-60% and grade 1 diastolic dysfunction . NSTEMI (non-ST elevated myocardial infarction) ([place])   problem list 01/2014 . Rhabdomyolysis 01/2014 . Seizures ([place])  . Syncope 01/2014 . Traumatic brain injury [facility])  Past Surgical History: Past Surgical History: Procedure Laterality Date . CRANIOTOMY   . VIDEO BRONCHOSCOPY  Bilateral 11/08/2017  Procedure: VIDEO BRONCHOSCOPY WITH FLUORO;  Surgeon: Collene Gobble, MD;  Location: [facility];  Service: Cardiopulmonary;  Laterality: Bilateral; HPI: 67 yo male adm to [facility] with AMS - Pt found to have right lung mass, TBI, SDH, ETOH use, seizures, essentially wheelchair bound - s/p falls with left humerus fx and ankle injury.  Pt is s/p thoracentesis 10/29/17 and bronchoscopy 11/10/2017 with findings abnormal cells.  Swallow eval ordered.   Imaging studies showed right cerebral encephalomalacia and severe cerebellar atrophy.   Pt underwent BSE and was found to be high aspiration risk, MBS ordered.   Subjective: pt awake in chair Assessment / Plan / Recommendation CHL IP CLINICAL IMPRESSIONS 11/03/2017 Clinical Impression Pt presents with mild oral dysphagia and intact pharyngeal swallow ability.  NO aspiration or penetration noted during MBS.  Pt demonstrates extended breath hold independently with swallowing liquids.  Difficulty masticating and transiting solid was noted - likely due to dry bolus, xerostomia and poor dentition.  He did not cough during entire MBS however and thus will follow up briefly to assure tolerance.  SLP Visit Diagnosis Dysphagia, oral phase (R13.11) Attention and concentration deficit following -- Frontal lobe and executive function deficit following -- Impact on safety and function Mild aspiration risk   CHL IP TREATMENT RECOMMENDATION 11/03/2017 Treatment Recommendations Therapy as outlined in treatment plan below   Prognosis 11/03/2017 Prognosis for Safe Diet Advancement Good Barriers to Reach Goals -- Barriers/Prognosis Comment -- CHL IP DIET RECOMMENDATION 11/03/2017 SLP Diet Recommendations Dysphagia 3 (Mech soft) solids;Thin liquid Liquid Administration via Straw Medication Administration Other (Comment) Compensations Minimize environmental distractions;Slow rate;Small sips/bites Postural Changes Remain semi-upright after after feeds/meals (Comment);Seated upright  at 90 degrees   CHL IP OTHER RECOMMENDATIONS 11/03/2017 Recommended Consults -- Oral Care Recommendations Oral care QID Other Recommendations --   CHL IP FOLLOW UP RECOMMENDATIONS 10/23/2017 Follow up Recommendations Other (comment)   CHL IP FREQUENCY AND DURATION 11/03/2017 Speech Therapy Frequency (ACUTE ONLY) min 1 x/week Treatment Duration 1 week      CHL IP ORAL PHASE 11/03/2017 Oral Phase Impaired Oral - Pudding Teaspoon -- Oral - Pudding Cup -- Oral - Honey Teaspoon -- Oral - Honey Cup -- Oral - Nectar Teaspoon WFL Oral - Nectar Cup -- Oral - Nectar Straw -- Oral - Thin Teaspoon WFL Oral - Thin Cup -- Oral - Thin Straw WFL;Piecemeal swallowing Oral - Puree WFL;Piecemeal swallowing Oral - Mech Soft -- Oral - Regular Impaired mastication;Weak lingual manipulation;Piecemeal swallowing;Delayed oral transit Oral - Multi-Consistency -- Oral - Pill WFL Oral Phase - Comment --  CHL IP PHARYNGEAL PHASE 11/03/2017 Pharyngeal Phase WFL Pharyngeal- Pudding Teaspoon -- Pharyngeal -- Pharyngeal- Pudding Cup -- Pharyngeal -- Pharyngeal- Honey Teaspoon -- Pharyngeal -- Pharyngeal- Honey Cup -- Pharyngeal -- Pharyngeal- Nectar Teaspoon -- Pharyngeal -- Pharyngeal- Nectar Cup -- Pharyngeal -- Pharyngeal- Nectar Straw -- Pharyngeal -- Pharyngeal- Thin Teaspoon -- Pharyngeal -- Pharyngeal- Thin Cup -- Pharyngeal -- Pharyngeal- Thin Straw -- Pharyngeal -- Pharyngeal- Puree -- Pharyngeal -- Pharyngeal- Mechanical Soft -- Pharyngeal -- Pharyngeal- Regular --  Pharyngeal -- Pharyngeal- Multi-consistency -- Pharyngeal -- Pharyngeal- Pill -- Pharyngeal -- Pharyngeal Comment --  CHL IP CERVICAL ESOPHAGEAL PHASE 11/03/2017 Cervical Esophageal Phase WFL Pudding Teaspoon -- Pudding Cup -- Honey Teaspoon -- Honey Cup -- Nectar Teaspoon -- Nectar Cup -- Nectar Straw -- Thin Teaspoon -- Thin Cup -- Thin Straw -- Puree -- Mechanical Soft -- Regular -- Multi-consistency -- Pill -- Cervical Esophageal Comment -- No flowsheet data found. 11/03/2017,  9:49 AM Luanna Salk, MS [facility] SLP [phone]                CBC Recent Labs  Lab 11/11/17 [phone] 11/12/17 0353 11/13/17 0541 11/14/17 0249 11/15/17 0323  WBC 11.8* 13.0* 15.2* 13.2* 13.9*  HGB 10.0* 10.7* 10.4* 10.2* 10.4*  HCT 31.2* 33.0* 32.7* 32.6* 32.3*  PLT 553* 577* 560* 575* 531*  MCV 78.6 78.4 78.6 78.6 77.3*  MCH 25.2* 25.4* 25.0* 24.6* 24.9*  MCHC 32.1 32.4 31.8 31.3 32.2  RDW 14.6 15.0 15.2 14.8 14.7    Chemistries  Recent Labs  Lab 11/11/17 0253 11/12/17 0353 11/13/17 0541 11/14/17 0249 11/15/17 0323  NA 126* 126* 123* 122* 124*  K 4.4 4.6 3.9 3.6 3.8  CL 94* 92* 89* 90* 93*  CO2 22 20* 24 22 24   GLUCOSE 162* 166* 136* 165* 152*  BUN 8 8 8 7 10   CREATININE 0.51* 0.56* 0.66 0.55* 0.54*  CALCIUM 8.7* 8.8* 8.9 8.7* 9.0  MG 1.7 1.8 1.7 1.7 1.9   ------------------------------------------------------------------------------------------------------------------ No results for input(s): CHOL, HDL, LDLCALC, TRIG, CHOLHDL, LDLDIRECT in the last 72 hours.  Lab Results  Component Value Date   HGBA1C 6.5 (H) 11/10/2017   ------------------------------------------------------------------------------------------------------------------ No results for input(s): TSH, T4TOTAL, T3FREE, THYROIDAB in the last 72 hours.  Invalid input(s): FREET3 ------------------------------------------------------------------------------------------------------------------ No results for input(s): VITAMINB12, FOLATE, FERRITIN, TIBC, IRON, RETICCTPCT in the last 72 hours.  Coagulation profile No results for input(s): INR, PROTIME in the last 168 hours.  No results for input(s): DDIMER in the last 72 hours.  Cardiac Enzymes No results for input(s): CKMB, TROPONINI, MYOGLOBIN in the last 168 hours.  Invalid input(s): CK ------------------------------------------------------------------------------------------------------------------    Component Value Date/Time   BNP 316.0 (H)  11/11/2017 6283     Roxan Hockey M.D on 11/15/2017 at 6:10 PM  Between 7am to 7pm - Pager - [phone]  After 7pm go to [email or web address] - password [email or web address]  [facility] -  Office  [phone]   Voice Recognition Viviann Spare dictation system was used to create this note, attempts have been made to correct errors. Please contact the author with questions and/or clarifications.

## 2017-11-15 NOTE — Progress Notes (Signed)
PT Cancellation Note  Patient Details Name: Wyatt Salas MRN: [id number] DOB: 07-15-1951   Cancelled Treatment:    Reason Eval/Treat Not Completed: Patient declined, no reason specified. Pt did not feel up to working with therapy at this time, will check back later as time allows.    [place] 11/15/2017, 11:13 AM

## 2017-11-15 NOTE — Progress Notes (Signed)
Daily Progress Note   Patient Name: Wyatt Salas       Date: 11/15/2017 DOB: Apr 16, 1951  Age: 67 y.o. MRN#: [id number] Attending Physician: Roxan Hockey, MD Primary Care Physician: [name], MD Admit Date: 10/21/2017  Reason for Consultation/Follow-up: Establishing goals of care and Psychosocial/spiritual support  Subjective: I met again with Wyatt Salas today.   Reports "too blessed to be stressed" but continues to grow weaker.  We discussed earlier conversation with Dr. Denton Brick and the fact that he continues to get weaker and has poorly controlled A. fib with complication that his medications cannot be further titrated due to blood pressure.  He   Length of Stay: 19  Current Medications: Scheduled Meds:  . amiodarone  400 mg Oral BID  . aspirin  81 mg Oral Daily  . atorvastatin  40 mg Oral q1800  . benzonatate  200 mg Oral BID  . digoxin  0.125 mg Oral Daily  . docusate sodium  200 mg Oral BID  . feeding supplement (ENSURE ENLIVE)  237 mL Oral BID BM  . folic acid  1 mg Oral Daily  . guaiFENesin-codeine  10 mL Oral Once  . heparin  5,000 Units Subcutaneous Q8H  . insulin aspart  0-5 Units Subcutaneous QHS  . insulin aspart  0-9 Units Subcutaneous TID WC  . lacosamide  100 mg Oral BID  . levETIRAcetam  1,500 mg Oral BID  . lidocaine      . metoprolol tartrate  25 mg Oral BID  . multivitamin with minerals  1 tablet Oral Daily  . phenytoin  30 mg Oral BID  . sodium chloride flush  3 mL Intravenous Q12H  . tamsulosin  0.4 mg Oral Daily  . thiamine  100 mg Oral Daily    Continuous Infusions: . sodium chloride Stopped (11/11/17 2300)    PRN Meds: sodium chloride, acetaminophen, diltiazem, guaiFENesin-dextromethorphan, HYDROcodone-acetaminophen, ondansetron  **OR** ondansetron (ZOFRAN) IV, phenol, polyethylene glycol, polyvinyl alcohol, sodium chloride flush  Physical Exam  Constitutional:  Frail and cachectic  HENT:  Temporal wasting  Neck: Normal range of motion.  Cardiovascular: Normal rate.  Pulmonary/Chest:  Becomes short of breath after speaking, dry persistent cough  Musculoskeletal: Normal range of motion.  Neurological: He is alert.  Skin: Skin is warm and dry.  Psychiatric: Thought content normal.  Nursing note and vitals reviewed.  Vital Signs: BP 102/71   Pulse (!) 109   Temp 98.1 F (36.7 C) (Axillary)   Resp 18   Ht '6\' 1"'$  (1.854 m)   Wt 63.6 kg (140 lb 3.4 oz)   SpO2 100%   BMI 18.50 kg/m  SpO2: SpO2: 100 % O2 Device: O2 Device: Bi-PAP O2 Flow Rate: O2 Flow Rate (L/min): 3 L/min  Intake/output summary:   Intake/Output Summary (Last 24 hours) at 11/15/2017 2254 Last data filed at 11/15/2017 2120 Gross per 24 hour  Intake 120 ml  Output 520 ml  Net -400 ml   LBM: Last BM Date: 11/14/17 Baseline Weight: Weight: 68 kg (150 lb) Most recent weight: Weight: 63.6 kg (140 lb 3.4 oz)       Palliative Assessment/Data:    Flowsheet Rows     Most Recent Value  Intake Tab  Referral Department  Hospitalist  Unit at Time of Referral  Med/Surg Unit  Palliative Care Primary Diagnosis  Cancer  Date Notified  10/28/17  Palliative Care Type  New Palliative care  Reason for referral  Clarify Goals of Care  Date of Admission  10/23/2017  Date first seen by Palliative Care  10/29/17  # of days Palliative referral response time  1 Day(s)  # of days IP prior to Palliative referral  2  Clinical Assessment  Palliative Performance Scale Score  40%  Pain Max last 24 hours  6  Pain Min Last 24 hours  0  Psychosocial & Spiritual Assessment  Palliative Care Outcomes  Patient/Family meeting held?  Yes  Who was at the meeting?  patient  Palliative Care Outcomes  Clarified goals of care      Patient Active Problem List    Diagnosis Date Noted  . 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 Atrial flutter ([place])   . Goals of care, counseling/discussion   . Palliative care by specialist   . Cardiac/pericardial tamponade   . Acute combined systolic and diastolic heart failure ([place])   . Protein-calorie malnutrition, severe 10/28/2017  . Acute urinary retention 10/27/2017  . Atrial fibrillation with rapid ventricular response ([place]) 10/27/2017  . Chronic diastolic CHF (congestive heart failure) ([place]) 10/27/2017  . Closed fracture of distal end of left humerus 10/27/2017  . Mass of upper lobe of right lung   . New onset atrial fibrillation ([place])   . Abnormal liver function   . Lung mass   . Essential hypertension   . Altered mental status 05/18/2017  . Right sided weakness 10/14/2016  . Acute urinary tract infection 10/14/2016  . Elevated troponin 10/14/2016  . Type 2 diabetes mellitus with hyperlipidemia ([place]) 10/14/2016  . Fall at home--mechanical 01/26/2014  . Elevated CK 01/26/2014  . Seizure disorder ([place]) 07/26/2013  . NSTEMI (non-ST elevated myocardial infarction) ([place]) 07/26/2013  . Hyperlipidemia 07/26/2013  . Aspiration pneumonia ([place]) 07/26/2013  . 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 fib with RVR.  Wyatt Salas has undergone bronchoscopy and unfortunately was found to have non-small cell lung cancer.  On 11/07/2017, he underwent pericardiocentesis at [facility] that shows malignant cells.  He has met with oncology and is determined to have treatment for his  cancer in [place], where he has family.   Recommendations/Plan:  Mr.  Salas is more dejected today after frank discussion with Dr. Denton Brick.  He continues to minimize his medical problems, but he seems to be starting to accept the severity of his situation.  Continues to decline for me to call any family on his behalf.     Will continue to follow and discuss as he is emotionally willing to engage.   Wyatt Salas would qualify for his hospice benefit.  At this point however he is not open to discussing issues around end-of-life, code status. Wyatt Salas is also affiliated with the [place], who could be another source of support as this goes forward.  Goals of Care and Additional Recommendations:  Limitations on Scope of Treatment: Full Scope Treatment  Code Status:    Code Status Orders  (From admission, onward)        Start     Ordered   10/27/17 0548  Full code  Continuous     10/27/17 0549    Code Status History    Date Active Date Inactive Code Status Order ID Comments User Context   05/18/2017 2330 05/24/2017 2201 Full Code [id number]  Jani Gravel, MD Inpatient   10/14/2016 2118 10/15/2016 1921 Full Code [id number]  Toy Baker, MD Inpatient   01/25/2014 2010 01/26/2014 2035 Full Code [id number]  Corky Sox, MD Inpatient   07/27/2013 0019 07/31/2013 1533 Full Code [id number]  Orvan Falconer, MD Inpatient       Prognosis:   < 6 months in the setting of newly diagnosed non-small cell lung cancer; protein calorie malnutrition with an albumin of 2.4, BMI 16.46  Discharge Planning:  To Be Determined Depending on hospital course, this projected disposition might change.   Care plan was discussed with patient.   Thank you for allowing the Palliative Medicine Team to assist in the care of this patient.   Total Time 30 min Prolonged Time Billed  no       Greater than 50%  of this time was spent counseling and coordinating care related to the above assessment and plan.  Micheline Rough, MD [place] Team [phone]  Please  contact Palliative Medicine Team phone at [phone] for questions and concerns.

## 2017-11-16 LAB — GLUCOSE, CAPILLARY
GLUCOSE-CAPILLARY: 168 mg/dL — AB (ref 65–99)
GLUCOSE-CAPILLARY: 189 mg/dL — AB (ref 65–99)
Glucose-Capillary: 154 mg/dL — ABNORMAL HIGH (ref 65–99)
Glucose-Capillary: 127 mg/dL — ABNORMAL HIGH (ref 65–99)

## 2017-11-16 MED ORDER — HYDROMORPHONE HCL 1 MG/ML IJ SOLN: 0.5000 mg | INTRAMUSCULAR | Status: DC | PRN

## 2017-11-16 MED ORDER — LORAZEPAM 2 MG/ML IJ SOLN: 1.0000 mg | INTRAMUSCULAR | Status: DC | PRN

## 2017-11-16 MED ORDER — LORAZEPAM 2 MG/ML PO CONC
1.0000 mg | ORAL | Status: DC | PRN
Start: 2017-11-16 — End: 2017-11-17

## 2017-11-16 MED ORDER — POLYVINYL ALCOHOL 1.4 % OP SOLN
1.0000 [drp] | Freq: Four times a day (QID) | OPHTHALMIC | Status: DC | PRN
  Filled 2017-11-16: qty 15

## 2017-11-16 MED ORDER — GLYCOPYRROLATE 1 MG PO TABS
1.0000 mg | ORAL_TABLET | ORAL | Status: DC | PRN
  Filled 2017-11-16: qty 1

## 2017-11-16 MED ORDER — ALUM & MAG HYDROXIDE-SIMETH 200-200-20 MG/5ML PO SUSP: 30.0000 mL | Freq: Four times a day (QID) | ORAL | Status: DC | PRN

## 2017-11-16 MED ORDER — MORPHINE SULFATE (CONCENTRATE) 10 MG/0.5ML PO SOLN
5.0000 mg | ORAL | Status: DC | PRN
  Administered 2017-11-16 – 2017-11-17 (×2): 5 mg via SUBLINGUAL
  Filled 2017-11-16 (×2): qty 0.5

## 2017-11-16 MED ORDER — GLYCOPYRROLATE 0.2 MG/ML IJ SOLN
0.2000 mg | INTRAMUSCULAR | Status: DC | PRN
  Filled 2017-11-16: qty 1

## 2017-11-16 MED ORDER — MIRTAZAPINE 15 MG PO TBDP
15.0000 mg | ORAL_TABLET | Freq: Every day | ORAL | Status: DC
  Filled 2017-11-16 (×2): qty 1

## 2017-11-16 MED ORDER — ONDANSETRON 4 MG PO TBDP
4.0000 mg | ORAL_TABLET | Freq: Four times a day (QID) | ORAL | Status: DC | PRN
  Filled 2017-11-16: qty 1

## 2017-11-16 MED ORDER — GLYCOPYRROLATE 0.2 MG/ML IJ SOLN
0.2000 mg | INTRAMUSCULAR | Status: DC | PRN
  Administered 2017-11-16 – 2017-11-18 (×2): 0.2 mg via INTRAVENOUS
  Filled 2017-11-16 (×3): qty 1

## 2017-11-16 MED ORDER — BISACODYL 10 MG RE SUPP: 10.0000 mg | Freq: Every day | RECTAL | Status: DC | PRN

## 2017-11-16 MED ORDER — ACETAMINOPHEN 650 MG RE SUPP
650.0000 mg | Freq: Four times a day (QID) | RECTAL | Status: DC | PRN
Start: 2017-11-16 — End: 2017-11-17

## 2017-11-16 MED ORDER — LORAZEPAM 1 MG PO TABS
1.0000 mg | ORAL_TABLET | ORAL | Status: DC | PRN
  Administered 2017-11-16: 1 mg via ORAL
  Filled 2017-11-16: qty 1

## 2017-11-16 MED ORDER — MORPHINE SULFATE (CONCENTRATE) 10 MG/0.5ML PO SOLN
5.0000 mg | ORAL | Status: DC | PRN
  Administered 2017-11-16: 5 mg via ORAL
  Filled 2017-11-16: qty 0.5

## 2017-11-16 MED ORDER — OLANZAPINE 5 MG PO TBDP
5.0000 mg | ORAL_TABLET | Freq: Every day | ORAL | Status: DC
  Filled 2017-11-16 (×2): qty 1

## 2017-11-16 MED ORDER — BIOTENE DRY MOUTH MT LIQD: 15.0000 mL | OROMUCOSAL | Status: DC | PRN

## 2017-11-16 MED ORDER — ALBUTEROL SULFATE (2.5 MG/3ML) 0.083% IN NEBU: 2.5000 mg | INHALATION_SOLUTION | RESPIRATORY_TRACT | Status: DC | PRN

## 2017-11-16 MED ORDER — ONDANSETRON HCL 4 MG/2ML IJ SOLN: 4.0000 mg | Freq: Four times a day (QID) | INTRAMUSCULAR | Status: DC | PRN

## 2017-11-16 MED ORDER — ACETAMINOPHEN 325 MG PO TABS: 650.0000 mg | ORAL_TABLET | Freq: Four times a day (QID) | ORAL | Status: DC | PRN

## 2017-11-16 NOTE — Progress Notes (Signed)
Patient Demographics:    Wyatt Salas, is a 67 y.o. male, DOB - 1951-03-29, ASN:[id number]  Admit date - 10/18/2017   Admitting Physician Vianne Bulls, MD  Outpatient Primary MD for the patient is [name], MD  LOS - 68   Chief Complaint  Patient presents with  . Fall  . Shoulder Pain  . Headache        Subjective:    Wyatt Salas today has no fevers, no emesis, more short of breath, tachypneic,  Ms [facility]) at bedside,  I had a care conference at bedside with patient, POA, RN  Orvil Feil, respiratory therapist, patient and POA request DNR/DNI status with complete comfort care, anticipate Death in the hospital, if patient lives longer may be transferred to hospice house .  Okay to take off BiPAP as needed to allow him to have oral intake.  No further blood draws, complete comfort cares at this time.  Palliative care team and spiritual care notified of patient and POA decision to transition to complete comfort care as of 11/16/2017   Assessment  & Plan :    Principal Problem:   Atrial fibrillation with rapid ventricular response (HCC) Active Problems:   Seizure disorder (HCC)   Type 2 diabetes mellitus with hyperlipidemia (HCC)   Lung mass   Essential hypertension   Acute urinary retention   Chronic diastolic CHF (congestive heart failure) (HCC)   Closed fracture of distal end of left humerus   Protein-calorie malnutrition, severe   Acute combined systolic and diastolic heart failure (HCC)   Cardiac/pericardial tamponade   Goals of care, counseling/discussion   Palliative care by specialist   Atrial flutter ([place])   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  Upon admission on the chest x-ray there was concern of right upper lobe posterior mass like lesion.  His atrial fibrillation was treated with Cardizem and Lopressor.  He also developed pleural effusion during his hospital course and underwent thoracentesis on October 29, 2017.  Pleural fluid suggested of few malignant cells and then later suggested he had poorly differentiated non-small cell carcinoma.  He is also developed pericardial effusion which is noted to be malignant and about 750 cc of this was drained.  His atrial fibrillation has remained difficult to control therefore currently is on amiodarone and Lopressor.  Cardiology is following but difficult to titrate this given his soft blood pressure.  Patient is also been seen by palliative care, patient plans to move to [place] once he can establish care with a PCP at [place] [name]  Overall prognosis is grave, patient unlikely to be able to go to [place] given his declining health.  Ms [facility]) at bedside,  I had a care conference at bedside with patient, POA, RN  Orvil Feil, respiratory therapist, patient and POA request DNR/DNI status with complete comfort care, anticipate Death in the hospital, if patient lives longer may be transferred to hospice house .  Okay to take off BiPAP  as needed to allow him to have oral intake.  No further blood draws, complete comfort cares at this time.  Palliative care team and spiritual care notified of patient and POA decision to transition to complete comfort care as of 11/16/2017   Plan:- 1)Atrial flutter with rapid ventricular response rate; CHADScVASC 4- (age >3, HTN, DM, CHF) Patient with history of recurrent falls and prior subdural hematoma, also has history of malignant pericardial effusion, not a good candidate for anticoagulation,  Ms [facility]) at bedside,  I  had a care conference at bedside with patient, POA, RN  Orvil Feil, respiratory therapist, patient and POA request DNR/DNI status with complete comfort care, anticipate Death in the hospital, if patient lives longer may be transferred to hospice house .  Okay to take off BiPAP as needed to allow him to have oral intake.  No further blood draws, complete comfort cares at this time.  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  Palliative care team is following.  Repeat echo on 11/13/2017 with trivial pericardial effusion, EF 40 to 45% with akinesis of the inferior lateral wall  3)H/o Essential hypertension- -comfort care only, please see #1 above  4)Chronic combined systolic and diastolic congestive heart failure, class II/Coronary artery disease --Ejection fraction 40-45% with grade 2 diastolic dysfunction and diffuse hypokinesis, -  Repeat echo on 11/13/2017 with trivial pericardial effusion, EF 40 to 45% with akinesis of the inferior lateral wall. Complete comfort care at this time, please see #1 above  5)Left humerus fracture- -Secondary to fall,   6)Diabetes mellitus type 2- -Hemoglobin A1c- 6.5,    7)History of seizure disorder- -   8)Urinary retention - Likely has underlying BPH- -may use Foley catheter comfort care protocol  9)History of alcohol abuse- -No longer at risk of withdrawal as he has been here for several days.    10)Hyponatremia- -Consistent with  SIADH  11)Generalized weakness and deconditioning/Moderate  protein calorie malnutrition-  Ms [facility]) at bedside,  I had a care conference at bedside with patient, POA, RN  Orvil Feil, respiratory therapist, patient and POA request DNR/DNI status with complete comfort care, anticipate Death in the hospital, if patient lives longer may be transferred to hospice house .  Okay to take off BiPAP as needed to allow him to have oral intake.  No further blood draws, complete comfort cares at this time.  Palliative care team and spiritual care notified of patient and POA decision to transition to complete comfort care as of 11/16/2017  12)Hypoxia/Large Rt sided Pleural Effusion- had right-sided palliative thoracentesis on 11/15/2017  13)Social/Ethics-overall prognosis is grave, DNR/DNI  Disposition-   We do not have many "good" options for treating his rapid atrial flutter which is likely being driven by his underlying malignancy.    Ms [facility]) at bedside,  I had a care conference at bedside with patient, POA, RN  Orvil Feil, respiratory therapist, patient and POA request DNR/DNI status with complete comfort care, anticipate Death in the hospital, if patient lives longer may be transferred to hospice  house .  Faythe Ghee to take off BiPAP as needed to allow him to have oral intake.  No further blood draws, complete comfort cares at this time.  Palliative care team and spiritual care notified of patient and POA decision to transition to complete comfort care as of 11/16/2017  Code Status:  DNR/DNI  Family Communication:  called and d/w Ms [facility]) Disposition Plan:    Residential hospice House , see #1 above/anticipate Death in the hospital, if patient lives longer may be transferred to hospice house Consultants:   Cardiology/palliative  Procedures:   Thoracentesis 4/15  Bronchoscopy 4/16 Rt sided Thoracentensis 11/15/17    Lab Results  Component Value Date   PLT 531 (H)  11/15/2017    Inpatient Medications  Scheduled Meds: . amiodarone  400 mg Oral BID  . aspirin  81 mg Oral Daily  . atorvastatin  40 mg Oral q1800  . benzonatate  200 mg Oral BID  . digoxin  0.125 mg Oral Daily  . docusate sodium  200 mg Oral BID  . feeding supplement (ENSURE ENLIVE)  237 mL Oral BID BM  . folic acid  1 mg Oral Daily  . guaiFENesin-codeine  10 mL Oral Once  . heparin  5,000 Units Subcutaneous Q8H  . insulin aspart  0-5 Units Subcutaneous QHS  . insulin aspart  0-9 Units Subcutaneous TID WC  . lacosamide  100 mg Oral BID  . levETIRAcetam  1,500 mg Oral BID  . metoprolol tartrate  25 mg Oral BID  . mirtazapine  15 mg Oral QHS  . multivitamin with minerals  1 tablet Oral Daily  . OLANZapine zydis  5 mg Oral Daily  . phenytoin  30 mg Oral BID  . sodium chloride flush  3 mL Intravenous Q12H  . tamsulosin  0.4 mg Oral Daily  . thiamine  100 mg Oral Daily   Continuous Infusions: . sodium chloride Stopped (11/11/17 2300)   PRN Meds:.sodium chloride, acetaminophen **OR** acetaminophen, acetaminophen, albuterol, alum & mag hydroxide-simeth, antiseptic oral rinse, bisacodyl, diltiazem, glycopyrrolate **OR** glycopyrrolate **OR** glycopyrrolate, guaiFENesin-dextromethorphan, HYDROcodone-acetaminophen, HYDROmorphone (DILAUDID) injection, LORazepam **OR** LORazepam **OR** LORazepam, LORazepam, morphine CONCENTRATE **OR** morphine CONCENTRATE, ondansetron **OR** ondansetron (ZOFRAN) IV, ondansetron **OR** ondansetron (ZOFRAN) IV, phenol, polyethylene glycol, polyvinyl alcohol, sodium chloride flush    Anti-infectives (From admission, onward)   None       Objective:   Vitals:   11/16/17 1120 11/16/17 1223 11/16/17 1612 11/16/17 1700  BP: 111/76 (!) 143/98 114/90 100/73  Pulse: (!) 135  (!) 124   Resp: (!) 34 (!) 30 (!) 25 16  Temp:      TempSrc:      SpO2: 94% 92% 95% 96%  Weight:      Height:        Wt Readings from Last 3 Encounters:  11/16/17 64.4 kg (141 lb  15.6 oz)  09/20/17 68 kg (150 lb)  09/13/17 65.8 kg (145 lb)     Intake/Output Summary (Last 24 hours) at 11/16/2017 1813 Last data filed at 11/16/2017 1729 Gross per 24 hour  Intake 120 ml  Output 540 ml  Net -420 ml   Physical Exam Gen:- Awake Alert, mild respiratory distress HEENT:- [place].AT, No sclera icterus Neck-Supple Neck,No JVD,.  Lungs-   diminished on the right, tachypnea noted CV- S1, S2 normal, tachycardic and irregular Abd-  +ve B.Sounds, Abd Soft, No tenderness,    Extremity/Skin:-Negative Homans Psych-affect is appropriate, oriented x3 Neuro-no new focal deficits, no tremors  Data Review:   Micro Results No results found for this or any previous visit (from the past 240 hour(s)).  Radiology Reports Ct Abdomen Pelvis Wo Contrast  Result Date: 10/23/2017 CLINICAL DATA:  Left shoulder and forehead pain after fall this morning. Lung mass. EXAM: CT CHEST, ABDOMEN, AND PELVIS WITHOUT CONTRAST TECHNIQUE: Multidetector CT imaging of the chest, abdomen and pelvis was performed without IV contrast. CONTRAST:  The patient's IV infiltrated during imaging and the order was changed to CT of the chest, abdomen and pelvis without per ordering clinician. COMPARISON:  PET-CT 05/31/2017 FINDINGS: CT CHEST FINDINGS Cardiovascular: Top-normal size heart with left main and three-vessel coronary arteriosclerosis. Aortic atherosclerosis without aneurysm. The unenhanced pulmonary vasculature is difficult to assess beyond the main pulmonary artery due to soft tissue masses and adenopathy about the right hilum and mediastinum. Mediastinum/Nodes: Interval increase in masslike abnormalities in the mediastinum and right hilum compatible with progression of metastatic adenopathy and/or increase and extension of known right upper lobe mass. Largest lymph node identified is subcarinal at 2.8 cm. Interval increase in right upper paratracheal lymph node measuring 1.1 cm. Exact margins of additional lymph  node masses are difficult to delineate on this unenhanced study. There is luminal narrowing of the distal right mainstem bronchus without occlusion secondary to the right hilar, mediastinal and subcarinal masslike abnormalities. The thyroid gland is unremarkable. The esophagus is not well visualized. Lungs/Pleura: Partially loculated small to moderate right effusion. New pulmonary nodular masslike opacities are noted within the right upper, middle and lower lobes with interval progression in size of spiculated right upper lobe mass now measuring approximately 5.8 x 5.3 x 5 cm. Within the superior segment of the aerated right lower lobe are metastatic nodules measuring up to 2.2 cm. Additional nodules are seen in the right middle and anterior right lower lobe. Left lung remains clear. Nodular thickening along the left major fissure is seen which may represent fluid in the fissure versus an intra fissural mass. This measures 2.2 x 1.2 x 1.7 cm. Peribronchial thickening is noted within both lower lobes. Musculoskeletal: Remote right posterior seventh through ninth rib fractures. No suspicious osseous lesions. Extravasated contrast noted in the soft tissues of the right arm. CT ABDOMEN PELVIS FINDINGS Hepatobiliary: No biliary dilatation. The unenhanced liver is unremarkable. The gallbladder is physiologically distended. Pancreas: No ductal dilatation or inflammation. Spleen: No splenomegaly or mass. Adrenals/Urinary Tract: No adrenal mass. Interpolar 2.5 cm simple right renal cyst. Unremarkable noncontrast appearance of the left kidney. No obstructive uropathy. Marked bladder distention to the umbilicus with the bladder measuring 14.7 x 14.1 x 18.8 cm (volume = 2040 cm^3). Stomach/Bowel: Stomach is within normal limits. Appendix appears normal. No evidence of bowel wall thickening, distention, or inflammatory changes. Vascular/Lymphatic: Moderate aortoiliac and branch vessel atherosclerosis. No aneurysm. No adenopathy.  Reproductive: Normal size prostate. Other: No free air nor free fluid. Musculoskeletal: Degenerative disc disease L5-S1. No aggressive osseous lesions. IMPRESSION: Extravasation of IV contrast into the patient's right arm. Study was flagged for contrast extravasation radiology PA follow-up should the patient be discharged. Chest CT: 1. Interval increase in spiculated right upper lobe mass now measuring approximately 5.8 x 5.3 x 5 cm with progression of metastatic mediastinal and hilar adenopathy as well as development of satellite pulmonary nodules in the right lung. Slight luminal narrowing of the distal right mainstem bronchus due to adenopathy. No occlusion noted however. 2. Ovoid density in the left major fissure may represent a pseudo lesion with fluid in the fissure versus  an intra fissural mass measuring 2.2 x 1.2 x 1.7 cm. 3. Loculated moderate right pleural effusion. Abdomen and pelvic CT: 1. Marked urinary distention with volume of approximately 2 L. 2. Simple right interpolar renal cyst measuring 2.5 cm. 3. No apparent evidence of metastatic disease within the abdomen and pelvis given limitations of a noncontrast exam. Electronically Signed   By: Ashley Royalty M.D.   On: 11/09/2017 22:55   Dg Chest 1 View  Result Date: 11/15/2017 CLINICAL DATA:  Right thoracentesis EXAM: CHEST  1 VIEW COMPARISON:  Yesterday FINDINGS: Diminished right pleural effusion with some aerated lung now seen at the right base. Layering left pleural effusion and basal parenchymal opacity. Stable mediastinal contours. No visible pneumothorax. IMPRESSION: No acute finding after right thoracentesis. Aerated lung is newly seen at the right base. Electronically Signed   By: Monte Fantasia M.D.   On: 11/15/2017 18:27   Dg Chest 2 View  Result Date: 10/25/2017 CLINICAL DATA:  Cough, history of lung cancer. EXAM: CHEST - 2 VIEW COMPARISON:  05/18/2017 chest CT, CXR 05/18/2017 FINDINGS: Interval increase in size of masslike opacity in  the right upper lobe, best appreciated on the sagittal view measuring approximately 4 x 3.5 x 4.8 cm with right paratracheal and perihilar soft tissue prominence concerning for progression of lymphadenopathy. Superimposed adjacent pneumonia is difficult to entirely exclude. Hyperinflated left lung without pulmonary consolidation or dominant mass. Heart size is top-normal. No aortic aneurysm. No suspicious osseous lesions. IMPRESSION: 1. Interval increase in size of right upper lobe posterior masslike abnormality now estimated at 4 x 3.5 x 4.8 cm, previously estimated at approximately 3.6 cm. 2. Interval increase in soft tissue prominence about the mediastinum and right hilum compatible with metastatic lymphadenopathy. Superimposed adjacent airspace opacities cannot exclude pneumonia and/or areas of atelectasis. Electronically Signed   By: Ashley Royalty M.D.   On: 11/07/2017 19:37   Dg Elbow Complete Left (3+view)  Result Date: 10/29/2017 CLINICAL DATA:  Elbow fracture. EXAM: LEFT ELBOW - COMPLETE 3+ VIEW; LEFT FOREARM - 2 VIEW COMPARISON:  Left elbow and forearm x-rays dated September 20, 2017. FINDINGS: Left elbow: Unchanged mild cortical step-off along the medial aspect of the capitellum. No new fracture. Persistent elbow joint effusion. Osteopenia. Soft tissues are unremarkable. Left forearm: No radius or ulna fracture. No focal bone lesion. Osteopenia. Soft tissues are unremarkable. IMPRESSION: 1. Unchanged minimally displaced fracture of the capitellum. Persistent elbow joint effusion. 2. No acute abnormality of the forearm. Electronically Signed   By: Titus Dubin M.D.   On: 10/29/2017 13:17   Dg Forearm Left  Result Date: 10/29/2017 CLINICAL DATA:  Elbow fracture. EXAM: LEFT ELBOW - COMPLETE 3+ VIEW; LEFT FOREARM - 2 VIEW COMPARISON:  Left elbow and forearm x-rays dated September 20, 2017. FINDINGS: Left elbow: Unchanged mild cortical step-off along the medial aspect of the capitellum. No new fracture.  Persistent elbow joint effusion. Osteopenia. Soft tissues are unremarkable. Left forearm: No radius or ulna fracture. No focal bone lesion. Osteopenia. Soft tissues are unremarkable. IMPRESSION: 1. Unchanged minimally displaced fracture of the capitellum. Persistent elbow joint effusion. 2. No acute abnormality of the forearm. Electronically Signed   By: Titus Dubin M.D.   On: 10/29/2017 13:17   Ct Head Wo Contrast  Result Date: 10/21/2017 CLINICAL DATA:  [place] out of wheelchair today. Left forehead and left shoulder pain. Initial encounter. EXAM: CT HEAD WITHOUT CONTRAST CT CERVICAL SPINE WITHOUT CONTRAST TECHNIQUE: Multidetector CT imaging of the head and cervical spine was performed  following the standard protocol without intravenous contrast. Multiplanar CT image reconstructions of the cervical spine were also generated. COMPARISON:  09/21/2017 FINDINGS: CT HEAD FINDINGS Brain: Right MCA territory encephalomalacia is unchanged as is more extensive right temporal lobe encephalomalacia which may be in part postsurgical. There is marked cerebral scratched of there is marked cerebellar atrophy. Periventricular white matter hypodensities are unchanged and nonspecific but compatible with mild chronic small vessel ischemic disease. No acute large territory infarct, intracranial hemorrhage, mass, midline shift, or extra-axial fluid collection is identified. Vascular: Calcified atherosclerosis at the skull base. No hyperdense vessel. Skull: Prior right pterional craniotomy.  No acute fracture. Sinuses/Orbits: The visualized paranasal sinuses and mastoid air cells are clear. The orbits are unremarkable. Other: None. CT CERVICAL SPINE FINDINGS Alignment: Chronic reversal of the normal cervical lordosis. Unchanged grade 1 anterolisthesis of C2 on C3 and C3 on C4. Skull base and vertebrae: No acute fracture or suspicious osseous lesion. Soft tissues and spinal canal: No prevertebral fluid or swelling. No visible canal  hematoma. Disc levels: Moderate diffuse cervical disc degeneration and moderate to severe left greater than right mid upper cervical facet arthrosis resulting in advanced multilevel neural foraminal stenosis. Upper chest: Right pleural effusion. Unchanged 3 mm left apical lung nodule. Other: Moderate calcified atherosclerosis at the carotid bifurcations. IMPRESSION: 1. No evidence of acute intracranial abnormality. 2. Right cerebral hemisphere encephalomalacia. Severe cerebellar atrophy. 3. No acute cervical spine fracture. Electronically Signed   By: Logan Bores M.D.   On: 11/03/2017 19:51   Ct Chest Wo Contrast  Result Date: 11/03/2017 CLINICAL DATA:  Left shoulder and forehead pain after fall this morning. Lung mass. EXAM: CT CHEST, ABDOMEN, AND PELVIS WITHOUT CONTRAST TECHNIQUE: Multidetector CT imaging of the chest, abdomen and pelvis was performed without IV contrast. CONTRAST:  The patient's IV infiltrated during imaging and the order was changed to CT of the chest, abdomen and pelvis without per ordering clinician. COMPARISON:  PET-CT 05/31/2017 FINDINGS: CT CHEST FINDINGS Cardiovascular: Top-normal size heart with left main and three-vessel coronary arteriosclerosis. Aortic atherosclerosis without aneurysm. The unenhanced pulmonary vasculature is difficult to assess beyond the main pulmonary artery due to soft tissue masses and adenopathy about the right hilum and mediastinum. Mediastinum/Nodes: Interval increase in masslike abnormalities in the mediastinum and right hilum compatible with progression of metastatic adenopathy and/or increase and extension of known right upper lobe mass. Largest lymph node identified is subcarinal at 2.8 cm. Interval increase in right upper paratracheal lymph node measuring 1.1 cm. Exact margins of additional lymph node masses are difficult to delineate on this unenhanced study. There is luminal narrowing of the distal right mainstem bronchus without occlusion secondary  to the right hilar, mediastinal and subcarinal masslike abnormalities. The thyroid gland is unremarkable. The esophagus is not well visualized. Lungs/Pleura: Partially loculated small to moderate right effusion. New pulmonary nodular masslike opacities are noted within the right upper, middle and lower lobes with interval progression in size of spiculated right upper lobe mass now measuring approximately 5.8 x 5.3 x 5 cm. Within the superior segment of the aerated right lower lobe are metastatic nodules measuring up to 2.2 cm. Additional nodules are seen in the right middle and anterior right lower lobe. Left lung remains clear. Nodular thickening along the left major fissure is seen which may represent fluid in the fissure versus an intra fissural mass. This measures 2.2 x 1.2 x 1.7 cm. Peribronchial thickening is noted within both lower lobes. Musculoskeletal: Remote right posterior seventh through ninth rib  fractures. No suspicious osseous lesions. Extravasated contrast noted in the soft tissues of the right arm. CT ABDOMEN PELVIS FINDINGS Hepatobiliary: No biliary dilatation. The unenhanced liver is unremarkable. The gallbladder is physiologically distended. Pancreas: No ductal dilatation or inflammation. Spleen: No splenomegaly or mass. Adrenals/Urinary Tract: No adrenal mass. Interpolar 2.5 cm simple right renal cyst. Unremarkable noncontrast appearance of the left kidney. No obstructive uropathy. Marked bladder distention to the umbilicus with the bladder measuring 14.7 x 14.1 x 18.8 cm (volume = 2040 cm^3). Stomach/Bowel: Stomach is within normal limits. Appendix appears normal. No evidence of bowel wall thickening, distention, or inflammatory changes. Vascular/Lymphatic: Moderate aortoiliac and branch vessel atherosclerosis. No aneurysm. No adenopathy. Reproductive: Normal size prostate. Other: No free air nor free fluid. Musculoskeletal: Degenerative disc disease L5-S1. No aggressive osseous lesions.  IMPRESSION: Extravasation of IV contrast into the patient's right arm. Study was flagged for contrast extravasation radiology PA follow-up should the patient be discharged. Chest CT: 1. Interval increase in spiculated right upper lobe mass now measuring approximately 5.8 x 5.3 x 5 cm with progression of metastatic mediastinal and hilar adenopathy as well as development of satellite pulmonary nodules in the right lung. Slight luminal narrowing of the distal right mainstem bronchus due to adenopathy. No occlusion noted however. 2. Ovoid density in the left major fissure may represent a pseudo lesion with fluid in the fissure versus an intra fissural mass measuring 2.2 x 1.2 x 1.7 cm. 3. Loculated moderate right pleural effusion. Abdomen and pelvic CT: 1. Marked urinary distention with volume of approximately 2 L. 2. Simple right interpolar renal cyst measuring 2.5 cm. 3. No apparent evidence of metastatic disease within the abdomen and pelvis given limitations of a noncontrast exam. Electronically Signed   By: Ashley Royalty M.D.   On: 10/28/2017 22:55   Ct Cervical Spine Wo Contrast  Result Date: 10/27/2017 CLINICAL DATA:  [place] out of wheelchair today. Left forehead and left shoulder pain. Initial encounter. EXAM: CT HEAD WITHOUT CONTRAST CT CERVICAL SPINE WITHOUT CONTRAST TECHNIQUE: Multidetector CT imaging of the head and cervical spine was performed following the standard protocol without intravenous contrast. Multiplanar CT image reconstructions of the cervical spine were also generated. COMPARISON:  09/21/2017 FINDINGS: CT HEAD FINDINGS Brain: Right MCA territory encephalomalacia is unchanged as is more extensive right temporal lobe encephalomalacia which may be in part postsurgical. There is marked cerebral scratched of there is marked cerebellar atrophy. Periventricular white matter hypodensities are unchanged and nonspecific but compatible with mild chronic small vessel ischemic disease. No acute large  territory infarct, intracranial hemorrhage, mass, midline shift, or extra-axial fluid collection is identified. Vascular: Calcified atherosclerosis at the skull base. No hyperdense vessel. Skull: Prior right pterional craniotomy.  No acute fracture. Sinuses/Orbits: The visualized paranasal sinuses and mastoid air cells are clear. The orbits are unremarkable. Other: None. CT CERVICAL SPINE FINDINGS Alignment: Chronic reversal of the normal cervical lordosis. Unchanged grade 1 anterolisthesis of C2 on C3 and C3 on C4. Skull base and vertebrae: No acute fracture or suspicious osseous lesion. Soft tissues and spinal canal: No prevertebral fluid or swelling. No visible canal hematoma. Disc levels: Moderate diffuse cervical disc degeneration and moderate to severe left greater than right mid upper cervical facet arthrosis resulting in advanced multilevel neural foraminal stenosis. Upper chest: Right pleural effusion. Unchanged 3 mm left apical lung nodule. Other: Moderate calcified atherosclerosis at the carotid bifurcations. IMPRESSION: 1. No evidence of acute intracranial abnormality. 2. Right cerebral hemisphere encephalomalacia. Severe cerebellar atrophy. 3. No acute  cervical spine fracture. Electronically Signed   By: Logan Bores M.D.   On: 11/03/2017 19:51   Dg Chest Right Decubitus  Result Date: 11/08/2017 CLINICAL DATA:  Pleural effusion. EXAM: CHEST - RIGHT DECUBITUS COMPARISON:  11/07/2017 FINDINGS: Decubital radiograph, right side down demonstrates layering left pleural effusion. There is a known right pleural effusion. Poor aeration of the right lung likely due to compression, large pulmonary masses and loculated right pleural effusion. IMPRESSION: Layering left pleural effusion. Loculated right pleural effusion.  Poor aeration of the right lung. Electronically Signed   By: Fidela Salisbury M.D.   On: 11/08/2017 21:36   Dg Chest Port 1 View  Result Date: 11/14/2017 CLINICAL DATA:  Dyspnea. EXAM:  PORTABLE CHEST 1 VIEW COMPARISON:  11/08/2017 and older exams. FINDINGS: There is complete opacification of the right hemithorax. Hazy airspace consolidation is noted in the left lower lung. The left hemidiaphragm is obscured. This appears mildly increased from the most recent prior exams. No pneumothorax. IMPRESSION: 1. Complete opacification of the right hemithorax. This is consistent with a large pleural effusion in combination with the known right lung mass as well as atelectasis. 2. Opacity in the left lower lung consistent with a combination of a left pleural effusion with atelectasis or possibly pneumonia. This has mildly increased from the prior exam. Electronically Signed   By: Lajean Manes M.D.   On: 11/14/2017 19:09   Dg Chest Port 1 View  Result Date: 11/07/2017 CLINICAL DATA:  Pleural effusion. EXAM: PORTABLE CHEST 1 VIEW COMPARISON:  Multiple prior studies including CT 11/01/2017 FINDINGS: The heart margins are obscured by significant opacity in the RIGHT hemithorax. A RIGHT-sided small bore pleural drain is in place, overlying the heart and RIGHT lung base. There is increased opacity in the RIGHT hemithorax, consistent with pleural effusion and parenchymal opacity. There is volume loss in the RIGHT lung. LEFT basilar opacity partially obscures the hemidiaphragm. Multiple LEFT-sided skin folds. No pneumothorax. There are distended loops of bowel in the LEFT UPPER QUADRANT the abdomen. Remote bilateral rib fractures. IMPRESSION: 1. Increased opacity in the RIGHT lung. 2. Stable LEFT basilar opacity. Electronically Signed   By: Nolon Nations M.D.   On: 11/07/2017 10:49   Dg Chest Port 1 View  Result Date: 10/22/2017 CLINICAL DATA:  67 year old male with a history of cough and shortness of breath EXAM: PORTABLE CHEST 1 VIEW COMPARISON:  Multiple prior chest x-rays most recent 11/01/2017, 10/29/2017, 11/07/2017, prior chest CT 11/08/2017 FINDINGS: Cardiomediastinal silhouette unchanged in size  and contour, with heart borders partially obscured by lung/pleural disease. Significant right rotation of the patient somewhat limits evaluation, however, there is increasing opacity at the right lung with mixed interstitial, interlobular septal opacities, and airspace opacity with evidence of pleural effusion. Increasing opacity along the upper right mediastinal border/paratracheal border. No pneumothorax. Left lung remains relatively well aerated. IMPRESSION: Increasing opacity of the right lung, likely a combination of airspace opacity/consolidation, lung volume loss, pleural effusion, and tumor in this patient with prior cross-sectional imaging demonstrating lung mass. Given the increasing airspace opacity, differential includes endobronchial tumor/debris leading to partial volume loss. Correlation with bronchoscopy may be useful. Electronically Signed   By: Corrie Mckusick D.O.   On: 10/23/2017 13:37   Dg Chest Port 1 View  Result Date: 11/01/2017 CLINICAL DATA:  Shortness of breath EXAM: PORTABLE CHEST 1 VIEW COMPARISON:  10/29/2017 FINDINGS: Cardiac shadow is stable. The left lung remains clear. Old rib fractures on the left are noted. Large skin fold is  noted over the left mid lung. Persistent increased density is noted in the right upper lobe consistent with the known underlying mass lesion. Patchy infiltrative changes are noted in the right middle lobe as well. Old rib fractures on the right are seen. IMPRESSION: Stable right upper lobe mass lesion with associated patchy infiltrates similar to that noted on prior CT examination. No large pleural effusion is seen. Electronically Signed   By: Inez Catalina M.D.   On: 11/01/2017 09:53   Dg Chest Port 1 View  Result Date: 10/29/2017 CLINICAL DATA:  Status post thoracentesis. EXAM: PORTABLE CHEST 1 VIEW COMPARISON:  Chest radiograph 11/06/2017 FINDINGS: Negative for pneumothorax following thoracentesis. There may be a small amount of right pleural fluid  present. Extensive airspace densities throughout the right lung particularly in the perihilar regions. The degree of right lung consolidation may have slightly increased. Left lung remains clear. Heart size is grossly stable. Old right rib fractures. IMPRESSION: Negative for pneumothorax following thoracentesis. Extensive airspace disease and consolidation in the right lung and cannot exclude progression since prior examination. Electronically Signed   By: Markus Daft M.D.   On: 10/29/2017 10:23   Dg Shoulder Left  Result Date: 11/04/2017 CLINICAL DATA:  [place] striking the left side today. Recent distal humeral fracture. EXAM: LEFT SHOULDER - 2+ VIEW COMPARISON:  Left humerus series of Nov 17, 2009 FINDINGS: There is chronic deformity of the distal clavicle. No acute clavicular fracture is observed. The scapula and proximal humerus appear normal. There is narrowing of the glenohumeral joint space. IMPRESSION: There is no acute fracture or dislocation of the left shoulder. 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 There is chronic deformity of the distal clavicle. Electronically Signed   By: David  Martinique M.D.   On: 10/25/2017 11:20   Dg Foot Complete Left  Result Date: 11/01/2017 CLINICAL DATA:  Left heel pain. EXAM: LEFT FOOT - COMPLETE 3+ VIEW COMPARISON:  09/21/2017. FINDINGS: Osteopenia. Mild degenerative changes at the first metatarsophalangeal joint with hallux valgus. Single screw traverses the posterior calcaneus with adjacent bony overgrowth. No acute osseous abnormality. IMPRESSION: 1. Postoperative changes in the calcaneus without acute osseous abnormality. 2. Mild first metatarsophalangeal joint osteoarthritis. Electronically Signed   By: Lorin Picket M.D.   On: 11/01/2017 09:57   Dg Swallowing Func-speech Pathology  Result Date: 11/03/2017 Objective Swallowing Evaluation: Type of Study: MBS-Modified Barium Swallow Study  Patient Details Name: MAURILIO PURYEAR MRN:  [id number] Date of Birth: Mar 05, 1951 Today's Date: 11/03/2017 Time: SLP Start Time (ACUTE ONLY): 0910 -SLP Stop Time (ACUTE ONLY): 0930 SLP Time Calculation (min) (ACUTE ONLY): 20 min Past Medical History: Past Medical History: Diagnosis Date . Diabetes mellitus without complication ([place])   Type II . Diastolic dysfunction   a. ECHO 07/2013 EF 55-60% and grade 1 diastolic dysfunction . NSTEMI (non-ST elevated myocardial infarction) ([place])   problem list 01/2014 . Rhabdomyolysis 01/2014 . Seizures ([place])  . Syncope 01/2014 . Traumatic brain injury [facility])  Past Surgical History: Past Surgical History: Procedure Laterality Date . CRANIOTOMY   . VIDEO BRONCHOSCOPY Bilateral 10/25/2017  Procedure: VIDEO BRONCHOSCOPY WITH FLUORO;  Surgeon: Collene Gobble, MD;  Location: [facility];  Service: Cardiopulmonary;  Laterality: Bilateral; HPI: 67 yo male adm to [facility] with AMS - Pt found to have right lung mass, TBI, SDH, ETOH use, seizures, essentially wheelchair bound - s/p falls with left humerus fx and ankle injury.  Pt is s/p thoracentesis 10/29/17 and bronchoscopy 10/28/2017 with findings abnormal cells.  Swallow eval ordered.   Imaging studies showed right cerebral encephalomalacia and severe cerebellar atrophy.   Pt underwent BSE and was found to be high aspiration risk, MBS ordered.   Subjective: pt awake in chair Assessment / Plan / Recommendation CHL IP CLINICAL IMPRESSIONS 11/03/2017 Clinical Impression Pt presents with mild oral dysphagia and intact pharyngeal swallow ability.  NO aspiration or penetration noted during MBS.  Pt demonstrates extended breath hold independently with swallowing liquids.  Difficulty masticating and transiting solid was noted - likely due to dry bolus, xerostomia and poor dentition.  He did not cough during entire MBS however and thus will follow up briefly to assure tolerance.  SLP Visit Diagnosis Dysphagia, oral phase (R13.11) Attention and concentration deficit following -- Frontal lobe and executive  function deficit following -- Impact on safety and function Mild aspiration risk   CHL IP TREATMENT RECOMMENDATION 11/03/2017 Treatment Recommendations Therapy as outlined in treatment plan below   Prognosis 11/03/2017 Prognosis for Safe Diet Advancement Good Barriers to Reach Goals -- Barriers/Prognosis Comment -- CHL IP DIET RECOMMENDATION 11/03/2017 SLP Diet Recommendations Dysphagia 3 (Mech soft) solids;Thin liquid Liquid Administration via Straw Medication Administration Other (Comment) Compensations Minimize environmental distractions;Slow rate;Small sips/bites Postural Changes Remain semi-upright after after feeds/meals (Comment);Seated upright at 90 degrees   CHL IP OTHER RECOMMENDATIONS 11/03/2017 Recommended Consults -- Oral Care Recommendations Oral care QID Other Recommendations --   CHL IP FOLLOW UP RECOMMENDATIONS 11/07/2017 Follow up Recommendations Other (comment)   CHL IP FREQUENCY AND DURATION 11/03/2017 Speech Therapy Frequency (ACUTE ONLY) min 1 x/week Treatment Duration 1 week      CHL IP ORAL PHASE 11/03/2017 Oral Phase Impaired Oral - Pudding Teaspoon -- Oral - Pudding Cup -- Oral - Honey Teaspoon -- Oral - Honey Cup -- Oral - Nectar Teaspoon WFL Oral - Nectar Cup -- Oral - Nectar Straw -- Oral - Thin Teaspoon WFL Oral - Thin Cup -- Oral - Thin Straw WFL;Piecemeal swallowing Oral - Puree WFL;Piecemeal swallowing Oral - Mech Soft -- Oral - Regular Impaired mastication;Weak lingual manipulation;Piecemeal swallowing;Delayed oral transit Oral - Multi-Consistency -- Oral - Pill WFL Oral Phase - Comment --  CHL IP PHARYNGEAL PHASE 11/03/2017 Pharyngeal Phase WFL Pharyngeal- Pudding Teaspoon -- Pharyngeal -- Pharyngeal- Pudding Cup -- Pharyngeal -- Pharyngeal- Honey Teaspoon -- Pharyngeal -- Pharyngeal- Honey Cup -- Pharyngeal -- Pharyngeal- Nectar Teaspoon -- Pharyngeal -- Pharyngeal- Nectar Cup -- Pharyngeal -- Pharyngeal- Nectar Straw -- Pharyngeal -- Pharyngeal- Thin Teaspoon -- Pharyngeal -- Pharyngeal-  Thin Cup -- Pharyngeal -- Pharyngeal- Thin Straw -- Pharyngeal -- Pharyngeal- Puree -- Pharyngeal -- Pharyngeal- Mechanical Soft -- Pharyngeal -- Pharyngeal- Regular -- Pharyngeal -- Pharyngeal- Multi-consistency -- Pharyngeal -- Pharyngeal- Pill -- Pharyngeal -- Pharyngeal Comment --  CHL IP CERVICAL ESOPHAGEAL PHASE 11/03/2017 Cervical Esophageal Phase WFL Pudding Teaspoon -- Pudding Cup -- Honey Teaspoon -- Honey Cup -- Nectar Teaspoon -- Nectar Cup -- Nectar Straw -- Thin Teaspoon -- Thin Cup -- Thin Straw -- Puree -- Mechanical Soft -- Regular -- Multi-consistency -- Pill -- Cervical Esophageal Comment -- No flowsheet data found. 11/03/2017, 9:49 AM Luanna Salk, MS [facility] SLP [phone]              Ir Thoracentesis Asp Pleural Space W/img Guide  Result Date: 11/15/2017 INDICATION: 67 year old with a right lung mass and shortness of breath. Complete opacification of the right hemithorax on recent chest radiograph. Request for right thoracentesis. EXAM: ULTRASOUND GUIDED RIGHT THORACENTESIS MEDICATIONS: None. COMPLICATIONS: None immediate. PROCEDURE: An ultrasound guided thoracentesis  was thoroughly discussed with the patient and questions answered. The benefits, risks, alternatives and complications were also discussed. The patient understands and wishes to proceed with the procedure. Written consent was obtained. Ultrasound was performed to localize and mark an adequate pocket of fluid in the right chest. The area was then prepped and draped in the normal sterile fashion. 1% Lidocaine was used for local anesthesia. Under ultrasound guidance a 6 Fr Safe-T-Centesis catheter was introduced. Thoracentesis was performed. The catheter was removed and a dressing applied. FINDINGS: A total of approximately 1.6 L of amber colored fluid was removed. IMPRESSION: Successful ultrasound guided right thoracentesis yielding 1.6 L of pleural fluid. Electronically Signed   By: Markus Daft M.D.   On: 11/15/2017 19:19      CBC Recent Labs  Lab 12/04/17 0437 11/12/17 0353 11/13/17 0541 11/14/17 0249 11/15/17 0323  WBC 11.8* 13.0* 15.2* 13.2* 13.9*  HGB 10.0* 10.7* 10.4* 10.2* 10.4*  HCT 31.2* 33.0* 32.7* 32.6* 32.3*  PLT 553* 577* 560* 575* 531*  MCV 78.6 78.4 78.6 78.6 77.3*  MCH 25.2* 25.4* 25.0* 24.6* 24.9*  MCHC 32.1 32.4 31.8 31.3 32.2  RDW 14.6 15.0 15.2 14.8 14.7    Chemistries  Recent Labs  Lab 12/04/2017 0253 11/12/17 0353 11/13/17 0541 11/14/17 0249 11/15/17 0323  NA 126* 126* 123* 122* 124*  K 4.4 4.6 3.9 3.6 3.8  CL 94* 92* 89* 90* 93*  CO2 22 20* 24 22 24   GLUCOSE 162* 166* 136* 165* 152*  BUN 8 8 8 7 10   CREATININE 0.51* 0.56* 0.66 0.55* 0.54*  CALCIUM 8.7* 8.8* 8.9 8.7* 9.0  MG 1.7 1.8 1.7 1.7 1.9   ------------------------------------------------------------------------------------------------------------------ No results for input(s): CHOL, HDL, LDLCALC, TRIG, CHOLHDL, LDLDIRECT in the last 72 hours.  Lab Results  Component Value Date   HGBA1C 6.5 (H) 11/10/2017   ------------------------------------------------------------------------------------------------------------------ No results for input(s): TSH, T4TOTAL, T3FREE, THYROIDAB in the last 72 hours.  Invalid input(s): FREET3 ------------------------------------------------------------------------------------------------------------------ No results for input(s): VITAMINB12, FOLATE, FERRITIN, TIBC, IRON, RETICCTPCT in the last 72 hours.  Coagulation profile No results for input(s): INR, PROTIME in the last 168 hours.  No results for input(s): DDIMER in the last 72 hours.  Cardiac Enzymes No results for input(s): CKMB, TROPONINI, MYOGLOBIN in the last 168 hours.  Invalid input(s): CK ------------------------------------------------------------------------------------------------------------------    Component Value Date/Time   BNP 316.0 (H) 2017-12-04 0931   anticipate Death in the hospital, if patient  lives longer may be transferred to hospice house  Roxan Hockey M.D on 11/16/2017 at 6:13 PM  Between 7am to 7pm - Pager - [phone]  After 7pm go to [email or web address] - password [email or web address]  [facility] -  Office  [phone]   Voice Recognition Viviann Spare dictation system was used to create this note, attempts have been made to correct errors. Please contact the author with questions and/or clarifications.

## 2017-11-16 NOTE — Progress Notes (Signed)
Patient was taken off BIPAP & placed on 4L [place]. Patient began to experience immediate shortness of breath. Patient was immediately unable to speak in full sentences & was using accessory muscles to breathe. Patient was placed back on BIPAP 10/5, back up rate: 10 with 40% FIO2. Patient states that his breathing is much better on BIPAP but wishes to eat and drink. RT explained to patient the risk of eating & drinking while on BIPAP & patient demonstrated understanding. Will make RN aware that patient was taken off BIPAP & did not tolerate it.

## 2017-11-16 NOTE — Progress Notes (Signed)
Occupational Therapy Discharge Patient Details Name: Wyatt Salas MRN: [id number] DOB: Jun 13, 1951 Today's Date: 11/16/2017 Time:  -     Patient discharged from OT services secondary to Medical decline and pt moving toward full comfort care. No longer appropriate to participate in OT. Pt unable to tolerate being off of BIPAP at this time.  Please see latest therapy progress note for current level of functioning and progress toward goals.    Progress and discharge plan discussed with patient and/or caregiver: Patient unable to participate in discharge planning and no caregivers available  [place], MS OTR/L  Pager: [place] 11/16/2017, 2:51 PM

## 2017-11-16 NOTE — Progress Notes (Signed)
Physical Therapy Discharge Patient Details Name: Wyatt Salas MRN: [id number] DOB: 1950/09/26 Today's Date: 11/16/2017 Time:  -     Patient discharged from PT services secondary to medical decline and pt moving toward comfort care.  Please see latest therapy progress note for current level of functioning and progress toward goals.    Progress and discharge plan discussed with patient and/or caregiver: Patient unable to participate in discharge planning and no caregivers available  GP     Shary Decamp [facility] 11/16/2017, 10:25 AM   Suanne Marker PT [phone]

## 2017-11-16 NOTE — Progress Notes (Signed)
Clinical Social Worker following patient for support. Per patients RN patient is wanting his pastor at bedside. CSW consulted [place] while patient waits for personal pastor to come to the hospital. CSW contacted patients community pastor Floyde Parkins) and requested that he please come and visit patient.  Rhea Pink, MSW,  [place]

## 2017-11-16 NOTE — Progress Notes (Addendum)
Pt wanting to get on bedpan. O2 saturation at 76% on Bipap at 40% fiO2.  Increased 100% fiO2 on bipap pt saturation at 96%. Visible inc WOB. HR 134. BP 150/104 (115). Respiratory at bedside. MD paged.  Clyde Canterbury, RN

## 2017-11-16 NOTE — Progress Notes (Signed)
Nutrition Follow Up/Brief Note  Chart reviewed. Pt now transitioning to comfort care.  No further nutrition interventions warranted at this time.  Please consult as needed.   Arthur Holms, RD, LDN Pager #: [phone] After-Hours Pager #: [phone]

## 2017-11-16 NOTE — Progress Notes (Signed)
Daily Progress Note   Patient Name: [facility]as       Date: 11/16/2017 DOB: Dec 12, 1950  Age: 67 y.o. MRN#: [id number] Attending Physician: Roxan Hockey, MD Primary Care Physician: [name], MD Admit Date: 10/20/2017  Reason for Consultation/Follow-up: Establishing goals of care and Psychosocial/spiritual support  Subjective:   [facility] is awake, currently on BIPAP.   Events over the past 24 hours noted Patient unable to come off BIPAP for any reasonable length of time, immediately de saturates into the 70's.  He is aware of his overall condition and that efforts now are being directed towards ensuring his comfort, avoiding suffering and for relief from pain and dyspnea.  There is no family at bedside, call placed, unable to reach sister Ivin Booty.  Will add low dose IV Dilaudid PRN for symptom management Anticipate transition to full comfort within the next 24 hours or so, depending on the patient's overall disease trajectory.  It remains to be seen if the patient is able to come off BIPAP sufficiently enough to be able to be discharged to residential hospice, if not, then anticipate hospital death and might need to start low dose continuous opioid infusion soon.    Length of Stay: 20  Current Medications: Scheduled Meds:  . amiodarone  400 mg Oral BID  . aspirin  81 mg Oral Daily  . atorvastatin  40 mg Oral q1800  . benzonatate  200 mg Oral BID  . digoxin  0.125 mg Oral Daily  . docusate sodium  200 mg Oral BID  . feeding supplement (ENSURE ENLIVE)  237 mL Oral BID BM  . folic acid  1 mg Oral Daily  . guaiFENesin-codeine  10 mL Oral Once  . heparin  5,000 Units Subcutaneous Q8H  . insulin aspart  0-5 Units Subcutaneous QHS  . insulin aspart  0-9 Units Subcutaneous  TID WC  . lacosamide  100 mg Oral BID  . levETIRAcetam  1,500 mg Oral BID  . metoprolol tartrate  25 mg Oral BID  . mirtazapine  15 mg Oral QHS  . multivitamin with minerals  1 tablet Oral Daily  . OLANZapine zydis  5 mg Oral Daily  . phenytoin  30 mg Oral BID  . sodium chloride flush  3 mL Intravenous Q12H  . tamsulosin  0.4 mg  Oral Daily  . thiamine  100 mg Oral Daily    Continuous Infusions: . sodium chloride Stopped (11/11/17 2300)    PRN Meds: sodium chloride, acetaminophen **OR** acetaminophen, acetaminophen, albuterol, alum & mag hydroxide-simeth, antiseptic oral rinse, bisacodyl, diltiazem, glycopyrrolate **OR** glycopyrrolate **OR** glycopyrrolate, guaiFENesin-dextromethorphan, HYDROcodone-acetaminophen, HYDROmorphone (DILAUDID) injection, LORazepam **OR** LORazepam **OR** LORazepam, LORazepam, morphine CONCENTRATE **OR** morphine CONCENTRATE, ondansetron **OR** ondansetron (ZOFRAN) IV, ondansetron **OR** ondansetron (ZOFRAN) IV, phenol, polyethylene glycol, polyvinyl alcohol, sodium chloride flush   Vital Signs: BP (!) 143/98 (BP Location: Right Arm)   Pulse (!) 135   Temp 98.4 F (36.9 C) (Axillary)   Resp (!) 30   Ht _0  (1.854 m)   Wt 64.4 kg (141 lb 15.6 oz)   SpO2 92%   BMI 18.73 kg/m  SpO2: SpO2: 92 % O2 Device: O2 Device: Bi-PAP O2 Flow Rate: O2 Flow Rate (L/min): 3 L/min Awake on BIPAP Is able to speak some, around the BIPAP Appears dyspneic S1 S2 irregular Frail weak appearing gentleman Abdomen soft No edema  Intake/output summary:   Intake/Output Summary (Last 24 hours) at 11/16/2017 1256 Last data filed at 11/16/2017 1112 Gross per 24 hour  Intake -  Output 540 ml  Net -540 ml   LBM: Last BM Date: 11/14/17 Baseline Weight: Weight: 68 kg (150 lb) Most recent weight: Weight: 64.4 kg (141 lb 15.6 oz)     PPS 30%  Palliative Assessment/Data:    Flowsheet Rows     Most Recent Value  Intake Tab  Referral Department  Hospitalist  Unit at  Time of Referral  Med/Surg Unit  Palliative Care Primary Diagnosis  Cancer  Date Notified  10/28/17  Palliative Care Type  New Palliative care  Reason for referral  Clarify Goals of Care  Date of Admission  10/18/2017  Date first seen by Palliative Care  10/29/17  # of days Palliative referral response time  1 Day(s)  # of days IP prior to Palliative referral  2  Clinical Assessment  Palliative Performance Scale Score  40%  Pain Max last 24 hours  6  Pain Min Last 24 hours  0  Psychosocial & Spiritual Assessment  Palliative Care Outcomes  Patient/Family meeting held?  Yes  Who was at the meeting?  patient  Palliative Care Outcomes  Clarified goals of care      Patient Active Problem List   Diagnosis Date Noted  . 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 Atrial flutter ([place])   . Goals of care, counseling/discussion   . Palliative care by specialist   . Cardiac/pericardial tamponade   . Acute combined systolic and diastolic heart failure ([place])   . Protein-calorie malnutrition, severe 10/28/2017  . Acute urinary retention 10/27/2017  . Atrial fibrillation with rapid ventricular response ([place]) 10/27/2017  . Chronic diastolic CHF (congestive heart failure) ([place]) 10/27/2017  . Closed fracture of distal end of left humerus 10/27/2017  . Mass of upper lobe of right lung   . New onset atrial fibrillation ([place])   . Abnormal liver function   . Lung mass   . Essential hypertension   . Altered mental status 05/18/2017  . Right sided weakness 10/14/2016  . Acute urinary tract infection 10/14/2016  . Elevated troponin 10/14/2016  . Type 2 diabetes mellitus with hyperlipidemia ([place]) 10/14/2016  . Fall at home--mechanical 01/26/2014  . Elevated CK 01/26/2014  . Seizure disorder ([place]) 07/26/2013  . NSTEMI (non-ST elevated myocardial infarction) ([place]) 07/26/2013  . Hyperlipidemia 07/26/2013  . Aspiration  pneumonia ([place]) 07/26/2013  . 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 fib with RVR.  [facility]as has undergone bronchoscopy and unfortunately was found to have non-small cell lung cancer.  On 10/18/2017, he underwent pericardiocentesis at [facility] that shows malignant cells.  He has met with oncology and is determined to have treatment for his cancer in [place], where he has family.  Hospital course complicated by ongoing A fib, low blood pressures. He also required palliative thoracentesis on 11-15-17 for large R sided pleural effusion.   In am on 11-16-17, the patient was transitioned to comfort measures and code status was modified to DNR DNI. PMT continues to follow.    Recommendations/Plan:  Add low dose IV Dilaudid  Continue current scope of comfort measures, BIPAP breaks so patient may eat/drink something.   Anticipate hospital death, might could get to residential hospice if patient is able to come off BIPAP. PMT to continue to follow  Appreciate bedside RN and chaplain support, patient is awaiting visit from his own pastor. Comfort cart for family.     Code Status:    Code Status Orders  (From admission, onward)        Start     Ordered   10/27/17 0548  Full code  Continuous     10/27/17 0549    Code Status History    Date Active Date Inactive Code Status Order ID Comments User Context   05/18/2017 2330 05/24/2017 2201 Full Code [id number]  Jani Gravel, MD Inpatient   10/14/2016 2118 10/15/2016 1921 Full Code [id number]  Toy Baker, MD Inpatient   01/25/2014 2010 01/26/2014 2035 Full Code [id number]  Corky Sox, MD Inpatient   07/27/2013 0019 07/31/2013 1533 Full Code [id number]  Orvan Falconer, MD Inpatient       Prognosis:   days.     Discharge Planning:  Anticipated hospital death versus transfer to residential hospice.   Care plan was discussed with patient, bedside RN.   Thank you for allowing the Palliative Medicine Team to assist in the care of this patient.   Total Time 35 min Prolonged Time Billed  no       Greater than 50%  of this time was spent counseling and coordinating care related to the above assessment and plan.  Loistine Chance, MD [place] Team [phone]  Please contact Palliative Medicine Team phone at [phone] for questions and concerns.

## 2017-11-16 NOTE — Progress Notes (Signed)
Bypap taken off and 4 l o2 via [place] applied to administer Pm medication. Rapid RN Shanon Brow was by bedside. Pt tolarated well. Bypap was off for approx 30 min. Pt sustained o2 sat on 96-98% on 4L o2 via [place]. Pt was able produce some sputum, looks fatigued, weak. Bypap reapplied. Call light within reach. Will continue to monitor.

## 2017-11-17 ENCOUNTER — Inpatient Hospital Stay: Payer: Self-pay

## 2017-11-17 LAB — GLUCOSE, CAPILLARY
GLUCOSE-CAPILLARY: 147 mg/dL — AB (ref 65–99)
Glucose-Capillary: 135 mg/dL — ABNORMAL HIGH (ref 65–99)

## 2017-11-17 MED ORDER — LEVETIRACETAM IN NACL 1500 MG/100ML IV SOLN
1500.0000 mg | Freq: Two times a day (BID) | INTRAVENOUS | Status: DC
  Administered 2017-11-17 – 2017-11-18 (×2): 1500 mg via INTRAVENOUS
  Filled 2017-11-17 (×4): qty 100

## 2017-11-17 MED ORDER — SODIUM CHLORIDE 0.9 % IV SOLN
100.0000 mg | Freq: Two times a day (BID) | INTRAVENOUS | Status: DC
  Administered 2017-11-17: 100 mg via INTRAVENOUS
  Filled 2017-11-17 (×4): qty 10

## 2017-11-17 MED ORDER — MORPHINE 100MG IN NS 100ML (1MG/ML) PREMIX INFUSION
1.0000 mg/h | INTRAVENOUS | Status: DC
  Administered 2017-11-17: 1 mg/h via INTRAVENOUS
  Filled 2017-11-17: qty 100

## 2017-11-17 MED ORDER — METOPROLOL TARTRATE 5 MG/5ML IV SOLN
5.0000 mg | INTRAVENOUS | Status: DC | PRN
  Filled 2017-11-17: qty 5

## 2017-11-17 MED ORDER — OLANZAPINE 5 MG PO TBDP
5.0000 mg | ORAL_TABLET | Freq: Every day | ORAL | Status: DC
  Administered 2017-11-17: 5 mg via ORAL
  Filled 2017-11-17 (×2): qty 1

## 2017-11-17 MED ORDER — MORPHINE BOLUS VIA INFUSION
1.0000 mg | INTRAVENOUS | Status: DC | PRN
  Administered 2017-11-18: 1 mg via INTRAVENOUS
  Filled 2017-11-17: qty 1

## 2017-11-17 MED ORDER — PHENYTOIN SODIUM 50 MG/ML IJ SOLN
30.0000 mg | Freq: Two times a day (BID) | INTRAMUSCULAR | Status: DC
  Administered 2017-11-17 – 2017-11-18 (×2): 30 mg via INTRAVENOUS
  Filled 2017-11-17 (×3): qty 0.6

## 2017-11-17 MED ORDER — SODIUM CHLORIDE 0.9 % IV SOLN
INTRAVENOUS | Status: DC
  Administered 2017-11-17: 13:00:00 via INTRAVENOUS

## 2017-11-17 NOTE — Progress Notes (Signed)
Dr. Rowe Pavy called to follow up on patient. She states that she is going to try and speak with patients sister about POC. MD agrees that it is unsafe for patient to take PO meds at this time.  Emelda Fear, RN

## 2017-11-17 NOTE — Progress Notes (Signed)
Per conversation with Merry Proud, RT, patient can not be moved to [place] on BIPAP per policy. Patient also states that he wants his mouth cleaned and remain on BIPAP because he "dont want to die today". Patient is alert and oriented  K. Clement Husbands, RN

## 2017-11-17 NOTE — Progress Notes (Addendum)
Palliative Medicine RN Note: AM symptom check with plan for Dr Rowe Pavy to see later today. Attending has placed order for inpt hospice.  Patient denies pain but is adamant that he wants bipap off. He is willing to get medications to be able to stay off the mask.   I spoke with RN. Patient has no IV access. RN reports sats dropped when Roxanol was given last night. She also reports that Dr Rowe Pavy ok'd holding PO medications this am.  Spoke with Dr Rowe Pavy. Patient has seizure disorder; he has not gotten his Keppra or Vimpat due to inability to take PO. She has requested an urgent PICC placement for antiepileptic infusion and for symptom management. Patient can likely not go to hospice on a bipap, and he will need IV medication to manage dyspnea if he is going to remain off the bipap. She would like Roxanol given stat and then patient be allowed off bipap for as long as he can tolerate/likes.   Please call our office once the PICC is placed.  I updated sister on her cell phone. She is leaving town tomorrow at [employer]. She would like to complete hospice paperwork today in case he is stable enough to transfer after removing bipap so that he isn't stuck in [facility] waiting for her to return in a week. Her preference is [facility], but patient REQUIRES continued IV medications. If they are unable to accommodate the IV therapies, she would like him to go the [facility] in [facility].  I will talk to [facility] [place] and provide update.  Marjie Skiff Olivene [place], RN, BSN, [facility] Palliative Medicine Team 11/17/2017 10:16 AM Office [phone]

## 2017-11-17 NOTE — Progress Notes (Signed)
Spoke with Merry Proud, RT and he will be up to see patient shortly.   Emelda Fear, RN

## 2017-11-17 NOTE — Progress Notes (Signed)
Daily Progress Note   Patient Name: Wyatt Salas       Date: 11/17/2017 DOB: [birth year]  Age: 67 y.o. MRN#: [id number] Attending Physician: Damita Lack, MD Primary Care Physician: [name], MD Admit Date: 11/07/2017  Reason for Consultation/Follow-up: Establishing goals of care and Psychosocial/spiritual support  Subjective:   W[facility] Salas is remains on BIPAP.at times, he insists on eating his meals.  See additional information below.   Length of Stay: 21  Current Medications: Scheduled Meds:  . amiodarone  400 mg Oral BID  . digoxin  0.125 mg Oral Daily  . lacosamide  100 mg Oral BID  . levETIRAcetam  1,500 mg Oral BID  . metoprolol tartrate  25 mg Oral BID  . mirtazapine  15 mg Oral QHS  . OLANZapine zydis  5 mg Oral Daily  . phenytoin  30 mg Oral BID  . sodium chloride flush  3 mL Intravenous Q12H    Continuous Infusions: . sodium chloride 10 mL/hr at 11/17/17 1321  . morphine 1 mg/hr (11/17/17 1322)    PRN Meds: acetaminophen **OR** acetaminophen, acetaminophen, albuterol, alum & mag hydroxide-simeth, antiseptic oral rinse, bisacodyl, diltiazem, glycopyrrolate **OR** glycopyrrolate **OR** glycopyrrolate, LORazepam **OR** LORazepam **OR** LORazepam, LORazepam, morphine, ondansetron **OR** ondansetron (ZOFRAN) IV, phenol, polyethylene glycol, polyvinyl alcohol, sodium chloride flush   Vital Signs: BP 101/69 (BP Location: Right Arm)   Pulse (!) 120   Temp 97.8 F (36.6 C) (Axillary)   Resp 20   Ht 6' 1" (1.854 m)   Wt 64.4 kg (141 lb 15.6 oz)   SpO2 94%   BMI 18.73 kg/m  SpO2: SpO2: 94 % O2 Device: O2 Device: Bi-PAP O2 Flow Rate: O2 Flow Rate (L/min): 3 L/min Awake on BIPAP Is able to speak some, around the BIPAP Appears dyspneic S1 S2  irregular Frail weak appearing gentleman Abdomen soft No edema Appears much more weaker and lethargic than 11-16-17  Intake/output summary:   Intake/Output Summary (Last 24 hours) at 11/17/2017 1419 Last data filed at 11/17/2017 0003 Gross per 24 hour  Intake 0 ml  Output 250 ml  Net -250 ml   LBM: Last BM Date: 11/14/17 Baseline Weight: Weight: 68 kg (150 lb) Most recent weight: Weight: 64.4 kg (141 lb 15.6 oz)     PPS 30%  Palliative Assessment/Data:  Flowsheet Rows     Most Recent Value  Intake Tab  Referral Department  Hospitalist  Unit at Time of Referral  Med/Surg Unit  Palliative Care Primary Diagnosis  Cancer  Date Notified  10/28/17  Palliative Care Type  [facility] Palliative care  Reason for referral  Clarify Goals of Care  Date of Admission  11/15/2017  Date first seen by Palliative Care  10/29/17  # of days Palliative referral response time  1 Day(s)  # of days IP prior to Palliative referral  2  Clinical Assessment  Palliative Performance Scale Score  40%  Pain Max last 24 hours  6  Pain Min Last 24 hours  0  Psychosocial & Spiritual Assessment  Palliative Care Outcomes  Patient/Family meeting held?  Yes  Who was at the meeting?  patient  Palliative Care Outcomes  Clarified goals of care      Patient Active Problem List   Diagnosis Date Noted  . Malignant pericardial effusion ([place] Salas) 11/09/2017  . Atrial flutter ([place])   . Goals of care, counseling/discussion   . Palliative care by specialist   . Cardiac/pericardial tamponade   . Acute combined systolic and diastolic heart failure ([place] Salas)   . Protein-calorie malnutrition, severe 10/28/2017  . Acute urinary retention 10/27/2017  . Atrial fibrillation with rapid ventricular response ([place] Salas) 10/27/2017  . Chronic diastolic CHF (congestive heart failure) ([place] Salas) 10/27/2017  . Closed fracture of distal end of left humerus 10/27/2017  . Mass of upper lobe of right lung   . [place] onset atrial fibrillation ([place] Salas)   .  Abnormal liver function   . Lung mass   . Essential hypertension   . Altered mental status 05/18/2017  . Right sided weakness 10/14/2016  . Acute urinary tract infection 10/14/2016  . Elevated troponin 10/14/2016  . Type 2 diabetes mellitus with hyperlipidemia ([place] Salas) 10/14/2016  . Fall at home--mechanical 01/26/2014  . Elevated CK 01/26/2014  . Seizure disorder ([place] Salas) 07/26/2013  . NSTEMI (non-ST elevated myocardial infarction) ([place] Salas) 07/26/2013  . Hyperlipidemia 07/26/2013  . Aspiration pneumonia ([place] Salas) 07/26/2013  . 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 fib with RVR.  Wyatt Salas has undergone bronchoscopy and unfortunately was found to have non-small cell lung cancer.  On 10/19/2017, he underwent pericardiocentesis at [facility] that shows malignant cells.  He has met with oncology and is determined to have treatment for his cancer in [place], where he has family.  Salas course complicated by ongoing A fib, low blood pressures. He also required palliative thoracentesis on 11-15-17 for large R sided pleural effusion.   In am on 11-16-17, the patient was transitioned to comfort measures and code status was modified to DNR DNI.   In continuation of comfort measures, continuous opioids will be initiated has midline IV has been placed. Currently requiring BiPAP primarily as a comfort measure. Okay to allow BiPAP breaks for sips of clear liquids on an as-needed basis.  PMT continues to follow.    Recommendations/Plan:  Start low dose Morphine drip, titrate to comfort.   Transfer to 6N  Transfer to residential hospice after  repeat assessment on Dec 11, 2017 if still deemed appropriate.   BIPAP for comfort measures  Continue essential medications via  IV route for now.      Code Status:    Code Status Orders  (From admission, onward)        Start     Ordered   10/27/17 0548  Full code  Continuous     10/27/17 0549    Code Status History    Date Active Date Inactive Code Status Order ID Comments User Context   05/18/2017 2330 05/24/2017 2201 Full Code [id number]  Jani Gravel, MD Inpatient   10/14/2016 2118 10/15/2016 1921 Full Code [id number]  Toy Baker, MD Inpatient   01/25/2014 2010 01/26/2014 2035 Full Code [id number]  Corky Sox, MD Inpatient   07/27/2013 0019 07/31/2013 1533 Full Code [id number]  Orvan Falconer, MD Inpatient      CODE STATUS clarified to DO NOT RESUSCITATE/DO NOT INTUBATE since 11-16-17. Prognosis:  Hours- days.     Discharge Planning:  Anticipated Salas death versus transfer to residential hospice.   Care plan was discussed with patient, bedside RN, TR[facility] MD Dr. Reesa Chew.   Thank you for allowing the Palliative Medicine Team to assist in the care of this patient.   Total Time 35 min Prolonged Time Billed  no       Greater than 50%  of this time was spent counseling and coordinating care related to the above assessment and plan.  Loistine Chance, MD [place] Team [phone]  Please contact Palliative Medicine Team phone at [phone] for questions and concerns.

## 2017-11-17 NOTE — Progress Notes (Signed)
Dr. Rowe Pavy updated on patient status.  Emelda Fear, RN

## 2017-11-17 NOTE — Progress Notes (Signed)
Spoke with Pharmacy regarding Keppra IV and Morphine IV. These medications have not been tested together. Per pharmacy when giving Keppra IV or Vimpat IV - stop the morphine IV, flush the line, run the Keppra/Vimpat, flush the line, restart the morphine. Morphine bolus available for patient as needed per MAR. Updated Lorenza Chick RN.   Fritz Pickerel, RN

## 2017-11-17 NOTE — Progress Notes (Signed)
PROGRESS NOTE    Wyatt Salas  VHQ:[id number] DOB: 19-Oct-1950 DOA: 10/27/2017 PCP: [name], MD   Brief Narrative:  67 year old male with history of multiple falls, wheelchair bound, hepatitis C, seizure disorder, remote subdural hematoma status post right-sided cranial ostomy, chronic diastolic congestive heart failure, alcohol abuse, lung cancer who was admitted to the hospital initially with atrial fibrillation and RVR.  On the chest x-ray there was concerns of right upper lobe mass initially.  He was started on Cardizem drip which was transitioned to Cardizem and then Lopressor was added.  He underwent thoracentesis and the fluid was suggestive of poorly differentiated non-small cell lung carcinoma.  He also had pericardial effusion which ended up being malignant effusion, about 750 cc which drained.  Eventually due to overall patient's poor prognosis palliative care was consulted who had a meeting with the patient and the POA about his overall condition.  It was deemed appropriate to make patient DNR/DNI with comfort care, patient and the POA was in agreement to this.   Assessment & Plan:   Principal Problem:   Atrial fibrillation with rapid ventricular response (HCC) Active Problems:   Seizure disorder (HCC)   Type 2 diabetes mellitus with hyperlipidemia (HCC)   Lung mass   Essential hypertension   Acute urinary retention   Chronic diastolic CHF (congestive heart failure) (HCC)   Closed fracture of distal end of left humerus   Protein-calorie malnutrition, severe   Acute combined systolic and diastolic heart failure (HCC)   Cardiac/pericardial tamponade   Goals of care, counseling/discussion   Palliative care by specialist   Atrial flutter ([place])   Malignant pericardial effusion (HCC)  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  Palliative care team is following the patient. - He is to be started on IV morphine and transferred to 6 N.  Currently is on BiPAP but patient makes it through will be likely transition to hospice care  Atrial flutter with RVR -Extremely difficult to control due to his underlying pulmonary condition.  At this point he is off of all the medication as he is comfort care completely.  Essential hypertension  Chronic combined systolic and diastolic congestive heart failure, class III with ejection fraction 40% and grade 2 diastolic dysfunction.  Left humerus fracture secondary to fall  Diabetes mellitus type 2  History of seizures  Urinary retention due to BPH-Foley in place for comfort sake  Generalized weakness, deconditioning and severe protein calorie malnutrition -Patient is on comfort care  Patient is extremely poor prognosis.   unlikely he can tolerate being off of BiPAP for too long but okay to take him off for comfort feeding.  I anticipate his death in the hospital but if not we will transition him to hospice house.  Appreciate palliative care input.   Consultants:   Palliative care  Cardiology  Oncology    Subjective: Patient is on BiPAP this morning, he is alert and awake.  He is insisting that he wants to eat his breakfast therefore I have requested respiratory therapy to take him off of BiPAP so he can eat even though he cannot tolerate being off of BiPAP for too long.  Review of Systems Otherwise negative except as per HPI, including: General: Denies fever, chills, night sweats or unintended weight  loss. Resp: Denies cough, wheezing, shortness of breath. Cardiac: Denies chest pain, palpitations, orthopnea, paroxysmal nocturnal dyspnea. GI: Denies abdominal pain, nausea, vomiting, diarrhea or constipation GU: Denies dysuria, frequency, hesitancy or incontinence MS: Denies muscle  aches, joint pain or swelling Neuro: Denies headache, neurologic deficits (focal weakness, numbness, tingling), abnormal gait Psych: Denies anxiety, depression, SI/HI/AVH Skin: Denies new rashes or lesions ID: Denies sick contacts, exotic exposures, travel  Objective: Vitals:   11/17/17 0735 11/17/17 0817 11/17/17 1106 11/17/17 1143  BP:  106/73 101/69   Pulse: (!) 128 (!) 119 (!) 107 (!) 120  Resp: (!) 27 14 (!) 22 20  Temp:  (!) 97.1 F (36.2 C) 97.8 F (36.6 C)   TempSrc:  Axillary Axillary   SpO2: 100% 100% 93% 94%  Weight:      Height:        Intake/Output Summary (Last 24 hours) at 11/17/2017 1221 Last data filed at 11/17/2017 0003 Gross per 24 hour  Intake 120 ml  Output 250 ml  Net -130 ml   Filed Weights   11/14/17 0303 11/15/17 0432 11/16/17 0346  Weight: 65 kg (143 lb 4.8 oz) 63.6 kg (140 lb 3.4 oz) 64.4 kg (141 lb 15.6 oz)    Examination:  General exam: Appears calm  on BiPAP, very ill-appearing, temporal wasting, cachectic and frail Respiratory system: Diffuse coarse breath sounds cardiovascular system: S1 & S2 heard, sinus tachycardia RRR. No JVD, murmurs, rubs, gallops or clicks. No pedal edema. Gastrointestinal system: Abdomen is nondistended, soft and nontender. No organomegaly or masses felt. Normal bowel sounds heard. Central nervous system: Alert and oriented. No focal neurological deficits. Extremities: Symmetric 4 x 5 power. Skin: No rashes, lesions or ulcers Psychiatry: Judgement and insight appear normal. Mood & affect appropriate.     Data Reviewed:   CBC: Recent Labs  Lab 11/11/17 0437 11/12/17 0353 11/13/17 0541 11/14/17 0249 11/15/17 0323  WBC 11.8* 13.0* 15.2* 13.2* 13.9*  HGB 10.0* 10.7* 10.4* 10.2* 10.4*  HCT 31.2* 33.0* 32.7* 32.6* 32.3*  MCV 78.6 78.4 78.6 78.6 77.3*  PLT 553* 577* 560* 575* 833*   Basic Metabolic Panel: Recent Labs  Lab 11/11/17 0253 11/12/17 0353 11/13/17 0541 11/14/17 0249 11/15/17 0323  NA 126* 126*  123* 122* 124*  K 4.4 4.6 3.9 3.6 3.8  CL 94* 92* 89* 90* 93*  CO2 22 20* 24 22 24   GLUCOSE 162* 166* 136* 165* 152*  BUN 8 8 8 7 10   CREATININE 0.51* 0.56* 0.66 0.55* 0.54*  CALCIUM 8.7* 8.8* 8.9 8.7* 9.0  MG 1.7 1.8 1.7 1.7 1.9   GFR: Estimated Creatinine Clearance: 80.5 mL/min (A) (by C-G formula based on SCr of 0.54 mg/dL (L)). Liver Function Tests: No results for input(s): AST, ALT, ALKPHOS, BILITOT, PROT, ALBUMIN in the last 168 hours. No results for input(s): LIPASE, AMYLASE in the last 168 hours. No results for input(s): AMMONIA in the last 168 hours. Coagulation Profile: No results for input(s): INR, PROTIME in the last 168 hours. Cardiac Enzymes: No results for input(s): CKTOTAL, CKMB, CKMBINDEX, TROPONINI in the last 168 hours. BNP (last 3 results) No results for input(s): PROBNP in the last 8760 hours. HbA1C: No results for input(s): HGBA1C in the last 72 hours. CBG: Recent Labs  Lab 11/16/17 1049 11/16/17 1640 11/16/17 2233 11/17/17 0632 11/17/17 1104  GLUCAP 189* 154* 127* 147* 135*   Lipid Profile: No results for input(s): CHOL, HDL, LDLCALC, TRIG, CHOLHDL, LDLDIRECT in the last 72 hours. Thyroid Function Tests: No  results for input(s): TSH, T4TOTAL, FREET4, T3FREE, THYROIDAB in the last 72 hours. Anemia Panel: No results for input(s): VITAMINB12, FOLATE, FERRITIN, TIBC, IRON, RETICCTPCT in the last 72 hours. Sepsis Labs: No results for input(s): PROCALCITON, LATICACIDVEN in the last 168 hours.  No results found for this or any previous visit (from the past 240 hour(s)).       Radiology Studies: Dg Chest 1 View  Result Date: 11/15/2017 CLINICAL DATA:  Right thoracentesis EXAM: CHEST  1 VIEW COMPARISON:  Yesterday FINDINGS: Diminished right pleural effusion with some aerated lung now seen at the right base. Layering left pleural effusion and basal parenchymal opacity. Stable mediastinal contours. No visible pneumothorax. IMPRESSION: No acute finding  after right thoracentesis. Aerated lung is newly seen at the right base. Electronically Signed   By: Monte Fantasia M.D.   On: 11/15/2017 18:27   [place] Ekg Site Rite  Result Date: 11/17/2017 If Site Rite image not attached, placement could not be confirmed due to current cardiac rhythm.  Ir Thoracentesis Asp Pleural Space W/img Guide  Result Date: 11/15/2017 INDICATION: 67 year old with a right lung mass and shortness of breath. Complete opacification of the right hemithorax on recent chest radiograph. Request for right thoracentesis. EXAM: ULTRASOUND GUIDED RIGHT THORACENTESIS MEDICATIONS: None. COMPLICATIONS: None immediate. PROCEDURE: An ultrasound guided thoracentesis was thoroughly discussed with the patient and questions answered. The benefits, risks, alternatives and complications were also discussed. The patient understands and wishes to proceed with the procedure. Written consent was obtained. Ultrasound was performed to localize and mark an adequate pocket of fluid in the right chest. The area was then prepped and draped in the normal sterile fashion. 1% Lidocaine was used for local anesthesia. Under ultrasound guidance a 6 Fr Safe-T-Centesis catheter was introduced. Thoracentesis was performed. The catheter was removed and a dressing applied. FINDINGS: A total of approximately 1.6 L of amber colored fluid was removed. IMPRESSION: Successful ultrasound guided right thoracentesis yielding 1.6 L of pleural fluid. Electronically Signed   By: Markus Daft M.D.   On: 11/15/2017 19:19        Scheduled Meds: . amiodarone  400 mg Oral BID  . aspirin  81 mg Oral Daily  . atorvastatin  40 mg Oral q1800  . benzonatate  200 mg Oral BID  . digoxin  0.125 mg Oral Daily  . docusate sodium  200 mg Oral BID  . feeding supplement (ENSURE ENLIVE)  237 mL Oral BID BM  . folic acid  1 mg Oral Daily  . guaiFENesin-codeine  10 mL Oral Once  . heparin  5,000 Units Subcutaneous Q8H  . lacosamide  100 mg Oral  BID  . levETIRAcetam  1,500 mg Oral BID  . metoprolol tartrate  25 mg Oral BID  . mirtazapine  15 mg Oral QHS  . multivitamin with minerals  1 tablet Oral Daily  . OLANZapine zydis  5 mg Oral Daily  . phenytoin  30 mg Oral BID  . sodium chloride flush  3 mL Intravenous Q12H  . tamsulosin  0.4 mg Oral Daily  . thiamine  100 mg Oral Daily   Continuous Infusions: . sodium chloride Stopped (11/11/17 2300)     LOS: 21 days    Time spent: 25 mins    Yeraldy Spike Arsenio Loader, MD [facility] Pager [phone]   If 7PM-7AM, please contact night-coverage [email or web address] Password [email or web address] 11/17/2017, 12:21 PM

## 2017-11-17 NOTE — Progress Notes (Signed)
Starr RN and I started morphine gtt at 13mL/hr per order. Educated to ask for bolus dose if needed. NS running at 10 mL/hr. Both infusing through R upper extremity midline.   Fritz Pickerel, RN

## 2017-11-17 NOTE — Progress Notes (Signed)
Oral hygiene done at this time. Also fed patient a few bites of italian ice and sips of broth per his request.

## 2017-11-17 NOTE — Progress Notes (Signed)
Palliative Medicine RN Note: Afternoon symptom check. Patient is asleep with even and unlabored respirations; I did not wake him.  Spoke with RN Lorenza Chick; pt remained on PO medications. I pulled up his medication list with Dr Rowe Pavy on the phone. She gave verbal orders to adjust medications to IV as we are able.  Wyatt Salas, who did want to d/c his bipap earlier, has decided today is not the day he is ready to stop it and die. At this time, he will remain on the bipap. Dr Rowe Pavy expressed an expectation that he will likely rapidly decline despite remaining on bipap, and she will see him again tomorrow.  Wyatt Skiff Oluwasemilore Bahl, RN, BSN, [facility] Palliative Medicine Team 11/17/2017 4:12 PM Office [phone]

## 2017-11-17 DEATH — deceased

## 2017-11-23 ENCOUNTER — Other Ambulatory Visit: Payer: Medicare HMO

## 2017-11-23 ENCOUNTER — Inpatient Hospital Stay: Payer: Medicare HMO | Admitting: Internal Medicine

## 2017-12-18 NOTE — Death Summary Note (Signed)
Death Summary  MONTRELL CESSNA MWU:[id number] DOB: [birth year] DOA: 30-Oct-2017  PCP: [name], MD  Admit date: Oct 30, 2017 Date of Death: 11/22/2017 Time of Death: 2:35pm Notification: [name], MD notified of death of 11-22-2017   History of present illness/Hospital Stay:  67 year old male with history of multiple falls, wheelchair bound, hepatitis C, seizure disorder, remote subdural hematoma status post right-sided cranial ostomy, chronic diastolic congestive heart failure, alcohol abuse, lung cancer who was admitted to the hospital initially with atrial fibrillation and RVR.  On the chest x-ray there was concerns of right upper lobe mass initially.  He was started on Cardizem drip which was transitioned to Cardizem and then Lopressor was added.  He underwent thoracentesis and the fluid was suggestive of poorly differentiated non-small cell lung carcinoma.  He also had pericardial effusion which ended up being malignant effusion, about 750 cc which drained.  Eventually due to overall patient's poor prognosis palliative care was consulted who had a meeting with the patient and the POA about his overall condition.  It was deemed appropriate to make patient DNR/DNI with comfort care, patient and the POA was in agreement to this.  On physical exam: Patient was unresponsive to verbal and physical stimuli.  No gag reflex, pupillary reflex or corneal reflex was present.  Heart sounds and breath sounds were absent.   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  Palliative care team is following the patient. - He is to be started on IV morphine and transferred to 6 N.  Was in BiPAP but did not tolerate being off BiPAP. BiPAp was taken off at patients request.     Atrial flutter with RVR -Extremely difficult to control due to his underlying pulmonary condition.  At this point he is off of all the medication as he is comfort care completely.  Essential hypertension  Chronic combined systolic and diastolic congestive heart failure, class III with ejection fraction 40% and grade 2 diastolic dysfunction.  Left humerus fracture secondary to fall  Diabetes mellitus type 2  History of seizures  Urinary retention due to BPH-Foley in place for comfort sake  Generalized weakness, deconditioning and severe protein calorie malnutrition -Patient is on comfort care  End Of Life care -titrate morphine drip prn, only IV Medications. Appreciate chaplin support. Appreciate Palliative care team.   Family Member (POA was informed)  Code Status= DNR    Final/Principal Diagnoses:  1.   Acute Respiratory failure  2.  Lung carcinoma with metastases 3. Atrial fibrillation with rapid ventricular response 4. Essential hypertension 5. Combined systolic and diastolic congestive heart failure  Disposition/Follow up Care: Patient is deceased.   Discharge medications: None  The results of significant diagnostics from this hospitalization (including imaging, microbiology, ancillary and laboratory) are listed below for reference.    Significant Diagnostic Studies: Ct Abdomen Pelvis Wo Contrast  Result Date: 10/30/17 CLINICAL DATA:  Left shoulder and forehead pain after fall this morning. Lung mass. EXAM: CT CHEST, ABDOMEN, AND PELVIS WITHOUT CONTRAST TECHNIQUE: Multidetector CT imaging of the chest, abdomen and pelvis was performed without IV contrast. CONTRAST:  The patient's IV infiltrated during imaging and the order was changed to CT of the chest, abdomen and pelvis without per ordering clinician. COMPARISON:  PET-CT 05/31/2017 FINDINGS: CT CHEST FINDINGS Cardiovascular:  Top-normal size heart with left main and three-vessel coronary  arteriosclerosis. Aortic atherosclerosis without aneurysm. The unenhanced pulmonary vasculature is difficult to assess beyond the main pulmonary artery due to soft tissue masses and adenopathy about the right hilum and mediastinum. Mediastinum/Nodes: Interval increase in masslike abnormalities in the mediastinum and right hilum compatible with progression of metastatic adenopathy and/or increase and extension of known right upper lobe mass. Largest lymph node identified is subcarinal at 2.8 cm. Interval increase in right upper paratracheal lymph node measuring 1.1 cm. Exact margins of additional lymph node masses are difficult to delineate on this unenhanced study. There is luminal narrowing of the distal right mainstem bronchus without occlusion secondary to the right hilar, mediastinal and subcarinal masslike abnormalities. The thyroid gland is unremarkable. The esophagus is not well visualized. Lungs/Pleura: Partially loculated small to moderate right effusion. New pulmonary nodular masslike opacities are noted within the right upper, middle and lower lobes with interval progression in size of spiculated right upper lobe mass now measuring approximately 5.8 x 5.3 x 5 cm. Within the superior segment of the aerated right lower lobe are metastatic nodules measuring up to 2.2 cm. Additional nodules are seen in the right middle and anterior right lower lobe. Left lung remains clear. Nodular thickening along the left major fissure is seen which may represent fluid in the fissure versus an intra fissural mass. This measures 2.2 x 1.2 x 1.7 cm. Peribronchial thickening is noted within both lower lobes. Musculoskeletal: Remote right posterior seventh through ninth rib fractures. No suspicious osseous lesions. Extravasated contrast noted in the soft tissues of the right arm. CT ABDOMEN PELVIS FINDINGS Hepatobiliary: No biliary dilatation. The unenhanced liver is unremarkable. The gallbladder is physiologically distended.  Pancreas: No ductal dilatation or inflammation. Spleen: No splenomegaly or mass. Adrenals/Urinary Tract: No adrenal mass. Interpolar 2.5 cm simple right renal cyst. Unremarkable noncontrast appearance of the left kidney. No obstructive uropathy. Marked bladder distention to the umbilicus with the bladder measuring 14.7 x 14.1 x 18.8 cm (volume = 2040 cm^3). Stomach/Bowel: Stomach is within normal limits. Appendix appears normal. No evidence of bowel wall thickening, distention, or inflammatory changes. Vascular/Lymphatic: Moderate aortoiliac and branch vessel atherosclerosis. No aneurysm. No adenopathy. Reproductive: Normal size prostate. Other: No free air nor free fluid. Musculoskeletal: Degenerative disc disease L5-S1. No aggressive osseous lesions. IMPRESSION: Extravasation of IV contrast into the patient's right arm. Study was flagged for contrast extravasation radiology PA follow-up should the patient be discharged. Chest CT: 1. Interval increase in spiculated right upper lobe mass now measuring approximately 5.8 x 5.3 x 5 cm with progression of metastatic mediastinal and hilar adenopathy as well as development of satellite pulmonary nodules in the right lung. Slight luminal narrowing of the distal right mainstem bronchus due to adenopathy. No occlusion noted however. 2. Ovoid density in the left major fissure may represent a pseudo lesion with fluid in the fissure versus an intra fissural mass measuring 2.2 x 1.2 x 1.7 cm. 3. Loculated moderate right pleural effusion. Abdomen and pelvic CT: 1. Marked urinary distention with volume of approximately 2 L. 2. Simple right interpolar renal cyst measuring 2.5 cm. 3. No apparent evidence of metastatic disease within the abdomen and pelvis given limitations of a noncontrast exam. Electronically Signed   By: Ashley Royalty M.D.   On: 11/01/2017 22:55   Dg Chest 1 View  Result Date: 11/15/2017 CLINICAL DATA:  Right thoracentesis EXAM: CHEST  1 VIEW COMPARISON:   Yesterday FINDINGS: Diminished right pleural effusion with some  aerated lung now seen at the right base. Layering left pleural effusion and basal parenchymal opacity. Stable mediastinal contours. No visible pneumothorax. IMPRESSION: No acute finding after right thoracentesis. Aerated lung is newly seen at the right base. Electronically Signed   By: Monte Fantasia M.D.   On: 11/15/2017 18:27   Dg Chest 2 View  Result Date: 11/08/2017 CLINICAL DATA:  Cough, history of lung cancer. EXAM: CHEST - 2 VIEW COMPARISON:  05/18/2017 chest CT, CXR 05/18/2017 FINDINGS: Interval increase in size of masslike opacity in the right upper lobe, best appreciated on the sagittal view measuring approximately 4 x 3.5 x 4.8 cm with right paratracheal and perihilar soft tissue prominence concerning for progression of lymphadenopathy. Superimposed adjacent pneumonia is difficult to entirely exclude. Hyperinflated left lung without pulmonary consolidation or dominant mass. Heart size is top-normal. No aortic aneurysm. No suspicious osseous lesions. IMPRESSION: 1. Interval increase in size of right upper lobe posterior masslike abnormality now estimated at 4 x 3.5 x 4.8 cm, previously estimated at approximately 3.6 cm. 2. Interval increase in soft tissue prominence about the mediastinum and right hilum compatible with metastatic lymphadenopathy. Superimposed adjacent airspace opacities cannot exclude pneumonia and/or areas of atelectasis. Electronically Signed   By: Ashley Royalty M.D.   On: 10/24/2017 19:37   Dg Elbow Complete Left (3+view)  Result Date: 10/29/2017 CLINICAL DATA:  Elbow fracture. EXAM: LEFT ELBOW - COMPLETE 3+ VIEW; LEFT FOREARM - 2 VIEW COMPARISON:  Left elbow and forearm x-rays dated September 20, 2017. FINDINGS: Left elbow: Unchanged mild cortical step-off along the medial aspect of the capitellum. No new fracture. Persistent elbow joint effusion. Osteopenia. Soft tissues are unremarkable. Left forearm: No radius or ulna  fracture. No focal bone lesion. Osteopenia. Soft tissues are unremarkable. IMPRESSION: 1. Unchanged minimally displaced fracture of the capitellum. Persistent elbow joint effusion. 2. No acute abnormality of the forearm. Electronically Signed   By: Titus Dubin M.D.   On: 10/29/2017 13:17   Dg Forearm Left  Result Date: 10/29/2017 CLINICAL DATA:  Elbow fracture. EXAM: LEFT ELBOW - COMPLETE 3+ VIEW; LEFT FOREARM - 2 VIEW COMPARISON:  Left elbow and forearm x-rays dated September 20, 2017. FINDINGS: Left elbow: Unchanged mild cortical step-off along the medial aspect of the capitellum. No new fracture. Persistent elbow joint effusion. Osteopenia. Soft tissues are unremarkable. Left forearm: No radius or ulna fracture. No focal bone lesion. Osteopenia. Soft tissues are unremarkable. IMPRESSION: 1. Unchanged minimally displaced fracture of the capitellum. Persistent elbow joint effusion. 2. No acute abnormality of the forearm. Electronically Signed   By: Titus Dubin M.D.   On: 10/29/2017 13:17   Ct Head Wo Contrast  Result Date: 11/06/2017 CLINICAL DATA:  [place] [address] out of wheelchair today. Left forehead and left shoulder pain. Initial encounter. EXAM: CT HEAD WITHOUT CONTRAST CT CERVICAL SPINE WITHOUT CONTRAST TECHNIQUE: Multidetector CT imaging of the head and cervical spine was performed following the standard protocol without intravenous contrast. Multiplanar CT image reconstructions of the cervical spine were also generated. COMPARISON:  09/21/2017 FINDINGS: CT HEAD FINDINGS Brain: Right MCA territory encephalomalacia is unchanged as is more extensive right temporal lobe encephalomalacia which may be in part postsurgical. There is marked cerebral scratched of there is marked cerebellar atrophy. Periventricular white matter hypodensities are unchanged and nonspecific but compatible with mild chronic small vessel ischemic disease. No acute large territory infarct, intracranial hemorrhage, mass, midline shift, or  extra-axial fluid collection is identified. Vascular: Calcified atherosclerosis at the skull base. No hyperdense vessel. Skull: Prior  right pterional craniotomy.  No acute fracture. Sinuses/Orbits: The visualized paranasal sinuses and mastoid air cells are clear. The orbits are unremarkable. Other: None. CT CERVICAL SPINE FINDINGS Alignment: Chronic reversal of the normal cervical lordosis. Unchanged grade 1 anterolisthesis of C2 on C3 and C3 on C4. Skull base and vertebrae: No acute fracture or suspicious osseous lesion. Soft tissues and spinal canal: No prevertebral fluid or swelling. No visible canal hematoma. Disc levels: Moderate diffuse cervical disc degeneration and moderate to severe left greater than right mid upper cervical facet arthrosis resulting in advanced multilevel neural foraminal stenosis. Upper chest: Right pleural effusion. Unchanged 3 mm left apical lung nodule. Other: Moderate calcified atherosclerosis at the carotid bifurcations. IMPRESSION: 1. No evidence of acute intracranial abnormality. 2. Right cerebral hemisphere encephalomalacia. Severe cerebellar atrophy. 3. No acute cervical spine fracture. Electronically Signed   By: Logan Bores M.D.   On: 10/25/2017 19:51   Ct Chest Wo Contrast  Result Date: 10/23/2017 CLINICAL DATA:  Left shoulder and forehead pain after fall this morning. Lung mass. EXAM: CT CHEST, ABDOMEN, AND PELVIS WITHOUT CONTRAST TECHNIQUE: Multidetector CT imaging of the chest, abdomen and pelvis was performed without IV contrast. CONTRAST:  The patient's IV infiltrated during imaging and the order was changed to CT of the chest, abdomen and pelvis without per ordering clinician. COMPARISON:  PET-CT 05/31/2017 FINDINGS: CT CHEST FINDINGS Cardiovascular: Top-normal size heart with left main and three-vessel coronary arteriosclerosis. Aortic atherosclerosis without aneurysm. The unenhanced pulmonary vasculature is difficult to assess beyond the main pulmonary artery due  to soft tissue masses and adenopathy about the right hilum and mediastinum. Mediastinum/Nodes: Interval increase in masslike abnormalities in the mediastinum and right hilum compatible with progression of metastatic adenopathy and/or increase and extension of known right upper lobe mass. Largest lymph node identified is subcarinal at 2.8 cm. Interval increase in right upper paratracheal lymph node measuring 1.1 cm. Exact margins of additional lymph node masses are difficult to delineate on this unenhanced study. There is luminal narrowing of the distal right mainstem bronchus without occlusion secondary to the right hilar, mediastinal and subcarinal masslike abnormalities. The thyroid gland is unremarkable. The esophagus is not well visualized. Lungs/Pleura: Partially loculated small to moderate right effusion. New pulmonary nodular masslike opacities are noted within the right upper, middle and lower lobes with interval progression in size of spiculated right upper lobe mass now measuring approximately 5.8 x 5.3 x 5 cm. Within the superior segment of the aerated right lower lobe are metastatic nodules measuring up to 2.2 cm. Additional nodules are seen in the right middle and anterior right lower lobe. Left lung remains clear. Nodular thickening along the left major fissure is seen which may represent fluid in the fissure versus an intra fissural mass. This measures 2.2 x 1.2 x 1.7 cm. Peribronchial thickening is noted within both lower lobes. Musculoskeletal: Remote right posterior seventh through ninth rib fractures. No suspicious osseous lesions. Extravasated contrast noted in the soft tissues of the right arm. CT ABDOMEN PELVIS FINDINGS Hepatobiliary: No biliary dilatation. The unenhanced liver is unremarkable. The gallbladder is physiologically distended. Pancreas: No ductal dilatation or inflammation. Spleen: No splenomegaly or mass. Adrenals/Urinary Tract: No adrenal mass. Interpolar 2.5 cm simple right  renal cyst. Unremarkable noncontrast appearance of the left kidney. No obstructive uropathy. Marked bladder distention to the umbilicus with the bladder measuring 14.7 x 14.1 x 18.8 cm (volume = 2040 cm^3). Stomach/Bowel: Stomach is within normal limits. Appendix appears normal. No evidence of bowel wall thickening,  distention, or inflammatory changes. Vascular/Lymphatic: Moderate aortoiliac and branch vessel atherosclerosis. No aneurysm. No adenopathy. Reproductive: Normal size prostate. Other: No free air nor free fluid. Musculoskeletal: Degenerative disc disease L5-S1. No aggressive osseous lesions. IMPRESSION: Extravasation of IV contrast into the patient's right arm. Study was flagged for contrast extravasation radiology PA follow-up should the patient be discharged. Chest CT: 1. Interval increase in spiculated right upper lobe mass now measuring approximately 5.8 x 5.3 x 5 cm with progression of metastatic mediastinal and hilar adenopathy as well as development of satellite pulmonary nodules in the right lung. Slight luminal narrowing of the distal right mainstem bronchus due to adenopathy. No occlusion noted however. 2. Ovoid density in the left major fissure may represent a pseudo lesion with fluid in the fissure versus an intra fissural mass measuring 2.2 x 1.2 x 1.7 cm. 3. Loculated moderate right pleural effusion. Abdomen and pelvic CT: 1. Marked urinary distention with volume of approximately 2 L. 2. Simple right interpolar renal cyst measuring 2.5 cm. 3. No apparent evidence of metastatic disease within the abdomen and pelvis given limitations of a noncontrast exam. Electronically Signed   By: Ashley Royalty M.D.   On: 10/18/2017 22:55   Ct Cervical Spine Wo Contrast  Result Date: 11/08/2017 CLINICAL DATA:  [place] [address] out of wheelchair today. Left forehead and left shoulder pain. Initial encounter. EXAM: CT HEAD WITHOUT CONTRAST CT CERVICAL SPINE WITHOUT CONTRAST TECHNIQUE: Multidetector CT imaging of the  head and cervical spine was performed following the standard protocol without intravenous contrast. Multiplanar CT image reconstructions of the cervical spine were also generated. COMPARISON:  09/21/2017 FINDINGS: CT HEAD FINDINGS Brain: Right MCA territory encephalomalacia is unchanged as is more extensive right temporal lobe encephalomalacia which may be in part postsurgical. There is marked cerebral scratched of there is marked cerebellar atrophy. Periventricular white matter hypodensities are unchanged and nonspecific but compatible with mild chronic small vessel ischemic disease. No acute large territory infarct, intracranial hemorrhage, mass, midline shift, or extra-axial fluid collection is identified. Vascular: Calcified atherosclerosis at the skull base. No hyperdense vessel. Skull: Prior right pterional craniotomy.  No acute fracture. Sinuses/Orbits: The visualized paranasal sinuses and mastoid air cells are clear. The orbits are unremarkable. Other: None. CT CERVICAL SPINE FINDINGS Alignment: Chronic reversal of the normal cervical lordosis. Unchanged grade 1 anterolisthesis of C2 on C3 and C3 on C4. Skull base and vertebrae: No acute fracture or suspicious osseous lesion. Soft tissues and spinal canal: No prevertebral fluid or swelling. No visible canal hematoma. Disc levels: Moderate diffuse cervical disc degeneration and moderate to severe left greater than right mid upper cervical facet arthrosis resulting in advanced multilevel neural foraminal stenosis. Upper chest: Right pleural effusion. Unchanged 3 mm left apical lung nodule. Other: Moderate calcified atherosclerosis at the carotid bifurcations. IMPRESSION: 1. No evidence of acute intracranial abnormality. 2. Right cerebral hemisphere encephalomalacia. Severe cerebellar atrophy. 3. No acute cervical spine fracture. Electronically Signed   By: Logan Bores M.D.   On: 10/22/2017 19:51   Dg Chest Right Decubitus  Result Date: 11/08/2017 CLINICAL  DATA:  Pleural effusion. EXAM: CHEST - RIGHT DECUBITUS COMPARISON:  11/07/2017 FINDINGS: Decubital radiograph, right side down demonstrates layering left pleural effusion. There is a known right pleural effusion. Poor aeration of the right lung likely due to compression, large pulmonary masses and loculated right pleural effusion. IMPRESSION: Layering left pleural effusion. Loculated right pleural effusion.  Poor aeration of the right lung. Electronically Signed   By: Linwood Dibbles.D.  On: 11/08/2017 21:36   Dg Chest Port 1 View  Result Date: 11/14/2017 CLINICAL DATA:  Dyspnea. EXAM: PORTABLE CHEST 1 VIEW COMPARISON:  11/08/2017 and older exams. FINDINGS: There is complete opacification of the right hemithorax. Hazy airspace consolidation is noted in the left lower lung. The left hemidiaphragm is obscured. This appears mildly increased from the most recent prior exams. No pneumothorax. IMPRESSION: 1. Complete opacification of the right hemithorax. This is consistent with a large pleural effusion in combination with the known right lung mass as well as atelectasis. 2. Opacity in the left lower lung consistent with a combination of a left pleural effusion with atelectasis or possibly pneumonia. This has mildly increased from the prior exam. Electronically Signed   By: Lajean Manes M.D.   On: 11/14/2017 19:09   Dg Chest Port 1 View  Result Date: 11/07/2017 CLINICAL DATA:  Pleural effusion. EXAM: PORTABLE CHEST 1 VIEW COMPARISON:  Multiple prior studies including CT 10/28/2017 FINDINGS: The heart margins are obscured by significant opacity in the RIGHT hemithorax. A RIGHT-sided small bore pleural drain is in place, overlying the heart and RIGHT lung base. There is increased opacity in the RIGHT hemithorax, consistent with pleural effusion and parenchymal opacity. There is volume loss in the RIGHT lung. LEFT basilar opacity partially obscures the hemidiaphragm. Multiple LEFT-sided skin folds. No  pneumothorax. There are distended loops of bowel in the LEFT UPPER QUADRANT the abdomen. Remote bilateral rib fractures. IMPRESSION: 1. Increased opacity in the RIGHT lung. 2. Stable LEFT basilar opacity. Electronically Signed   By: Nolon Nations M.D.   On: 11/07/2017 10:49   Dg Chest Port 1 View  Result Date: 10/18/2017 CLINICAL DATA:  67 year old male with a history of cough and shortness of breath EXAM: PORTABLE CHEST 1 VIEW COMPARISON:  Multiple prior chest x-rays most recent 11/01/2017, 10/29/2017, 10/28/2017, prior chest CT 10/19/2017 FINDINGS: Cardiomediastinal silhouette unchanged in size and contour, with heart borders partially obscured by lung/pleural disease. Significant right rotation of the patient somewhat limits evaluation, however, there is increasing opacity at the right lung with mixed interstitial, interlobular septal opacities, and airspace opacity with evidence of pleural effusion. Increasing opacity along the upper right mediastinal border/paratracheal border. No pneumothorax. Left lung remains relatively well aerated. IMPRESSION: Increasing opacity of the right lung, likely a combination of airspace opacity/consolidation, lung volume loss, pleural effusion, and tumor in this patient with prior cross-sectional imaging demonstrating lung mass. Given the increasing airspace opacity, differential includes endobronchial tumor/debris leading to partial volume loss. Correlation with bronchoscopy may be useful. Electronically Signed   By: Corrie Mckusick D.O.   On: 10/25/2017 13:37   Dg Chest Port 1 View  Result Date: 11/01/2017 CLINICAL DATA:  Shortness of breath EXAM: PORTABLE CHEST 1 VIEW COMPARISON:  10/29/2017 FINDINGS: Cardiac shadow is stable. The left lung remains clear. Old rib fractures on the left are noted. Large skin fold is noted over the left mid lung. Persistent increased density is noted in the right upper lobe consistent with the known underlying mass lesion. Patchy  infiltrative changes are noted in the right middle lobe as well. Old rib fractures on the right are seen. IMPRESSION: Stable right upper lobe mass lesion with associated patchy infiltrates similar to that noted on prior CT examination. No large pleural effusion is seen. Electronically Signed   By: Inez Catalina M.D.   On: 11/01/2017 09:53   Dg Chest Port 1 View  Result Date: 10/29/2017 CLINICAL DATA:  Status post thoracentesis. EXAM: PORTABLE CHEST 1  VIEW COMPARISON:  Chest radiograph 10/19/2017 FINDINGS: Negative for pneumothorax following thoracentesis. There may be a small amount of right pleural fluid present. Extensive airspace densities throughout the right lung particularly in the perihilar regions. The degree of right lung consolidation may have slightly increased. Left lung remains clear. Heart size is grossly stable. Old right rib fractures. IMPRESSION: Negative for pneumothorax following thoracentesis. Extensive airspace disease and consolidation in the right lung and cannot exclude progression since prior examination. Electronically Signed   By: Markus Daft M.D.   On: 10/29/2017 10:23   Dg Shoulder Left  Result Date: 11/01/2017 CLINICAL DATA:  [place] striking the left side today. Recent distal humeral fracture. EXAM: LEFT SHOULDER - 2+ VIEW COMPARISON:  Left humerus series of Nov 17, 2009 FINDINGS: There is chronic deformity of the distal clavicle. No acute clavicular fracture is observed. The scapula and proximal humerus appear normal. There is narrowing of the glenohumeral joint space. IMPRESSION: There is no acute fracture or dislocation of the left shoulder. 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 There is chronic deformity of the distal clavicle. Electronically Signed   By: David  Martinique M.D.   On: 10/25/2017 11:20   Dg Foot Complete Left  Result Date: 11/01/2017 CLINICAL DATA:  Left heel pain. EXAM: LEFT FOOT - COMPLETE 3+ VIEW COMPARISON:  09/21/2017. FINDINGS:  Osteopenia. Mild degenerative changes at the first metatarsophalangeal joint with hallux valgus. Single screw traverses the posterior calcaneus with adjacent bony overgrowth. No acute osseous abnormality. IMPRESSION: 1. Postoperative changes in the calcaneus without acute osseous abnormality. 2. Mild first metatarsophalangeal joint osteoarthritis. Electronically Signed   By: Lorin Picket M.D.   On: 11/01/2017 09:57   Dg Swallowing Func-speech Pathology  Result Date: 11/03/2017 Objective Swallowing Evaluation: Type of Study: MBS-Modified Barium Swallow Study  Patient Details Name: SHAYDON LEASE MRN: [id number] Date of Birth: [birth year] Today's Date: 11/03/2017 Time: SLP Start Time (ACUTE ONLY): 0910 -SLP Stop Time (ACUTE ONLY): 0930 SLP Time Calculation (min) (ACUTE ONLY): 20 min Past Medical History: Past Medical History: Diagnosis Date . Diabetes mellitus without complication ([place])   Type II . Diastolic dysfunction   a. ECHO 07/2013 EF 55-60% and grade 1 diastolic dysfunction . NSTEMI (non-ST elevated myocardial infarction) ([place])   problem list 01/2014 . Rhabdomyolysis 01/2014 . Seizures ([place])  . Syncope 01/2014 . Traumatic brain injury [facility])  Past Surgical History: Past Surgical History: Procedure Laterality Date . CRANIOTOMY   . VIDEO BRONCHOSCOPY Bilateral 10/29/2017  Procedure: VIDEO BRONCHOSCOPY WITH FLUORO;  Surgeon: Collene Gobble, MD;  Location: [facility];  Service: Cardiopulmonary;  Laterality: Bilateral; HPI: 67 yo male adm to [facility] with AMS - Pt found to have right lung mass, TBI, SDH, ETOH use, seizures, essentially wheelchair bound - s/p falls with left humerus fx and ankle injury.  Pt is s/p thoracentesis 10/29/17 and bronchoscopy 10/28/2017 with findings abnormal cells.  Swallow eval ordered.   Imaging studies showed right cerebral encephalomalacia and severe cerebellar atrophy.   Pt underwent BSE and was found to be high aspiration risk, MBS ordered.   Subjective: pt awake in chair Assessment /  Plan / Recommendation CHL IP CLINICAL IMPRESSIONS 11/03/2017 Clinical Impression Pt presents with mild oral dysphagia and intact pharyngeal swallow ability.  NO aspiration or penetration noted during MBS.  Pt demonstrates extended breath hold independently with swallowing liquids.  Difficulty masticating and transiting solid was noted - likely due to dry bolus, xerostomia and poor dentition.  He did not cough during entire  MBS however and thus will follow up briefly to assure tolerance.  SLP Visit Diagnosis Dysphagia, oral phase (R13.11) Attention and concentration deficit following -- Frontal lobe and executive function deficit following -- Impact on safety and function Mild aspiration risk   CHL IP TREATMENT RECOMMENDATION 11/03/2017 Treatment Recommendations Therapy as outlined in treatment plan below   Prognosis 11/03/2017 Prognosis for Safe Diet Advancement Good Barriers to Reach Goals -- Barriers/Prognosis Comment -- CHL IP DIET RECOMMENDATION 11/03/2017 SLP Diet Recommendations Dysphagia 3 (Mech soft) solids;Thin liquid Liquid Administration via Straw Medication Administration Other (Comment) Compensations Minimize environmental distractions;Slow rate;Small sips/bites Postural Changes Remain semi-upright after after feeds/meals (Comment);Seated upright at 90 degrees   CHL IP OTHER RECOMMENDATIONS 11/03/2017 Recommended Consults -- Oral Care Recommendations Oral care QID Other Recommendations --   CHL IP FOLLOW UP RECOMMENDATIONS 10/28/2017 Follow up Recommendations Other (comment)   CHL IP FREQUENCY AND DURATION 11/03/2017 Speech Therapy Frequency (ACUTE ONLY) min 1 x/week Treatment Duration 1 week      CHL IP ORAL PHASE 11/03/2017 Oral Phase Impaired Oral - Pudding Teaspoon -- Oral - Pudding Cup -- Oral - Honey Teaspoon -- Oral - Honey Cup -- Oral - Nectar Teaspoon WFL Oral - Nectar Cup -- Oral - Nectar Straw -- Oral - Thin Teaspoon WFL Oral - Thin Cup -- Oral - Thin Straw WFL;Piecemeal swallowing Oral - Puree  WFL;Piecemeal swallowing Oral - Mech Soft -- Oral - Regular Impaired mastication;Weak lingual manipulation;Piecemeal swallowing;Delayed oral transit Oral - Multi-Consistency -- Oral - Pill WFL Oral Phase - Comment --  CHL IP PHARYNGEAL PHASE 11/03/2017 Pharyngeal Phase WFL Pharyngeal- Pudding Teaspoon -- Pharyngeal -- Pharyngeal- Pudding Cup -- Pharyngeal -- Pharyngeal- Honey Teaspoon -- Pharyngeal -- Pharyngeal- Honey Cup -- Pharyngeal -- Pharyngeal- Nectar Teaspoon -- Pharyngeal -- Pharyngeal- Nectar Cup -- Pharyngeal -- Pharyngeal- Nectar Straw -- Pharyngeal -- Pharyngeal- Thin Teaspoon -- Pharyngeal -- Pharyngeal- Thin Cup -- Pharyngeal -- Pharyngeal- Thin Straw -- Pharyngeal -- Pharyngeal- Puree -- Pharyngeal -- Pharyngeal- Mechanical Soft -- Pharyngeal -- Pharyngeal- Regular -- Pharyngeal -- Pharyngeal- Multi-consistency -- Pharyngeal -- Pharyngeal- Pill -- Pharyngeal -- Pharyngeal Comment --  CHL IP CERVICAL ESOPHAGEAL PHASE 11/03/2017 Cervical Esophageal Phase WFL Pudding Teaspoon -- Pudding Cup -- Honey Teaspoon -- Honey Cup -- Nectar Teaspoon -- Nectar Cup -- Nectar Straw -- Thin Teaspoon -- Thin Cup -- Thin Straw -- Puree -- Mechanical Soft -- Regular -- Multi-consistency -- Pill -- Cervical Esophageal Comment -- No flowsheet data found. 11/03/2017, 9:49 AM Luanna Salk, MS [facility] SLP [phone]              [place] Ekg Site Rite  Result Date: 11/17/2017 If Site Rite image not attached, placement could not be confirmed due to current cardiac rhythm.  Ir Thoracentesis Asp Pleural Space W/img Guide  Result Date: 11/15/2017 INDICATION: 67 year old with a right lung mass and shortness of breath. Complete opacification of the right hemithorax on recent chest radiograph. Request for right thoracentesis. EXAM: ULTRASOUND GUIDED RIGHT THORACENTESIS MEDICATIONS: None. COMPLICATIONS: None immediate. PROCEDURE: An ultrasound guided thoracentesis was thoroughly discussed with the patient and questions answered. The  benefits, risks, alternatives and complications were also discussed. The patient understands and wishes to proceed with the procedure. Written consent was obtained. Ultrasound was performed to localize and mark an adequate pocket of fluid in the right chest. The area was then prepped and draped in the normal sterile fashion. 1% Lidocaine was used for local anesthesia. Under ultrasound guidance a 6 Fr Safe-T-Centesis catheter was introduced. Thoracentesis  was performed. The catheter was removed and a dressing applied. FINDINGS: A total of approximately 1.6 L of amber colored fluid was removed. IMPRESSION: Successful ultrasound guided right thoracentesis yielding 1.6 L of pleural fluid. Electronically Signed   By: Markus Daft M.D.   On: 11/15/2017 19:19    Microbiology: No results found for this or any previous visit (from the past 240 hour(s)).   Labs: Basic Metabolic Panel: Recent Labs  Lab 11/12/17 0353 11/13/17 0541 11/14/17 0249 11/15/17 0323  NA 126* 123* 122* 124*  K 4.6 3.9 3.6 3.8  CL 92* 89* 90* 93*  CO2 20* 24 22 24   GLUCOSE 166* 136* 165* 152*  BUN 8 8 7 10   CREATININE 0.56* 0.66 0.55* 0.54*  CALCIUM 8.8* 8.9 8.7* 9.0  MG 1.8 1.7 1.7 1.9   Liver Function Tests: No results for input(s): AST, ALT, ALKPHOS, BILITOT, PROT, ALBUMIN in the last 168 hours. No results for input(s): LIPASE, AMYLASE in the last 168 hours. No results for input(s): AMMONIA in the last 168 hours. CBC: Recent Labs  Lab 11/12/17 0353 11/13/17 0541 11/14/17 0249 11/15/17 0323  WBC 13.0* 15.2* 13.2* 13.9*  HGB 10.7* 10.4* 10.2* 10.4*  HCT 33.0* 32.7* 32.6* 32.3*  MCV 78.4 78.6 78.6 77.3*  PLT 577* 560* 575* 531*   Cardiac Enzymes: No results for input(s): CKTOTAL, CKMB, CKMBINDEX, TROPONINI in the last 168 hours. D-Dimer No results for input(s): DDIMER in the last 72 hours. BNP: Invalid input(s): POCBNP CBG: Recent Labs  Lab 11/16/17 1049 11/16/17 1640 11/16/17 2233 11/17/17 0632  11/17/17 1104  GLUCAP 189* 154* 127* 147* 135*   Anemia work up No results for input(s): VITAMINB12, FOLATE, FERRITIN, TIBC, IRON, RETICCTPCT in the last 72 hours. Urinalysis    Component Value Date/Time   COLORURINE AMBER (A) 11/03/2017 2311   APPEARANCEUR CLEAR 11/15/2017 2311   LABSPEC 1.026 10/18/2017 2311   PHURINE 5.0 10/24/2017 2311   GLUCOSEU NEGATIVE 11/08/2017 2311   HGBUR NEGATIVE 11/04/2017 2311   BILIRUBINUR NEGATIVE 10/29/2017 2311   KETONESUR NEGATIVE 11/16/2017 2311   PROTEINUR 30 (A) 11/10/2017 2311   UROBILINOGEN 1.0 01/25/2014 1700   NITRITE NEGATIVE 11/01/2017 2311   LEUKOCYTESUR NEGATIVE 11/14/2017 2311   Sepsis Labs Invalid input(s): PROCALCITONIN,  WBC,  LACTICIDVEN  I have spent 55 minutes face to face encounter with the patient and on the ward discussing the patients care, assessment, plan and disposition with other care givers. >50% of the time was devoted  coordinating care.    SIGNED:  Damita Lack, MD  [facility] 2017/11/24, 2:49 PM Pager   If 7PM-7AM, please contact night-coverage [email or web address] Password [email or web address]

## 2017-12-18 NOTE — Progress Notes (Signed)
Patient asking repeatedly to have bipap mask removed.  Nurse and RT explained to patient that he will not maintain his saturations off of the bipap and likely will pass away.  Patient states that he understands this and still wants the bipap mask taken off now. Family member called at this time to explain the situation. Nephew who lives in [place] spoke to patient on the phone and asked him to leave bipap mask on until nephew can get to the bedside.  Patient is alert and oriented and adamant that bipap mask be removed.  Patient requests a drink of water also. Rebreather mask applied for patient's comfort by RT.  Will continue to monitor.

## 2017-12-18 NOTE — Care Management Note (Signed)
Case Management Note Marvetta Gibbons RN, BSN Unit 4E-Case Manager-- [place] coverage [phone]  Patient Details  Name: PRASHANT GLOSSER MRN: [id number] Date of Birth: May 09, 1951  Subjective/Objective:  Pt  presented secondary to a fall, found to be in atrial fibrillation with RVR. 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 Biopsy pending.  S/p pericardiocentesis on 4/19.                 Action/Plan: PTA pt lived at home- per PT eval recommendation for SNF- CSW following for placement needs-   Expected Discharge Date:   12-06-17              Expected Discharge Plan:  [place]  In-House Referral:  Clinical Social Work  Discharge planning Services  CM Consult  Post Acute Care Choice:    Choice offered to:     DME Arranged:    DME Agency:     HH Arranged:    [place] Agency:     Status of Service:  Completed, signed off  If discussed at [employer] of Stay Meetings, dates discussed:    Discharge Disposition: expired   Additional Comments:  12/06/2017- 1500- Marvetta Gibbons RN, CM-  Pt declined on 4/30 with respiratory condition and made DNR- further decline on 5/1- and placed on Morphine drip- pt taken off BIPAP this afternoon and expired.   Dawayne Patricia, RN 12/06/2017, 3:04 PM

## 2017-12-18 NOTE — Progress Notes (Signed)
Removed PT from BIpap at his request  Placed PT on a nonrebreather and a 15l HFNC

## 2017-12-18 NOTE — Progress Notes (Signed)
Daily Progress Note   Patient Name: Wyatt Salas       Date: Dec 06, 2017 DOB: [birth year]  Age: 67 y.o. MRN#: [id number] Attending Physician: Damita Lack, MD Primary Care Physician: [name], MD Admit Date: 11/03/2017  Reason for Consultation/Follow-up: Establishing goals of care and Psychosocial/spiritual support  Subjective:   Wyatt Salas appears weaker, is transitioning further and further into the dying process, at times, how ever, he is fully alert, able to hear and respond appropriately.   Discussed with bedside RN See additional information below.   Length of Stay: 22  Current Medications: Scheduled Meds:  . OLANZapine zydis  5 mg Oral QHS  . phenytoin (DILANTIN) IV  30 mg Intravenous BID  . sodium chloride flush  3 mL Intravenous Q12H    Continuous Infusions: . sodium chloride 10 mL/hr at 11/17/17 1321  . lacosamide (VIMPAT) IV Stopped (11/17/17 2250)  . levETIRAcetam Stopped (12-06-2017 0615)  . morphine 1 mg/hr (11/17/17 1900)    PRN Meds: albuterol, antiseptic oral rinse, bisacodyl, diltiazem, [DISCONTINUED] glycopyrrolate **OR** glycopyrrolate **OR** glycopyrrolate, [DISCONTINUED] LORazepam **OR** [DISCONTINUED] LORazepam **OR** LORazepam, LORazepam, metoprolol tartrate, morphine, ondansetron **OR** ondansetron (ZOFRAN) IV, phenol, polyethylene glycol, polyvinyl alcohol, sodium chloride flush   Vital Signs: BP (!) 88/71   Pulse (!) 127   Temp 97.7 F (36.5 C) (Axillary)   Resp 17   Ht '6\' 1"'$  (1.854 m)   Wt 64.4 kg (141 lb 15.6 oz)   SpO2 (!) 89%   BMI 18.73 kg/m  SpO2: SpO2: (!) 89 % O2 Device: O2 Device: Bi-PAP O2 Flow Rate: O2 Flow Rate (L/min): 3 L/min Awake on BIPAP Is able to speak some, around the BIPAP Appears dyspneic, using accessory  muscles.  S1 S2 irregular, tachycardic on monitor, requiring IV Cardizem prn.  Frail weak appearing gentleman Abdomen soft No edema Appears much more weaker and lethargic than 5/1-19  Intake/output summary:   Intake/Output Summary (Last 24 hours) at 12/06/2017 1020 Last data filed at 11/17/2017 2120 Gross per 24 hour  Intake 61.13 ml  Output 200 ml  Net -138.87 ml   LBM: Last BM Date: 11/14/17 Baseline Weight: Weight: 68 kg (150 lb) Most recent weight: Weight: 64.4 kg (141 lb 15.6 oz)     PPS 20%  Palliative Assessment/Data:    Flowsheet Rows  Most Recent Value  Intake Tab  Referral Department  Hospitalist  Unit at Time of Referral  Med/Surg Unit  Palliative Care Primary Diagnosis  Cancer  Date Notified  10/28/17  Palliative Care Type  New Palliative care  Reason for referral  Clarify Goals of Care  Date of Admission  11/12/2017  Date first seen by Palliative Care  10/29/17  # of days Palliative referral response time  1 Day(s)  # of days IP prior to Palliative referral  2  Clinical Assessment  Palliative Performance Scale Score  40%  Pain Max last 24 hours  6  Pain Min Last 24 hours  0  Psychosocial & Spiritual Assessment  Palliative Care Outcomes  Patient/Family meeting held?  Yes  Who was at the meeting?  patient  Palliative Care Outcomes  Clarified goals of care      Patient Active Problem List   Diagnosis Date Noted  . 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 Atrial flutter ([place])   . Goals of care, counseling/discussion   . Palliative care by specialist   . Cardiac/pericardial tamponade   . Acute combined systolic and diastolic heart failure ([place])   . Protein-calorie malnutrition, severe 10/28/2017  . Acute urinary retention 10/27/2017  . Atrial fibrillation with rapid ventricular response ([place]) 10/27/2017  . Chronic diastolic CHF (congestive heart failure) ([place]) 10/27/2017  . Closed fracture of distal end of left humerus 10/27/2017  . Mass  of upper lobe of right lung   . New onset atrial fibrillation ([place])   . Abnormal liver function   . Lung mass   . Essential hypertension   . Altered mental status 05/18/2017  . Right sided weakness 10/14/2016  . Acute urinary tract infection 10/14/2016  . Elevated troponin 10/14/2016  . Type 2 diabetes mellitus with hyperlipidemia ([place]) 10/14/2016  . Fall at home--mechanical 01/26/2014  . Elevated CK 01/26/2014  . Seizure disorder ([place]) 07/26/2013  . NSTEMI (non-ST elevated myocardial infarction) ([place]) 07/26/2013  . Hyperlipidemia 07/26/2013  . Aspiration pneumonia ([place]) 07/26/2013  . 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 fib with RVR.  Wyatt Salas has undergone bronchoscopy and unfortunately was found to have non-small cell lung cancer.  On 11/10/2017, he underwent pericardiocentesis at [facility] that shows malignant cells.  He has met with oncology and is determined to have treatment for his cancer in [place], where he has family.  Hospital course complicated by ongoing A fib, low blood pressures. He also required palliative thoracentesis on 11-15-17 for large R sided pleural effusion.   In am on 11-16-17, the patient was transitioned to comfort measures and code status was modified to DNR DNI.   In continuation of comfort measures, continuous opioids will be initiated has midline IV has been placed. Currently requiring BiPAP primarily as a comfort measure. Okay to allow BiPAP breaks for sips of clear liquids on an as-needed basis. Oral intake is declining, patient with severe acute desaturations once off BIPAP even for few  minutes now, worsening functional status, overall advancing in the dying process stages.   PMT continues to follow.    Recommendations/Plan:  As discussed with bedside RN, continue to titrate low dose Morphine drip, titrate to comfort.  Patient is awaiting a visit with his family, recommend chaplain support as well.   D/C Lacosamide, continue with Keppra IV pushes.   BIPAP for comfort measures  Continue essential medications via IV route for now.      Code Status:    Code Status Orders  (From admission, onward)        Start     Ordered   10/27/17 0548  Full code  Continuous     10/27/17 0549    Code Status History    Date Active Date Inactive Code Status Order ID Comments User Context   05/18/2017 2330 05/24/2017 2201 Full Code [id number]  Jani Gravel, MD Inpatient   10/14/2016 2118 10/15/2016 1921 Full Code [id number]  Toy Baker, MD Inpatient   01/25/2014 2010 01/26/2014 2035 Full Code [id number]  Corky Sox, MD Inpatient   07/27/2013 0019 07/31/2013 1533 Full Code [id number]  Orvan Falconer, MD Inpatient      CODE STATUS clarified to DO NOT RESUSCITATE/DO NOT INTUBATE since 11-16-17. Prognosis:  Hours- days.     Discharge Planning:  Anticipated hospital death    Care plan was discussed with patient, bedside RN.   Thank you for allowing the Palliative Medicine Team to assist in the care of this patient.   Total Time 35 min Prolonged Time Billed  no       Greater than 50%  of this time was spent counseling and coordinating care related to the above assessment and plan.  Loistine Chance, MD [place] Team [phone]  Please contact Palliative Medicine Team phone at [phone] for questions and concerns.

## 2017-12-18 NOTE — Progress Notes (Signed)
PROGRESS NOTE    Wyatt Salas  GEX:[id number] DOB: 19-Dec-1950 DOA: 11/11/2017 PCP: [name], MD   Brief Narrative:  67 year old male with history of multiple falls, wheelchair bound, hepatitis C, seizure disorder, remote subdural hematoma status post right-sided cranial ostomy, chronic diastolic congestive heart failure, alcohol abuse, lung cancer who was admitted to the hospital initially with atrial fibrillation and RVR.  On the chest x-ray there was concerns of right upper lobe mass initially.  He was started on Cardizem drip which was transitioned to Cardizem and then Lopressor was added.  He underwent thoracentesis and the fluid was suggestive of poorly differentiated non-small cell lung carcinoma.  He also had pericardial effusion which ended up being malignant effusion, about 750 cc which drained.  Eventually due to overall patient's poor prognosis palliative care was consulted who had a meeting with the patient and the POA about his overall condition.  It was deemed appropriate to make patient DNR/DNI with comfort care, patient and the POA was in agreement to this.   Assessment & Plan:   Principal Problem:   Atrial fibrillation with rapid ventricular response (HCC) Active Problems:   Seizure disorder (HCC)   Type 2 diabetes mellitus with hyperlipidemia (HCC)   Lung mass   Essential hypertension   Acute urinary retention   Chronic diastolic CHF (congestive heart failure) (HCC)   Closed fracture of distal end of left humerus   Protein-calorie malnutrition, severe   Acute combined systolic and diastolic heart failure (HCC)   Cardiac/pericardial tamponade   Goals of care, counseling/discussion   Palliative care by specialist   Atrial flutter ([place])   Malignant pericardial effusion (HCC)  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  Palliative care team is following the patient. - He is to be started on IV morphine and transferred to 6 N.  Currently is on BiPAP but patient makes it through will be likely transition to hospice care  Atrial flutter with RVR -Extremely difficult to control due to his underlying pulmonary condition.  At this point he is off of all the medication as he is comfort care completely.  Essential hypertension  Chronic combined systolic and diastolic congestive heart failure, class III with ejection fraction 40% and grade 2 diastolic dysfunction.  Left humerus fracture secondary to fall  Diabetes mellitus type 2  History of seizures  Urinary retention due to BPH-Foley in place for comfort sake  Generalized weakness, deconditioning and severe protein calorie malnutrition -Patient is on comfort care  End Of Life care -titrate morphine drip prn, only IV Medications. Appreciate chaplin support. Appreciate Palliative care team.   Patient is extremely poor prognosis.      Consultants:   Palliative care  Cardiology  Oncology    Subjective: Patient appears weaker but no new complaints.  Spoke with his nephew over the phone. He is aware of his condition.  Unable to tolerate much being off bipap.   Review of Systems Otherwise negative except as per HPI, including: HEENT/EYES = negative for pain, redness, loss of vision, double vision, blurred vision, loss of hearing, sore throat, hoarseness, dysphagia Cardiovascular= negative for chest pain, palpitation, murmurs, lower extremity swelling Respiratory/lungs= negative for shortness of breath, cough, hemoptysis, wheezing, mucus production Gastrointestinal= negative for nausea, vomiting,, abdominal pain, melena, hematemesis  Genitourinary= negative for Dysuria, Hematuria, Change in Urinary Frequency MSK = Negative for arthralgia, myalgias, Back  Pain, Joint swelling  Neurology= Negative for headache, seizures, numbness, tingling  Psychiatry= Negative for anxiety, depression, suicidal and homocidal ideation Allergy/Immunology= Medication/Food allergy as listed  Skin= Negative for Rash, lesions, ulcers, itching   Objective: Vitals:   11/22/2017 0300 2017/11/22 0333 Nov 22, 2017 0905 11/22/17 1238  BP:  92/73 (!) 88/71   Pulse: (!) 138  (!) 127 (!) 137  Resp:  (!) 26 17 (!) 25  Temp:  97.7 F (36.5 C)    TempSrc:  Axillary    SpO2: 92% (!) 83% (!) 89% (!) 84%  Weight:      Height:        Intake/Output Summary (Last 24 hours) at 2017/11/22 1316 Last data filed at 11-22-2017 0800 Gross per 24 hour  Intake 61.13 ml  Output 200 ml  Net -138.87 ml   Filed Weights   11/14/17 0303 11/15/17 0432 11/16/17 0346  Weight: 65 kg (143 lb 4.8 oz) 63.6 kg (140 lb 3.4 oz) 64.4 kg (141 lb 15.6 oz)    Examination:  Constitutional: Currently patient is on BiPAP very weak appearing, temporal wasting, cachectic and frail Eyes: PERRL, lids and conjunctivae normal ENMT: Mucous membranes are moist. Posterior pharynx clear of any exudate or lesions.Normal dentition.  Neck: normal, supple, no masses, no thyromegaly Respiratory: Diffuse coarse breath sounds Cardiovascular: Regular rate and rhythm, no murmurs / rubs / gallops. No extremity edema. 2+ pedal pulses. No carotid bruits.  Abdomen: no tenderness, no masses palpated. No hepatosplenomegaly. Bowel sounds positive.  Musculoskeletal: no clubbing / cyanosis. No joint deformity upper and lower extremities. Good ROM, no contractures. Normal muscle tone.  Skin: no rashes, lesions, ulcers. No induration Neurologic: CN 2-12 grossly intact. Sensation intact, DTR normal. Strength 5/5 in all 4.  Psychiatric: Normal judgment and insight. Alert and oriented x 3. Normal mood.     Data Reviewed:   CBC: Recent Labs  Lab 11/12/17 0353 11/13/17 0541 11/14/17 0249 11/15/17 0323  WBC 13.0* 15.2* 13.2*  13.9*  HGB 10.7* 10.4* 10.2* 10.4*  HCT 33.0* 32.7* 32.6* 32.3*  MCV 78.4 78.6 78.6 77.3*  PLT 577* 560* 575* 235*   Basic Metabolic Panel: Recent Labs  Lab 11/12/17 0353 11/13/17 0541 11/14/17 0249 11/15/17 0323  NA 126* 123* 122* 124*  K 4.6 3.9 3.6 3.8  CL 92* 89* 90* 93*  CO2 20* 24 22 24   GLUCOSE 166* 136* 165* 152*  BUN 8 8 7 10   CREATININE 0.56* 0.66 0.55* 0.54*  CALCIUM 8.8* 8.9 8.7* 9.0  MG 1.8 1.7 1.7 1.9   GFR: Estimated Creatinine Clearance: 80.5 mL/min (A) (by C-G formula based on SCr of 0.54 mg/dL (L)). Liver Function Tests: No results for input(s): AST, ALT, ALKPHOS, BILITOT, PROT, ALBUMIN in the last 168 hours. No results for input(s): LIPASE, AMYLASE in the last 168 hours. No results for input(s): AMMONIA in the last 168 hours. Coagulation Profile: No results for input(s): INR, PROTIME in the last 168 hours. Cardiac Enzymes: No results for input(s): CKTOTAL, CKMB, CKMBINDEX, TROPONINI in the last 168 hours. BNP (last 3 results) No results for input(s): PROBNP in the last 8760 hours. HbA1C: No results for input(s): HGBA1C in the last 72 hours. CBG: Recent Labs  Lab 11/16/17 1049 11/16/17 1640 11/16/17 2233 11/17/17 0632 11/17/17 1104  GLUCAP 189* 154* 127* 147* 135*   Lipid Profile: No results for input(s): CHOL, HDL, LDLCALC, TRIG, CHOLHDL, LDLDIRECT in  the last 72 hours. Thyroid Function Tests: No results for input(s): TSH, T4TOTAL, FREET4, T3FREE, THYROIDAB in the last 72 hours. Anemia Panel: No results for input(s): VITAMINB12, FOLATE, FERRITIN, TIBC, IRON, RETICCTPCT in the last 72 hours. Sepsis Labs: No results for input(s): PROCALCITON, LATICACIDVEN in the last 168 hours.  No results found for this or any previous visit (from the past 240 hour(s)).       Radiology Studies: [place] Ekg Site Rite  Result Date: 11/17/2017 If [facility] image not attached, placement could not be confirmed due to current cardiac  rhythm.       Scheduled Meds: . OLANZapine zydis  5 mg Oral QHS  . phenytoin (DILANTIN) IV  30 mg Intravenous BID  . sodium chloride flush  3 mL Intravenous Q12H   Continuous Infusions: . sodium chloride 10 mL/hr at 11/17/17 1321  . levETIRAcetam Stopped (11-19-17 0615)  . morphine 1 mg/hr (11/17/17 1900)     LOS: 22 days    Time spent: 15 mins    Ankit Arsenio Loader, MD [facility] Pager [phone]   If 7PM-7AM, please contact night-coverage [email or web address] Password [email or web address] Nov 19, 2017, 1:16 PM

## 2017-12-18 NOTE — Progress Notes (Signed)
   12-11-17 1435  Notifications  Bed Control notified that Post Mortem checklist is complete Yes  Attending [place]  Attending Physician Notified Y  Attending Physician Name Brien Mates the above attending physician sign death certificate?  (non ED physician) Yes  Post Mortem Checklist  Date of Death Dec 11, 2017  Time of Death 10/20/33  Pronounced By RN Nicanor Bake, RN Francee Gentile  Next of kin notified Yes  Name of next of kin notified of death Donterrius Santucci  Contact Person's Relationship to Patient Other relative  Contact Person's Phone Number [phone]  Family Communication Notes Family aware and not coming to hospital  Was the patient a No Code Blue or a Limited Code Blue? Yes  Did the patient die unattended? No  Patient restrained? Not applicable  Weight 97.6 kg (141 lb 15.6 oz)  Body preparation complete Y  [place] [facility]  Notification Date 12-11-17  Notification Time [place] [facility] Number [phone]  Is patient a potential donor? N  Patient Belongings/Medications Returned  Patient belongings from bedside/safe/pharmacy returned  Yes  Medical Examiner  Is this a medical examiner's case? N

## 2017-12-18 NOTE — Progress Notes (Signed)
   2017/12/10 1300  Clinical Encounter Type  Visited With Patient  Visit Type Initial  Referral From Nurse  Consult/Referral To Chaplain  Spiritual Encounters  Spiritual Needs Prayer;Emotional  Stress Factors  Patient Stress Factors Exhausted  Family Stress Factors None identified    Pt was laying on his bed with oxygen mask on and alert. No family member on-site. Oxygen therapy nurse and direct care nurse were present. Pt was struggling with his breath and so it was hard for any further conversations. Chaplain provided empathic and compassionate presence and prayer.  Yara Tomkinson a Medical sales representative, [employer]

## 2017-12-18 NOTE — Plan of Care (Signed)

## 2017-12-18 DEATH — deceased

## 2019-05-06 IMAGING — US US ABDOMEN LIMITED
1 series · 14 of 25 positions shown · non-contrast
Comparison: None.

CLINICAL DATA: Elevated LFTs

EXAM:
ULTRASOUND ABDOMEN LIMITED RIGHT UPPER QUADRANT

[Series 1: us abdomen limited · 0.24mm/px · 14 of 53 slices shown]
[im 1/53]
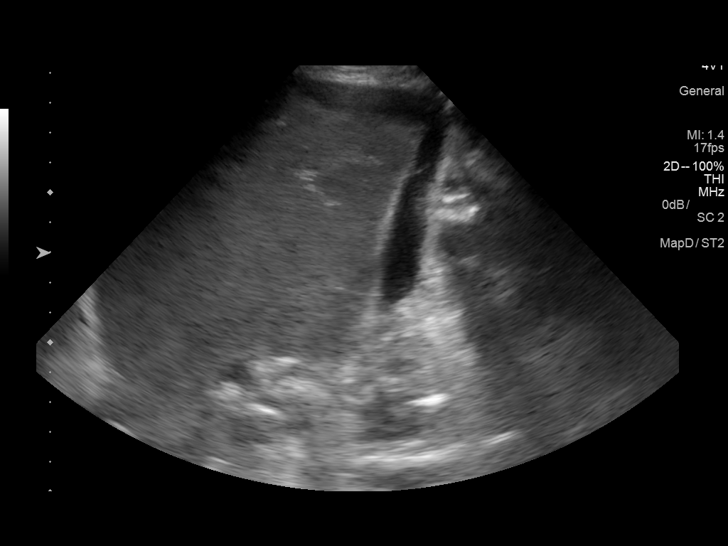
[im 5/53]
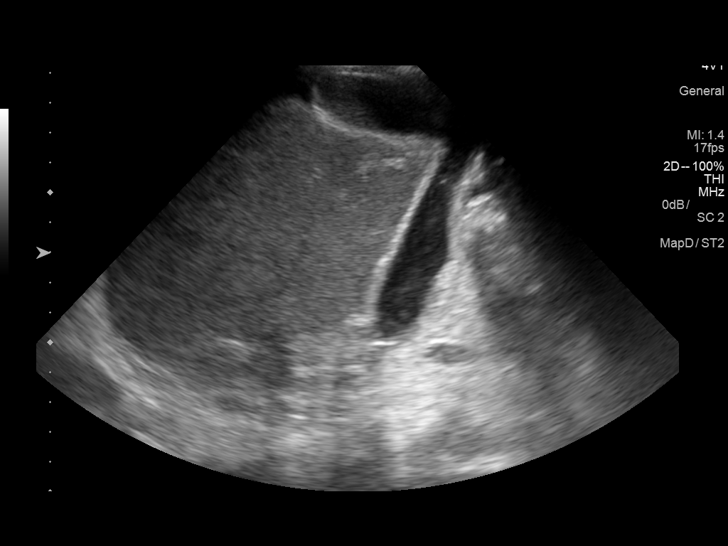
[im 9/53]
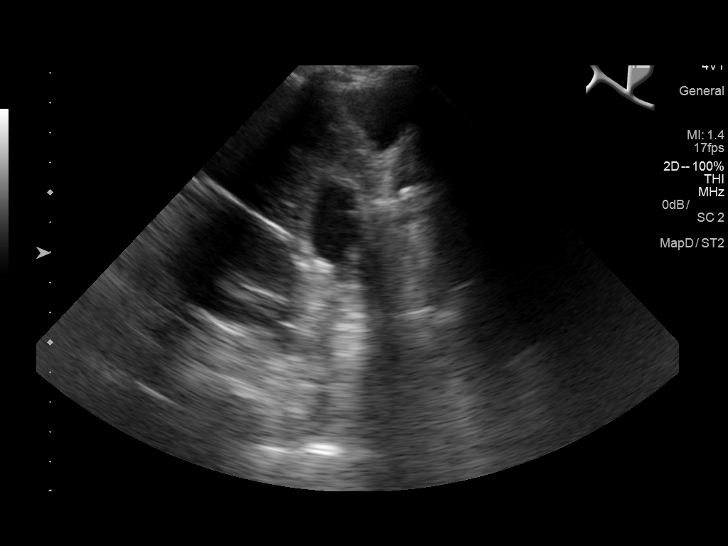
[im 14/53]
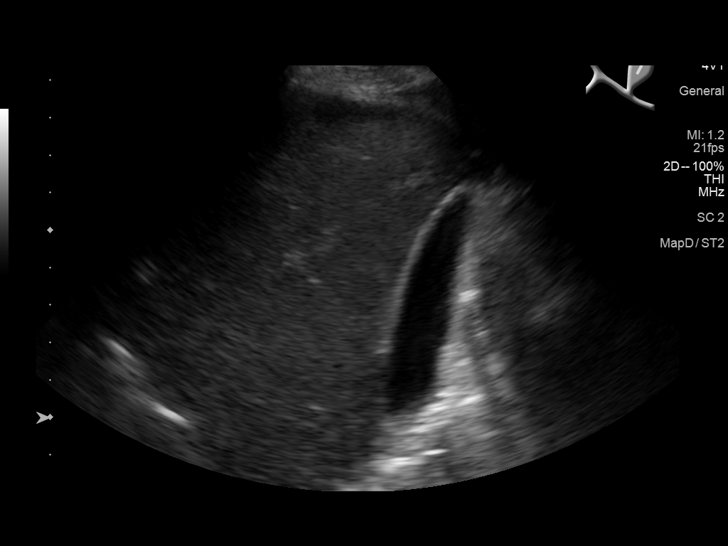
[im 18/53]
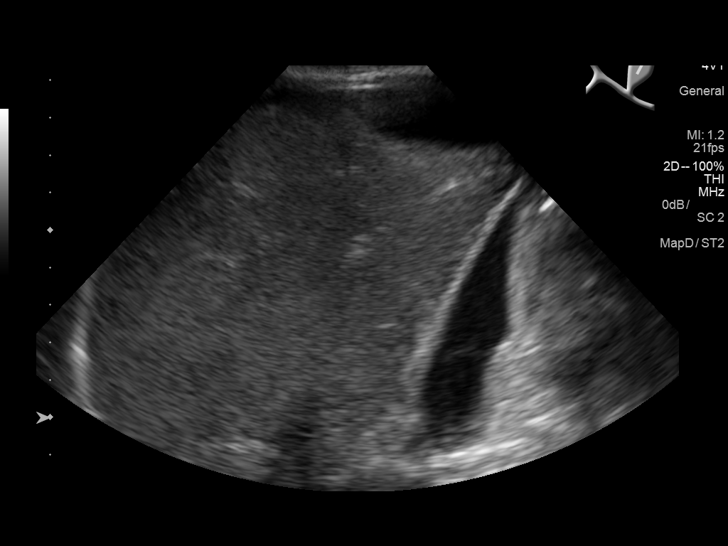
[im 20/53]
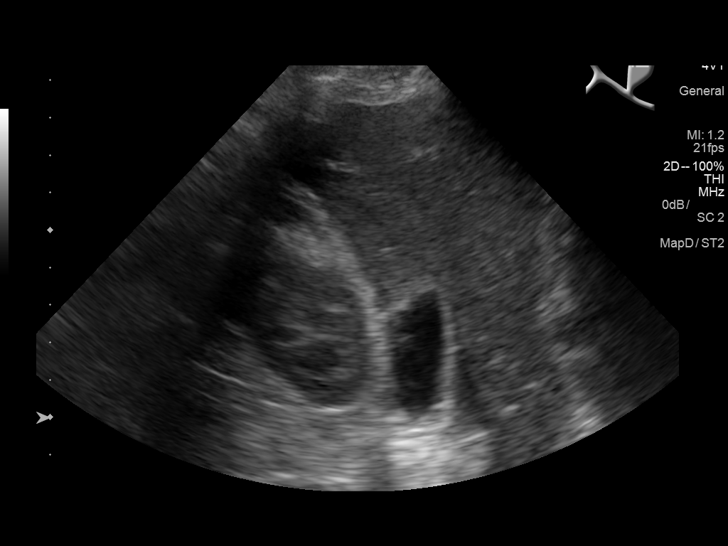
[im 24/53]
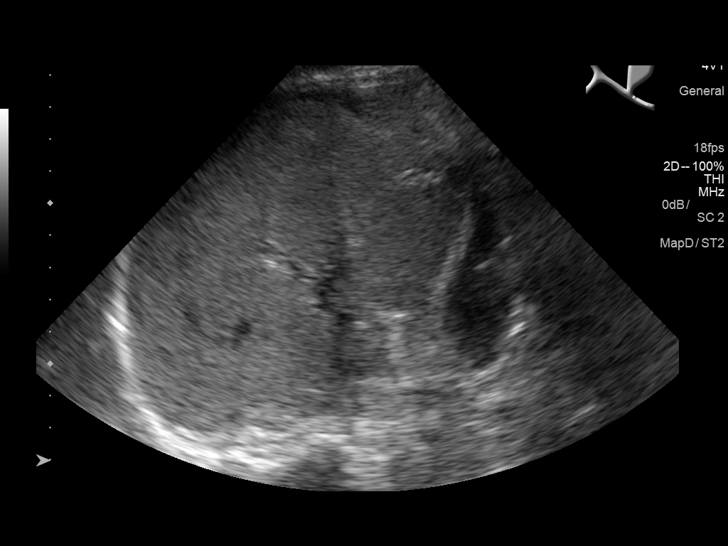
[im 29/53]
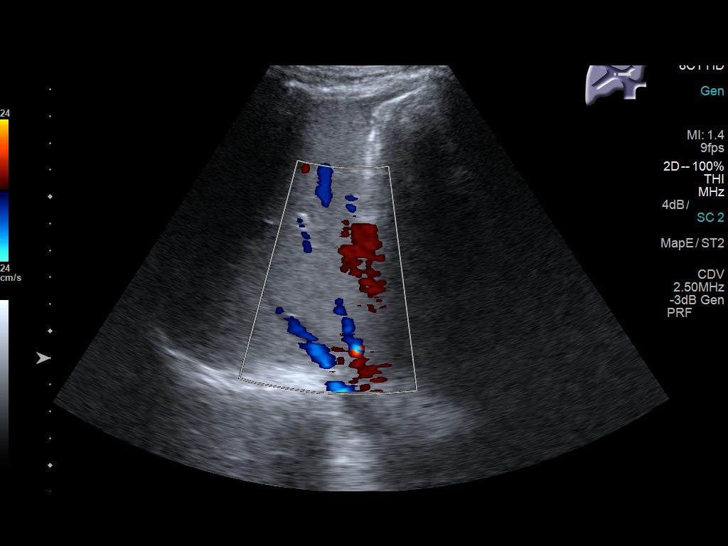
[im 33/53]
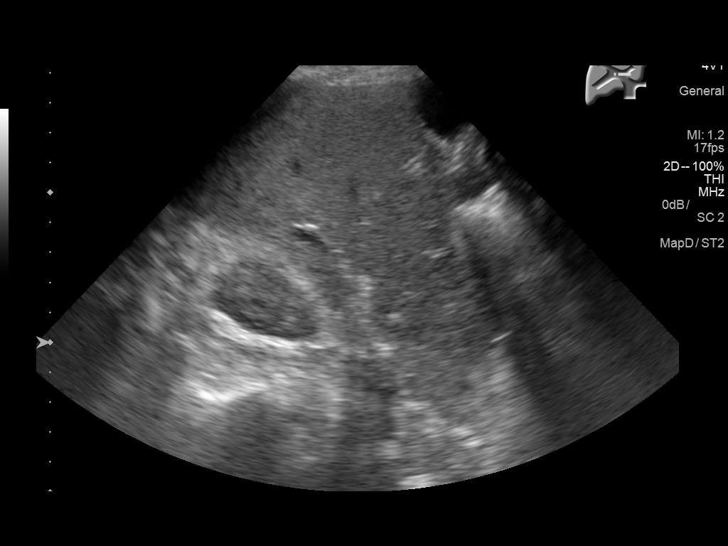
[im 35/53]
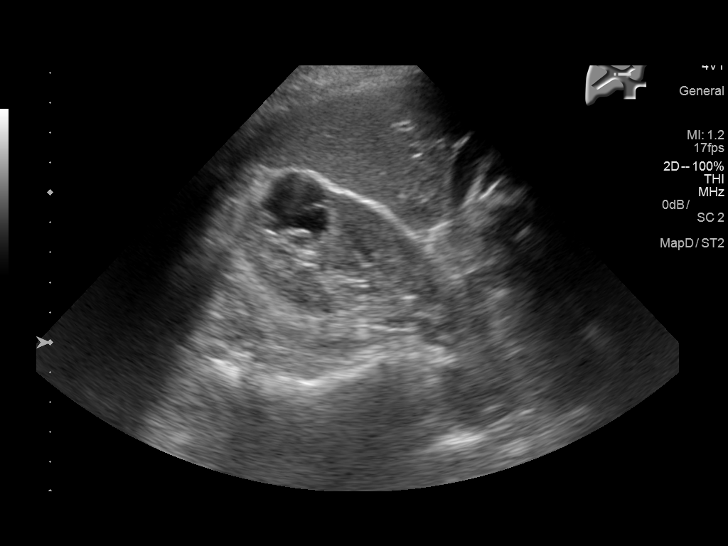
[im 40/53]
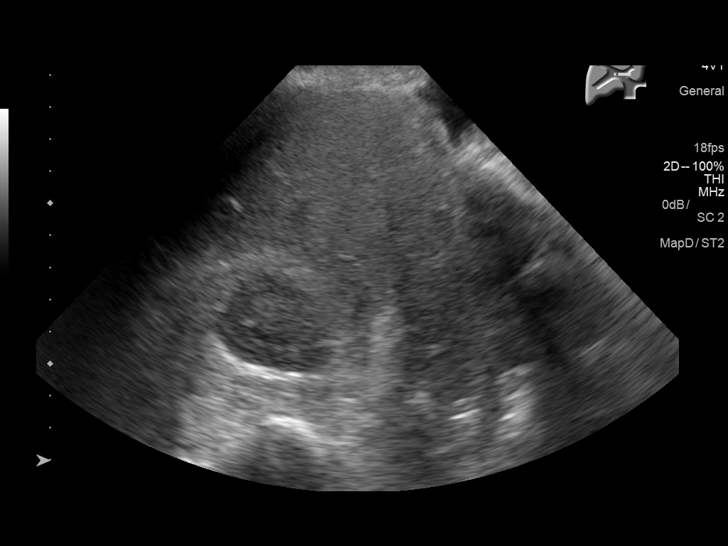
[im 44/53]
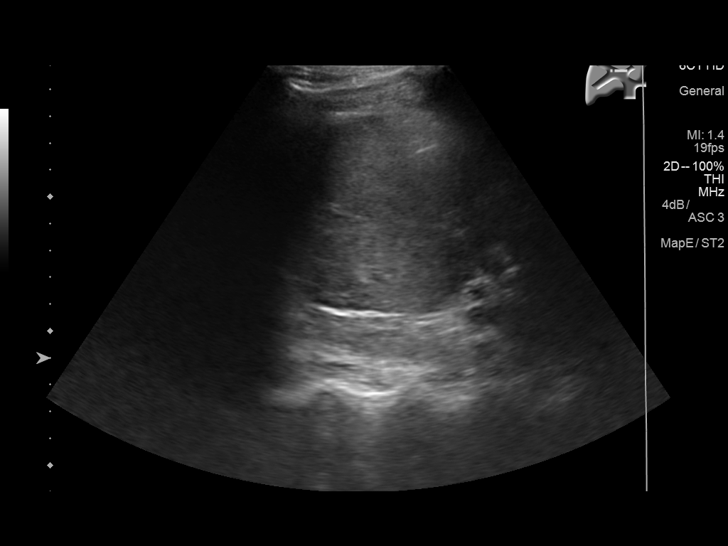
[im 48/53]
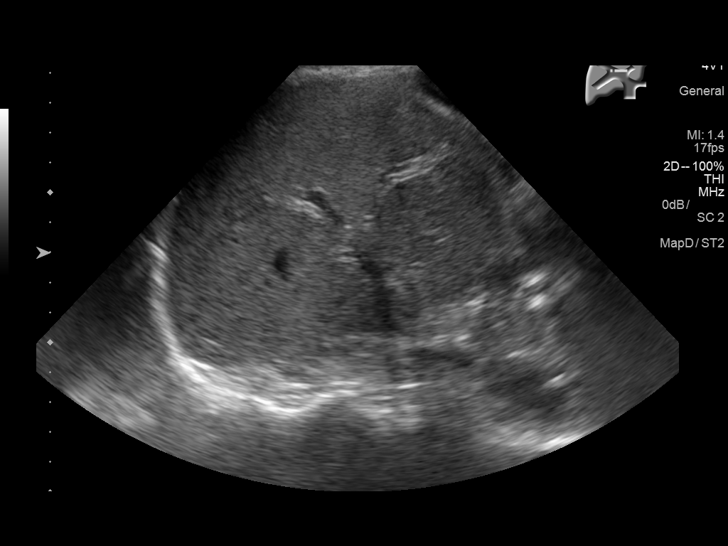
[im 53/53]
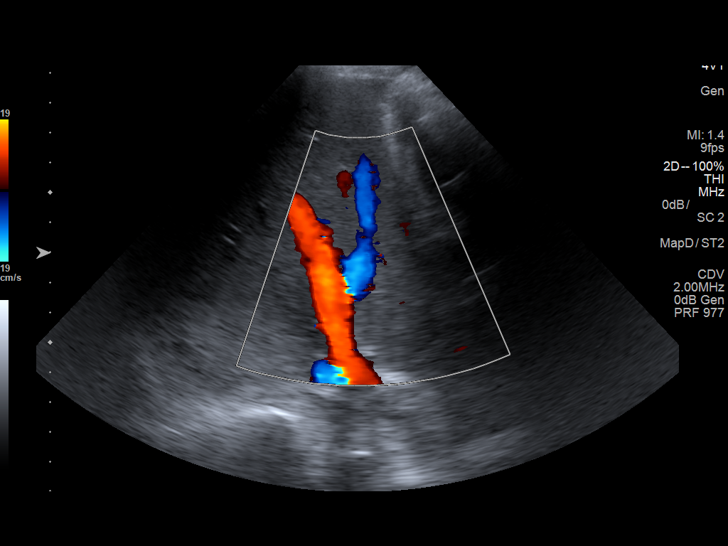

[14 of 25 positions shown; findings below may reference images not displayed]

FINDINGS: Gallbladder:

No gallstones or wall thickening visualized. No sonographic Murphy
sign noted by sonographer.

Common bile duct:

Diameter: 3.6 mm

Liver:

No focal lesion identified. Within normal limits in parenchymal
echogenicity. Portal vein is patent on color Doppler imaging with
normal direction of blood flow towards the liver.

Cystic lesion is noted within the right kidney, incompletely
evaluated on this exam.
IMPRESSION: No acute abnormality is noted in the right upper quadrant.

Cystic lesion within the right kidney incompletely evaluated on this
exam.
